# Patient Record
Sex: Female | Born: 1950 | Race: White | Hispanic: No | Smoking: Current every day smoker
Health system: Southern US, Community
[De-identification: ages and names within clinical notes are randomized; demographics above are authoritative.]

## PROBLEM LIST (undated history)

## (undated) DIAGNOSIS — R911 Solitary pulmonary nodule: Secondary | ICD-10-CM

## (undated) DIAGNOSIS — F419 Anxiety disorder, unspecified: Secondary | ICD-10-CM

## (undated) DIAGNOSIS — D509 Iron deficiency anemia, unspecified: Secondary | ICD-10-CM

## (undated) DIAGNOSIS — T7840XA Allergy, unspecified, initial encounter: Secondary | ICD-10-CM

## (undated) DIAGNOSIS — I10 Essential (primary) hypertension: Secondary | ICD-10-CM

## (undated) DIAGNOSIS — E785 Hyperlipidemia, unspecified: Secondary | ICD-10-CM

## (undated) DIAGNOSIS — R9431 Abnormal electrocardiogram [ECG] [EKG]: Secondary | ICD-10-CM

## (undated) DIAGNOSIS — F32A Depression, unspecified: Secondary | ICD-10-CM

## (undated) DIAGNOSIS — I4891 Unspecified atrial fibrillation: Secondary | ICD-10-CM

## (undated) DIAGNOSIS — F329 Major depressive disorder, single episode, unspecified: Secondary | ICD-10-CM

## (undated) DIAGNOSIS — D1730 Benign lipomatous neoplasm of skin and subcutaneous tissue of unspecified sites: Secondary | ICD-10-CM

## (undated) DIAGNOSIS — E538 Deficiency of other specified B group vitamins: Secondary | ICD-10-CM

## (undated) DIAGNOSIS — J432 Centrilobular emphysema: Secondary | ICD-10-CM

## (undated) DIAGNOSIS — J449 Chronic obstructive pulmonary disease, unspecified: Secondary | ICD-10-CM

## (undated) DIAGNOSIS — G5600 Carpal tunnel syndrome, unspecified upper limb: Secondary | ICD-10-CM

## (undated) DIAGNOSIS — Z1379 Encounter for other screening for genetic and chromosomal anomalies: Secondary | ICD-10-CM

## (undated) DIAGNOSIS — F1910 Other psychoactive substance abuse, uncomplicated: Secondary | ICD-10-CM

## (undated) DIAGNOSIS — C801 Malignant (primary) neoplasm, unspecified: Secondary | ICD-10-CM

## (undated) DIAGNOSIS — D649 Anemia, unspecified: Secondary | ICD-10-CM

## (undated) HISTORY — DX: Allergy, unspecified, initial encounter: T78.40XA

## (undated) HISTORY — DX: Abnormal electrocardiogram (ECG) (EKG): R94.31

## (undated) HISTORY — DX: Carpal tunnel syndrome, unspecified upper limb: G56.00

## (undated) HISTORY — DX: Chronic obstructive pulmonary disease, unspecified: J44.9

## (undated) HISTORY — DX: Deficiency of other specified B group vitamins: E53.8

## (undated) HISTORY — DX: Other psychoactive substance abuse, uncomplicated: F19.10

## (undated) HISTORY — PX: TUBAL LIGATION: SHX77

## (undated) HISTORY — DX: Iron deficiency anemia, unspecified: D50.9

## (undated) HISTORY — DX: Essential (primary) hypertension: I10

## (undated) HISTORY — PX: ABDOMINAL HYSTERECTOMY: SHX81

## (undated) HISTORY — DX: Encounter for other screening for genetic and chromosomal anomalies: Z13.79

## (undated) HISTORY — DX: Benign lipomatous neoplasm of skin and subcutaneous tissue of unspecified sites: D17.30

## (undated) HISTORY — DX: Anxiety disorder, unspecified: F41.9

## (undated) HISTORY — DX: Hyperlipidemia, unspecified: E78.5

## (undated) HISTORY — DX: Depression, unspecified: F32.A

## (undated) HISTORY — DX: Anemia, unspecified: D64.9

## (undated) HISTORY — DX: Major depressive disorder, single episode, unspecified: F32.9

## (undated) HISTORY — PX: BREAST EXCISIONAL BIOPSY: SUR124

---

## 2003-06-14 DIAGNOSIS — I10 Essential (primary) hypertension: Secondary | ICD-10-CM | POA: Insufficient documentation

## 2006-07-24 DIAGNOSIS — R0602 Shortness of breath: Secondary | ICD-10-CM | POA: Insufficient documentation

## 2009-08-25 DIAGNOSIS — F411 Generalized anxiety disorder: Secondary | ICD-10-CM | POA: Insufficient documentation

## 2009-08-25 DIAGNOSIS — F331 Major depressive disorder, recurrent, moderate: Secondary | ICD-10-CM | POA: Insufficient documentation

## 2010-10-11 DIAGNOSIS — D681 Hereditary factor XI deficiency: Secondary | ICD-10-CM | POA: Insufficient documentation

## 2010-10-11 DIAGNOSIS — K3189 Other diseases of stomach and duodenum: Secondary | ICD-10-CM | POA: Insufficient documentation

## 2010-10-11 DIAGNOSIS — F17200 Nicotine dependence, unspecified, uncomplicated: Secondary | ICD-10-CM | POA: Insufficient documentation

## 2010-10-11 DIAGNOSIS — E782 Mixed hyperlipidemia: Secondary | ICD-10-CM | POA: Insufficient documentation

## 2010-10-11 DIAGNOSIS — I10 Essential (primary) hypertension: Secondary | ICD-10-CM | POA: Insufficient documentation

## 2011-02-15 DIAGNOSIS — E538 Deficiency of other specified B group vitamins: Secondary | ICD-10-CM | POA: Insufficient documentation

## 2011-12-26 DIAGNOSIS — D1730 Benign lipomatous neoplasm of skin and subcutaneous tissue of unspecified sites: Secondary | ICD-10-CM | POA: Insufficient documentation

## 2012-02-13 DIAGNOSIS — J329 Chronic sinusitis, unspecified: Secondary | ICD-10-CM | POA: Insufficient documentation

## 2013-03-23 DIAGNOSIS — S42209A Unspecified fracture of upper end of unspecified humerus, initial encounter for closed fracture: Secondary | ICD-10-CM | POA: Insufficient documentation

## 2014-05-06 ENCOUNTER — Emergency Department: Payer: Self-pay | Admitting: Student

## 2014-05-06 LAB — CBC
HCT: 40 % (ref 35.0–47.0)
HGB: 13.2 g/dL (ref 12.0–16.0)
MCH: 27.6 pg (ref 26.0–34.0)
MCHC: 32.9 g/dL (ref 32.0–36.0)
MCV: 84 fL (ref 80–100)
Platelet: 331 10*3/uL (ref 150–440)
RBC: 4.77 10*6/uL (ref 3.80–5.20)
RDW: 13.4 % (ref 11.5–14.5)
WBC: 9.4 10*3/uL (ref 3.6–11.0)

## 2014-05-06 LAB — BASIC METABOLIC PANEL
Anion Gap: 8 (ref 7–16)
BUN: 6 mg/dL — AB (ref 7–18)
Calcium, Total: 8.8 mg/dL (ref 8.5–10.1)
Chloride: 105 mmol/L (ref 98–107)
Co2: 27 mmol/L (ref 21–32)
Creatinine: 0.79 mg/dL (ref 0.60–1.30)
Glucose: 101 mg/dL — ABNORMAL HIGH (ref 65–99)
OSMOLALITY: 277 (ref 275–301)
Potassium: 3.5 mmol/L (ref 3.5–5.1)
Sodium: 140 mmol/L (ref 136–145)

## 2014-05-06 LAB — URINALYSIS, COMPLETE
BLOOD: NEGATIVE
Bilirubin,UR: NEGATIVE
Glucose,UR: NEGATIVE mg/dL (ref 0–75)
Hyaline Cast: 20
NITRITE: NEGATIVE
Ph: 6 (ref 4.5–8.0)
Protein: 100
RBC,UR: 7 /HPF (ref 0–5)
SPECIFIC GRAVITY: 1.024 (ref 1.003–1.030)

## 2014-05-06 LAB — TROPONIN I: Troponin-I: <0.02 ng/mL

## 2015-05-17 DIAGNOSIS — R200 Anesthesia of skin: Secondary | ICD-10-CM | POA: Insufficient documentation

## 2015-05-27 DIAGNOSIS — G5603 Carpal tunnel syndrome, bilateral upper limbs: Secondary | ICD-10-CM | POA: Insufficient documentation

## 2015-07-28 DIAGNOSIS — R9431 Abnormal electrocardiogram [ECG] [EKG]: Secondary | ICD-10-CM | POA: Insufficient documentation

## 2016-04-13 ENCOUNTER — Other Ambulatory Visit: Payer: Self-pay | Admitting: Family Medicine

## 2016-04-13 DIAGNOSIS — E041 Nontoxic single thyroid nodule: Secondary | ICD-10-CM

## 2016-04-18 ENCOUNTER — Ambulatory Visit: Payer: Medicare Other

## 2016-04-25 ENCOUNTER — Ambulatory Visit
Admission: RE | Admit: 2016-04-25 | Discharge: 2016-04-25 | Disposition: A | Discharge status: Home / Self Care | Payer: Medicare Other | Source: Ambulatory Visit | Attending: Family Medicine | Admitting: Family Medicine

## 2016-04-25 DIAGNOSIS — Z8639 Personal history of other endocrine, nutritional and metabolic disease: Secondary | ICD-10-CM | POA: Diagnosis present

## 2016-04-25 DIAGNOSIS — E041 Nontoxic single thyroid nodule: Secondary | ICD-10-CM | POA: Insufficient documentation

## 2016-05-29 ENCOUNTER — Ambulatory Visit: Payer: Self-pay | Admitting: Family Medicine

## 2016-08-27 ENCOUNTER — Emergency Department
Admission: EM | Admit: 2016-08-27 | Discharge: 2016-08-27 | Discharge status: Absent without leave | Payer: Medicare Other

## 2016-09-24 DIAGNOSIS — F331 Major depressive disorder, recurrent, moderate: Secondary | ICD-10-CM | POA: Insufficient documentation

## 2016-10-16 ENCOUNTER — Inpatient Hospital Stay: Payer: Medicare Other | Attending: Oncology | Admitting: Oncology

## 2016-10-16 ENCOUNTER — Inpatient Hospital Stay: Payer: Medicare Other

## 2016-10-16 ENCOUNTER — Encounter: Payer: Self-pay | Admitting: Oncology

## 2016-10-16 VITALS — BP 152/78 | HR 98 | Temp 97.6°F | Wt 165.0 lb

## 2016-10-16 DIAGNOSIS — D649 Anemia, unspecified: Secondary | ICD-10-CM

## 2016-10-16 DIAGNOSIS — Z8041 Family history of malignant neoplasm of ovary: Secondary | ICD-10-CM

## 2016-10-16 DIAGNOSIS — E785 Hyperlipidemia, unspecified: Secondary | ICD-10-CM | POA: Insufficient documentation

## 2016-10-16 DIAGNOSIS — I10 Essential (primary) hypertension: Secondary | ICD-10-CM | POA: Diagnosis not present

## 2016-10-16 DIAGNOSIS — E538 Deficiency of other specified B group vitamins: Secondary | ICD-10-CM | POA: Insufficient documentation

## 2016-10-16 DIAGNOSIS — J449 Chronic obstructive pulmonary disease, unspecified: Secondary | ICD-10-CM

## 2016-10-16 DIAGNOSIS — J432 Centrilobular emphysema: Secondary | ICD-10-CM | POA: Insufficient documentation

## 2016-10-16 DIAGNOSIS — F419 Anxiety disorder, unspecified: Secondary | ICD-10-CM | POA: Insufficient documentation

## 2016-10-16 DIAGNOSIS — F329 Major depressive disorder, single episode, unspecified: Secondary | ICD-10-CM | POA: Insufficient documentation

## 2016-10-16 DIAGNOSIS — R5383 Other fatigue: Secondary | ICD-10-CM | POA: Diagnosis not present

## 2016-10-16 DIAGNOSIS — D509 Iron deficiency anemia, unspecified: Secondary | ICD-10-CM | POA: Diagnosis not present

## 2016-10-16 DIAGNOSIS — G5600 Carpal tunnel syndrome, unspecified upper limb: Secondary | ICD-10-CM | POA: Diagnosis not present

## 2016-10-16 DIAGNOSIS — Z803 Family history of malignant neoplasm of breast: Secondary | ICD-10-CM

## 2016-10-16 DIAGNOSIS — J439 Emphysema, unspecified: Secondary | ICD-10-CM | POA: Insufficient documentation

## 2016-10-16 DIAGNOSIS — Z129 Encounter for screening for malignant neoplasm, site unspecified: Secondary | ICD-10-CM

## 2016-10-16 DIAGNOSIS — Z5181 Encounter for therapeutic drug level monitoring: Secondary | ICD-10-CM

## 2016-10-16 DIAGNOSIS — Z79899 Other long term (current) drug therapy: Secondary | ICD-10-CM | POA: Insufficient documentation

## 2016-10-16 DIAGNOSIS — Z789 Other specified health status: Secondary | ICD-10-CM

## 2016-10-16 DIAGNOSIS — F17210 Nicotine dependence, cigarettes, uncomplicated: Secondary | ICD-10-CM | POA: Insufficient documentation

## 2016-10-16 HISTORY — DX: Anemia, unspecified: D64.9

## 2016-10-16 HISTORY — DX: Iron deficiency anemia, unspecified: D50.9

## 2016-10-16 LAB — COMPREHENSIVE METABOLIC PANEL
ALT: 14 U/L (ref 14–54)
AST: 20 U/L (ref 15–41)
Albumin: 4.4 g/dL (ref 3.5–5.0)
Alkaline Phosphatase: 83 U/L (ref 38–126)
Anion gap: 7 (ref 5–15)
BILIRUBIN TOTAL: 0.6 mg/dL (ref 0.3–1.2)
BUN: 10 mg/dL (ref 6–20)
CALCIUM: 9.5 mg/dL (ref 8.9–10.3)
CO2: 27 mmol/L (ref 22–32)
CREATININE: 0.78 mg/dL (ref 0.44–1.00)
Chloride: 103 mmol/L (ref 101–111)
GFR calc Af Amer: >60 mL/min (ref >60)
Glucose, Bld: 102 mg/dL — ABNORMAL HIGH (ref 65–99)
Potassium: 3.9 mmol/L (ref 3.5–5.1)
Sodium: 137 mmol/L (ref 135–145)
TOTAL PROTEIN: 7.9 g/dL (ref 6.5–8.1)

## 2016-10-16 LAB — CBC WITH DIFFERENTIAL/PLATELET
BASOS PCT: 1 %
Basophils Absolute: 0.1 10*3/uL (ref 0–0.1)
EOS ABS: 0.1 10*3/uL (ref 0–0.7)
Eosinophils Relative: 1 %
HEMATOCRIT: 36.7 % (ref 35.0–47.0)
HEMOGLOBIN: 12.4 g/dL (ref 12.0–16.0)
Lymphocytes Relative: 23 %
Lymphs Abs: 2.1 10*3/uL (ref 1.0–3.6)
MCH: 27.1 pg (ref 26.0–34.0)
MCHC: 33.9 g/dL (ref 32.0–36.0)
MCV: 79.9 fL — ABNORMAL LOW (ref 80.0–100.0)
MONOS PCT: 8 %
Monocytes Absolute: 0.7 10*3/uL (ref 0.2–0.9)
NEUTROS ABS: 5.9 10*3/uL (ref 1.4–6.5)
NEUTROS PCT: 67 %
Platelets: 351 10*3/uL (ref 150–440)
RBC: 4.59 MIL/uL (ref 3.80–5.20)
RDW: 14.9 % — ABNORMAL HIGH (ref 11.5–14.5)
WBC: 8.8 10*3/uL (ref 3.6–11.0)

## 2016-10-16 LAB — FERRITIN: Ferritin: 12 ng/mL (ref 11–307)

## 2016-10-16 LAB — IRON AND TIBC
IRON: 49 ug/dL (ref 28–170)
Saturation Ratios: 12 % (ref 10.4–31.8)
TIBC: 412 ug/dL (ref 250–450)
UIBC: 363 ug/dL

## 2016-10-16 NOTE — Progress Notes (Addendum)
[facility]  Hematology/Oncology Consult note [facility] Telephone:([phone] [phone]  Patient Care Team: Cecile Sheerer, MD as PCP - General (Family Medicine)  CHIEF COMPLAINTS/PURPOSE OF CONSULTATION: I have low hemoglobin and low iron level.    No history exists.    HISTORY OF PRESENTING ILLNESS: Elizabeth [name] 66 y.o. female PMH HLD, COPD, vitamin B12 deficiency, depression who is referred by Dr.Shapely-Quinn for evaluation and treatment of iron deficiency anemia. Patient reports that 3 months ago she felt "sick" and she was told that her blood counts were low and later she was informed that her iron levels are low too. She started taking over-the-counter iron supplementation for a few months. Patient's primary care doctor recently checked her CBC and iron test. Lab reports from [facility] showed WBC 9.6 hemoglobin 11.4 platelet 330, MCV 83. Iron 25 TIBC 382, saturation 7% ferritin 9. TSH 1.85 B12 631. She also had stool occult done in May 2018 which were negative. The patient denies any change of bowel habits black stool or blood in the stool. She denies any weight loss fever or night sweats. She feels weak and lack of exercise endurance.  Patient reports that she has a long history of vitamin B12 deficiency. She gets parenteral vitamin shots periodically, managed by PCP and was given by her daughter.  Patient reports that she has had hysterectomy at age of 16. She hasn't had her colonoscopy screening done yet. She denies any bleeding events though she noted easy bruising. She denies any symptoms. He reports that her mother passed away at age of 32 with ovarian cancer and for some unknown reason autopsy was requested at that time and she was told her mom also had breast cancer. Denies any other family cancer history. She is a active smoker, she denies alcohol use. She consume vegetarian diet and brew and drink tea every  day. The  Review of Systems  Constitutional: Positive for fatigue.  HENT:  Negative.   Eyes: Negative.   Respiratory: Negative.   Cardiovascular: Negative.   Gastrointestinal: Negative.   Endocrine: Negative.   Genitourinary: Negative.    Musculoskeletal: Negative.   Skin: Negative.   Neurological: Negative.   Hematological: Negative.   Psychiatric/Behavioral: Positive for depression.    MEDICAL HISTORY: Past Medical History:  Diagnosis Date  . Allergy   . Anxiety   . B12 deficiency   . Carpal tunnel syndrome   . COPD (chronic obstructive pulmonary disease) ([place])   . Depression   . Hyperlipidemia   . Hypertension   . Iron deficiency anemia 10/16/2016  . Lipoma of skin   . Normocytic anemia 10/16/2016  . QT prolongation   . Substance abuse     SURGICAL HISTORY: Past Surgical History:  Procedure Laterality Date  . ABDOMINAL HYSTERECTOMY      SOCIAL HISTORY: Social History   Social History  . Marital status: Unknown    Spouse name: N/A  . Number of children: N/A  . Years of education: N/A   Occupational History  . Not on file.   Social History Main Topics  . Smoking status: Current Every Day Smoker    Packs/day: 1.00    Years: 50.00    Types: Cigarettes  . Smokeless tobacco: Never Used  . Alcohol use No  . Drug use: No  . Sexual activity: Not Currently   Other Topics Concern  . Not on file   Social History Narrative  . No narrative on file  FAMILY HISTORY Family History  Problem Relation Age of Onset  . Ovarian cancer Mother   . Breast cancer Mother   . Stroke Father     ALLERGIES:  has no allergies on file.  MEDICATIONS:  Current Outpatient Prescriptions  Medication Sig Dispense Refill  . albuterol (PROVENTIL HFA;VENTOLIN HFA) 108 (90 Base) MCG/ACT inhaler Inhale into the lungs.    Marland Kitchen albuterol (PROVENTIL) (2.5 MG/3ML) 0.083% nebulizer solution Inhale into the lungs.    Marland Kitchen amLODipine (NORVASC) 10 MG tablet take 1 tablet by mouth once  daily    . budesonide-formoterol (SYMBICORT) 160-4.5 MCG/ACT inhaler Inhale into the lungs.    . clonazePAM (KLONOPIN) 0.5 MG tablet Take 0.5 mg by mouth.    . cyanocobalamin (,VITAMIN B-12,) 1000 MCG/ML injection Inject into the muscle.    . fluticasone (FLONASE) 50 MCG/ACT nasal spray   1  . lisinopril (PRINIVIL,ZESTRIL) 10 MG tablet Take by mouth.    . Multiple Vitamin (MULTI-VITAMINS) TABS Take by mouth.    Marland Kitchen omeprazole (PRILOSEC) 20 MG capsule Take by mouth.    . QUEtiapine (SEROQUEL) 25 MG tablet Take by mouth.    . sertraline (ZOLOFT) 100 MG tablet Take by mouth.    . simvastatin (ZOCOR) 80 MG tablet Take by mouth.    . spironolactone (ALDACTONE) 25 MG tablet Take by mouth.    . tiotropium (SPIRIVA HANDIHALER) 18 MCG inhalation capsule inhale contents of 1 capsule by mouth once daily    . traZODone (DESYREL) 50 MG tablet Take by mouth.     No current facility-administered medications for this visit.     PHYSICAL EXAMINATION:  ECOG PERFORMANCE STATUS: 1 - Symptomatic but completely ambulatory  Vitals:   10/16/16 0944  BP: (!) 152/78  Pulse: 98  Temp: 97.6 F (36.4 C)   Filed Weights   10/16/16 0944  Weight: 165 lb (74.8 kg)     Physical Exam  GENERAL: No distress, well nourished, obese.  SKIN:  No rashes or significant lesions  HEAD: Normocephalic, No masses, lesions, tenderness or abnormalities  EYES: Conjunctiva are pink and non-injected  ENT: External ears normal ,lips , buccal mucosa, and tongue normal and mucous membranes are moist  LYMPH: No palpable lymphadenopathy  BREAST:Normal without mass, skin or nipple changes or axillary nodes,  LUNGS: Clear to auscultation, no crackles or wheezes HEART: Regular rate & rhythm, no murmurs, no gallops, S1 normal and S2 normal  ABDOMEN: Abdomen soft, obese, non-tender, normal bowel sounds, no masses or organomegaly and no hepatosplenomegaly  MSK: No CVA tenderness and no tenderness on percussion of the back or rib  cage. EXTREMITIES: No edema, no skin discoloration or tenderness NEURO: Alert & oriented, no focal motor/sensory deficits.   LABORATORY DATA: I have personally reviewed the data as listed: Lab reports from [facility] showed WBC 9.6 hemoglobin 11.4 platelet 330, MCV 83. Iron 25 TIBC 382, saturation 7% ferritin 9. TSH 1.85 B12 631. She also had stool occult done in May 2018 which were negative  Appointment on 10/16/2016  Component Date Value Ref Range Status  . WBC 10/16/2016 8.8  3.6 - 11.0 K/uL Final  . RBC 10/16/2016 4.59  3.80 - 5.20 MIL/uL Final  . Hemoglobin 10/16/2016 12.4  12.0 - 16.0 g/dL Final  . HCT 10/16/2016 36.7  35.0 - 47.0 % Final  . MCV 10/16/2016 79.9* 80.0 - 100.0 fL Final  . MCH 10/16/2016 27.1  26.0 - 34.0 pg Final  . MCHC 10/16/2016 33.9  32.0 - 36.0 g/dL  Final  . RDW 10/16/2016 14.9* 11.5 - 14.5 % Final  . Platelets 10/16/2016 351  150 - 440 K/uL Final  . Neutrophils Relative % 10/16/2016 67  % Final  . Neutro Abs 10/16/2016 5.9  1.4 - 6.5 K/uL Final  . Lymphocytes Relative 10/16/2016 23  % Final  . Lymphs Abs 10/16/2016 2.1  1.0 - 3.6 K/uL Final  . Monocytes Relative 10/16/2016 8  % Final  . Monocytes Absolute 10/16/2016 0.7  0.2 - 0.9 K/uL Final  . Eosinophils Relative 10/16/2016 1  % Final  . Eosinophils Absolute 10/16/2016 0.1  0 - 0.7 K/uL Final  . Basophils Relative 10/16/2016 1  % Final  . Basophils Absolute 10/16/2016 0.1  0 - 0.1 K/uL Final    ASSESSMENT/PLAN  # Microcytic Anemia # Iron deficiency anemia  # History of vitamin B12 deficiency # Fatigue # Vegetarian diet # Chronic proton pump inhibitor use.  # Family history of ovarian and breast cancer # Age appropriate cancer screen  Plan:  # Discussed with patient about her iron deficiency anemia diagnosis and options of treatment, including oral prescription strength iron supplement which may take a few months to replete her iron store or IV iron infusion. Patient prefers IV iron infusion as  she feels very fatigue and hope her iron store can be replete fast. She is made aware of the rare but severe allergic reactions of intravenous iron supplementation and is willing to proceed.  I feel this is a reasonable choice given that she has tried over the counter iron supplementations without any improvement, and her chronic PPI use and daily tea consumption will definitely interfere the consumption of the iron.  # She also appears to have long standing B12 deficiency which requires B12 shots periodically. Most recent B12 level is normal. Will check anti parietal antibody, and intrinsic factor antibody to rule out pernicious anemia.  Check CMP as well.  # I discussed with patient regarding the importance of colonoscopy, especially now she shows signs of iron deficiency, colon cancer or other etiology for chronic blood loss need to be ruled out. Patient reports her hesitancy due to a bad experience from her co worker. Patient also reports that her PCP has also discussed with her about the necessarily of colonoscopy and she has declined. I offer to refer to a GI specialist and have a discussion about the procedure risk and benefit of colonoscopy. Patient prefers to revisit this topic at next visit.  # Her family history revealed both ovarian and breast cancer, per patient no one else in the family has cancer. No genetic test was done. She will also need mammogram screen. At this point, she is quite overwhelmed with the information. Will continue counseling at next visit.  Plan IV iron supplement with Feraheme for 2 doses.  Return of visit in 6 weeks with CBC, iron panel repeated a few days before clinic.   Orders Placed This Encounter  Procedures  . Comprehensive metabolic panel    Standing Status:   Future    Number of Occurrences:   1    Standing Expiration Date:   10/16/2017  . Anti-parietal antibody    Standing Status:   Future    Number of Occurrences:   1    Standing Expiration Date:    10/16/2017  . Intrinsic Factor Antibodies  . Iron and TIBC    Standing Status:   Future    Number of Occurrences:   1    Standing  Expiration Date:   10/16/2017  . Ferritin    Standing Status:   Future    Number of Occurrences:   1    Standing Expiration Date:   10/16/2017  . CBC with Differential/Platelet    Standing Status:   Future    Number of Occurrences:   1    Standing Expiration Date:   10/16/2017  . Intrinsic Factor Antibodies    Standing Status:   Future    Number of Occurrences:   1    Standing Expiration Date:   10/16/2017    All questions were answered. The patient knows to call the clinic with any problems, questions or concerns.  Thank you for this kind referral and the opportunity to participate in the care of this patient.   Total face to face encounter time for this patient visit was 60 min. >50% of the time was  spent in counseling and coordination of care.   Earlie Server MD PhD [facility] [phone] Phone: [phone] 10/15/2016

## 2016-10-16 NOTE — Addendum Note (Signed)
Addended by: Earlie Server on: 10/16/2016 01:47 PM   Modules accepted: Miquel Dunn

## 2016-10-17 LAB — INTRINSIC FACTOR ANTIBODIES
INTRINSIC FACTOR: 1 [AU]/ml (ref 0.0–1.1)
Intrinsic Factor: 1.1 AU/mL (ref 0.0–1.1)

## 2016-10-19 ENCOUNTER — Inpatient Hospital Stay: Payer: Medicare Other

## 2016-10-19 VITALS — BP 150/69 | HR 92

## 2016-10-19 DIAGNOSIS — D509 Iron deficiency anemia, unspecified: Secondary | ICD-10-CM | POA: Diagnosis not present

## 2016-10-19 LAB — ANTI-PARIETAL ANTIBODY: Parietal Cell Antibody-IgG: 23.4 Units — ABNORMAL HIGH (ref 0.0–20.0)

## 2016-10-19 MED ORDER — SODIUM CHLORIDE 0.9 % IV SOLN
510.0000 mg | Freq: Once | INTRAVENOUS | Status: AC
  Administered 2016-10-19: 510 mg via INTRAVENOUS
  Filled 2016-10-19: qty 17

## 2016-10-19 MED ORDER — SODIUM CHLORIDE 0.9 % IV SOLN
Freq: Once | INTRAVENOUS | Status: AC
  Administered 2016-10-19: 14:00:00 via INTRAVENOUS
  Filled 2016-10-19: qty 1000

## 2016-10-20 ENCOUNTER — Encounter: Payer: Self-pay | Admitting: Oncology

## 2016-10-23 ENCOUNTER — Telehealth: Payer: Self-pay | Admitting: *Deleted

## 2016-10-23 NOTE — Telephone Encounter (Signed)
Just look her up. She only has one IV iron scheduled. There was miscommunication between me and the scheduler. Can you tell patient that she will be scheduled for a second dose of IV iron and also she does not need to take oral iron. Thank you.

## 2016-10-23 NOTE — Telephone Encounter (Signed)
Patient advised to stop OTC oral iron and that she will be getting a call form scheduler to get her second iron infusion

## 2016-10-23 NOTE — Telephone Encounter (Signed)
Patient called inquiring if she is to continue OTC oral iron, also asking if she is to only get 1 dose of IV iron. She is not scheduled for a second dose, Per office note she is to get 2 doses of IV iron. The check out note did not say to schedule her for a second infusion. Please advise

## 2016-10-26 ENCOUNTER — Inpatient Hospital Stay: Payer: Medicare Other

## 2016-10-26 VITALS — BP 115/73 | HR 84 | Temp 98.4°F | Resp 20

## 2016-10-26 DIAGNOSIS — D509 Iron deficiency anemia, unspecified: Secondary | ICD-10-CM

## 2016-10-26 MED ORDER — SODIUM CHLORIDE 0.9 % IV SOLN
Freq: Once | INTRAVENOUS | Status: AC
  Administered 2016-10-26: 14:00:00 via INTRAVENOUS
  Filled 2016-10-26: qty 1000

## 2016-10-26 MED ORDER — SODIUM CHLORIDE 0.9 % IV SOLN
510.0000 mg | Freq: Once | INTRAVENOUS | Status: AC
  Administered 2016-10-26: 510 mg via INTRAVENOUS
  Filled 2016-10-26: qty 17

## 2016-11-23 ENCOUNTER — Inpatient Hospital Stay: Payer: Medicare Other | Attending: Oncology

## 2016-11-23 ENCOUNTER — Other Ambulatory Visit: Payer: Self-pay | Admitting: *Deleted

## 2016-11-23 ENCOUNTER — Encounter: Payer: Self-pay | Admitting: Oncology

## 2016-11-23 DIAGNOSIS — I10 Essential (primary) hypertension: Secondary | ICD-10-CM | POA: Diagnosis not present

## 2016-11-23 DIAGNOSIS — F17210 Nicotine dependence, cigarettes, uncomplicated: Secondary | ICD-10-CM | POA: Diagnosis not present

## 2016-11-23 DIAGNOSIS — J449 Chronic obstructive pulmonary disease, unspecified: Secondary | ICD-10-CM | POA: Diagnosis not present

## 2016-11-23 DIAGNOSIS — K219 Gastro-esophageal reflux disease without esophagitis: Secondary | ICD-10-CM | POA: Insufficient documentation

## 2016-11-23 DIAGNOSIS — Z79899 Other long term (current) drug therapy: Secondary | ICD-10-CM | POA: Diagnosis not present

## 2016-11-23 DIAGNOSIS — Z8041 Family history of malignant neoplasm of ovary: Secondary | ICD-10-CM | POA: Insufficient documentation

## 2016-11-23 DIAGNOSIS — F419 Anxiety disorder, unspecified: Secondary | ICD-10-CM | POA: Insufficient documentation

## 2016-11-23 DIAGNOSIS — E785 Hyperlipidemia, unspecified: Secondary | ICD-10-CM | POA: Diagnosis not present

## 2016-11-23 DIAGNOSIS — E538 Deficiency of other specified B group vitamins: Secondary | ICD-10-CM

## 2016-11-23 DIAGNOSIS — Z803 Family history of malignant neoplasm of breast: Secondary | ICD-10-CM | POA: Diagnosis not present

## 2016-11-23 DIAGNOSIS — D509 Iron deficiency anemia, unspecified: Secondary | ICD-10-CM | POA: Insufficient documentation

## 2016-11-23 DIAGNOSIS — G5600 Carpal tunnel syndrome, unspecified upper limb: Secondary | ICD-10-CM | POA: Diagnosis not present

## 2016-11-23 DIAGNOSIS — F329 Major depressive disorder, single episode, unspecified: Secondary | ICD-10-CM | POA: Insufficient documentation

## 2016-11-23 DIAGNOSIS — D508 Other iron deficiency anemias: Secondary | ICD-10-CM

## 2016-11-23 LAB — CBC WITH DIFFERENTIAL/PLATELET
BASOS ABS: 0 10*3/uL (ref 0–0.1)
BASOS PCT: 1 %
EOS ABS: 0.1 10*3/uL (ref 0–0.7)
Eosinophils Relative: 2 %
HEMATOCRIT: 38.1 % (ref 35.0–47.0)
HEMOGLOBIN: 13.1 g/dL (ref 12.0–16.0)
Lymphocytes Relative: 32 %
Lymphs Abs: 2.2 10*3/uL (ref 1.0–3.6)
MCH: 27.9 pg (ref 26.0–34.0)
MCHC: 34.4 g/dL (ref 32.0–36.0)
MCV: 81.1 fL (ref 80.0–100.0)
Monocytes Absolute: 0.6 10*3/uL (ref 0.2–0.9)
Monocytes Relative: 8 %
NEUTROS ABS: 4 10*3/uL (ref 1.4–6.5)
NEUTROS PCT: 57 %
Platelets: 280 10*3/uL (ref 150–440)
RBC: 4.69 MIL/uL (ref 3.80–5.20)
RDW: 16.4 % — ABNORMAL HIGH (ref 11.5–14.5)
WBC: 6.9 10*3/uL (ref 3.6–11.0)

## 2016-11-23 LAB — IRON AND TIBC
IRON: 74 ug/dL (ref 28–170)
SATURATION RATIOS: 30 % (ref 10.4–31.8)
TIBC: 249 ug/dL — AB (ref 250–450)
UIBC: 175 ug/dL

## 2016-11-23 LAB — VITAMIN B12: VITAMIN B 12: 673 pg/mL (ref 180–914)

## 2016-11-23 LAB — FERRITIN: FERRITIN: 305 ng/mL (ref 11–307)

## 2016-11-27 ENCOUNTER — Encounter: Payer: Self-pay | Admitting: Oncology

## 2016-11-27 ENCOUNTER — Inpatient Hospital Stay: Payer: Medicare Other

## 2016-11-27 ENCOUNTER — Other Ambulatory Visit: Payer: Medicare Other

## 2016-11-27 ENCOUNTER — Other Ambulatory Visit: Payer: Self-pay | Admitting: *Deleted

## 2016-11-27 ENCOUNTER — Inpatient Hospital Stay (HOSPITAL_BASED_OUTPATIENT_CLINIC_OR_DEPARTMENT_OTHER): Payer: Medicare Other | Admitting: Oncology

## 2016-11-27 VITALS — BP 121/66 | HR 86 | Temp 98.0°F | Wt 165.0 lb

## 2016-11-27 DIAGNOSIS — Z803 Family history of malignant neoplasm of breast: Secondary | ICD-10-CM

## 2016-11-27 DIAGNOSIS — K219 Gastro-esophageal reflux disease without esophagitis: Secondary | ICD-10-CM | POA: Diagnosis not present

## 2016-11-27 DIAGNOSIS — Z8041 Family history of malignant neoplasm of ovary: Secondary | ICD-10-CM

## 2016-11-27 DIAGNOSIS — D509 Iron deficiency anemia, unspecified: Secondary | ICD-10-CM

## 2016-11-27 DIAGNOSIS — Z79899 Other long term (current) drug therapy: Secondary | ICD-10-CM

## 2016-11-27 DIAGNOSIS — D510 Vitamin B12 deficiency anemia due to intrinsic factor deficiency: Secondary | ICD-10-CM | POA: Insufficient documentation

## 2016-11-27 DIAGNOSIS — F17210 Nicotine dependence, cigarettes, uncomplicated: Secondary | ICD-10-CM

## 2016-11-27 DIAGNOSIS — E538 Deficiency of other specified B group vitamins: Secondary | ICD-10-CM | POA: Diagnosis not present

## 2016-11-27 DIAGNOSIS — Z129 Encounter for screening for malignant neoplasm, site unspecified: Secondary | ICD-10-CM

## 2016-11-27 LAB — URINALYSIS, COMPLETE (UACMP) WITH MICROSCOPIC
BACTERIA UA: NONE SEEN
Bilirubin Urine: NEGATIVE
Glucose, UA: NEGATIVE mg/dL
Hgb urine dipstick: NEGATIVE
Ketones, ur: NEGATIVE mg/dL
Leukocytes, UA: NEGATIVE
NITRITE: NEGATIVE
Protein, ur: NEGATIVE mg/dL
SPECIFIC GRAVITY, URINE: 1.019 (ref 1.005–1.030)
pH: 6 (ref 5.0–8.0)

## 2016-11-27 NOTE — Progress Notes (Signed)
Patient here today for follow up.   

## 2016-11-27 NOTE — Progress Notes (Signed)
[facility]  Hematology/Oncology Consult note [facility] Telephone:([phone] [phone]  Patient Care Team: Cecile Sheerer, MD as PCP - General (Family Medicine)  CHIEF COMPLAINTS/PURPOSE OF CONSULTATION: I have low hemoglobin and low iron level.    No history exists.    HISTORY OF PRESENTING ILLNESS: Elizabeth [name] 66 y.o. female PMH HLD, COPD, vitamin B12 deficiency, depression who is referred by Dr.Shapely-Quinn for evaluation and treatment of iron deficiency anemia. Patient reports that 3 months ago she felt "sick" and she was told that her blood counts were low and later she was informed that her iron levels are low too. She started taking over-the-counter iron supplementation for a few months. Patient's primary care doctor recently checked her CBC and iron test. Lab reports from [facility] showed WBC 9.6 hemoglobin 11.4 platelet 330, MCV 83. Iron 25 TIBC 382, saturation 7% ferritin 9. TSH 1.85 B12 631. She also had stool occult done in May 2018 which were negative. The patient denies any change of bowel habits black stool or blood in the stool. She denies any weight loss fever or night sweats. She feels weak and lack of exercise endurance.  Patient reports that she has a long history of vitamin B12 deficiency. She gets parenteral vitamin shots periodically, managed by PCP and was given by her daughter.  Patient reports that she has had hysterectomy at age of 43. She hasn't had her colonoscopy screening done yet. She denies any bleeding events though she noted easy bruising. She denies any symptoms. He reports that her mother passed away at age of 64 with ovarian cancer and for some unknown reason autopsy was requested at that time and she was told her mom also had breast cancer. Denies any other family cancer history. She is a active smoker, she denies alcohol use. She consume vegetarian diet and brew and drink tea every  day. The  INTERVAL HISTORY Patient presents to discuss about the results. No new complaints. During the interval she got IV Feraheme infusion tolerated well. She reports that her energy has much improved, and her fingernail changes have resolved.. She also has better mood  Review of Systems  HENT:  Negative.   Eyes: Negative.   Respiratory: Negative.   Cardiovascular: Negative.   Gastrointestinal: Negative.   Endocrine: Negative.   Genitourinary: Negative.    Musculoskeletal: Negative.   Skin: Negative.   Neurological: Negative.   Hematological: Negative.   Psychiatric/Behavioral: Negative.     MEDICAL HISTORY: Past Medical History:  Diagnosis Date  . Allergy   . Anxiety   . B12 deficiency   . Carpal tunnel syndrome   . COPD (chronic obstructive pulmonary disease) ([place])   . Depression   . Hyperlipidemia   . Hypertension   . Iron deficiency anemia 10/16/2016  . Lipoma of skin   . Normocytic anemia 10/16/2016  . QT prolongation   . Substance abuse     SURGICAL HISTORY: Past Surgical History:  Procedure Laterality Date  . ABDOMINAL HYSTERECTOMY    . BREAST EXCISIONAL BIOPSY Left   . CESAREAN SECTION    . TUBAL LIGATION      SOCIAL HISTORY: Social History   Social History  . Marital status: Unknown    Spouse name: N/A  . Number of children: N/A  . Years of education: N/A   Occupational History  . Not on file.   Social History Main Topics  . Smoking status: Current Every Day Smoker  Packs/day: 1.00    Years: 50.00    Types: Cigarettes  . Smokeless tobacco: Never Used  . Alcohol use No  . Drug use: No  . Sexual activity: Not Currently   Other Topics Concern  . Not on file   Social History Narrative  . No narrative on file    FAMILY HISTORY Family History  Problem Relation Age of Onset  . Ovarian cancer Mother   . Breast cancer Mother   . Stroke Father     ALLERGIES:  is allergic to levofloxacin; amoxicillin-pot clavulanate; and  tetracycline.  MEDICATIONS:  Current Outpatient Prescriptions  Medication Sig Dispense Refill  . albuterol (PROVENTIL HFA;VENTOLIN HFA) 108 (90 Base) MCG/ACT inhaler Inhale into the lungs.    Marland Kitchen albuterol (PROVENTIL) (2.5 MG/3ML) 0.083% nebulizer solution Inhale into the lungs.    Marland Kitchen amLODipine (NORVASC) 10 MG tablet take 1 tablet by mouth once daily    . budesonide-formoterol (SYMBICORT) 160-4.5 MCG/ACT inhaler Inhale into the lungs.    . cyanocobalamin (,VITAMIN B-12,) 1000 MCG/ML injection Inject into the muscle.    . erythromycin (ERY-TAB) 250 MG EC tablet Take 250 mg by mouth daily.    . fluticasone (FLONASE) 50 MCG/ACT nasal spray   1  . lisinopril (PRINIVIL,ZESTRIL) 10 MG tablet Take by mouth.    . Multiple Vitamin (MULTI-VITAMINS) TABS Take by mouth.    Marland Kitchen omeprazole (PRILOSEC) 20 MG capsule Take by mouth.    . sertraline (ZOLOFT) 100 MG tablet Take by mouth.    . simvastatin (ZOCOR) 80 MG tablet Take by mouth.    . spironolactone (ALDACTONE) 25 MG tablet Take by mouth.    . tiotropium (SPIRIVA HANDIHALER) 18 MCG inhalation capsule inhale contents of 1 capsule by mouth once daily     No current facility-administered medications for this visit.     PHYSICAL EXAMINATION:  ECOG PERFORMANCE STATUS: 1 - Symptomatic but completely ambulatory  Vitals:   11/27/16 1040  BP: 121/66  Pulse: 86  Temp: 98 F (36.7 C)  SpO2: 95%   Filed Weights   11/27/16 1040  Weight: 165 lb (74.8 kg)     Physical Exam  GENERAL: No distress, well nourished, obese.  SKIN:  No rashes or significant lesions  HEAD: Normocephalic, No masses, lesions, tenderness or abnormalities  EYES: Conjunctiva are pink and non-injected  ENT: External ears normal ,lips , buccal mucosa, and tongue normal and mucous membranes are moist  LYMPH: No palpable lymphadenopathy  BREAST:Normal without mass, skin or nipple changes or axillary nodes,  LUNGS: Clear to auscultation, no crackles or wheezes HEART: Regular rate  & rhythm, no murmurs, no gallops, S1 normal and S2 normal  ABDOMEN: Abdomen soft, obese, non-tender, normal bowel sounds, no masses or organomegaly and no hepatosplenomegaly  MSK: No CVA tenderness and no tenderness on percussion of the back or rib cage. EXTREMITIES: No edema, no skin discoloration or tenderness NEURO: Alert & oriented, no focal motor/sensory deficits.   LABORATORY DATA: I have personally reviewed the data as listed: Lab reports from [facility] showed WBC 9.6 hemoglobin 11.4 platelet 330, MCV 83. Iron 25 TIBC 382, saturation 7% ferritin 9. TSH 1.85 B12 631. She also had stool occult done in May 2018 which were negative  Orders Only on 11/23/2016  Component Date Value Ref Range Status  . WBC 11/23/2016 6.9  3.6 - 11.0 K/uL Final  . RBC 11/23/2016 4.69  3.80 - 5.20 MIL/uL Final  . Hemoglobin 11/23/2016 13.1  12.0 - 16.0 g/dL Final  .  HCT 11/23/2016 38.1  35.0 - 47.0 % Final  . MCV 11/23/2016 81.1  80.0 - 100.0 fL Final  . MCH 11/23/2016 27.9  26.0 - 34.0 pg Final  . MCHC 11/23/2016 34.4  32.0 - 36.0 g/dL Final  . RDW 11/23/2016 16.4* 11.5 - 14.5 % Final  . Platelets 11/23/2016 280  150 - 440 K/uL Final  . Neutrophils Relative % 11/23/2016 57  % Final  . Neutro Abs 11/23/2016 4.0  1.4 - 6.5 K/uL Final  . Lymphocytes Relative 11/23/2016 32  % Final  . Lymphs Abs 11/23/2016 2.2  1.0 - 3.6 K/uL Final  . Monocytes Relative 11/23/2016 8  % Final  . Monocytes Absolute 11/23/2016 0.6  0.2 - 0.9 K/uL Final  . Eosinophils Relative 11/23/2016 2  % Final  . Eosinophils Absolute 11/23/2016 0.1  0 - 0.7 K/uL Final  . Basophils Relative 11/23/2016 1  % Final  . Basophils Absolute 11/23/2016 0.0  0 - 0.1 K/uL Final  . Vitamin B-12 11/23/2016 673  180 - 914 pg/mL Final   Comment: (NOTE) This assay is not validated for testing neonatal or myeloproliferative syndrome specimens for Vitamin B12 levels. Performed at [facility], [place] [address] [place], [place] [address]   .  Ferritin 11/23/2016 305  11 - 307 ng/mL Final  . Iron 11/23/2016 74  28 - 170 ug/dL Final  . TIBC 11/23/2016 249* 250 - 450 ug/dL Final  . Saturation Ratios 11/23/2016 30  10.4 - 31.8 % Final  . UIBC 11/23/2016 175  ug/dL Final    ASSESSMENT/PLAN 1. Iron deficiency anemia, unspecified iron deficiency anemia type   2. Family history of breast cancer   3. Family history of ovarian cancer   4. Cancer screening   5. Pernicious anemia    Plan:  # Repeat labs showed improved hemoglobin as well as iron store. Clinically fatigue has improved. # Pernicious anemia: She also appears to have long standing B12 deficiency which requires B12 shots periodically. Most recent B12 level is normal.  Positive anti parietal antibody, and negative intrinsic factor antibody. She will need lifelong parental B12 shots.  # I discussed with patient regarding the importance of colonoscopy, especially now she shows signs of iron deficiency, colon cancer or other etiology for chronic blood loss need to be ruled out.  Also may qualify for EGD to further evaluation her chronic acid reflux and possible autoimmune gastritis/pernicious anemia.  Patient reports her hesitancy due to a bad experience from her co worker. Patient also reports that her PCP has also discussed with her about the necessarily of colonoscopy and she has declined. She is still hesitating today. I offer to refer to a GI specialist Dr.Anna and have a discussion about the procedure risk and benefit of colonoscopy/EGD.  # Her family history revealed both ovarian and breast cancer, per patient no one else in the family has cancer. No genetic test was done. She will also need mammogram screen. At this point, she is quite overwhelmed with the information. Will continue counseling at next visit.   Return of visit in 12 weeks with CBC, iron panel repeated a few days before clinic.   Orders Placed This Encounter  Procedures  . CBC with Differential/Platelet     Standing Status:   Future    Standing Expiration Date:   11/27/2017  . Ferritin    Standing Status:   Future    Standing Expiration Date:   11/27/2017  . Iron and TIBC  Standing Status:   Future    Standing Expiration Date:   11/27/2017  . Ambulatory referral to Gastroenterology    Referral Priority:   Routine    Referral Type:   Consultation    Referral Reason:   Specialty Services Required    Referred to Provider:   Jonathon Bellows, MD    Number of Visits Requested:   1    All questions were answered. The patient knows to call the clinic with any problems, questions or concerns.  Thank you for this kind referral and the opportunity to participate in the care of this patient.   Total face to face encounter time for this patient visit was 40 min. >50% of the time was  spent in counseling and coordination of care.   Earlie Server MD PhD [facility] [phone] Phone: [phone] 10/15/2016

## 2016-12-11 ENCOUNTER — Encounter: Payer: Self-pay | Admitting: Oncology

## 2016-12-26 ENCOUNTER — Ambulatory Visit: Payer: Medicare Other | Admitting: Gastroenterology

## 2017-02-26 NOTE — Progress Notes (Signed)
[facility]  Hematology/Oncology Follow Up Note [facility] [phone] [phone]  Patient Care Team: Cecile Sheerer, MD as PCP - General (Family Medicine)  REASON FOR VISIT Follow up for treatment of iron deficiency anemia, B12 deficiency, fatigue.   HISTORY OF PRESENTING ILLNESS: Elizabeth [name] 66 y.o. female PMH HLD, COPD, vitamin B12 deficiency, depression who is referred by Dr.Shapely-Quinn for evaluation and treatment of iron deficiency anemia. Patient reports that 3 months ago she felt "sick" and she was told that her blood counts were low and later she was informed that her iron levels are low too. She started taking over-the-counter iron supplementation for a few months. Patient's primary care doctor recently checked her CBC and iron test. Lab reports from [facility] showed WBC 9.6 hemoglobin 11.4 platelet 330, MCV 83. Iron 25 TIBC 382, saturation 7% ferritin 9. TSH 1.85 B12 631. She also had stool occult done in May 2018 which were negative. The patient denies any change of bowel habits black stool or blood in the stool. She denies any weight loss fever or night sweats. She feels weak and lack of exercise endurance.  Patient reports that she has a long history of vitamin B12 deficiency. She gets parenteral vitamin shots periodically, managed by PCP and was given by her daughter.  Patient reports that she has had hysterectomy at age of 38. She hasn't had her colonoscopy screening done yet. She denies any bleeding events though she noted easy bruising. She denies any symptoms. He reports that her mother passed away at age of 66 with ovarian cancer and for some unknown reason autopsy was requested at that time and she was told her mom also had breast cancer. Denies any other family cancer history. She is a active smoker, she denies alcohol use. She consume vegetarian diet and brew and drink tea every day.  The  INTERVAL HISTORY Patient presents to reports feeling fatigue which has been chronic. She feels better after the iron infusion during the summer. Recently she feels more tired. She has not been seen by GI physician. She is a daily smoker. She also reports some left supraclavicular area swelling swelling recently. Denies blood in the stool or melena, denies nausea or vomiting. She has history of depression. No suicidal ideation.  Review of Systems  Constitutional: Positive for fatigue.  HENT:   Negative for hearing loss.   Eyes: Negative for eye problems.  Respiratory: Negative for chest tightness.   Cardiovascular: Negative for chest pain.  Gastrointestinal: Negative for abdominal distention.  Endocrine: Negative for hot flashes.  Musculoskeletal: Negative for arthralgias.  Skin: Negative for itching.  Neurological: Negative for dizziness.  Hematological: Negative for adenopathy.  Psychiatric/Behavioral: Positive for depression. The patient is not nervous/anxious.     MEDICAL HISTORY: Past Medical History:  Diagnosis Date  . Allergy   . Anxiety   . B12 deficiency   . Carpal tunnel syndrome   . COPD (chronic obstructive pulmonary disease) ([place])   . Depression   . Hyperlipidemia   . Hypertension   . Iron deficiency anemia 10/16/2016  . Lipoma of skin   . Normocytic anemia 10/16/2016  . QT prolongation   . Substance abuse     SURGICAL HISTORY: Past Surgical History:  Procedure Laterality Date  . ABDOMINAL HYSTERECTOMY    . BREAST EXCISIONAL BIOPSY Left   . CESAREAN SECTION    . TUBAL LIGATION      SOCIAL HISTORY: Social History   Socioeconomic  History  . Marital status: Unknown    Spouse name: Not on file  . Number of children: Not on file  . Years of education: Not on file  . Highest education level: Not on file  Social Needs  . Financial resource strain: Not on file  . Food insecurity - worry: Not on file  . Food insecurity - inability: Not on file  .  Transportation needs - medical: Not on file  . Transportation needs - non-medical: Not on file  Occupational History  . Not on file  Tobacco Use  . Smoking status: Current Every Day Smoker    Packs/day: 1.00    Years: 50.00    Pack years: 50.00    Types: Cigarettes  . Smokeless tobacco: Never Used  Substance and Sexual Activity  . Alcohol use: No  . Drug use: No  . Sexual activity: Not Currently  Other Topics Concern  . Not on file  Social History Narrative  . Not on file    FAMILY HISTORY Family History  Problem Relation Age of Onset  . Ovarian cancer Mother   . Breast cancer Mother   . Stroke Father     ALLERGIES:  is allergic to levofloxacin; amoxicillin-pot clavulanate; and tetracycline.  MEDICATIONS:  Current Outpatient Medications  Medication Sig Dispense Refill  . albuterol (PROVENTIL HFA;VENTOLIN HFA) 108 (90 Base) MCG/ACT inhaler Inhale into the lungs.    Marland Kitchen albuterol (PROVENTIL) (2.5 MG/3ML) 0.083% nebulizer solution Inhale into the lungs.    Marland Kitchen amLODipine (NORVASC) 10 MG tablet take 1 tablet by mouth once daily    . budesonide-formoterol (SYMBICORT) 160-4.5 MCG/ACT inhaler Inhale into the lungs.    . cyanocobalamin (,VITAMIN B-12,) 1000 MCG/ML injection Inject into the muscle.    . erythromycin (ERY-TAB) 250 MG EC tablet Take 250 mg by mouth daily.    . fluticasone (FLONASE) 50 MCG/ACT nasal spray   1  . lisinopril (PRINIVIL,ZESTRIL) 10 MG tablet Take by mouth.    . Multiple Vitamin (MULTI-VITAMINS) TABS Take by mouth.    Marland Kitchen omeprazole (PRILOSEC) 20 MG capsule Take by mouth.    . sertraline (ZOLOFT) 100 MG tablet Take by mouth.    . simvastatin (ZOCOR) 80 MG tablet Take by mouth.    . spironolactone (ALDACTONE) 25 MG tablet Take by mouth.    . tiotropium (SPIRIVA HANDIHALER) 18 MCG inhalation capsule inhale contents of 1 capsule by mouth once daily     No current facility-administered medications for this visit.     PHYSICAL EXAMINATION:  ECOG PERFORMANCE  STATUS: 1 - Symptomatic but completely ambulatory  Vitals:   02/27/17 1156  BP: (!) 149/71  Pulse: 89  Resp: 18  Temp: 98.9 F (37.2 C)   Filed Weights   02/27/17 1156  Weight: 163 lb (73.9 kg)     Physical Exam  Constitutional: She is oriented to person, place, and time and well-developed, well-nourished, and in no distress. No distress.  HENT:  Head: Normocephalic and atraumatic.  Eyes: Conjunctivae are normal. Pupils are equal, round, and reactive to light.  Neck: Normal range of motion. Neck supple.  Left supraclavicular swelling.   Cardiovascular: Normal rate and regular rhythm. Exam reveals no friction rub.  Pulmonary/Chest: Effort normal and breath sounds normal.  Abdominal: Soft. Bowel sounds are normal. She exhibits no distension.  Musculoskeletal: Normal range of motion.  Neurological: She is alert and oriented to person, place, and time.  Skin: Skin is warm and dry.  Psychiatric: Affect normal.  LABORATORY DATA: I have personally reviewed the data as listed: Lab reports from [facility] showed WBC 9.6 hemoglobin 11.4 platelet 330, MCV 83. Iron 25 TIBC 382, saturation 7% ferritin 9. TSH 1.85 B12 631. She also had stool occult done in May 2018 which were negative  No visits with results within 1 Month(s) from this visit.  Latest known visit with results is:  Appointment on 11/27/2016  Component Date Value Ref Range Status  . Color, Urine 11/27/2016 YELLOW* YELLOW Final  . APPearance 11/27/2016 CLEAR* CLEAR Final  . Specific Gravity, Urine 11/27/2016 1.019  1.005 - 1.030 Final  . pH 11/27/2016 6.0  5.0 - 8.0 Final  . Glucose, UA 11/27/2016 NEGATIVE  NEGATIVE mg/dL Final  . Hgb urine dipstick 11/27/2016 NEGATIVE  NEGATIVE Final  . Bilirubin Urine 11/27/2016 NEGATIVE  NEGATIVE Final  . Ketones, ur 11/27/2016 NEGATIVE  NEGATIVE mg/dL Final  . Protein, ur 11/27/2016 NEGATIVE  NEGATIVE mg/dL Final  . Nitrite 11/27/2016 NEGATIVE  NEGATIVE Final  . Leukocytes,  UA 11/27/2016 NEGATIVE  NEGATIVE Final  . RBC / HPF 11/27/2016 0-5  0 - 5 RBC/hpf Final  . WBC, UA 11/27/2016 0-5  0 - 5 WBC/hpf Final  . Bacteria, UA 11/27/2016 NONE SEEN  NONE SEEN Final  . Squamous Epithelial / LPF 11/27/2016 0-5* NONE SEEN Final  . Mucus 11/27/2016 PRESENT   Final    ASSESSMENT/PLAN 1. Iron deficiency anemia, unspecified iron deficiency anemia type   2. Family history of breast cancer   3. Family history of ovarian cancer   4. Pernicious anemia   5. Localized swelling, mass and lump, neck    Plan:  # Repeat labs stable hemoglobin as well as iron store. Not explaining fatigue.   # Pernicious anemia: She also appears to have long standing B12 deficiency which requires B12 shots periodically. Most recent B12 level is normal.  Positive anti parietal antibody, and negative intrinsic factor antibody. She will need lifelong parenteral B12 injections. Patient was recommended to follow-up with GI. She was reluctant at that time. Discussed with patient again today, she agreed will make referral to see Dr. Vicente Males.  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 She agreed today will make the referral.  # Her family history revealed both ovarian and breast cancer, per patient no one else in the family has cancer. Patient reports having mammogram done this year April. Discussed with patient the need of genetic counseling and genetic testing. Handout was provided to patient. We'll refer her to Dietitian.  # # Smoking cessation was discussed with patient and cessation program information was given to patient. Reviewed the patient's medical history with [facility], She had CT scan in 2017 which showed no acute abnormalities. There are pulmonary nodules was greater than 2 year stability. Prior to this CT scan, patient had a another CT scan in  2015.   # Left supraclavicular soft tissue swelling, etiology unknown. Patient had neck ultrasound soft tissue which showed small thyroid without worrisome lesions. Since patient reports that the left neck swelling is new, will obtain another ultrasound of her neck as workup.  Return of visit in 12 weeks with CBC, iron panel repeated a few days before clinic.  All questions were answered. The patient knows to call the clinic with any problems, questions or concerns. Total face to face encounter time for this patient visit was 40  min. >50% of the time was  spent in counseling and coordination of care.   Earlie Server, MD, PhD Hematology Oncology [facility] Pager- [phone] 02/27/2017

## 2017-02-27 ENCOUNTER — Inpatient Hospital Stay (HOSPITAL_BASED_OUTPATIENT_CLINIC_OR_DEPARTMENT_OTHER): Payer: Medicare Other | Admitting: Oncology

## 2017-02-27 ENCOUNTER — Encounter: Payer: Self-pay | Admitting: Oncology

## 2017-02-27 ENCOUNTER — Inpatient Hospital Stay: Payer: Medicare Other | Attending: Oncology

## 2017-02-27 VITALS — BP 149/71 | HR 89 | Temp 98.9°F | Resp 18 | Wt 163.0 lb

## 2017-02-27 DIAGNOSIS — J449 Chronic obstructive pulmonary disease, unspecified: Secondary | ICD-10-CM | POA: Insufficient documentation

## 2017-02-27 DIAGNOSIS — Z803 Family history of malignant neoplasm of breast: Secondary | ICD-10-CM | POA: Diagnosis not present

## 2017-02-27 DIAGNOSIS — D509 Iron deficiency anemia, unspecified: Secondary | ICD-10-CM | POA: Diagnosis not present

## 2017-02-27 DIAGNOSIS — Z8041 Family history of malignant neoplasm of ovary: Secondary | ICD-10-CM

## 2017-02-27 DIAGNOSIS — F329 Major depressive disorder, single episode, unspecified: Secondary | ICD-10-CM | POA: Insufficient documentation

## 2017-02-27 DIAGNOSIS — R221 Localized swelling, mass and lump, neck: Secondary | ICD-10-CM | POA: Diagnosis not present

## 2017-02-27 DIAGNOSIS — D510 Vitamin B12 deficiency anemia due to intrinsic factor deficiency: Secondary | ICD-10-CM

## 2017-02-27 DIAGNOSIS — E538 Deficiency of other specified B group vitamins: Secondary | ICD-10-CM | POA: Insufficient documentation

## 2017-02-27 DIAGNOSIS — E785 Hyperlipidemia, unspecified: Secondary | ICD-10-CM | POA: Insufficient documentation

## 2017-02-27 DIAGNOSIS — G5600 Carpal tunnel syndrome, unspecified upper limb: Secondary | ICD-10-CM | POA: Insufficient documentation

## 2017-02-27 DIAGNOSIS — F17210 Nicotine dependence, cigarettes, uncomplicated: Secondary | ICD-10-CM | POA: Insufficient documentation

## 2017-02-27 DIAGNOSIS — I10 Essential (primary) hypertension: Secondary | ICD-10-CM | POA: Insufficient documentation

## 2017-02-27 DIAGNOSIS — Z79899 Other long term (current) drug therapy: Secondary | ICD-10-CM | POA: Insufficient documentation

## 2017-02-27 DIAGNOSIS — F419 Anxiety disorder, unspecified: Secondary | ICD-10-CM | POA: Insufficient documentation

## 2017-02-27 LAB — IRON AND TIBC
IRON: 87 ug/dL (ref 28–170)
Saturation Ratios: 35 % — ABNORMAL HIGH (ref 10.4–31.8)
TIBC: 246 ug/dL — ABNORMAL LOW (ref 250–450)
UIBC: 159 ug/dL

## 2017-02-27 LAB — URINALYSIS, COMPLETE (UACMP) WITH MICROSCOPIC
BILIRUBIN URINE: NEGATIVE
Bacteria, UA: NONE SEEN
Glucose, UA: NEGATIVE mg/dL
HGB URINE DIPSTICK: NEGATIVE
Ketones, ur: NEGATIVE mg/dL
LEUKOCYTES UA: NEGATIVE
NITRITE: NEGATIVE
PH: 6 (ref 5.0–8.0)
Protein, ur: NEGATIVE mg/dL
SPECIFIC GRAVITY, URINE: 1.016 (ref 1.005–1.030)

## 2017-02-27 LAB — CBC WITH DIFFERENTIAL/PLATELET
BASOS ABS: 0 10*3/uL (ref 0–0.1)
BASOS PCT: 1 %
EOS ABS: 0.1 10*3/uL (ref 0–0.7)
Eosinophils Relative: 1 %
HCT: 37.4 % (ref 35.0–47.0)
HEMOGLOBIN: 12.7 g/dL (ref 12.0–16.0)
LYMPHS ABS: 2.3 10*3/uL (ref 1.0–3.6)
LYMPHS PCT: 32 %
MCH: 28.5 pg (ref 26.0–34.0)
MCHC: 34 g/dL (ref 32.0–36.0)
MCV: 83.9 fL (ref 80.0–100.0)
MONO ABS: 0.6 10*3/uL (ref 0.2–0.9)
MONOS PCT: 8 %
NEUTROS ABS: 4.1 10*3/uL (ref 1.4–6.5)
NEUTROS PCT: 58 %
Platelets: 279 10*3/uL (ref 150–440)
RBC: 4.46 MIL/uL (ref 3.80–5.20)
RDW: 14.5 % (ref 11.5–14.5)
WBC: 7.1 10*3/uL (ref 3.6–11.0)

## 2017-02-27 LAB — FERRITIN: Ferritin: 221 ng/mL (ref 11–307)

## 2017-02-27 NOTE — Progress Notes (Signed)
Patient here for follow up with labs today. She is feeling well except for her COPD. She denies having any pain.

## 2017-02-28 ENCOUNTER — Telehealth: Payer: Self-pay | Admitting: Genetic Counselor

## 2017-02-28 NOTE — Telephone Encounter (Signed)
Elizabeth [name] was referred for genetic counseling by Dr. Tasia Catchings due to a family history of cancer. I spoke with Elizabeth [name] to schedule this telegenetics visit to be done by phone and she chose to schedule on Friday 11/30 at [place], [place], [facility] Genetic Counselor Phone: [phone]

## 2017-03-01 ENCOUNTER — Telehealth: Payer: Self-pay | Admitting: Genetic Counselor

## 2017-03-01 ENCOUNTER — Encounter: Payer: Self-pay | Admitting: Genetic Counselor

## 2017-03-01 NOTE — Telephone Encounter (Signed)
Cancer Genetics            Telegenetics Initial Visit    Patient Name: Marilu Rylander Patient DOB: 03-05-1951 Patient Age: 66 y.o. Phone Call Date: 03/01/2017  Referring Provider: Earlie Server, MD  Reason for Visit: Evaluate for hereditary susceptibility to cancer    Assessment and Plan:  . Ms. Cauthon's family history is not highly suggestive of a hereditary predisposition to cancer, but reportedly her mother had both breast and ovarian cancers at age 13. There are no other cancers in maternal relatives and her paternal family history is not concerning.   . Testing is recommended to determine whether she has a pathogenic mutation that will impact her screening and risk-reduction for cancer. A negative result will be reassuring.  . Ms. Spiker did not wish to pursue genetic testing today and instead wanted to discuss this information with her daughter. Testing is recommended and can be coordinated at any time in the future if she changes her mind. We recommended Ms. Siebert continue to follow the cancer screening guidelines given to her by her providers.   . Ms. Guier is encouraged to remain in contact with Cancer Genetics annually so that we can update the family history and inform her of any changes in cancer genetics and testing that may be of benefit for this family.   Dr. Grayland Ormond was available for questions concerning this case. Total time spent by counseling by phone was approximately 25 minutes.   _____________________________________________________________________   History of Present Illness: Ms. Joycelynn Fritsche, a 66 y.o. female, was referred for genetic counseling to discuss the possibility of a hereditary predisposition to cancer and discuss whether genetic testing is warranted. This was a telegenetics visit via phone.  Ms. Silveri has no personal history of cancer. She currently undergoes a yearly mammogram with tomosynthesis. She has not had a  colonoscopy, but stated that one is scheduled soon. She reported having a total hysterectomy in her late 52s due to bleeding.  Past Medical History:  Diagnosis Date  . Allergy   . Anxiety   . B12 deficiency   . Carpal tunnel syndrome   . COPD (chronic obstructive pulmonary disease) ([place])   . Depression   . Hyperlipidemia   . Hypertension   . Iron deficiency anemia 10/16/2016  . Lipoma of skin   . Normocytic anemia 10/16/2016  . QT prolongation   . Substance abuse [facility])     Past Surgical History:  Procedure Laterality Date  . ABDOMINAL HYSTERECTOMY    . BREAST EXCISIONAL BIOPSY Left   . CESAREAN SECTION    . TUBAL LIGATION      Family History: Significant diagnoses include the following:  Family History  Problem Relation Age of Onset  . Ovarian cancer Mother 32       deceased 61  . Breast cancer Mother 58  . Stroke Father   . Lung cancer Paternal Aunt   . Lung cancer Cousin     Additionally, has one daughter (age 12).  She has one sister (age 57) and had a brother who died of complications of an overdose at age 71. Her mother had 3 brothers and 2 sisters; all cancer-free. Her father had a total of 2 brothers and 2 sisters.  Ms. Lauter ancestry is Caucasian - NOS. There is no known Jewish ancestry and no consanguinity.  Discussion: We reviewed the characteristics, features and inheritance patterns of hereditary cancer syndromes.  We discussed her risk of harboring a mutation in the context of her personal and family history. We discussed the process of genetic testing, insurance coverage and implications of results: positive, negative and variant of unknown significance (VUS).   Ms. Tussey questions were answered to her satisfaction today and she is welcome to call with any additional questions or concerns. Thank you for the referral and allowing [place] to share in the care of your patient.    Steele Berg, MS, [place] Certified Genetic Counselor phone:  [phone]

## 2017-03-03 ENCOUNTER — Encounter: Payer: Self-pay | Admitting: Oncology

## 2017-03-07 ENCOUNTER — Ambulatory Visit
Admission: RE | Admit: 2017-03-07 | Discharge: 2017-03-07 | Disposition: A | Discharge status: Home / Self Care | Payer: Medicare Other | Source: Ambulatory Visit | Attending: Oncology | Admitting: Oncology

## 2017-03-07 DIAGNOSIS — R221 Localized swelling, mass and lump, neck: Secondary | ICD-10-CM | POA: Insufficient documentation

## 2017-03-08 ENCOUNTER — Encounter: Payer: Self-pay | Admitting: Oncology

## 2017-03-27 ENCOUNTER — Encounter: Payer: Self-pay | Admitting: Oncology

## 2017-03-28 ENCOUNTER — Other Ambulatory Visit: Payer: Self-pay | Admitting: *Deleted

## 2017-03-28 DIAGNOSIS — D509 Iron deficiency anemia, unspecified: Secondary | ICD-10-CM

## 2017-03-29 ENCOUNTER — Inpatient Hospital Stay: Payer: Medicare Other | Attending: Oncology

## 2017-03-29 DIAGNOSIS — F329 Major depressive disorder, single episode, unspecified: Secondary | ICD-10-CM | POA: Diagnosis not present

## 2017-03-29 DIAGNOSIS — D509 Iron deficiency anemia, unspecified: Secondary | ICD-10-CM | POA: Diagnosis not present

## 2017-03-29 DIAGNOSIS — F17210 Nicotine dependence, cigarettes, uncomplicated: Secondary | ICD-10-CM | POA: Diagnosis not present

## 2017-03-29 DIAGNOSIS — E875 Hyperkalemia: Secondary | ICD-10-CM | POA: Insufficient documentation

## 2017-03-29 DIAGNOSIS — J449 Chronic obstructive pulmonary disease, unspecified: Secondary | ICD-10-CM | POA: Insufficient documentation

## 2017-03-29 DIAGNOSIS — Z79899 Other long term (current) drug therapy: Secondary | ICD-10-CM | POA: Insufficient documentation

## 2017-03-29 DIAGNOSIS — E538 Deficiency of other specified B group vitamins: Secondary | ICD-10-CM | POA: Diagnosis not present

## 2017-03-29 DIAGNOSIS — R221 Localized swelling, mass and lump, neck: Secondary | ICD-10-CM | POA: Diagnosis not present

## 2017-03-29 DIAGNOSIS — Z803 Family history of malignant neoplasm of breast: Secondary | ICD-10-CM | POA: Diagnosis not present

## 2017-03-29 DIAGNOSIS — F419 Anxiety disorder, unspecified: Secondary | ICD-10-CM | POA: Insufficient documentation

## 2017-03-29 DIAGNOSIS — I10 Essential (primary) hypertension: Secondary | ICD-10-CM | POA: Diagnosis not present

## 2017-03-29 DIAGNOSIS — G5600 Carpal tunnel syndrome, unspecified upper limb: Secondary | ICD-10-CM | POA: Insufficient documentation

## 2017-03-29 LAB — CBC WITH DIFFERENTIAL/PLATELET
BASOS ABS: 0.1 10*3/uL (ref 0–0.1)
BASOS PCT: 1 %
EOS ABS: 0.1 10*3/uL (ref 0–0.7)
Eosinophils Relative: 1 %
HCT: 38.1 % (ref 35.0–47.0)
HEMOGLOBIN: 12.8 g/dL (ref 12.0–16.0)
Lymphocytes Relative: 36 %
Lymphs Abs: 3 10*3/uL (ref 1.0–3.6)
MCH: 28.5 pg (ref 26.0–34.0)
MCHC: 33.5 g/dL (ref 32.0–36.0)
MCV: 85 fL (ref 80.0–100.0)
MONOS PCT: 8 %
Monocytes Absolute: 0.7 10*3/uL (ref 0.2–0.9)
NEUTROS PCT: 54 %
Neutro Abs: 4.4 10*3/uL (ref 1.4–6.5)
Platelets: 321 10*3/uL (ref 150–440)
RBC: 4.48 MIL/uL (ref 3.80–5.20)
RDW: 14.1 % (ref 11.5–14.5)
WBC: 8.3 10*3/uL (ref 3.6–11.0)

## 2017-03-29 LAB — IRON AND TIBC
Iron: 59 ug/dL (ref 28–170)
Saturation Ratios: 23 % (ref 10.4–31.8)
TIBC: 257 ug/dL (ref 250–450)
UIBC: 198 ug/dL

## 2017-03-29 LAB — FERRITIN: FERRITIN: 154 ng/mL (ref 11–307)

## 2017-04-01 ENCOUNTER — Encounter: Payer: Self-pay | Admitting: Oncology

## 2017-04-05 ENCOUNTER — Encounter: Payer: Self-pay | Admitting: Genetic Counselor

## 2017-04-05 ENCOUNTER — Telehealth: Payer: Self-pay | Admitting: Genetic Counselor

## 2017-04-05 DIAGNOSIS — Z1379 Encounter for other screening for genetic and chromosomal anomalies: Secondary | ICD-10-CM | POA: Insufficient documentation

## 2017-04-05 NOTE — Telephone Encounter (Signed)
Cancer Genetics             Telegenetics Results Disclosure   Patient Name: Elizabeth [name] Patient DOB: [birth year] Patient Age: 67 y.o. Phone Call Date: 04/05/2017  Referring Provider: Earlie Server, MD    Elizabeth [name]'s genetic test results are now available. Please see the Genetics telephone note from 03/01/2017 for a detailed discussion of her personal and family histories and the recommendations provided.  Genetic Testing: After Ms. Elizabeth [name] telegenetics visit, she decided to pursue genetic testing of multiple genes associated with hereditary susceptibility to cancer. Testing included sequencing and deletion/duplication analysis. Testing did not reveal any pathogenic mutation in any of these genes.  A copy of the genetic test report will be scanned into Epic under the media tab.  The genes analyzed were the 47 genes on [facility]'s Common Cancers panel (APC, ATM, AXIN2, BARD1, BMPR1A, BRCA1, BRCA2, BRIP1, CDH1, CDK4, CDKN2A, CHEK2, CTNNA1, DICER1, EPCAM, GREM1, HOXB13, KIT, MEN1, MLH1, MSH2, MSH3, MSH6, MUTYH, NBN, NF1, NTHL1, PALB2, PDGFRA, PMS2, POLD1, POLE, PTEN, RAD50, RAD51C, RAD51D, SDHA, SDHB, SDHC, SDHD, SMAD4, SMARCA4, STK11, TP53, TSC1, TSC2, VHL).  Since the current test is not perfect, it is possible that there may be a gene mutation that current testing cannot detect, but that chance is small. It is possible that a different genetic factor, which has not yet been discovered or is not on this panel, is responsible for the cancer diagnoses in the family. Again, the likelihood of this is low. No additional testing is recommended at this time for Elizabeth [name].  Cancer Screening: These results are reassuring and indicate that Elizabeth [name] does not likely have an increased risk of cancer due to a mutation in one of these genes. We discussed undergoing cancer screenings for individuals in the general population.  The [employer]  recommends  that women follow the breast screening recommendations below, but that these may need to be modified based on other risk factors such as dense breasts, biopsy history or family history.  Breast awareness - Women should be familiar with their breasts and promptly report changes to their healthcare provider.   Starting at age 34: Breast exam, risk assessment, and risk reduction counseling by the provider and mammogram every year. The provider may discuss screening with tomosynthesis.  Elizabeth [name] is also recommended to undergo a yearly gynecologic exam and initiate colon cancer screening since has yet to do so.  Family Members: Family members may also be at some increased risk of developing cancer, over the general population risk, simply due to the family history. Women are recommended to have a yearly mammogram beginning at age 68, a yearly clinical breast exam, a yearly gynecologic exam and perform monthly breast self-exams. Colon cancer screening is recommended to begin by age 68 in both men and women, unless there is a family history of colon cancer or colon polyps or an individual has a personal history to warrant initiating screening at a younger age.  Any relative who had cancer at a young age or had a particularly rare cancer may also wish to pursue genetic testing. Genetic counselors can be located in other cities, by visiting the website of the [employer] ([employer]) and Field seismologist for a Dietitian by zip code.   Lastly, cancer genetics is a rapidly advancing field and it is possible that new genetic tests  will be appropriate for Elizabeth [name] in the future. We encourage Elizabeth [name] to remain in contact with Genetics on an annual basis so her personal and family histories can be updated.    Steele Berg, MS, [place] Certified Genetic Counselor phone: [phone]

## 2017-04-08 ENCOUNTER — Encounter: Payer: Self-pay | Admitting: Oncology

## 2017-04-11 ENCOUNTER — Other Ambulatory Visit: Payer: Self-pay

## 2017-04-12 ENCOUNTER — Encounter: Payer: Self-pay | Admitting: Oncology

## 2017-04-15 ENCOUNTER — Ambulatory Visit: Payer: Medicare Other | Admitting: Gastroenterology

## 2017-04-15 ENCOUNTER — Encounter: Payer: Self-pay | Admitting: Gastroenterology

## 2017-04-15 ENCOUNTER — Other Ambulatory Visit: Payer: Self-pay

## 2017-04-15 VITALS — BP 138/73 | HR 103 | Temp 97.6°F | Ht 62.0 in | Wt 166.4 lb

## 2017-04-15 DIAGNOSIS — D510 Vitamin B12 deficiency anemia due to intrinsic factor deficiency: Secondary | ICD-10-CM

## 2017-04-15 NOTE — Addendum Note (Signed)
Addended by: Orvilla Fus D on: 04/15/2017 02:47 PM   Modules accepted: Orders

## 2017-04-15 NOTE — Progress Notes (Signed)
Cephas Darby, MD [address]  [place]  [place], [place] [address]  Main: [phone]  Fax: [phone]    Gastroenterology Consultation  Referring Provider:     Baldo Ash* Primary Care Physician:  Cecile Sheerer, MD Primary Gastroenterologist:  Dr. Cephas Darby Reason for Consultation:     Pernicious anemia        HPI:   Elizabeth [name] is a 67 y.o. female referred by Dr. Casimer Lanius, Okey Regal, MD  for consultation & management of pernicious anemia. She has history of COPD which is under control. She also had history of iron deficiency in 09/2016, ferritin levels were 12, received IV iron and responded. She no longer needed iron replacement. 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 Her intrinsic factor antibodies are negative. She is receiving monthly B12 injections, closely followed by Dr. Tasia Catchings at [place] who referred her to GI for further evaluation. Her most recent hemoglobin, ferritin and B12 levels have been normal. Currently, she denies any GI symptoms including hematemesis, melena, rectal bleeding or hematochezia. She is going through URI symptoms. She had Hemoccult negative 3 in 12/2016 and 2 in 07/2016 at [facility]. She reports that she is not interested to undergo colonoscopy because her friend/coworker went to this procedure and had perforation, into nursing home and then deceased. Since then, she is scared of undergoing colonoscopy.  NSAIDs: None  Antiplts/Anticoagulants/Anti thrombotics: None  GI Procedures: None  She denies family history of colon cancer She denies GI surgeries  Past Medical History:  Diagnosis Date  . Allergy   . Anxiety   . B12 deficiency   . Carpal tunnel syndrome   . COPD (chronic obstructive pulmonary disease) ([place])   . Depression   . Genetic testing    Common Cancers panel (47 genes) @ [facility] - No pathogenic mutations detected  .  Hyperlipidemia   . Hypertension   . Iron deficiency anemia 10/16/2016  . Lipoma of skin   . Normocytic anemia 10/16/2016  . QT prolongation   . Substance abuse [facility])     Past Surgical History:  Procedure Laterality Date  . ABDOMINAL HYSTERECTOMY    . BREAST EXCISIONAL BIOPSY Left   . CESAREAN SECTION    . TUBAL LIGATION      Prior to Admission medications   Medication Sig Start Date End Date Taking? Authorizing Provider  albuterol (PROAIR HFA) 108 (90 Base) MCG/ACT inhaler Frequency:Q4HPRN   Dosage:2   PUFFS  Instructions:  Note:Dose: 2 PUFFS 06/06/10   [provider]  albuterol (PROVENTIL HFA;VENTOLIN HFA) 108 (90 Base) MCG/ACT inhaler Inhale into the lungs. 06/15/15   [provider]  albuterol (PROVENTIL) (2.5 MG/3ML) 0.083% nebulizer solution Inhale into the lungs. 08/30/16   [provider]  albuterol (PROVENTIL) (2.5 MG/3ML) 0.083% nebulizer solution Inhale into the lungs. 08/30/16   [provider]  amLODipine (NORVASC) 10 MG tablet take 1 tablet by mouth once daily 09/17/16   [provider]  azithromycin (ZITHROMAX) 250 MG tablet Take by mouth daily.    [provider]  budesonide-formoterol (SYMBICORT) 160-4.5 MCG/ACT inhaler Inhale into the lungs. 09/05/16   [provider]  busPIRone (BUSPAR) 5 MG tablet 1-2 tabs BID for anxiety/agitation. 02/12/17   [provider]  clonazePAM (KLONOPIN) 0.5 MG tablet Take by mouth. 06/04/13   [provider]  cyanocobalamin (,VITAMIN B-12,) 1000 MCG/ML injection Inject into the muscle. 09/04/16 09/04/17  [provider]  cyclobenzaprine (FLEXERIL) 10 MG tablet  03/24/17   [provider]  doxycycline (MONODOX) 100 MG capsule take 1 capsule by mouth twice a day with food AVOID DIRECT SUNLIGHT 02/12/17   [provider]  erythromycin (ERY-TAB) 250 MG EC tablet Take by mouth.    [provider]  fluticasone Asencion Islam) 50 MCG/ACT nasal  spray  10/10/16   [provider]  FLUZONE HIGH-DOSE 0.5 ML injection inject 0.5 milliliter intramuscularly 01/15/17   [provider]  lisinopril (PRINIVIL,ZESTRIL) 10 MG tablet Take by mouth. 09/05/16   [provider]  Multiple Vitamin (MULTI-VITAMINS) TABS Take by mouth.    [provider]  omeprazole (PRILOSEC OTC) 20 MG tablet Take by mouth. 08/25/09   [provider]  omeprazole (PRILOSEC) 20 MG capsule Take by mouth. 09/03/16   [provider]  sertraline (ZOLOFT) 100 MG tablet Take by mouth. 09/24/16   [provider]  simvastatin (ZOCOR) 80 MG tablet Take by mouth. 10/12/15   [provider]  spironolactone (ALDACTONE) 25 MG tablet Take by mouth. 09/05/16   [provider]  tiotropium (SPIRIVA HANDIHALER) 18 MCG inhalation capsule inhale contents of 1 capsule by mouth once daily 09/09/16   [provider]  venlafaxine XR (EFFEXOR-XR) 75 MG 24 hr capsule take 1 capsule by mouth EVERY MORNING AFTER 2 WEEKS, MAY INCREASE TO 2 CAPS EACH MORNING 02/12/17   [provider]    Family History  Problem Relation Age of Onset  . Ovarian cancer Mother 63       deceased 93  . Breast cancer Mother 37  . Stroke Father   . Lung cancer Paternal Aunt   . Lung cancer Cousin      Social History   Tobacco Use  . Smoking status: Current Every Day Smoker    Packs/day: 1.00    Years: 50.00    Pack years: 50.00    Types: Cigarettes  . Smokeless tobacco: Never Used  Substance Use Topics  . Alcohol use: No  . Drug use: No    Allergies as of 04/15/2017 - Review Complete 02/27/2017  Allergen Reaction Noted  . Levofloxacin Other (See Comments) 03/04/2013  . Tetracycline Other (See Comments) 03/23/2013  . Amoxicillin-pot clavulanate Nausea Only and Other (See Comments) 07/02/2013    Review of Systems:    All systems reviewed and negative except where noted in HPI.   Physical Exam:  BP 138/73  No LMP  recorded.  General:   Alert,  Well-developed, well-nourished, pleasant and cooperative in NAD Head:  Normocephalic and atraumatic. Eyes:  Sclera clear, no icterus.   Conjunctiva pink. Ears:  Normal auditory acuity. Nose:  No deformity, discharge, or lesions. Mouth:  No deformity or lesions,oropharynx pink & moist. Neck:  Supple; no masses or thyromegaly. Lungs:  Respirations even and unlabored.  Clear throughout to auscultation.   No wheezes, crackles, or rhonchi. No acute distress. Heart:  Regular rate and rhythm; no murmurs, clicks, rubs, or gallops. Abdomen:  Normal bowel sounds. Soft, non-tender and non-distended without masses, hepatosplenomegaly or hernias noted.  No guarding or rebound tenderness.   Rectal: Not performed Msk:  Symmetrical without gross deformities. Good, equal movement & strength bilaterally. Pulses:  Normal pulses noted. Extremities:  No clubbing or edema.  No cyanosis. Neurologic:  Alert and oriented x3;  grossly normal neurologically. Skin:  Intact without significant lesions or rashes. No jaundice. Lymph Nodes:  No significant cervical adenopathy. Psych:  Alert and cooperative. Normal mood  and affect.  Imaging Studies: No abdominal imaging  Assessment and Plan:   Nneka Blanda is a 67 y.o. female with hypertension, hyperlipidemia, COPD referred for further evaluation of pernicious anemia. She had past history of iron deficiency which resolved with iron replacement.  - Recommend upper endoscopy with acid biopsies to evaluate for atrophic gastritis and for presence of intestinal metaplasia/dysplasia. Will schedule EGD in next 3-4 weeks after she recovers from cold. - Since her iron deficiency anemia resolved and Hemoccult have been negative within last 1 year, also patient not willing to undergo colonoscopy, we'll defer this procedure at this time  I have discussed alternative options, risks & benefits,  which include, but are not limited to, bleeding,  infection, perforation,respiratory complication & drug reaction.  The patient agrees with this plan & written consent will be obtained.     Follow up based on the EGD results if needed  Cephas Darby, MD

## 2017-04-17 ENCOUNTER — Encounter: Payer: Self-pay | Admitting: Oncology

## 2017-04-17 ENCOUNTER — Encounter: Payer: Self-pay | Admitting: Gastroenterology

## 2017-04-30 ENCOUNTER — Encounter: Payer: Self-pay | Admitting: Oncology

## 2017-04-30 ENCOUNTER — Encounter: Payer: Self-pay | Admitting: Gastroenterology

## 2017-05-09 ENCOUNTER — Ambulatory Visit: Admit: 2017-05-09 | Payer: Medicare Other | Admitting: Gastroenterology

## 2017-05-09 SURGERY — ESOPHAGOGASTRODUODENOSCOPY (EGD) WITH PROPOFOL
Anesthesia: General

## 2017-05-20 ENCOUNTER — Encounter: Payer: Self-pay | Admitting: Oncology

## 2017-05-25 ENCOUNTER — Telehealth: Payer: Self-pay | Admitting: Oncology

## 2017-05-25 ENCOUNTER — Other Ambulatory Visit: Payer: Self-pay | Admitting: Oncology

## 2017-05-25 DIAGNOSIS — R5383 Other fatigue: Secondary | ICD-10-CM

## 2017-05-25 NOTE — Telephone Encounter (Signed)
Patient's daughter called and reports her mother feels very weak, maybe "blood is low". Denies seeing any blood in stool or black stool.  Advise patient to go to ER or urgent care to have labs done and further evaluate during weekend..  She has lab encounter on next Monday and I will schedule to see her on Monday too. Daughter voices understanding.

## 2017-05-27 ENCOUNTER — Inpatient Hospital Stay: Payer: Medicare Other | Attending: Oncology

## 2017-05-27 ENCOUNTER — Inpatient Hospital Stay (HOSPITAL_BASED_OUTPATIENT_CLINIC_OR_DEPARTMENT_OTHER): Payer: Medicare Other | Admitting: Oncology

## 2017-05-27 ENCOUNTER — Encounter: Payer: Self-pay | Admitting: Oncology

## 2017-05-27 VITALS — BP 123/79 | HR 93 | Temp 98.0°F | Resp 18 | Ht 62.0 in | Wt 166.9 lb

## 2017-05-27 DIAGNOSIS — D510 Vitamin B12 deficiency anemia due to intrinsic factor deficiency: Secondary | ICD-10-CM

## 2017-05-27 DIAGNOSIS — J449 Chronic obstructive pulmonary disease, unspecified: Secondary | ICD-10-CM | POA: Insufficient documentation

## 2017-05-27 DIAGNOSIS — D509 Iron deficiency anemia, unspecified: Secondary | ICD-10-CM

## 2017-05-27 DIAGNOSIS — R5383 Other fatigue: Secondary | ICD-10-CM | POA: Diagnosis not present

## 2017-05-27 DIAGNOSIS — F17210 Nicotine dependence, cigarettes, uncomplicated: Secondary | ICD-10-CM | POA: Diagnosis not present

## 2017-05-27 DIAGNOSIS — F329 Major depressive disorder, single episode, unspecified: Secondary | ICD-10-CM | POA: Diagnosis not present

## 2017-05-27 DIAGNOSIS — E785 Hyperlipidemia, unspecified: Secondary | ICD-10-CM | POA: Insufficient documentation

## 2017-05-27 LAB — TSH: TSH: 1.131 u[IU]/mL (ref 0.350–4.500)

## 2017-05-27 LAB — CBC WITH DIFFERENTIAL/PLATELET
BASOS PCT: 0 %
Basophils Absolute: 0 10*3/uL (ref 0–0.1)
Eosinophils Absolute: 0.1 10*3/uL (ref 0–0.7)
Eosinophils Relative: 1 %
HEMATOCRIT: 38 % (ref 35.0–47.0)
HEMOGLOBIN: 13 g/dL (ref 12.0–16.0)
LYMPHS PCT: 28 %
Lymphs Abs: 2.6 10*3/uL (ref 1.0–3.6)
MCH: 29.1 pg (ref 26.0–34.0)
MCHC: 34.2 g/dL (ref 32.0–36.0)
MCV: 85.2 fL (ref 80.0–100.0)
MONOS PCT: 8 %
Monocytes Absolute: 0.8 10*3/uL (ref 0.2–0.9)
NEUTROS PCT: 63 %
Neutro Abs: 5.7 10*3/uL (ref 1.4–6.5)
Platelets: 302 10*3/uL (ref 150–440)
RBC: 4.47 MIL/uL (ref 3.80–5.20)
RDW: 14 % (ref 11.5–14.5)
WBC: 9.2 10*3/uL (ref 3.6–11.0)

## 2017-05-27 LAB — IRON AND TIBC
Iron: 74 ug/dL (ref 28–170)
Saturation Ratios: 24 % (ref 10.4–31.8)
TIBC: 283 ug/dL (ref 250–450)
UIBC: 215 ug/dL

## 2017-05-27 LAB — COMPREHENSIVE METABOLIC PANEL
ALBUMIN: 4.5 g/dL (ref 3.5–5.0)
ALK PHOS: 74 U/L (ref 38–126)
ALT: 16 U/L (ref 14–54)
ANION GAP: 8 (ref 5–15)
AST: 20 U/L (ref 15–41)
BILIRUBIN TOTAL: 0.5 mg/dL (ref 0.3–1.2)
BUN: 15 mg/dL (ref 6–20)
CALCIUM: 9 mg/dL (ref 8.9–10.3)
CO2: 26 mmol/L (ref 22–32)
Chloride: 102 mmol/L (ref 101–111)
Creatinine, Ser: 0.84 mg/dL (ref 0.44–1.00)
GFR calc Af Amer: >60 mL/min (ref >60)
GLUCOSE: 102 mg/dL — AB (ref 65–99)
Potassium: 4.1 mmol/L (ref 3.5–5.1)
Sodium: 136 mmol/L (ref 135–145)
TOTAL PROTEIN: 7.6 g/dL (ref 6.5–8.1)

## 2017-05-27 LAB — FERRITIN: Ferritin: 167 ng/mL (ref 11–307)

## 2017-05-27 NOTE — Progress Notes (Signed)
[facility]  Hematology/Oncology Follow Up Note [facility] [phone] [phone]  Patient Care Team: Cecile Sheerer, MD as PCP - General (Family Medicine)  REASON FOR VISIT Follow up for treatment of iron deficiency anemia, B12 deficiency, fatigue.   HISTORY OF PRESENTING ILLNESS: Elizabeth [name] 67 y.o. female PMH HLD, COPD, vitamin B12 deficiency, depression who is referred by Dr.Shapely-Quinn for evaluation and treatment of iron deficiency anemia. Patient reports that 3 months ago she felt "sick" and she was told that her blood counts were low and later she was informed that her iron levels are low too. She started taking over-the-counter iron supplementation for a few months. Patient's primary care doctor recently checked her CBC and iron test. Lab reports from [facility] showed WBC 9.6 hemoglobin 11.4 platelet 330, MCV 83. Iron 25 TIBC 382, saturation 7% ferritin 9. TSH 1.85 B12 631. She also had stool occult done in May 2018 which were negative. The patient denies any change of bowel habits black stool or blood in the stool. She denies any weight loss fever or night sweats. She feels weak and lack of exercise endurance.  Patient reports that she has a long history of vitamin B12 deficiency. She gets parenteral vitamin shots periodically, managed by PCP and was given by her daughter.  Patient reports that she has had hysterectomy at age of 70. She hasn't had her colonoscopy screening done yet. She denies any bleeding events though she noted easy bruising. She denies any symptoms. He reports that her mother passed away at age of 33 with ovarian cancer and for some unknown reason autopsy was requested at that time and she was told her mom also had breast cancer. Denies any other family cancer history. She is a active smoker, she denies alcohol use. She consume vegetarian diet and brew and drink tea every day. T  # .Her  family history revealed both ovarian and breast cancer, per patient no one else in the family has cancer. Patient reports having mammogram done this year April. Discussed with patient the need of genetic counseling and genetic testing. Testing did not reveal any pathogenic mutation in any of these genes. A copy of the genetic test report will be scanned into Epic under the media tab. The genes analyzed were the 47 genes on [facility]'s Common Cancers panel (APC, ATM, AXIN2, BARD1, BMPR1A, BRCA1, BRCA2, BRIP1, CDH1, CDK4, CDKN2A, CHEK2, CTNNA1, DICER1, EPCAM, GREM1, HOXB13, KIT, MEN1, MLH1, MSH2, MSH3, MSH6, MUTYH, NBN, NF1, NTHL1, PALB2, PDGFRA, PMS2, POLD1, POLE, PTEN, RAD50, RAD51C, RAD51D, SDHA, SDHB, SDHC, SDHD, SMAD4, SMARCA4, STK11, TP53, TSC1, TSC2, VHL).    INTERVAL HISTORY Patient and her daughter called this Saturday, reporting feeling fatigues, feeling "blood is low, having bruises". She has lab encounter scheduled today and her appointment was moved up to today for urgent assessment.  Patient report feeling tired, congested. She says it maybe my allergies. She also showed me two pictures of bruising on her upper extremities. One of them was a result from her dog jumping on her and causing also a small scratch. She can not recall any injury or trauma that happened to her other bruising. Currently she does not have any bruising spots.   She appears quite anxious and she has been recently started on Effecxor.  She enies blood in the stool or melena, denies nausea or vomiting. She has history of depression. She was referred to see GI Dr.Vanga previously and was recommend to have EGD.  Patient declined.   Review of Systems  Constitutional: Positive for fatigue.  HENT:   Negative for hearing loss.   Eyes: Negative for eye problems.  Respiratory: Negative for chest tightness.   Cardiovascular: Negative for chest pain.  Gastrointestinal: Negative for abdominal distention.  Endocrine: Negative  for hot flashes.  Musculoskeletal: Negative for arthralgias.  Skin: Negative for itching.  Neurological: Negative for dizziness.  Hematological: Negative for adenopathy.  Psychiatric/Behavioral: Positive for depression. The patient is not nervous/anxious.     MEDICAL HISTORY: Past Medical History:  Diagnosis Date  . Allergy   . Anxiety   . B12 deficiency   . Carpal tunnel syndrome   . COPD (chronic obstructive pulmonary disease) ([place])   . Depression   . Genetic testing    Common Cancers panel (47 genes) @ [facility] - No pathogenic mutations detected  . Hyperlipidemia   . Hypertension   . Iron deficiency anemia 10/16/2016  . Lipoma of skin   . Normocytic anemia 10/16/2016  . QT prolongation   . Substance abuse ([place])     SURGICAL HISTORY: Past Surgical History:  Procedure Laterality Date  . ABDOMINAL HYSTERECTOMY    . BREAST EXCISIONAL BIOPSY Left   . CESAREAN SECTION    . TUBAL LIGATION      SOCIAL HISTORY: Social History   Socioeconomic History  . Marital status: Divorced    Spouse name: Not on file  . Number of children: Not on file  . Years of education: Not on file  . Highest education level: Not on file  Social Needs  . Financial resource strain: Not on file  . Food insecurity - worry: Not on file  . Food insecurity - inability: Not on file  . Transportation needs - medical: Not on file  . Transportation needs - non-medical: Not on file  Occupational History  . Not on file  Tobacco Use  . Smoking status: Current Every Day Smoker    Packs/day: 1.00    Years: 50.00    Pack years: 50.00    Types: Cigarettes  . Smokeless tobacco: Never Used  Substance and Sexual Activity  . Alcohol use: No  . Drug use: No  . Sexual activity: Not Currently  Other Topics Concern  . Not on file  Social History Narrative  . Not on file    FAMILY HISTORY Family History  Problem Relation Age of Onset  . Ovarian cancer Mother 70       deceased 25  . Breast cancer  Mother 63  . Stroke Father   . Lung cancer Paternal Aunt   . Lung cancer Cousin     ALLERGIES:  is allergic to levofloxacin; tetracycline; and amoxicillin-pot clavulanate.  MEDICATIONS:  Current Outpatient Medications  Medication Sig Dispense Refill  . albuterol (PROAIR HFA) 108 (90 Base) MCG/ACT inhaler Frequency:Q4HPRN   Dosage:2   PUFFS  Instructions:  Note:Dose: 2 PUFFS    . albuterol (PROVENTIL) (2.5 MG/3ML) 0.083% nebulizer solution Inhale into the lungs.    Marland Kitchen amLODipine (NORVASC) 10 MG tablet take 1 tablet by mouth once daily    . budesonide-formoterol (SYMBICORT) 160-4.5 MCG/ACT inhaler Inhale into the lungs.    . busPIRone (BUSPAR) 5 MG tablet 1-2 tabs BID for anxiety/agitation.    . cyanocobalamin (,VITAMIN B-12,) 1000 MCG/ML injection Inject into the muscle.    . cyclobenzaprine (FLEXERIL) 10 MG tablet   0  . fluticasone (FLONASE) 50 MCG/ACT nasal spray   1  . FLUZONE HIGH-DOSE 0.5 ML injection  inject 0.5 milliliter intramuscularly  0  . lisinopril (PRINIVIL,ZESTRIL) 10 MG tablet Take by mouth.    . Multiple Vitamin (MULTI-VITAMINS) TABS Take by mouth.    Marland Kitchen omeprazole (PRILOSEC OTC) 20 MG tablet Take by mouth.    . sertraline (ZOLOFT) 100 MG tablet Take by mouth.    . simvastatin (ZOCOR) 80 MG tablet Take by mouth.    . spironolactone (ALDACTONE) 25 MG tablet Take by mouth.    . tiotropium (SPIRIVA HANDIHALER) 18 MCG inhalation capsule inhale contents of 1 capsule by mouth once daily    . venlafaxine XR (EFFEXOR-XR) 150 MG 24 hr capsule Take 150 mg by mouth daily with breakfast.    . albuterol (PROVENTIL) (2.5 MG/3ML) 0.083% nebulizer solution Inhale into the lungs.    Marland Kitchen azithromycin (ZITHROMAX) 250 MG tablet Take by mouth daily.    Marland Kitchen omeprazole (PRILOSEC) 20 MG capsule Take by mouth.    . venlafaxine XR (EFFEXOR-XR) 75 MG 24 hr capsule take 1 capsule by mouth EVERY MORNING AFTER 2 WEEKS, MAY INCREASE TO 2 CAPS EACH MORNING  1   No current facility-administered medications  for this visit.     PHYSICAL EXAMINATION:  ECOG PERFORMANCE STATUS: 1 - Symptomatic but completely ambulatory  Vitals:   05/27/17 1210  BP: 123/79  Pulse: 93  Resp: 18  Temp: 98 F (36.7 C)  SpO2: 96%   Filed Weights   05/27/17 1210  Weight: 166 lb 14.4 oz (75.7 kg)     Physical Exam  Constitutional: She is oriented to person, place, and time and well-developed, well-nourished, and in no distress. No distress.  HENT:  Head: Normocephalic and atraumatic.  Eyes: Conjunctivae are normal. Pupils are equal, round, and reactive to light. Left eye exhibits no discharge. No scleral icterus.  Neck: Normal range of motion. Neck supple.  Cardiovascular: Normal rate, regular rhythm and normal heart sounds. Exam reveals no friction rub.  No murmur heard. Pulmonary/Chest: Effort normal and breath sounds normal. No respiratory distress. She has no wheezes.  Abdominal: Soft. Bowel sounds are normal. She exhibits no distension. There is no tenderness. There is no rebound.  Musculoskeletal: Normal range of motion. She exhibits no edema.  Lymphadenopathy:    She has no cervical adenopathy.  Neurological: She is alert and oriented to person, place, and time. No cranial nerve deficit.  Skin: Skin is warm and dry.  I do not any bruising spots on her upper and lower extremities and trunk.   Psychiatric: Affect and judgment normal.      LABORATORY DATA: I have personally reviewed the data as listed: Lab reports from [facility] health showed WBC 9.6 hemoglobin 11.4 platelet 330, MCV 83. Iron 25 TIBC 382, saturation 7% ferritin 9. TSH 1.85 B12 631. She also had stool occult done in May 2018 which were negative  Appointment on 05/27/2017  Component Date Value Ref Range Status  . WBC 05/27/2017 9.2  3.6 - 11.0 K/uL Final  . RBC 05/27/2017 4.47  3.80 - 5.20 MIL/uL Final  . Hemoglobin 05/27/2017 13.0  12.0 - 16.0 g/dL Final  . HCT 05/27/2017 38.0  35.0 - 47.0 % Final  . MCV 05/27/2017 85.2  80.0 -  100.0 fL Final  . MCH 05/27/2017 29.1  26.0 - 34.0 pg Final  . MCHC 05/27/2017 34.2  32.0 - 36.0 g/dL Final  . RDW 05/27/2017 14.0  11.5 - 14.5 % Final  . Platelets 05/27/2017 302  150 - 440 K/uL Final  . Neutrophils Relative %  05/27/2017 63  % Final  . Neutro Abs 05/27/2017 5.7  1.4 - 6.5 K/uL Final  . Lymphocytes Relative 05/27/2017 28  % Final  . Lymphs Abs 05/27/2017 2.6  1.0 - 3.6 K/uL Final  . Monocytes Relative 05/27/2017 8  % Final  . Monocytes Absolute 05/27/2017 0.8  0.2 - 0.9 K/uL Final  . Eosinophils Relative 05/27/2017 1  % Final  . Eosinophils Absolute 05/27/2017 0.1  0 - 0.7 K/uL Final  . Basophils Relative 05/27/2017 0  % Final  . Basophils Absolute 05/27/2017 0.0  0 - 0.1 K/uL Final   Performed at [facility], [address] [place], [place] [address]  . Sodium 05/27/2017 136  135 - 145 mmol/L Final  . Potassium 05/27/2017 4.1  3.5 - 5.1 mmol/L Final  . Chloride 05/27/2017 102  101 - 111 mmol/L Final  . CO2 05/27/2017 26  22 - 32 mmol/L Final  . Glucose, Bld 05/27/2017 102* 65 - 99 mg/dL Final  . BUN 05/27/2017 15  6 - 20 mg/dL Final  . Creatinine, Ser 05/27/2017 0.84  0.44 - 1.00 mg/dL Final  . Calcium 05/27/2017 9.0  8.9 - 10.3 mg/dL Final  . Total Protein 05/27/2017 7.6  6.5 - 8.1 g/dL Final  . Albumin 05/27/2017 4.5  3.5 - 5.0 g/dL Final  . AST 05/27/2017 20  15 - 41 U/L Final  . ALT 05/27/2017 16  14 - 54 U/L Final  . Alkaline Phosphatase 05/27/2017 74  38 - 126 U/L Final  . Total Bilirubin 05/27/2017 0.5  0.3 - 1.2 mg/dL Final  . GFR calc non Af Amer 05/27/2017 >60  >60 mL/min Final  . GFR calc Af Amer 05/27/2017 >60  >60 mL/min Final   Comment: (NOTE) The eGFR has been calculated using the CKD EPI equation. This calculation has not been validated in all clinical situations. eGFR's persistently <60 mL/min signify possible Chronic Kidney Disease.   Georgiann Hahn gap 05/27/2017 8  5 - 15 Final   Performed at [facility], [address]  [place], [place] [address]    ASSESSMENT/PLAN 1. Other fatigue   2. Iron deficiency anemia, unspecified iron deficiency anemia type   3. Pernicious anemia    Plan:  # Repeat labs showed stable hemoglobin as well as iron store. Not explaining fatigue.   # Pernicious anemia: She also appears to have long standing B12 deficiency which requires B12 shots periodically. Most recent B12 level is normal.  Positive anti parietal antibody, and negative intrinsic factor antibody. She will need lifelong parenteral B12 injections. Patient was recommended to follow-up with GI. She was reluctant at that time. I encourage her to continue follow up with GI.  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 She was seen and evaluated by GI, declined work up    # # Smoking cessation was discussed with patient and cessation program information was given to patient. Reviewed the patient's medical history with [facility], She had CT scan in 2017 which showed no acute abnormalities. There are pulmonary nodules was greater than 2 year stability. Prior to this CT scan, patient had a another CT scan in 2015.   # Left supraclavicular soft tissue swelling, etiology unknown. Patient had neck ultrasound soft tissue which showed small thyroid without worrisome lesions. Since patient reports that the left neck swelling is new, will obtain another ultrasound of  her neck as workup.  Return of visit in  3 months with CBC, iron panel repeated a few days before clinic.  All questions were answered. The patient knows to call the clinic with any problems, questions or concerns.   Earlie Server, MD, PhD Hematology Oncology [facility] at [facility] Pager- [phone] 05/27/2017

## 2017-05-27 NOTE — Progress Notes (Signed)
Patient c/o Coughing, SOB, COPD, Patient c/o brusing she thinks it her new depressoin medication. Patient states the reason she had a fall x 1 week ago ( she has been x 1 month). No fever, no dizziness. Patient c/o pain in left knee / hurts all the time.  Patient states she has been taking Ibuprofen / MD asked for her to stop taking due to the brusing

## 2017-05-29 ENCOUNTER — Inpatient Hospital Stay: Payer: Medicare Other | Admitting: Oncology

## 2017-06-11 ENCOUNTER — Encounter: Payer: Self-pay | Admitting: Oncology

## 2017-06-12 ENCOUNTER — Encounter: Payer: Self-pay | Admitting: Oncology

## 2017-06-18 ENCOUNTER — Encounter: Payer: Self-pay | Admitting: Oncology

## 2017-06-23 ENCOUNTER — Encounter: Payer: Self-pay | Admitting: Oncology

## 2017-06-24 ENCOUNTER — Encounter: Payer: Self-pay | Admitting: *Deleted

## 2017-06-24 DIAGNOSIS — F1910 Other psychoactive substance abuse, uncomplicated: Secondary | ICD-10-CM | POA: Insufficient documentation

## 2017-06-25 ENCOUNTER — Encounter: Payer: Self-pay | Admitting: Oncology

## 2017-08-19 ENCOUNTER — Inpatient Hospital Stay: Payer: Medicare Other | Attending: Oncology

## 2017-08-19 DIAGNOSIS — E538 Deficiency of other specified B group vitamins: Secondary | ICD-10-CM | POA: Diagnosis not present

## 2017-08-19 DIAGNOSIS — D509 Iron deficiency anemia, unspecified: Secondary | ICD-10-CM | POA: Diagnosis present

## 2017-08-19 DIAGNOSIS — D510 Vitamin B12 deficiency anemia due to intrinsic factor deficiency: Secondary | ICD-10-CM | POA: Diagnosis not present

## 2017-08-19 DIAGNOSIS — R5383 Other fatigue: Secondary | ICD-10-CM

## 2017-08-19 LAB — CBC WITH DIFFERENTIAL/PLATELET
BASOS ABS: 0.1 10*3/uL (ref 0–0.1)
Basophils Relative: 1 %
EOS PCT: 1 %
Eosinophils Absolute: 0.1 10*3/uL (ref 0–0.7)
HCT: 37.2 % (ref 35.0–47.0)
Hemoglobin: 12.7 g/dL (ref 12.0–16.0)
LYMPHS ABS: 2.1 10*3/uL (ref 1.0–3.6)
LYMPHS PCT: 21 %
MCH: 29.7 pg (ref 26.0–34.0)
MCHC: 34.3 g/dL (ref 32.0–36.0)
MCV: 86.7 fL (ref 80.0–100.0)
Monocytes Absolute: 0.8 10*3/uL (ref 0.2–0.9)
Monocytes Relative: 8 %
NEUTROS ABS: 7 10*3/uL — AB (ref 1.4–6.5)
Neutrophils Relative %: 69 %
PLATELETS: 278 10*3/uL (ref 150–440)
RBC: 4.29 MIL/uL (ref 3.80–5.20)
RDW: 13.6 % (ref 11.5–14.5)
WBC: 10 10*3/uL (ref 3.6–11.0)

## 2017-08-19 LAB — IRON AND TIBC
Iron: 44 ug/dL (ref 28–170)
Saturation Ratios: 17 % (ref 10.4–31.8)
TIBC: 262 ug/dL (ref 250–450)
UIBC: 218 ug/dL

## 2017-08-19 LAB — VITAMIN B12: Vitamin B-12: 841 pg/mL (ref 180–914)

## 2017-08-19 LAB — FERRITIN: FERRITIN: 140 ng/mL (ref 11–307)

## 2017-08-21 ENCOUNTER — Encounter: Payer: Self-pay | Admitting: Oncology

## 2017-08-21 ENCOUNTER — Inpatient Hospital Stay: Payer: Medicare Other | Admitting: Oncology

## 2017-08-21 VITALS — BP 137/75 | HR 88 | Temp 99.0°F | Resp 21 | Wt 168.4 lb

## 2017-08-21 DIAGNOSIS — D509 Iron deficiency anemia, unspecified: Secondary | ICD-10-CM | POA: Diagnosis not present

## 2017-08-21 DIAGNOSIS — R0602 Shortness of breath: Secondary | ICD-10-CM

## 2017-08-21 DIAGNOSIS — E538 Deficiency of other specified B group vitamins: Secondary | ICD-10-CM | POA: Diagnosis not present

## 2017-08-21 DIAGNOSIS — Z720 Tobacco use: Secondary | ICD-10-CM

## 2017-08-21 DIAGNOSIS — D510 Vitamin B12 deficiency anemia due to intrinsic factor deficiency: Secondary | ICD-10-CM

## 2017-08-21 NOTE — Progress Notes (Signed)
[facility]  Hematology/Oncology Follow Up Note [facility] Telephone:([phone] [phone]  Patient Care Team: Cecile Sheerer, MD as PCP - General (Family Medicine)  REASON FOR VISIT Follow up for treatment of iron deficiency anemia, B12 deficiency, fatigue.   HISTORY OF PRESENTING ILLNESS: Elizabeth [name] 67 y.o. female PMH HLD, COPD, vitamin B12 deficiency, depression who is referred by Dr.Shapely-Quinn for evaluation and treatment of iron deficiency anemia. Patient reports that 3 months ago she felt "sick" and she was told that her blood counts were low and later she was informed that her iron levels are low too. She started taking over-the-counter iron supplementation for a few months. Patient's primary care doctor recently checked her CBC and iron test. Lab reports from [facility] showed WBC 9.6 hemoglobin 11.4 platelet 330, MCV 83. Iron 25 TIBC 382, saturation 7% ferritin 9. TSH 1.85 B12 631. She also had stool occult done in May 2018 which were negative. The patient denies any change of bowel habits black stool or blood in the stool. She denies any weight loss fever or night sweats. She feels weak and lack of exercise endurance.  Patient reports that she has a long history of vitamin B12 deficiency. She gets parenteral vitamin shots periodically, managed by PCP and was given by her daughter.  Patient reports that she has had hysterectomy at age of 18. She hasn't had her colonoscopy screening done yet. She denies any bleeding events though she noted easy bruising. She denies any symptoms. He reports that her mother passed away at age of 73 with ovarian cancer and for some unknown reason autopsy was requested at that time and she was told her mom also had breast cancer. Denies any other family cancer history. She is a active smoker, she denies alcohol use. She consume vegetarian diet and brew and drink tea every day.  She was  referred to see GI Dr.Vanga previously and was recommend to have EGD. Patient declined.    # .Her family history revealed both ovarian and breast cancer, per patient no one else in the family has cancer. Patient reports having mammogram done this year April. Discussed with patient the need of genetic counseling and genetic testing. Testing did not reveal any pathogenic mutation in any of these genes. A copy of the genetic test report will be scanned into Epic under the media tab. The genes analyzed were the 47 genes on [facility]'s Common Cancers panel (APC, ATM, AXIN2, BARD1, BMPR1A, BRCA1, BRCA2, BRIP1, CDH1, CDK4, CDKN2A, CHEK2, CTNNA1, DICER1, EPCAM, GREM1, HOXB13, KIT, MEN1, MLH1, MSH2, MSH3, MSH6, MUTYH, NBN, NF1, NTHL1, PALB2, PDGFRA, PMS2, POLD1, POLE, PTEN, RAD50, RAD51C, RAD51D, SDHA, SDHB, SDHC, SDHD, SMAD4, SMARCA4, STK11, TP53, TSC1, TSC2, VHL).    INTERVAL HISTORY Patient present for follow-up for iron deficiency anemia, pernicious anemia. She reports feeling well at baseline.  Continue to have chronic fatigue.  She reports shortness of breath. Continues to smoke every day.  Not interested in smoke cessation. Denies blood in the stool or melena.  Denies nausea vomiting.  She enies blood in the stool or melena, denies nausea or vomiting. She has history of depression.  Review of Systems  Constitutional: Positive for fatigue.  HENT:   Negative for hearing loss.   Eyes: Negative for eye problems.  Respiratory: Positive for shortness of breath. Negative for chest tightness.   Cardiovascular: Negative for chest pain.  Gastrointestinal: Negative for abdominal distention.  Endocrine: Negative for hot flashes.  Musculoskeletal: Negative  for arthralgias.  Skin: Negative for itching.  Neurological: Negative for dizziness.  Hematological: Negative for adenopathy.  Psychiatric/Behavioral: Negative for depression. The patient is not nervous/anxious.     MEDICAL HISTORY: Past Medical  History:  Diagnosis Date  . Allergy   . Anxiety   . B12 deficiency   . Carpal tunnel syndrome   . COPD (chronic obstructive pulmonary disease) ([place])   . Depression   . Genetic testing    Common Cancers panel (47 genes) @ [facility] - No pathogenic mutations detected  . Hyperlipidemia   . Hypertension   . Iron deficiency anemia 10/16/2016  . Lipoma of skin   . Normocytic anemia 10/16/2016  . QT prolongation   . Substance abuse ([place])     SURGICAL HISTORY: Past Surgical History:  Procedure Laterality Date  . ABDOMINAL HYSTERECTOMY    . BREAST EXCISIONAL BIOPSY Left   . CESAREAN SECTION    . TUBAL LIGATION      SOCIAL HISTORY: Social History   Socioeconomic History  . Marital status: Divorced    Spouse name: Not on file  . Number of children: Not on file  . Years of education: Not on file  . Highest education level: Not on file  Occupational History  . Not on file  Social Needs  . Financial resource strain: Not on file  . Food insecurity:    Worry: Not on file    Inability: Not on file  . Transportation needs:    Medical: Not on file    Non-medical: Not on file  Tobacco Use  . Smoking status: Current Every Day Smoker    Packs/day: 1.00    Years: 50.00    Pack years: 50.00    Types: Cigarettes  . Smokeless tobacco: Never Used  Substance and Sexual Activity  . Alcohol use: No  . Drug use: No  . Sexual activity: Not Currently  Lifestyle  . Physical activity:    Days per week: Not on file    Minutes per session: Not on file  . Stress: Not on file  Relationships  . Social connections:    Talks on phone: Not on file    Gets together: Not on file    Attends religious service: Not on file    Active member of club or organization: Not on file    Attends meetings of clubs or organizations: Not on file    Relationship status: Not on file  . Intimate partner violence:    Fear of current or ex partner: Not on file    Emotionally abused: Not on file    Physically  abused: Not on file    Forced sexual activity: Not on file  Other Topics Concern  . Not on file  Social History Narrative  . Not on file    FAMILY HISTORY Family History  Problem Relation Age of Onset  . Ovarian cancer Mother 34       deceased 90  . Breast cancer Mother 38  . Stroke Father   . Lung cancer Paternal Aunt   . Lung cancer Cousin     ALLERGIES:  is allergic to levofloxacin; tetracycline; and amoxicillin-pot clavulanate.  MEDICATIONS:  Current Outpatient Medications  Medication Sig Dispense Refill  . albuterol (PROAIR HFA) 108 (90 Base) MCG/ACT inhaler Frequency:Q4HPRN   Dosage:2   PUFFS  Instructions:  Note:Dose: 2 PUFFS    . albuterol (PROVENTIL) (2.5 MG/3ML) 0.083% nebulizer solution Inhale into the lungs.    Marland Kitchen albuterol (PROVENTIL) (  2.5 MG/3ML) 0.083% nebulizer solution Inhale into the lungs.    Marland Kitchen amLODipine (NORVASC) 10 MG tablet take 1 tablet by mouth once daily    . azithromycin (ZITHROMAX) 250 MG tablet Take by mouth daily.    . budesonide-formoterol (SYMBICORT) 160-4.5 MCG/ACT inhaler Inhale into the lungs.    . cyanocobalamin (,VITAMIN B-12,) 1000 MCG/ML injection Inject into the muscle.    . cyclobenzaprine (FLEXERIL) 10 MG tablet   0  . escitalopram (LEXAPRO) 20 MG tablet Take 20 mg by mouth daily.    . fluticasone (FLONASE) 50 MCG/ACT nasal spray   1  . FLUZONE HIGH-DOSE 0.5 ML injection inject 0.5 milliliter intramuscularly  0  . lisinopril (PRINIVIL,ZESTRIL) 10 MG tablet Take by mouth.    . Multiple Vitamin (MULTI-VITAMINS) TABS Take by mouth.    Marland Kitchen omeprazole (PRILOSEC OTC) 20 MG tablet Take by mouth.    . simvastatin (ZOCOR) 80 MG tablet Take by mouth.    . spironolactone (ALDACTONE) 25 MG tablet Take by mouth.    . tiotropium (SPIRIVA HANDIHALER) 18 MCG inhalation capsule inhale contents of 1 capsule by mouth once daily    . busPIRone (BUSPAR) 5 MG tablet 1-2 tabs BID for anxiety/agitation.    . sertraline (ZOLOFT) 100 MG tablet Take by mouth.       No current facility-administered medications for this visit.     PHYSICAL EXAMINATION:  ECOG PERFORMANCE STATUS: 1 - Symptomatic but completely ambulatory  Vitals:   08/21/17 1118  BP: 137/75  Pulse: 88  Resp: (!) 21  Temp: 99 F (37.2 C)  SpO2: 95%   Filed Weights   08/21/17 1118  Weight: 168 lb 6 oz (76.4 kg)     Physical Exam  Constitutional: She is oriented to person, place, and time and well-developed, well-nourished, and in no distress. No distress.  HENT:  Head: Normocephalic and atraumatic.  Nose: Nose normal.  Mouth/Throat: Oropharynx is clear and moist. No oropharyngeal exudate.  Eyes: Pupils are equal, round, and reactive to light. Conjunctivae and EOM are normal. Left eye exhibits no discharge. No scleral icterus.  Neck: Normal range of motion. Neck supple. No JVD present.  Cardiovascular: Normal rate, regular rhythm and normal heart sounds. Exam reveals no friction rub.  No murmur heard. Pulmonary/Chest: Effort normal and breath sounds normal. No respiratory distress. She has no wheezes. She has no rales. She exhibits no tenderness.  Abdominal: Soft. Bowel sounds are normal. She exhibits no distension and no mass. There is no tenderness. There is no rebound.  Musculoskeletal: Normal range of motion. She exhibits no edema or tenderness.  Lymphadenopathy:    She has no cervical adenopathy.  Neurological: She is alert and oriented to person, place, and time. No cranial nerve deficit. She exhibits normal muscle tone. Coordination normal.  Skin: Skin is warm and dry. No rash noted. She is not diaphoretic. No erythema.  Psychiatric: Affect and judgment normal.      LABORATORY DATA: I have personally reviewed the data as listed: Lab reports from [facility] showed WBC 9.6 hemoglobin 11.4 platelet 330, MCV 83. Iron 25 TIBC 382, saturation 7% ferritin 9. TSH 1.85 B12 631. She also had stool occult done in May 2018 which were negative  Appointment on 08/19/2017   Component Date Value Ref Range Status  . Vitamin B-12 08/19/2017 841  180 - 914 pg/mL Final   Comment: (NOTE) This assay is not validated for testing neonatal or myeloproliferative syndrome specimens for Vitamin B12 levels. Performed at [facility]  [facility], [place] [address], [place], [place] [address]   . Iron 08/19/2017 44  28 - 170 ug/dL Final  . TIBC 08/19/2017 262  250 - 450 ug/dL Final  . Saturation Ratios 08/19/2017 17  10.4 - 31.8 % Final  . UIBC 08/19/2017 218  ug/dL Final   Performed at [facility], [address], [place], [place] [address]  . Ferritin 08/19/2017 140  11 - 307 ng/mL Final   Performed at [facility], [address] [place], [place] [address]  . WBC 08/19/2017 10.0  3.6 - 11.0 K/uL Final  . RBC 08/19/2017 4.29  3.80 - 5.20 MIL/uL Final  . Hemoglobin 08/19/2017 12.7  12.0 - 16.0 g/dL Final  . HCT 08/19/2017 37.2  35.0 - 47.0 % Final  . MCV 08/19/2017 86.7  80.0 - 100.0 fL Final  . MCH 08/19/2017 29.7  26.0 - 34.0 pg Final  . MCHC 08/19/2017 34.3  32.0 - 36.0 g/dL Final  . RDW 08/19/2017 13.6  11.5 - 14.5 % Final  . Platelets 08/19/2017 278  150 - 440 K/uL Final  . Neutrophils Relative % 08/19/2017 69  % Final  . Neutro Abs 08/19/2017 7.0* 1.4 - 6.5 K/uL Final  . Lymphocytes Relative 08/19/2017 21  % Final  . Lymphs Abs 08/19/2017 2.1  1.0 - 3.6 K/uL Final  . Monocytes Relative 08/19/2017 8  % Final  . Monocytes Absolute 08/19/2017 0.8  0.2 - 0.9 K/uL Final  . Eosinophils Relative 08/19/2017 1  % Final  . Eosinophils Absolute 08/19/2017 0.1  0 - 0.7 K/uL Final  . Basophils Relative 08/19/2017 1  % Final  . Basophils Absolute 08/19/2017 0.1  0 - 0.1 K/uL Final   Performed at [facility], [address], [place], [place] [address]    ASSESSMENT/PLAN 1. Iron deficiency anemia, unspecified iron deficiency anemia type   2. Pernicious anemia   3. B12 deficiency   4. Tobacco abuse   5. Shortness of breath    Plan:  #Repeat  labs showed stable hemoglobin as well as adequate iron store.  Hold additional IV iron.  # Pernicious anemia and history of B12 deficiency: Declined endoscopy.  Recommend continue to have parental B12 injections monthly.  Patient has been follow-up with primary care physician to get her B12 shots.  #Shortness of breath: Likely COPD.  Recommend patient to continue follow-up with pulmonologist.  Referred to lung cancer screening clinic. ## Smoking cessation was discussed with patient and cessation program information was given to patient.   Return of visit in  3 months with CBC, iron panel repeated a few days before clinic.  All questions were answered. The patient knows to call the clinic with any problems, questions or concerns.   Earlie Server, MD, PhD Hematology Oncology [facility] at [facility] Pager- [phone] 08/21/2017

## 2017-08-21 NOTE — Progress Notes (Signed)
Pt in for follow up.  Reports having fatigue and shortness of breath,  "having a hard time with allergy season".

## 2017-08-23 ENCOUNTER — Encounter: Payer: Self-pay | Admitting: Oncology

## 2017-10-01 ENCOUNTER — Encounter: Payer: Self-pay | Admitting: Oncology

## 2017-10-08 ENCOUNTER — Inpatient Hospital Stay: Payer: Medicare Other | Attending: Oncology

## 2017-10-08 ENCOUNTER — Other Ambulatory Visit: Payer: Self-pay

## 2017-10-08 ENCOUNTER — Other Ambulatory Visit: Payer: Self-pay | Admitting: Oncology

## 2017-10-08 DIAGNOSIS — D509 Iron deficiency anemia, unspecified: Secondary | ICD-10-CM

## 2017-10-08 DIAGNOSIS — D510 Vitamin B12 deficiency anemia due to intrinsic factor deficiency: Secondary | ICD-10-CM | POA: Diagnosis not present

## 2017-10-08 LAB — CBC WITH DIFFERENTIAL/PLATELET
BASOS ABS: 0 10*3/uL (ref 0–0.1)
Basophils Relative: 0 %
Eosinophils Absolute: 0.1 10*3/uL (ref 0–0.7)
Eosinophils Relative: 1 %
HEMATOCRIT: 35.7 % (ref 35.0–47.0)
Hemoglobin: 12.4 g/dL (ref 12.0–16.0)
LYMPHS ABS: 2.5 10*3/uL (ref 1.0–3.6)
LYMPHS PCT: 29 %
MCH: 29.7 pg (ref 26.0–34.0)
MCHC: 34.9 g/dL (ref 32.0–36.0)
MCV: 85.2 fL (ref 80.0–100.0)
MONO ABS: 0.7 10*3/uL (ref 0.2–0.9)
MONOS PCT: 8 %
NEUTROS ABS: 5.3 10*3/uL (ref 1.4–6.5)
Neutrophils Relative %: 62 %
Platelets: 289 10*3/uL (ref 150–440)
RBC: 4.19 MIL/uL (ref 3.80–5.20)
RDW: 13.7 % (ref 11.5–14.5)
WBC: 8.6 10*3/uL (ref 3.6–11.0)

## 2017-10-08 LAB — IRON AND TIBC
IRON: 45 ug/dL (ref 28–170)
Saturation Ratios: 16 % (ref 10.4–31.8)
TIBC: 274 ug/dL (ref 250–450)
UIBC: 229 ug/dL

## 2017-10-08 LAB — FERRITIN: FERRITIN: 119 ng/mL (ref 11–307)

## 2017-10-09 ENCOUNTER — Other Ambulatory Visit: Payer: Self-pay

## 2017-10-09 ENCOUNTER — Inpatient Hospital Stay: Payer: Medicare Other

## 2017-10-09 ENCOUNTER — Encounter: Payer: Self-pay | Admitting: Oncology

## 2017-10-09 ENCOUNTER — Inpatient Hospital Stay: Payer: Medicare Other | Admitting: Oncology

## 2017-10-09 VITALS — BP 131/72 | HR 83 | Temp 98.2°F | Resp 18 | Wt 173.6 lb

## 2017-10-09 DIAGNOSIS — F17210 Nicotine dependence, cigarettes, uncomplicated: Secondary | ICD-10-CM | POA: Diagnosis not present

## 2017-10-09 DIAGNOSIS — R5383 Other fatigue: Secondary | ICD-10-CM | POA: Diagnosis not present

## 2017-10-09 DIAGNOSIS — M7989 Other specified soft tissue disorders: Secondary | ICD-10-CM

## 2017-10-09 DIAGNOSIS — Z720 Tobacco use: Secondary | ICD-10-CM

## 2017-10-09 DIAGNOSIS — D510 Vitamin B12 deficiency anemia due to intrinsic factor deficiency: Secondary | ICD-10-CM | POA: Diagnosis not present

## 2017-10-09 DIAGNOSIS — D509 Iron deficiency anemia, unspecified: Secondary | ICD-10-CM | POA: Diagnosis not present

## 2017-10-09 NOTE — Progress Notes (Signed)
Patient here for follow up. States that for the last 3 months she has been "feeling tired, can't get thoughts together, been having low grade temps, been retaining fluids, feels like heart skips beats."

## 2017-10-09 NOTE — Progress Notes (Signed)
[facility]  Hematology/Oncology Follow Up Note [facility] [phone] [phone]  Patient Care Team: Cecile Sheerer, MD as PCP - General (Family Medicine)  REASON FOR VISIT Follow up for treatment of iron deficiency anemia, B12 deficiency, fatigue.   HISTORY OF PRESENTING ILLNESS: Elizabeth [name] 66 y.o. female PMH HLD, COPD, vitamin B12 deficiency, depression who is referred by Dr.Shapely-Quinn for evaluation and treatment of iron deficiency anemia. Patient reports that 3 months ago she felt "sick" and she was told that her blood counts were low and later she was informed that her iron levels are low too. She started taking over-the-counter iron supplementation for a few months. Patient's primary care doctor recently checked her CBC and iron test. Lab reports from [facility] health showed WBC 9.6 hemoglobin 11.4 platelet 330, MCV 83. Iron 25 TIBC 382, saturation 7% ferritin 9. TSH 1.85 B12 631. She also had stool occult done in May 2018 which were negative. The patient denies any change of bowel habits black stool or blood in the stool. She denies any weight loss fever or night sweats. She feels weak and lack of exercise endurance.  Patient reports that she has a long history of vitamin B12 deficiency. She gets parenteral vitamin shots periodically, managed by PCP and was given by her daughter.  Patient reports that she has had hysterectomy at age of 36. She hasn't had her colonoscopy screening done yet. She denies any bleeding events though she noted easy bruising. She denies any symptoms. He reports that her mother passed away at age of 66 with ovarian cancer and for some unknown reason autopsy was requested at that time and she was told her mom also had breast cancer. Denies any other family cancer history. She is a active smoker, she denies alcohol use. She consume vegetarian diet and brew and drink tea every day.  She was  referred to see GI Dr.Vanga previously and was recommend to have EGD. Patient declined.    # .Her family history revealed both ovarian and breast cancer, per patient no one else in the family has cancer. Patient reports having mammogram done this year April. Discussed with patient the need of genetic counseling and genetic testing. Testing did not reveal any pathogenic mutation in any of these genes. A copy of the genetic test report will be scanned into Epic under the media tab. The genes analyzed were the 47 genes on [facility]'s Common Cancers panel (APC, ATM, AXIN2, BARD1, BMPR1A, BRCA1, BRCA2, BRIP1, CDH1, CDK4, CDKN2A, CHEK2, CTNNA1, DICER1, EPCAM, GREM1, HOXB13, KIT, MEN1, MLH1, MSH2, MSH3, MSH6, MUTYH, NBN, NF1, NTHL1, PALB2, PDGFRA, PMS2, POLD1, POLE, PTEN, RAD50, RAD51C, RAD51D, SDHA, SDHB, SDHC, SDHD, SMAD4, SMARCA4, STK11, TP53, TSC1, TSC2, VHL).    INTERVAL HISTORY Patient present for follow-up for iron deficiency anemia, pernicious anemia. Patient reports feeling fatigued, weakness for the past couple of months.   Fatigue: reports worsening fatigue. Chronic onset, perisistent, no aggravating or improving factors, no associated symptoms.  She also has multiple other complaints including easy bruising, skipped heartbeat, and increased lower extremity swelling.  # Bilaterally lower extremity swelling, chronic onset, persistent, no aggravating or improving factors.  Not associated without any other   Symptoms.  Denies any shortness of breath, chest pain.  #Skipped heartbeats, intermittent.  She follows up with cardiologist Dr. Ubaldo Glassing  iron deficiency anemia,  Tobacco abuse : Continue smoke few cigarettes every day.  Not interested in smoke cessation.   denies blood in the stool  or melena.  Denies nausea vomiting. Previously declined endoscopyGI work-up. Denies weight loss, fever or chills. She denies blood in the stool or melena, denies nausea or vomiting. She has history of depression.    Review of Systems  Constitutional: Positive for fatigue. Negative for appetite change, fever and unexpected weight change.  HENT:   Negative for hearing loss, mouth sores and nosebleeds.   Eyes: Negative for eye problems.  Respiratory: Negative for chest tightness and shortness of breath.   Cardiovascular: Positive for leg swelling. Negative for chest pain and palpitations.       Skipped heartbeats  Gastrointestinal: Negative for abdominal distention, abdominal pain and blood in stool.  Endocrine: Negative for hot flashes.  Genitourinary: Negative for bladder incontinence, frequency and hematuria.   Musculoskeletal: Negative for arthralgias.  Skin: Negative for itching.  Neurological: Negative for dizziness.  Hematological: Negative for adenopathy. Bruises/bleeds easily.  Psychiatric/Behavioral: Negative for depression. The patient is not nervous/anxious.     MEDICAL HISTORY: Past Medical History:  Diagnosis Date  . Allergy   . Anxiety   . B12 deficiency   . Carpal tunnel syndrome   . COPD (chronic obstructive pulmonary disease) ([place])   . Depression   . Genetic testing    Common Cancers panel (47 genes) @ [facility] - No pathogenic mutations detected  . Hyperlipidemia   . Hypertension   . Iron deficiency anemia 10/16/2016  . Lipoma of skin   . Normocytic anemia 10/16/2016  . QT prolongation   . Substance abuse ([place])     SURGICAL HISTORY: Past Surgical History:  Procedure Laterality Date  . ABDOMINAL HYSTERECTOMY    . BREAST EXCISIONAL BIOPSY Left   . CESAREAN SECTION    . TUBAL LIGATION      SOCIAL HISTORY: Social History   Socioeconomic History  . Marital status: Divorced    Spouse name: Not on file  . Number of children: Not on file  . Years of education: Not on file  . Highest education level: Not on file  Occupational History  . Not on file  Social Needs  . Financial resource strain: Not on file  . Food insecurity:    Worry: Not on file    Inability:  Not on file  . Transportation needs:    Medical: Not on file    Non-medical: Not on file  Tobacco Use  . Smoking status: Current Every Day Smoker    Packs/day: 1.00    Years: 50.00    Pack years: 50.00    Types: Cigarettes  . Smokeless tobacco: Never Used  Substance and Sexual Activity  . Alcohol use: No  . Drug use: No  . Sexual activity: Not Currently  Lifestyle  . Physical activity:    Days per week: Not on file    Minutes per session: Not on file  . Stress: Not on file  Relationships  . Social connections:    Talks on phone: Not on file    Gets together: Not on file    Attends religious service: Not on file    Active member of club or organization: Not on file    Attends meetings of clubs or organizations: Not on file    Relationship status: Not on file  . Intimate partner violence:    Fear of current or ex partner: Not on file    Emotionally abused: Not on file    Physically abused: Not on file    Forced sexual activity: Not on file  Other  Topics Concern  . Not on file  Social History Narrative  . Not on file    FAMILY HISTORY Family History  Problem Relation Age of Onset  . Ovarian cancer Mother 71       deceased 42  . Breast cancer Mother 2  . Stroke Father   . Lung cancer Paternal Aunt   . Lung cancer Cousin     ALLERGIES:  is allergic to levofloxacin; tetracycline; and amoxicillin-pot clavulanate.  MEDICATIONS:  Current Outpatient Medications  Medication Sig Dispense Refill  . albuterol (PROAIR HFA) 108 (90 Base) MCG/ACT inhaler Frequency:Q4HPRN   Dosage:2   PUFFS  Instructions:  Note:Dose: 2 PUFFS    . albuterol (PROVENTIL) (2.5 MG/3ML) 0.083% nebulizer solution Inhale into the lungs.    Marland Kitchen amLODipine (NORVASC) 10 MG tablet take 1 tablet by mouth once daily    . azithromycin (ZITHROMAX) 250 MG tablet Take by mouth daily.    . budesonide-formoterol (SYMBICORT) 160-4.5 MCG/ACT inhaler Inhale into the lungs.    . cyanocobalamin (,VITAMIN B-12,) 1000  MCG/ML injection Inject into the muscle every 30 (thirty) days.     . cyclobenzaprine (FLEXERIL) 10 MG tablet   0  . escitalopram (LEXAPRO) 20 MG tablet Take 20 mg by mouth daily.    . fluticasone (FLONASE) 50 MCG/ACT nasal spray   1  . furosemide (LASIX) 20 MG tablet Take 20 mg by mouth daily.  0  . montelukast (SINGULAIR) 10 MG tablet Take by mouth.    . Multiple Vitamin (MULTI-VITAMINS) TABS Take by mouth.    Marland Kitchen omeprazole (PRILOSEC OTC) 20 MG tablet Take by mouth.    . simvastatin (ZOCOR) 80 MG tablet Take by mouth.    . tiotropium (SPIRIVA HANDIHALER) 18 MCG inhalation capsule inhale contents of 1 capsule by mouth once daily    . albuterol (PROVENTIL) (2.5 MG/3ML) 0.083% nebulizer solution Inhale into the lungs.    . busPIRone (BUSPAR) 5 MG tablet 1-2 tabs BID for anxiety/agitation.    Marland Kitchen FLUZONE HIGH-DOSE 0.5 ML injection inject 0.5 milliliter intramuscularly  0  . lisinopril (PRINIVIL,ZESTRIL) 10 MG tablet Take by mouth.    . sertraline (ZOLOFT) 100 MG tablet Take by mouth.    . spironolactone (ALDACTONE) 25 MG tablet Take by mouth.     No current facility-administered medications for this visit.     PHYSICAL EXAMINATION:  ECOG PERFORMANCE STATUS: 1 - Symptomatic but completely ambulatory  Vitals:   10/09/17 1340  BP: 131/72  Pulse: 83  Resp: 18  Temp: 98.2 F (36.8 C)  SpO2: 95%   Filed Weights   10/09/17 1340  Weight: 173 lb 9.6 oz (78.7 kg)     Physical Exam  Constitutional: She is oriented to person, place, and time and well-developed, well-nourished, and in no distress. No distress.  HENT:  Head: Normocephalic and atraumatic.  Nose: Nose normal.  Mouth/Throat: Oropharynx is clear and moist. No oropharyngeal exudate.  Eyes: Pupils are equal, round, and reactive to light. Conjunctivae and EOM are normal. Left eye exhibits no discharge. No scleral icterus.  Neck: Normal range of motion. Neck supple. No JVD present.  Cardiovascular: Normal rate, regular rhythm and  normal heart sounds. Exam reveals no friction rub.  No murmur heard. Pulmonary/Chest: Effort normal and breath sounds normal. No respiratory distress. She has no wheezes. She has no rales. She exhibits no tenderness.  Abdominal: Soft. Bowel sounds are normal. She exhibits no distension and no mass. There is no tenderness. There is no rebound.  Musculoskeletal: Normal range of motion. She exhibits no edema or tenderness.  Lymphadenopathy:    She has no cervical adenopathy.  Neurological: She is alert and oriented to person, place, and time. No cranial nerve deficit. She exhibits normal muscle tone. Coordination normal.  Skin: Skin is warm and dry. No rash noted. She is not diaphoretic. No erythema.  Psychiatric: Affect and judgment normal.      LABORATORY DATA: I have personally reviewed the data as listed: Lab reports from [facility] health showed WBC 9.6 hemoglobin 11.4 platelet 330, MCV 83. Iron 25 TIBC 382, saturation 7% ferritin 9. TSH 1.85 B12 631. She also had stool occult done in May 2018 which were negative  Orders Only on 10/08/2017  Component Date Value Ref Range Status  . Iron 10/08/2017 45  28 - 170 ug/dL Final  . TIBC 10/08/2017 274  250 - 450 ug/dL Final  . Saturation Ratios 10/08/2017 16  10.4 - 31.8 % Final  . UIBC 10/08/2017 229  ug/dL Final   Performed at [facility], [address], [place], [place] [address]  . Ferritin 10/08/2017 119  11 - 307 ng/mL Final   Performed at [facility], [address] [place], [place] [address]  . WBC 10/08/2017 8.6  3.6 - 11.0 K/uL Final  . RBC 10/08/2017 4.19  3.80 - 5.20 MIL/uL Final  . Hemoglobin 10/08/2017 12.4  12.0 - 16.0 g/dL Final  . HCT 10/08/2017 35.7  35.0 - 47.0 % Final  . MCV 10/08/2017 85.2  80.0 - 100.0 fL Final  . MCH 10/08/2017 29.7  26.0 - 34.0 pg Final  . MCHC 10/08/2017 34.9  32.0 - 36.0 g/dL Final  . RDW 10/08/2017 13.7  11.5 - 14.5 % Final  . Platelets 10/08/2017 289  150 - 440 K/uL Final  .  Neutrophils Relative % 10/08/2017 62  % Final  . Neutro Abs 10/08/2017 5.3  1.4 - 6.5 K/uL Final  . Lymphocytes Relative 10/08/2017 29  % Final  . Lymphs Abs 10/08/2017 2.5  1.0 - 3.6 K/uL Final  . Monocytes Relative 10/08/2017 8  % Final  . Monocytes Absolute 10/08/2017 0.7  0.2 - 0.9 K/uL Final  . Eosinophils Relative 10/08/2017 1  % Final  . Eosinophils Absolute 10/08/2017 0.1  0 - 0.7 K/uL Final  . Basophils Relative 10/08/2017 0  % Final  . Basophils Absolute 10/08/2017 0.0  0 - 0.1 K/uL Final   Performed at [facility], [address] [place], [place] [address]    ASSESSMENT/PLAN 1. Iron deficiency anemia, unspecified iron deficiency anemia type   2. Pernicious anemia   3. Tobacco abuse   4. Other fatigue   5. Leg swelling    Plan:  #Labs reviewed.  Discussed with patient that her iron panel reviewed adequate iron store.  She does not need additional IV iron. Patient reports concern about not having IV iron infusion for low iron and ferritin gradually declines.  Reassurance was given. If patient desires, she may continue to take oral iron supplementation with ferrous sulfate once daily.  She voices understanding.   # Pernicious anemia and history of B12 deficiency: Declines endoscopy and GI work-up.  Continues to receive parenteral B12 injections via PCP.  #Fatigue, unknown etiology.  Not explained by iron level and anemia level. #Skipped heart beat and leg swelling: recommend patient to follow up with Cardiology.  #Smoke cessation was discussed again with patient.  She is not interested.  Follows up with Dr.flemming.  She has had chest CT done at [facility] health care in 2013, 2015 and 2019, last seen by Dr.Flemming in May 2019. She has pulmonary nodules which has showed two year stability when she had CT done in 2017.   Return of visit in 4 months with CBC, iron panel repeated a few days before clinic.  All questions were answered. The patient knows to call the clinic with  any problems, questions or concerns. Total face to face encounter time for this patient visit was 25 min. >50% of the time was  spent in counseling and coordination of care.   Earlie Server, MD, PhD Hematology Oncology [facility] Pager- [phone] 10/09/2017

## 2017-10-30 ENCOUNTER — Encounter: Payer: Self-pay | Admitting: Oncology

## 2017-10-31 ENCOUNTER — Telehealth: Payer: Self-pay | Admitting: *Deleted

## 2017-10-31 NOTE — Telephone Encounter (Signed)
Received referral for low dose lung cancer screening CT scan. Message left at phone number listed in EMR for patient to call me back to facilitate scheduling scan.  

## 2017-11-05 ENCOUNTER — Encounter: Payer: Self-pay | Admitting: Oncology

## 2017-11-06 ENCOUNTER — Telehealth: Payer: Self-pay | Admitting: *Deleted

## 2017-11-06 DIAGNOSIS — Z122 Encounter for screening for malignant neoplasm of respiratory organs: Secondary | ICD-10-CM

## 2017-11-06 DIAGNOSIS — Z87891 Personal history of nicotine dependence: Secondary | ICD-10-CM

## 2017-11-06 NOTE — Telephone Encounter (Signed)
Received referral for initial lung cancer screening scan. Contacted patient and obtained smoking history,(current, 52 pack year) as well as answering questions related to screening process. Patient denies signs of lung cancer such as weight loss or hemoptysis. Patient denies comorbidity that would prevent curative treatment if lung cancer were found. Patient is scheduled for shared decision making visit and CT scan on 11/22/17.

## 2017-11-22 ENCOUNTER — Encounter: Payer: Self-pay | Admitting: Nurse Practitioner

## 2017-11-22 ENCOUNTER — Ambulatory Visit
Admission: RE | Admit: 2017-11-22 | Discharge: 2017-11-22 | Disposition: A | Discharge status: Home / Self Care | Payer: Medicare Other | Source: Ambulatory Visit | Attending: Nurse Practitioner | Admitting: Nurse Practitioner

## 2017-11-22 ENCOUNTER — Inpatient Hospital Stay: Payer: Medicare Other | Attending: Nurse Practitioner | Admitting: Nurse Practitioner

## 2017-11-22 DIAGNOSIS — Z122 Encounter for screening for malignant neoplasm of respiratory organs: Secondary | ICD-10-CM

## 2017-11-22 DIAGNOSIS — J432 Centrilobular emphysema: Secondary | ICD-10-CM | POA: Diagnosis not present

## 2017-11-22 DIAGNOSIS — K8020 Calculus of gallbladder without cholecystitis without obstruction: Secondary | ICD-10-CM | POA: Insufficient documentation

## 2017-11-22 DIAGNOSIS — I700 Atherosclerosis of aorta: Secondary | ICD-10-CM | POA: Diagnosis not present

## 2017-11-22 DIAGNOSIS — Z87891 Personal history of nicotine dependence: Secondary | ICD-10-CM

## 2017-11-22 DIAGNOSIS — I2510 Atherosclerotic heart disease of native coronary artery without angina pectoris: Secondary | ICD-10-CM | POA: Insufficient documentation

## 2017-11-22 NOTE — Progress Notes (Signed)
In accordance with CMS guidelines, patient has met eligibility criteria including age, absence of signs or symptoms of lung cancer.  Social History   Tobacco Use  . Smoking status: Current Every Day Smoker    Packs/day: 1.00    Years: 52.00    Pack years: 52.00    Types: Cigarettes  . Smokeless tobacco: Never Used  Substance Use Topics  . Alcohol use: No  . Drug use: No      A shared decision-making session was conducted prior to the performance of CT scan. This includes one or more decision aids, includes benefits and harms of screening, follow-up diagnostic testing, over-diagnosis, false positive rate, and total radiation exposure.   Counseling on the importance of adherence to annual lung cancer LDCT screening, impact of co-morbidities, and ability or willingness to undergo diagnosis and treatment is imperative for compliance of the program.   Counseling on the importance of continued smoking cessation for former smokers; the importance of smoking cessation for current smokers, and information about tobacco cessation interventions have been given to patient including [place] Quit Smart and 1800 quit  programs.   Written order for lung cancer screening with LDCT has been given to the patient and any and all questions have been answered to the best of my abilities.    Yearly follow up will be coordinated by Shawn Perkins, Thoracic Navigator.   , DNP, AGNP-C [facility] [phone] (work cell) [phone] (office) 11/22/17 11:35 AM   

## 2017-11-25 ENCOUNTER — Telehealth: Payer: Self-pay | Admitting: *Deleted

## 2017-11-25 ENCOUNTER — Encounter: Payer: Self-pay | Admitting: Nurse Practitioner

## 2017-11-25 ENCOUNTER — Encounter: Payer: Self-pay | Admitting: Oncology

## 2017-11-25 NOTE — Telephone Encounter (Signed)
Thanks for the update. Please forward a copy to patient's PCP to follow up her chronic problems. Thanks.

## 2017-11-25 NOTE — Telephone Encounter (Signed)
Notified patient of LDCT lung cancer screening program results with recommendation for (12) month follow up imaging.  Also notified of incidental findings noted below and is encouraged to discuss further questions with PCP who will receive a copy of this not and/or the CT reports.  Patient verbalized understanding.    IMPRESSION: 1. Lung-RADS 2S, benign appearance or behavior. Continue annual screening with low-dose chest CT without contrast in 12 months. 2. The "S" modifier above refers to potentially clinically significant non lung cancer related findings. Specifically, there is aortic atherosclerosis, in addition to left anterior descending coronary artery disease. Please note that although the presence of coronary artery calcium documents the presence of coronary artery disease, the severity of this disease and any potential stenosis cannot be assessed on this non-gated CT examination. Assessment for potential risk factor modification, dietary therapy or pharmacologic therapy may be warranted, if clinically indicated. 3. Mild diffuse bronchial wall thickening with very mild centrilobular and paraseptal emphysema; imaging findings suggestive of underlying COPD. 4. Cholelithiasis.  Aortic Atherosclerosis (ICD10-I70.0) and Emphysema (ICD10-J43.9).

## 2018-02-14 ENCOUNTER — Other Ambulatory Visit: Payer: Self-pay

## 2018-02-14 DIAGNOSIS — D509 Iron deficiency anemia, unspecified: Secondary | ICD-10-CM

## 2018-02-20 ENCOUNTER — Encounter: Payer: Self-pay | Admitting: Oncology

## 2018-02-21 ENCOUNTER — Inpatient Hospital Stay: Payer: Medicare Other | Attending: Oncology

## 2018-02-21 ENCOUNTER — Encounter: Payer: Self-pay | Admitting: Oncology

## 2018-02-21 ENCOUNTER — Other Ambulatory Visit: Payer: Self-pay | Admitting: Oncology

## 2018-02-21 DIAGNOSIS — J449 Chronic obstructive pulmonary disease, unspecified: Secondary | ICD-10-CM | POA: Insufficient documentation

## 2018-02-21 DIAGNOSIS — F329 Major depressive disorder, single episode, unspecified: Secondary | ICD-10-CM | POA: Diagnosis not present

## 2018-02-21 DIAGNOSIS — D510 Vitamin B12 deficiency anemia due to intrinsic factor deficiency: Secondary | ICD-10-CM | POA: Diagnosis not present

## 2018-02-21 DIAGNOSIS — E785 Hyperlipidemia, unspecified: Secondary | ICD-10-CM | POA: Diagnosis not present

## 2018-02-21 DIAGNOSIS — F17210 Nicotine dependence, cigarettes, uncomplicated: Secondary | ICD-10-CM | POA: Diagnosis not present

## 2018-02-21 DIAGNOSIS — R911 Solitary pulmonary nodule: Secondary | ICD-10-CM | POA: Diagnosis not present

## 2018-02-21 DIAGNOSIS — D509 Iron deficiency anemia, unspecified: Secondary | ICD-10-CM | POA: Diagnosis not present

## 2018-02-21 LAB — IRON AND TIBC
Iron: 82 ug/dL (ref 28–170)
SATURATION RATIOS: 32 % — AB (ref 10.4–31.8)
TIBC: 254 ug/dL (ref 250–450)
UIBC: 172 ug/dL

## 2018-02-21 LAB — CBC WITH DIFFERENTIAL/PLATELET
Abs Immature Granulocytes: 0.02 10*3/uL (ref 0.00–0.07)
BASOS ABS: 0 10*3/uL (ref 0.0–0.1)
Basophils Relative: 0 %
EOS ABS: 0 10*3/uL (ref 0.0–0.5)
EOS PCT: 0 %
HEMATOCRIT: 40.2 % (ref 36.0–46.0)
HEMOGLOBIN: 13.1 g/dL (ref 12.0–15.0)
Immature Granulocytes: 0 %
LYMPHS ABS: 2.4 10*3/uL (ref 0.7–4.0)
Lymphocytes Relative: 31 %
MCH: 27.6 pg (ref 26.0–34.0)
MCHC: 32.6 g/dL (ref 30.0–36.0)
MCV: 84.8 fL (ref 80.0–100.0)
MONO ABS: 0.8 10*3/uL (ref 0.1–1.0)
MONOS PCT: 10 %
NRBC: 0 % (ref 0.0–0.2)
Neutro Abs: 4.7 10*3/uL (ref 1.7–7.7)
Neutrophils Relative %: 59 %
Platelets: 275 10*3/uL (ref 150–400)
RBC: 4.74 MIL/uL (ref 3.87–5.11)
RDW: 13 % (ref 11.5–15.5)
WBC: 7.9 10*3/uL (ref 4.0–10.5)

## 2018-02-21 LAB — FERRITIN: Ferritin: 146 ng/mL (ref 11–307)

## 2018-02-24 ENCOUNTER — Encounter: Payer: Self-pay | Admitting: Oncology

## 2018-02-24 ENCOUNTER — Inpatient Hospital Stay (HOSPITAL_BASED_OUTPATIENT_CLINIC_OR_DEPARTMENT_OTHER): Payer: Medicare Other | Admitting: Oncology

## 2018-02-24 ENCOUNTER — Inpatient Hospital Stay: Payer: Medicare Other

## 2018-02-24 ENCOUNTER — Other Ambulatory Visit: Payer: Self-pay

## 2018-02-24 VITALS — BP 133/81 | HR 91 | Temp 97.7°F | Resp 18 | Wt 173.4 lb

## 2018-02-24 DIAGNOSIS — F329 Major depressive disorder, single episode, unspecified: Secondary | ICD-10-CM

## 2018-02-24 DIAGNOSIS — Z87891 Personal history of nicotine dependence: Secondary | ICD-10-CM

## 2018-02-24 DIAGNOSIS — E785 Hyperlipidemia, unspecified: Secondary | ICD-10-CM

## 2018-02-24 DIAGNOSIS — D509 Iron deficiency anemia, unspecified: Secondary | ICD-10-CM | POA: Diagnosis not present

## 2018-02-24 DIAGNOSIS — D510 Vitamin B12 deficiency anemia due to intrinsic factor deficiency: Secondary | ICD-10-CM

## 2018-02-24 DIAGNOSIS — F17210 Nicotine dependence, cigarettes, uncomplicated: Secondary | ICD-10-CM

## 2018-02-24 DIAGNOSIS — J449 Chronic obstructive pulmonary disease, unspecified: Secondary | ICD-10-CM

## 2018-02-24 DIAGNOSIS — R911 Solitary pulmonary nodule: Secondary | ICD-10-CM

## 2018-02-24 NOTE — Progress Notes (Signed)
Patient here for follow up. No concerns voiced.  °

## 2018-02-24 NOTE — Progress Notes (Signed)
[facility]  Hematology/Oncology Follow Up Note [facility] [phone] [phone]  Patient Care Team: Cecile Sheerer, MD as PCP - General (Family Medicine)  REASON FOR VISIT Follow up for treatment of iron deficiency anemia, B12 deficiency, fatigue.   HISTORY OF PRESENTING ILLNESS: Elizabeth [name] 67 y.o. female PMH HLD, COPD, vitamin B12 deficiency, depression who is referred by Dr.Shapely-Quinn for evaluation and treatment of iron deficiency anemia. Patient reports that 3 months ago she felt "sick" and she was told that her blood counts were low and later she was informed that her iron levels are low too. She started taking over-the-counter iron supplementation for a few months. Patient's primary care doctor recently checked her CBC and iron test. Lab reports from [facility] showed WBC 9.6 hemoglobin 11.4 platelet 330, MCV 83. Iron 25 TIBC 382, saturation 7% ferritin 9. TSH 1.85 B12 631. She also had stool occult done in May 2018 which were negative. The patient denies any change of bowel habits black stool or blood in the stool. She denies any weight loss fever or night sweats. She feels weak and lack of exercise endurance.  Patient reports that she has a long history of vitamin B12 deficiency. She gets parenteral vitamin shots periodically, managed by PCP and was given by her daughter.  Patient reports that she has had hysterectomy at age of 31. She hasn't had her colonoscopy screening done yet. She denies any bleeding events though she noted easy bruising. She denies any symptoms. He reports that her mother passed away at age of 12 with ovarian cancer and for some unknown reason autopsy was requested at that time and she was told her mom also had breast cancer. Denies any other family cancer history. She is a active smoker, she denies alcohol use. She consume vegetarian diet and brew and drink tea every day.  She was  referred to see GI Dr.Vanga previously and was recommend to have EGD. Patient declined.    # .Her family history revealed both ovarian and breast cancer, per patient no one else in the family has cancer. Patient reports having mammogram done this year April. Discussed with patient the need of genetic counseling and genetic testing. Testing did not reveal any pathogenic mutation in any of these genes. A copy of the genetic test report will be scanned into Epic under the media tab. The genes analyzed were the 47 genes on [facility]'s Common Cancers panel (APC, ATM, AXIN2, BARD1, BMPR1A, BRCA1, BRCA2, BRIP1, CDH1, CDK4, CDKN2A, CHEK2, CTNNA1, DICER1, EPCAM, GREM1, HOXB13, KIT, MEN1, MLH1, MSH2, MSH3, MSH6, MUTYH, NBN, NF1, NTHL1, PALB2, PDGFRA, PMS2, POLD1, POLE, PTEN, RAD50, RAD51C, RAD51D, SDHA, SDHB, SDHC, SDHD, SMAD4, SMARCA4, STK11, TP53, TSC1, TSC2, VHL).    INTERVAL HISTORY Patient with above history reviewed by me today presents for follow-up of iron deficiency anemia and pernicious anemia.    #Reports feeling well at baseline. #patient follows up with Dr. Linda Hedges for her atypical chest pain, QT prolongation.  She reports that her symptoms of skipped beats have improved.  Lower [place] swelling has also improved. # She follows up with PCP for vitamin B12 parental supplementation.    Continue smoke daily.. Review of Systems  Constitutional: Negative for appetite change, fatigue, fever and unexpected weight change.  HENT:   Negative for hearing loss, mouth sores and nosebleeds.   Eyes: Negative for eye problems.  Respiratory: Negative for chest tightness and shortness of breath.   Cardiovascular: Negative for chest pain,  leg swelling and palpitations.       Skipped heartbeats  Gastrointestinal: Negative for abdominal distention, abdominal pain and blood in stool.  Endocrine: Negative for hot flashes.  Genitourinary: Negative for bladder incontinence, frequency and hematuria.     Musculoskeletal: Negative for arthralgias.  Skin: Negative for itching.  Neurological: Negative for dizziness.  Hematological: Negative for adenopathy. Does not bruise/bleed easily.  Psychiatric/Behavioral: Negative for depression. The patient is not nervous/anxious.     MEDICAL HISTORY: Past Medical History:  Diagnosis Date  . Allergy   . Anxiety   . B12 deficiency   . Carpal tunnel syndrome   . COPD (chronic obstructive pulmonary disease) ([place])   . Depression   . Genetic testing    Common Cancers panel (47 genes) @ [facility] - No pathogenic mutations detected  . Hyperlipidemia   . Hypertension   . Iron deficiency anemia 10/16/2016  . Lipoma of skin   . Normocytic anemia 10/16/2016  . QT prolongation   . Substance abuse ([place])     SURGICAL HISTORY: Past Surgical History:  Procedure Laterality Date  . ABDOMINAL HYSTERECTOMY    . BREAST EXCISIONAL BIOPSY Left   . CESAREAN SECTION    . TUBAL LIGATION      SOCIAL HISTORY: Social History   Socioeconomic History  . Marital status: Divorced    Spouse name: Not on file  . Number of children: Not on file  . Years of education: Not on file  . Highest education level: Not on file  Occupational History  . Not on file  Social Needs  . Financial resource strain: Not on file  . Food insecurity:    Worry: Not on file    Inability: Not on file  . Transportation needs:    Medical: Not on file    Non-medical: Not on file  Tobacco Use  . Smoking status: Current Every Day Smoker    Packs/day: 1.00    Years: 52.00    Pack years: 52.00    Types: Cigarettes  . Smokeless tobacco: Never Used  Substance and Sexual Activity  . Alcohol use: No  . Drug use: No  . Sexual activity: Not Currently  Lifestyle  . Physical activity:    Days per week: Not on file    Minutes per session: Not on file  . Stress: Not on file  Relationships  . Social connections:    Talks on phone: Not on file    Gets together: Not on file    Attends  religious service: Not on file    Active member of club or organization: Not on file    Attends meetings of clubs or organizations: Not on file    Relationship status: Not on file  . Intimate partner violence:    Fear of current or ex partner: Not on file    Emotionally abused: Not on file    Physically abused: Not on file    Forced sexual activity: Not on file  Other Topics Concern  . Not on file  Social History Narrative  . Not on file    FAMILY HISTORY Family History  Problem Relation Age of Onset  . Ovarian cancer Mother 31       deceased 36  . Breast cancer Mother 40  . Stroke Father   . Lung cancer Paternal Aunt   . Lung cancer Cousin     ALLERGIES:  is allergic to levofloxacin; tetracycline; and amoxicillin-pot clavulanate.  MEDICATIONS:  Current Outpatient Medications  Medication Sig Dispense Refill  . albuterol (PROAIR HFA) 108 (90 Base) MCG/ACT inhaler Frequency:Q4HPRN   Dosage:2   PUFFS  Instructions:  Note:Dose: 2 PUFFS    . albuterol (PROVENTIL) (2.5 MG/3ML) 0.083% nebulizer solution Inhale into the lungs.    Marland Kitchen amLODipine (NORVASC) 10 MG tablet 5 mg.     . budesonide-formoterol (SYMBICORT) 160-4.5 MCG/ACT inhaler Inhale into the lungs.    . busPIRone (BUSPAR) 5 MG tablet 1-2 tabs BID for anxiety/agitation.    . cyanocobalamin (,VITAMIN B-12,) 1000 MCG/ML injection Inject into the muscle every 30 (thirty) days.     . fluticasone (FLONASE) 50 MCG/ACT nasal spray   1  . furosemide (LASIX) 20 MG tablet Take 20 mg by mouth daily.  0  . Multiple Vitamin (MULTI-VITAMINS) TABS Take by mouth.    Marland Kitchen omeprazole (PRILOSEC OTC) 20 MG tablet Take by mouth.    . sertraline (ZOLOFT) 100 MG tablet Take by mouth.    . simvastatin (ZOCOR) 80 MG tablet Take by mouth.    . tiotropium (SPIRIVA HANDIHALER) 18 MCG inhalation capsule inhale contents of 1 capsule by mouth once daily    . venlafaxine XR (EFFEXOR-XR) 150 MG 24 hr capsule TK 1 C PO ONCE D  2  . albuterol (PROVENTIL) (2.5  MG/3ML) 0.083% nebulizer solution Inhale into the lungs.    Marland Kitchen azithromycin (ZITHROMAX) 250 MG tablet Take by mouth daily.    . cyclobenzaprine (FLEXERIL) 10 MG tablet   0  . escitalopram (LEXAPRO) 20 MG tablet Take 20 mg by mouth daily.    Marland Kitchen FLUZONE HIGH-DOSE 0.5 ML injection inject 0.5 milliliter intramuscularly  0  . lisinopril (PRINIVIL,ZESTRIL) 10 MG tablet Take by mouth.    . montelukast (SINGULAIR) 10 MG tablet Take by mouth.    . spironolactone (ALDACTONE) 25 MG tablet Take by mouth.     No current facility-administered medications for this visit.     PHYSICAL EXAMINATION:  ECOG PERFORMANCE STATUS: 1 - Symptomatic but completely ambulatory  Vitals:   02/24/18 1020  BP: 133/81  Pulse: 91  Resp: 18  Temp: 97.7 F (36.5 C)   Filed Weights   02/24/18 1020  Weight: 173 lb 6.4 oz (78.7 kg)     Physical Exam  Constitutional: She is oriented to person, place, and time and well-developed, well-nourished, and in no distress. No distress.  HENT:  Head: Normocephalic and atraumatic.  Nose: Nose normal.  Mouth/Throat: Oropharynx is clear and moist. No oropharyngeal exudate.  Eyes: Pupils are equal, round, and reactive to light. Conjunctivae and EOM are normal. Left eye exhibits no discharge. No scleral icterus.  Neck: Normal range of motion. Neck supple. No JVD present.  Cardiovascular: Normal rate, regular rhythm and normal heart sounds. Exam reveals no friction rub.  No murmur heard. Pulmonary/Chest: Effort normal and breath sounds normal. No respiratory distress. She has no wheezes. She has no rales. She exhibits no tenderness.  Abdominal: Soft. Bowel sounds are normal. She exhibits no distension and no mass. There is no tenderness. There is no rebound.  Musculoskeletal: Normal range of motion. She exhibits no edema or tenderness.  Lymphadenopathy:    She has no cervical adenopathy.  Neurological: She is alert and oriented to person, place, and time. No cranial nerve  deficit. She exhibits normal muscle tone. Coordination normal.  Skin: Skin is warm and dry. No rash noted. She is not diaphoretic. No erythema.  Psychiatric: Affect and judgment normal.      LABORATORY DATA: I have personally  reviewed the data as listed: Lab reports from [facility] health showed WBC 9.6 hemoglobin 11.4 platelet 330, MCV 83. Iron 25 TIBC 382, saturation 7% ferritin 9. TSH 1.85 B12 631. She also had stool occult done in May 2018 which were negative  Appointment on 02/21/2018  Component Date Value Ref Range Status  . Iron 02/21/2018 82  28 - 170 ug/dL Final  . TIBC 02/21/2018 254  250 - 450 ug/dL Final  . Saturation Ratios 02/21/2018 32* 10.4 - 31.8 % Final  . UIBC 02/21/2018 172  ug/dL Final   Performed at [facility], [address], [place], [place] [address]  . Ferritin 02/21/2018 146  11 - 307 ng/mL Final   Performed at [facility], [address] [place], [place] [address]  . WBC 02/21/2018 7.9  4.0 - 10.5 K/uL Final  . RBC 02/21/2018 4.74  3.87 - 5.11 MIL/uL Final  . Hemoglobin 02/21/2018 13.1  12.0 - 15.0 g/dL Final  . HCT 02/21/2018 40.2  36.0 - 46.0 % Final  . MCV 02/21/2018 84.8  80.0 - 100.0 fL Final  . MCH 02/21/2018 27.6  26.0 - 34.0 pg Final  . MCHC 02/21/2018 32.6  30.0 - 36.0 g/dL Final  . RDW 02/21/2018 13.0  11.5 - 15.5 % Final  . Platelets 02/21/2018 275  150 - 400 K/uL Final  . nRBC 02/21/2018 0.0  0.0 - 0.2 % Final  . Neutrophils Relative % 02/21/2018 59  % Final  . Neutro Abs 02/21/2018 4.7  1.7 - 7.7 K/uL Final  . Lymphocytes Relative 02/21/2018 31  % Final  . Lymphs Abs 02/21/2018 2.4  0.7 - 4.0 K/uL Final  . Monocytes Relative 02/21/2018 10  % Final  . Monocytes Absolute 02/21/2018 0.8  0.1 - 1.0 K/uL Final  . Eosinophils Relative 02/21/2018 0  % Final  . Eosinophils Absolute 02/21/2018 0.0  0.0 - 0.5 K/uL Final  . Basophils Relative 02/21/2018 0  % Final  . Basophils Absolute 02/21/2018 0.0  0.0 - 0.1 K/uL Final  .  Immature Granulocytes 02/21/2018 0  % Final  . Abs Immature Granulocytes 02/21/2018 0.02  0.00 - 0.07 K/uL Final   Performed at [facility], [address], [place], [place] [address]    ASSESSMENT/PLAN 1. Iron deficiency anemia, unspecified iron deficiency anemia type   2. Personal history of tobacco use, presenting hazards to health   3. Pernicious anemia   4. Lung nodule    #Iron deficiency anemia, labs are reviewed and discussed with patient. Stable hemoglobin and iron panel.  Order additional IV iron at this point. She also can come off oral iron supplements.  We discussed again about the etiology of iron deficiency.  Patient declined GI referral for colonoscopy in the past. I discussed with patient and enc family she is doing well hopefully ourage patient to get Cologuard testing.  Patient will discuss with her primary care physician.  #Pernicious anemia, history of B12 deficiency, declined endoscopy and GI work-up.  Continue receiving parenteral B12 injections via PCP. #Smoking cessation discussed again with patient.  She follows up with Dr. Raul Del. #Lung nodules on 11/22/2017 CT lung cancer screening, benign appearance or behavior.  Continue annual lung cancer screening  Return of visit in 6 months with CBC, iron panel   Earlie Server, MD, PhD Hematology Oncology [facility] Pager- [phone] 02/24/2018

## 2018-03-11 DIAGNOSIS — H2512 Age-related nuclear cataract, left eye: Secondary | ICD-10-CM | POA: Insufficient documentation

## 2018-03-11 DIAGNOSIS — H25811 Combined forms of age-related cataract, right eye: Secondary | ICD-10-CM | POA: Insufficient documentation

## 2018-03-21 ENCOUNTER — Emergency Department
Admission: EM | Admit: 2018-03-21 | Discharge: 2018-03-21 | Disposition: A | Discharge status: Home / Self Care | Payer: Medicare Other | Attending: Emergency Medicine | Admitting: Emergency Medicine

## 2018-03-21 ENCOUNTER — Other Ambulatory Visit: Payer: Self-pay

## 2018-03-21 ENCOUNTER — Emergency Department: Payer: Medicare Other

## 2018-03-21 DIAGNOSIS — J111 Influenza due to unidentified influenza virus with other respiratory manifestations: Secondary | ICD-10-CM | POA: Diagnosis not present

## 2018-03-21 DIAGNOSIS — E876 Hypokalemia: Secondary | ICD-10-CM

## 2018-03-21 DIAGNOSIS — F17210 Nicotine dependence, cigarettes, uncomplicated: Secondary | ICD-10-CM | POA: Insufficient documentation

## 2018-03-21 DIAGNOSIS — R509 Fever, unspecified: Secondary | ICD-10-CM | POA: Diagnosis present

## 2018-03-21 DIAGNOSIS — J441 Chronic obstructive pulmonary disease with (acute) exacerbation: Secondary | ICD-10-CM

## 2018-03-21 DIAGNOSIS — Z79899 Other long term (current) drug therapy: Secondary | ICD-10-CM | POA: Diagnosis not present

## 2018-03-21 DIAGNOSIS — I10 Essential (primary) hypertension: Secondary | ICD-10-CM | POA: Insufficient documentation

## 2018-03-21 LAB — CBC WITH DIFFERENTIAL/PLATELET
Abs Immature Granulocytes: 0.02 10*3/uL (ref 0.00–0.07)
Basophils Absolute: 0 10*3/uL (ref 0.0–0.1)
Basophils Relative: 0 %
EOS ABS: 0 10*3/uL (ref 0.0–0.5)
Eosinophils Relative: 0 %
HCT: 39.6 % (ref 36.0–46.0)
Hemoglobin: 13.2 g/dL (ref 12.0–15.0)
Immature Granulocytes: 0 %
Lymphocytes Relative: 19 %
Lymphs Abs: 1.5 10*3/uL (ref 0.7–4.0)
MCH: 28.6 pg (ref 26.0–34.0)
MCHC: 33.3 g/dL (ref 30.0–36.0)
MCV: 85.9 fL (ref 80.0–100.0)
Monocytes Absolute: 1 10*3/uL (ref 0.1–1.0)
Monocytes Relative: 13 %
Neutro Abs: 5.3 10*3/uL (ref 1.7–7.7)
Neutrophils Relative %: 68 %
Platelets: 249 10*3/uL (ref 150–400)
RBC: 4.61 MIL/uL (ref 3.87–5.11)
RDW: 13.4 % (ref 11.5–15.5)
WBC: 7.8 10*3/uL (ref 4.0–10.5)
nRBC: 0 % (ref 0.0–0.2)

## 2018-03-21 LAB — INFLUENZA PANEL BY PCR (TYPE A & B)
INFLBPCR: NEGATIVE
Influenza A By PCR: POSITIVE — AB

## 2018-03-21 LAB — BASIC METABOLIC PANEL
ANION GAP: 10 (ref 5–15)
BUN: 8 mg/dL (ref 8–23)
CO2: 28 mmol/L (ref 22–32)
Calcium: 8.6 mg/dL — ABNORMAL LOW (ref 8.9–10.3)
Chloride: 98 mmol/L (ref 98–111)
Creatinine, Ser: 0.66 mg/dL (ref 0.44–1.00)
GFR calc Af Amer: >60 mL/min (ref >60)
GFR calc non Af Amer: >60 mL/min (ref >60)
Glucose, Bld: 109 mg/dL — ABNORMAL HIGH (ref 70–99)
POTASSIUM: 2.8 mmol/L — AB (ref 3.5–5.1)
Sodium: 136 mmol/L (ref 135–145)

## 2018-03-21 LAB — TROPONIN I: Troponin I: <0.03 ng/mL (ref <0.03)

## 2018-03-21 MED ORDER — OSELTAMIVIR PHOSPHATE 75 MG PO CAPS: 75.0000 mg | ORAL_CAPSULE | Freq: Two times a day (BID) | ORAL | 0 refills | Status: AC

## 2018-03-21 MED ORDER — IPRATROPIUM-ALBUTEROL 0.5-2.5 (3) MG/3ML IN SOLN
9.0000 mL | Freq: Once | RESPIRATORY_TRACT | Status: AC
  Administered 2018-03-21: 9 mL via RESPIRATORY_TRACT
  Filled 2018-03-21: qty 9

## 2018-03-21 MED ORDER — SODIUM CHLORIDE 0.9 % IV BOLUS
1000.0000 mL | Freq: Once | INTRAVENOUS | Status: AC
  Administered 2018-03-21: 1000 mL via INTRAVENOUS

## 2018-03-21 MED ORDER — METHYLPREDNISOLONE SODIUM SUCC 125 MG IJ SOLR
125.0000 mg | Freq: Once | INTRAMUSCULAR | Status: AC
  Administered 2018-03-21: 125 mg via INTRAVENOUS
  Filled 2018-03-21: qty 2

## 2018-03-21 MED ORDER — PREDNISONE 20 MG PO TABS: 40.0000 mg | ORAL_TABLET | Freq: Every day | ORAL | 0 refills | Status: AC

## 2018-03-21 MED ORDER — OSELTAMIVIR PHOSPHATE 75 MG PO CAPS
75.0000 mg | ORAL_CAPSULE | Freq: Once | ORAL | Status: AC
  Administered 2018-03-21: 75 mg via ORAL
  Filled 2018-03-21: qty 1

## 2018-03-21 MED ORDER — DOXYCYCLINE HYCLATE 100 MG PO CAPS: 100.0000 mg | ORAL_CAPSULE | Freq: Two times a day (BID) | ORAL | 0 refills | Status: DC

## 2018-03-21 MED ORDER — POTASSIUM CHLORIDE CRYS ER 20 MEQ PO TBCR
40.0000 meq | EXTENDED_RELEASE_TABLET | Freq: Once | ORAL | Status: AC
  Administered 2018-03-21: 40 meq via ORAL
  Filled 2018-03-21: qty 2

## 2018-03-21 MED ORDER — DOXYCYCLINE HYCLATE 100 MG PO TABS
100.0000 mg | ORAL_TABLET | Freq: Once | ORAL | Status: AC
  Administered 2018-03-21: 100 mg via ORAL
  Filled 2018-03-21: qty 1

## 2018-03-21 NOTE — ED Notes (Signed)
Peripheral IV discontinued. Catheter intact. No signs of infiltration or redness. Gauze applied to IV site.   Discharge instructions reviewed with patient. Questions fielded by this RN. Patient verbalizes understanding of instructions. Patient discharged home in stable condition per [name]. No acute distress noted at time of discharge.

## 2018-03-21 NOTE — ED Triage Notes (Signed)
Pt arrived via [facility] from [place] with c/o Flu-like symptoms. EMS states pt was at [facility] and they called EMS due to concerns that pt might have pnuemonia/sepsis. EMS states pt had temp of 101.1 en route but upon arrival temp 98.6. EMS states pt given tylenol before pt went to [facility].

## 2018-03-21 NOTE — ED Provider Notes (Signed)
[facility] Emergency Department Provider Note  ____________________________________________   None    (approximate)  I have reviewed the triage vital signs and the nursing notes.   HISTORY  Chief Complaint Flu-Like Symptoms   HPI Elizabeth [name] is a 67 y.o. female history of COPD was presented to the emergency department today with 1 day of fever.  She also reports cough, shortness of breath, a "scratchy throat" and a frontal headache.  Does not report any neck pain.  No known sick contacts.  Went to urgent care earlier today and was sent to the emergency department for further evaluation for concern for pneumonia.   Past Medical History:  Diagnosis Date  . Allergy   . Anxiety   . B12 deficiency   . Carpal tunnel syndrome   . COPD (chronic obstructive pulmonary disease) ([place])   . Depression   . Genetic testing    Common Cancers panel (47 genes) @ [facility] - No pathogenic mutations detected  . Hyperlipidemia   . Hypertension   . Iron deficiency anemia 10/16/2016  . Lipoma of skin   . Normocytic anemia 10/16/2016  . QT prolongation   . Substance abuse [facility])     Patient Active Problem List   Diagnosis Date Noted  . Substance abuse ([place])   . Genetic testing   . Pernicious anemia 11/27/2016  . Normocytic anemia 10/16/2016  . Iron deficiency anemia 10/16/2016  . COPD (chronic obstructive pulmonary disease) with emphysema ([place])   . Major depressive disorder, recurrent, moderate ([place]) 09/24/2016  . 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  . Bilateral carpal tunnel syndrome 05/27/2015  . Bilateral hand numbness 05/17/2015  . Fracture of humerus, proximal, closed 03/23/2013  . Chronic sinusitis, unspecified 02/13/2012  . Lipoma of skin and subcutaneous tissue 12/26/2011  . B12 deficiency 02/15/2011  . 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 Dyspepsia and other specified disorders of function of stomach 10/11/2010  . Benign essential hypertension  10/11/2010  . Mixed hyperlipidemia 10/11/2010  . Tobacco use disorder 10/11/2010  . Anxiety state 08/25/2009  . Major depressive disorder, recurrent episode, moderate ([place]) 08/25/2009  . Shortness of breath 07/24/2006  . Essential hypertension 06/14/2003    Past Surgical History:  Procedure Laterality Date  . ABDOMINAL HYSTERECTOMY    . BREAST EXCISIONAL BIOPSY Left   . CESAREAN SECTION    . TUBAL LIGATION      Prior to Admission medications   Medication Sig Start Date End Date Taking? Authorizing Provider  albuterol (PROAIR HFA) 108 (90 Base) MCG/ACT inhaler Frequency:Q4HPRN   Dosage:2   PUFFS  Instructions:  Note:Dose: 2 PUFFS 06/06/10   [provider]  albuterol (PROVENTIL) (2.5 MG/3ML) 0.083% nebulizer solution Inhale into the lungs. 08/30/16   [provider]  albuterol (PROVENTIL) (2.5 MG/3ML) 0.083% nebulizer solution Inhale into the lungs. 08/30/16   [provider]  amLODipine (NORVASC) 10 MG tablet 5 mg.  09/17/16   [provider]  azithromycin (ZITHROMAX) 250 MG tablet Take by mouth daily.    [provider]  budesonide-formoterol (SYMBICORT) 160-4.5 MCG/ACT inhaler Inhale into the lungs. 09/05/16   [provider]  busPIRone (BUSPAR) 5 MG tablet 1-2 tabs BID for anxiety/agitation. 02/12/17   [provider]  cyanocobalamin (,VITAMIN B-12,) 1000 MCG/ML injection Inject into the muscle every 30 (thirty) days.  11/18/14 07/25/18  [provider]  cyclobenzaprine (FLEXERIL) 10 MG tablet  03/24/17   [provider]  escitalopram (LEXAPRO) 20 MG tablet Take 20 mg  by mouth daily.    [provider]  fluticasone Asencion Islam) 50 MCG/ACT nasal spray  10/10/16   [provider]  FLUZONE HIGH-DOSE 0.5 ML injection inject 0.5 milliliter intramuscularly 01/15/17   [provider]  furosemide (LASIX) 20 MG tablet Take 20 mg by mouth daily. 08/09/17   [provider]  lisinopril  (PRINIVIL,ZESTRIL) 10 MG tablet Take by mouth. 09/05/16   [provider]  montelukast (SINGULAIR) 10 MG tablet Take by mouth. 09/03/17 09/03/18  [provider]  Multiple Vitamin (MULTI-VITAMINS) TABS Take by mouth.    [provider]  omeprazole (PRILOSEC OTC) 20 MG tablet Take by mouth. 08/25/09   [provider]  sertraline (ZOLOFT) 100 MG tablet Take by mouth. 09/24/16   [provider]  simvastatin (ZOCOR) 80 MG tablet Take by mouth. 10/12/15   [provider]  spironolactone (ALDACTONE) 25 MG tablet Take by mouth. 09/05/16   [provider]  tiotropium (SPIRIVA HANDIHALER) 18 MCG inhalation capsule inhale contents of 1 capsule by mouth once daily 09/09/16   [provider]  venlafaxine XR (EFFEXOR-XR) 150 MG 24 hr capsule TK 1 C PO ONCE D 12/27/17   [provider]    Allergies Levofloxacin; Tetracycline; and Amoxicillin-pot clavulanate  Family History  Problem Relation Age of Onset  . Ovarian cancer Mother 25       deceased 35  . Breast cancer Mother 71  . Stroke Father   . Lung cancer Paternal Aunt   . Lung cancer Cousin     Social History Social History   Tobacco Use  . Smoking status: Current Every Day Smoker    Packs/day: 1.00    Years: 52.00    Pack years: 52.00    Types: Cigarettes  . Smokeless tobacco: Never Used  Substance Use Topics  . Alcohol use: No  . Drug use: No    Review of Systems  Constitutional: No fever/chills Eyes: No visual changes. ENT: No sore throat. Cardiovascular: Denies chest pain. Respiratory: As above Gastrointestinal: No abdominal pain.  No nausea, no vomiting.  No diarrhea.  No constipation. Genitourinary: Negative for dysuria. Musculoskeletal: Negative for back pain. Skin: Negative for rash. Neurological: Negative for focal weakness or numbness.   ____________________________________________   PHYSICAL EXAM:  VITAL SIGNS: ED Triage Vitals  Enc Vitals  Group     BP 03/21/18 2021 (!) 158/91     Pulse Rate 03/21/18 2021 (!) 109     Resp 03/21/18 2021 20     Temp 03/21/18 2021 98.6 F (37 C)     Temp Source 03/21/18 2021 Oral     SpO2 03/21/18 2021 96 %     Weight 03/21/18 2022 170 lb (77.1 kg)     Height 03/21/18 2022 5\' 2"  (1.575 m)     Head Circumference --      Peak Flow --      Pain Score 03/21/18 2022 7     Pain Loc --      Pain Edu? --      Excl. in [place]? --     Constitutional: Alert and oriented. Well appearing and in no acute distress. Eyes: Conjunctivae are normal.  Head: Atraumatic.  No tenderness to palpation over the sinuses. Nose: No congestion/rhinnorhea. Mouth/Throat: Mucous membranes are moist.  No pharyngeal erythema.  No tonsillar swelling or exudate. Neck: No stridor.  No meningismus.  Patient ranges her head neck freely. Cardiovascular: Tachycardic, regular rhythm. Grossly normal heart sounds.   Respiratory:  Normal respiratory effort.  No retractions.  Rhonchorous wheezing throughout with a prolonged expiratory phase.  Speaks in full sentences. Gastrointestinal: Soft and nontender. No distention. Musculoskeletal: No lower extremity tenderness nor edema.  No joint effusions. Neurologic:  Normal speech and language. No gross focal neurologic deficits are appreciated. Skin:  Skin is warm, dry and intact. No rash noted. Psychiatric: Mood and affect are normal. Speech and behavior are normal.  ____________________________________________   LABS (all labs ordered are listed, but only abnormal results are displayed)  Labs Reviewed  INFLUENZA PANEL BY PCR (TYPE A & B) - Abnormal; Notable for the following components:      Result Value   Influenza A By PCR POSITIVE (*)    All other components within normal limits  BASIC METABOLIC PANEL - Abnormal; Notable for the following components:   Potassium 2.8 (*)    Glucose, Bld 109 (*)    Calcium 8.6 (*)    All other components within normal limits  CBC WITH  DIFFERENTIAL/PLATELET  TROPONIN I   ____________________________________________  EKG  ED ECG REPORT I, Doran Stabler, the attending physician, personally viewed and interpreted this ECG.   Date: 03/21/2018  EKG Time: 1341  Rate: 85  Rhythm: normal sinus rhythm  Axis: Normal  Intervals:Prolonged QTC at 504  ST&T Change: No ST segment elevation or depression.  No abnormal T wave inversion.    ______________________________________ _____  RADIOLOGY  Chest x-ray without acute disease. ______ ______________________________________   PROCEDURES  Procedure(s) performed:   Procedures  Critical Care performed:   ____________________________________________   INITIAL IMPRESSION / ASSESSMENT AND PLAN / ED COURSE  Pertinent labs & imaging results that were available during my care of the patient were reviewed by me and considered in my medical decision making (see chart for details).  Differential includes, but is not limited to, viral syndrome, bronchitis including COPD exacerbation, pneumonia, reactive airway disease including asthma, CHF including exacerbation with or without pulmonary/interstitial edema, pneumothorax, ACS, thoracic trauma, and pulmonary embolism. As part of my medical decision making, I reviewed the following data within the electronic MEDICAL RECORD NUMBER Notes from prior ED visits  Patient appears well.  Flulike symptoms.  Frontal headache without neck pain or altered mental status or meningismus.  Headache likely secondary to viral illness.  Patient to receive nebulizer treatment as well as fluids and steroids.  Will reassess.  ----------------------------------------- 9:39 PM on 03/21/2018 -----------------------------------------  Patient at this time feels improved after neb treatments and steroids.  Re-auscultated her lungs and she is clear throughout all fields.  Heart rate of 105.  Likely tachycardic at this time secondary to recent beta agonists.   Oxygen saturation 90 to 95% on room air.  Patient with persistent headache, likely related to diagnosis of flu.  Because of her COPD and wheezing we will treat her with steroids, doxycycline and Tamiflu for her flu symptoms.  We will also replete her potassium.  Patient and daughter understanding of the diagnosis well treatment plan and willing to comply. ____________________________________________   FINAL CLINICAL IMPRESSION(S) / ED DIAGNOSES  COPD exacerbation.  Hypokalemia.  Influenza.    NEW MEDICATIONS STARTED DURING THIS VISIT:  New Prescriptions   No medications on file     Note:  This document was prepared using Dragon voice recognition software and may include unintentional dictation errors.     Orbie Pyo, MD 03/21/18 2142

## 2018-03-21 NOTE — ED Notes (Signed)
Patient transported to X-ray 

## 2018-04-11 ENCOUNTER — Telehealth: Payer: Self-pay | Admitting: *Deleted

## 2018-04-11 NOTE — Telephone Encounter (Signed)
Asking if patient can have labs checked today as she is feeling fatigued. Elizabeth [name] has an appointment at 2 today. Please advise

## 2018-08-22 ENCOUNTER — Inpatient Hospital Stay: Payer: Medicare Other | Attending: Oncology

## 2018-08-22 ENCOUNTER — Other Ambulatory Visit: Payer: Self-pay

## 2018-08-22 ENCOUNTER — Other Ambulatory Visit: Payer: Self-pay | Admitting: *Deleted

## 2018-08-22 DIAGNOSIS — F419 Anxiety disorder, unspecified: Secondary | ICD-10-CM | POA: Insufficient documentation

## 2018-08-22 DIAGNOSIS — E785 Hyperlipidemia, unspecified: Secondary | ICD-10-CM | POA: Insufficient documentation

## 2018-08-22 DIAGNOSIS — J449 Chronic obstructive pulmonary disease, unspecified: Secondary | ICD-10-CM | POA: Insufficient documentation

## 2018-08-22 DIAGNOSIS — G5600 Carpal tunnel syndrome, unspecified upper limb: Secondary | ICD-10-CM | POA: Diagnosis not present

## 2018-08-22 DIAGNOSIS — F329 Major depressive disorder, single episode, unspecified: Secondary | ICD-10-CM | POA: Insufficient documentation

## 2018-08-22 DIAGNOSIS — Z79899 Other long term (current) drug therapy: Secondary | ICD-10-CM | POA: Diagnosis not present

## 2018-08-22 DIAGNOSIS — Z803 Family history of malignant neoplasm of breast: Secondary | ICD-10-CM | POA: Insufficient documentation

## 2018-08-22 DIAGNOSIS — D509 Iron deficiency anemia, unspecified: Secondary | ICD-10-CM

## 2018-08-22 DIAGNOSIS — Z7951 Long term (current) use of inhaled steroids: Secondary | ICD-10-CM | POA: Diagnosis not present

## 2018-08-22 DIAGNOSIS — F17210 Nicotine dependence, cigarettes, uncomplicated: Secondary | ICD-10-CM | POA: Diagnosis not present

## 2018-08-22 DIAGNOSIS — I10 Essential (primary) hypertension: Secondary | ICD-10-CM | POA: Diagnosis not present

## 2018-08-22 DIAGNOSIS — E538 Deficiency of other specified B group vitamins: Secondary | ICD-10-CM | POA: Insufficient documentation

## 2018-08-22 LAB — CBC WITH DIFFERENTIAL/PLATELET
Abs Immature Granulocytes: 0.04 10*3/uL (ref 0.00–0.07)
Basophils Absolute: 0 10*3/uL (ref 0.0–0.1)
Basophils Relative: 0 %
Eosinophils Absolute: 0 10*3/uL (ref 0.0–0.5)
Eosinophils Relative: 0 %
HCT: 38.3 % (ref 36.0–46.0)
Hemoglobin: 13 g/dL (ref 12.0–15.0)
Immature Granulocytes: 1 %
Lymphocytes Relative: 30 %
Lymphs Abs: 2.2 10*3/uL (ref 0.7–4.0)
MCH: 29.5 pg (ref 26.0–34.0)
MCHC: 33.9 g/dL (ref 30.0–36.0)
MCV: 87 fL (ref 80.0–100.0)
Monocytes Absolute: 0.8 10*3/uL (ref 0.1–1.0)
Monocytes Relative: 10 %
Neutro Abs: 4.2 10*3/uL (ref 1.7–7.7)
Neutrophils Relative %: 59 %
Platelets: 257 10*3/uL (ref 150–400)
RBC: 4.4 MIL/uL (ref 3.87–5.11)
RDW: 13.1 % (ref 11.5–15.5)
WBC: 7.2 10*3/uL (ref 4.0–10.5)
nRBC: 0 % (ref 0.0–0.2)

## 2018-08-22 LAB — IRON AND TIBC
Iron: 74 ug/dL (ref 28–170)
Saturation Ratios: 29 % (ref 10.4–31.8)
TIBC: 254 ug/dL (ref 250–450)
UIBC: 180 ug/dL

## 2018-08-22 LAB — FERRITIN: Ferritin: 165 ng/mL (ref 11–307)

## 2018-08-23 ENCOUNTER — Encounter: Payer: Self-pay | Admitting: Oncology

## 2018-08-26 ENCOUNTER — Inpatient Hospital Stay (HOSPITAL_BASED_OUTPATIENT_CLINIC_OR_DEPARTMENT_OTHER): Payer: Medicare Other | Admitting: Oncology

## 2018-08-26 ENCOUNTER — Encounter: Payer: Self-pay | Admitting: Oncology

## 2018-08-26 ENCOUNTER — Other Ambulatory Visit: Payer: Self-pay

## 2018-08-26 ENCOUNTER — Other Ambulatory Visit: Payer: Medicare Other

## 2018-08-26 DIAGNOSIS — F419 Anxiety disorder, unspecified: Secondary | ICD-10-CM

## 2018-08-26 DIAGNOSIS — E538 Deficiency of other specified B group vitamins: Secondary | ICD-10-CM | POA: Diagnosis not present

## 2018-08-26 DIAGNOSIS — J449 Chronic obstructive pulmonary disease, unspecified: Secondary | ICD-10-CM

## 2018-08-26 DIAGNOSIS — I10 Essential (primary) hypertension: Secondary | ICD-10-CM

## 2018-08-26 DIAGNOSIS — D509 Iron deficiency anemia, unspecified: Secondary | ICD-10-CM

## 2018-08-26 DIAGNOSIS — F17210 Nicotine dependence, cigarettes, uncomplicated: Secondary | ICD-10-CM | POA: Diagnosis not present

## 2018-08-26 DIAGNOSIS — Z7951 Long term (current) use of inhaled steroids: Secondary | ICD-10-CM

## 2018-08-26 DIAGNOSIS — E785 Hyperlipidemia, unspecified: Secondary | ICD-10-CM

## 2018-08-26 DIAGNOSIS — G5600 Carpal tunnel syndrome, unspecified upper limb: Secondary | ICD-10-CM

## 2018-08-26 DIAGNOSIS — Z803 Family history of malignant neoplasm of breast: Secondary | ICD-10-CM

## 2018-08-26 DIAGNOSIS — F329 Major depressive disorder, single episode, unspecified: Secondary | ICD-10-CM

## 2018-08-26 DIAGNOSIS — D510 Vitamin B12 deficiency anemia due to intrinsic factor deficiency: Secondary | ICD-10-CM

## 2018-08-26 DIAGNOSIS — Z79899 Other long term (current) drug therapy: Secondary | ICD-10-CM

## 2018-08-26 NOTE — Progress Notes (Signed)
Called patient for Telehealth via [place].  Patient states no new concerns today

## 2018-09-01 NOTE — Progress Notes (Signed)
HEMATOLOGY-ONCOLOGY TeleHEALTH VISIT PROGRESS NOTE  I connected with Elizabeth [name] on 09/01/18 at 10:30 AM EDT by video enabled telemedicine visit and verified that I am speaking with the correct person using two identifiers. I discussed the limitations, risks, security and privacy concerns of performing an evaluation and management service by telemedicine and the availability of in-person appointments. I also discussed with the patient that there may be a patient responsible charge related to this service. The patient expressed understanding and agreed to proceed.   Other persons participating in the visit and their role in the encounter:  Geraldine Solar, [place], check in patient   Janeann Merl, RN, check in patient.   Patient's location: Home  Provider's location: work Risk analyst Complaint: iron deficiency anemia follow up .     INTERVAL HISTORY Elizabeth [name] is a 68 y.o. female who has above history reviewed by me presents for  follow-up of iron deficiency anemia and pernicious anemia.   Patient reports feeling well at baseline.  No new complaints.  She gets vitamin B12 injections through primary care provider.  Review of Systems  Constitutional: Negative for appetite change, chills, fatigue and fever.  HENT:   Negative for hearing loss and voice change.   Eyes: Negative for eye problems.  Respiratory: Negative for chest tightness and cough.   Cardiovascular: Negative for chest pain.  Gastrointestinal: Negative for abdominal distention, abdominal pain and blood in stool.  Endocrine: Negative for hot flashes.  Genitourinary: Negative for difficulty urinating and frequency.   Musculoskeletal: Negative for arthralgias.  Skin: Negative for itching and rash.  Neurological: Negative for extremity weakness.  Hematological: Negative for adenopathy.  Psychiatric/Behavioral: Negative for confusion.    Past Medical History:  Diagnosis Date  . Allergy   . Anxiety   . B12 deficiency   .  Carpal tunnel syndrome   . COPD (chronic obstructive pulmonary disease) ([place])   . Depression   . Genetic testing    Common Cancers panel (47 genes) @ [facility] - No pathogenic mutations detected  . Hyperlipidemia   . Hypertension   . Iron deficiency anemia 10/16/2016  . Lipoma of skin   . Normocytic anemia 10/16/2016  . QT prolongation   . Substance abuse [facility])    Past Surgical History:  Procedure Laterality Date  . ABDOMINAL HYSTERECTOMY    . BREAST EXCISIONAL BIOPSY Left   . CESAREAN SECTION    . TUBAL LIGATION      Family History  Problem Relation Age of Onset  . Ovarian cancer Mother 45       deceased 22  . Breast cancer Mother 43  . Stroke Father   . Lung cancer Paternal Aunt   . Lung cancer Cousin     Social History   Socioeconomic History  . Marital status: Divorced    Spouse name: Not on file  . Number of children: Not on file  . Years of education: Not on file  . Highest education level: Not on file  Occupational History  . Not on file  Social Needs  . Financial resource strain: Not on file  . Food insecurity:    Worry: Not on file    Inability: Not on file  . Transportation needs:    Medical: Not on file    Non-medical: Not on file  Tobacco Use  . Smoking status: Current Every Day Smoker    Packs/day: 1.00    Years: 52.00    Pack years: 52.00    Types: Cigarettes  .  Smokeless tobacco: Never Used  Substance and Sexual Activity  . Alcohol use: No  . Drug use: No  . Sexual activity: Not Currently  Lifestyle  . Physical activity:    Days per week: Not on file    Minutes per session: Not on file  . Stress: Not on file  Relationships  . Social connections:    Talks on phone: Not on file    Gets together: Not on file    Attends religious service: Not on file    Active member of club or organization: Not on file    Attends meetings of clubs or organizations: Not on file    Relationship status: Not on file  . Intimate partner violence:    Fear of  current or ex partner: Not on file    Emotionally abused: Not on file    Physically abused: Not on file    Forced sexual activity: Not on file  Other Topics Concern  . Not on file  Social History Narrative  . Not on file    Current Outpatient Medications on File Prior to Visit  Medication Sig Dispense Refill  . albuterol (PROAIR HFA) 108 (90 Base) MCG/ACT inhaler Frequency:Q4HPRN   Dosage:2   PUFFS  Instructions:  Note:Dose: 2 PUFFS    . albuterol (PROVENTIL) (2.5 MG/3ML) 0.083% nebulizer solution Inhale into the lungs.    Mar[place] Kitchen albuterol (PROVENTIL) (2.5 MG/3ML) 0.083% nebulizer solution Inhale into the lungs.    Marland Kitchen amLODipine (NORVASC) 10 MG tablet 5 mg.     . budesonide-formoterol (SYMBICORT) 160-4.5 MCG/ACT inhaler Inhale into the lungs.    . cyclobenzaprine (FLEXERIL) 10 MG tablet   0  . fluticasone (FLONASE) 50 MCG/ACT nasal spray   1  . furosemide (LASIX) 20 MG tablet Take 20 mg by mouth daily.  0  . lisinopril (PRINIVIL,ZESTRIL) 10 MG tablet Take by mouth.    . magnesium oxide (MAG-OX) 400 MG tablet Take 400 mg by mouth daily.    . Multiple Vitamin (MULTI-VITAMINS) TABS Take by mouth.    Marland Kitchen omeprazole (PRILOSEC OTC) 20 MG tablet Take by mouth.    . simvastatin (ZOCOR) 80 MG tablet Take by mouth.    . spironolactone (ALDACTONE) 25 MG tablet Take by mouth.    . tiotropium (SPIRIVA HANDIHALER) 18 MCG inhalation capsule inhale contents of 1 capsule by mouth once daily    . venlafaxine XR (EFFEXOR-XR) 150 MG 24 hr capsule TK 1 C PO ONCE D  2  . amLODipine (NORVASC) 5 MG tablet Take 5 mg by mouth daily.    Marland Kitchen FLUZONE HIGH-DOSE 0.5 ML injection inject 0.5 milliliter intramuscularly  0  . potassium chloride (KLOR-CON) 20 MEQ packet Take 20 mEq by mouth daily.     No current facility-administered medications on file prior to visit.     Allergies  Allergen Reactions  . Levofloxacin Other (See Comments)    Other reaction(s): Muscle Pain Other reaction(s): Muscle Pain  . Tetracycline Other  (See Comments)  . Amoxicillin-Pot Clavulanate Nausea Only and Other (See Comments)    Other Reaction: DIARRHEA. Other reaction(s): Other (See Comments) Other Reaction: DIARRHEA. Other Reaction: DIARRHEA.  Other reaction(s): Other (See Comments) Other Reaction: DIARRHEA.       Observations/Objective: There were no vitals filed for this visit. There is no height or weight on file to calculate BMI.  Physical Exam  Constitutional: She is oriented to person, place, and time. No distress.  HENT:  Head: Normocephalic and atraumatic.  Pulmonary/Chest: Effort  normal.  Neurological: She is alert and oriented to person, place, and time.  Psychiatric: Affect normal.    CBC    Component Value Date/Time   WBC 7.2 08/22/2018 1114   RBC 4.40 08/22/2018 1114   HGB 13.0 08/22/2018 1114   HGB 13.2 05/06/2014 1028   HCT 38.3 08/22/2018 1114   HCT 40.0 05/06/2014 1028   PLT 257 08/22/2018 1114   PLT 331 05/06/2014 1028   MCV 87.0 08/22/2018 1114   MCV 84 05/06/2014 1028   MCH 29.5 08/22/2018 1114   MCHC 33.9 08/22/2018 1114   RDW 13.1 08/22/2018 1114   RDW 13.4 05/06/2014 1028   LYMPHSABS 2.2 08/22/2018 1114   MONOABS 0.8 08/22/2018 1114   EOSABS 0.0 08/22/2018 1114   BASOSABS 0.0 08/22/2018 1114    CMP     Component Value Date/Time   NA 136 03/21/2018 2025   NA 140 05/06/2014 1028   K 2.8 (L) 03/21/2018 2025   K 3.5 05/06/2014 1028   CL 98 03/21/2018 2025   CL 105 05/06/2014 1028   CO2 28 03/21/2018 2025   CO2 27 05/06/2014 1028   GLUCOSE 109 (H) 03/21/2018 2025   GLUCOSE 101 (H) 05/06/2014 1028   BUN 8 03/21/2018 2025   BUN 6 (L) 05/06/2014 1028   CREATININE 0.66 03/21/2018 2025   CREATININE 0.79 05/06/2014 1028   CALCIUM 8.6 (L) 03/21/2018 2025   CALCIUM 8.8 05/06/2014 1028   PROT 7.6 05/27/2017 1134   ALBUMIN 4.5 05/27/2017 1134   AST 20 05/27/2017 1134   ALT 16 05/27/2017 1134   ALKPHOS 74 05/27/2017 1134   BILITOT 0.5 05/27/2017 1134   GFRNONAA >60 03/21/2018  2025   GFRNONAA >60 05/06/2014 1028   GFRAA >60 03/21/2018 2025   GFRAA >60 05/06/2014 1028     Assessment and Plan: 1. Pernicious anemia   2. Iron deficiency anemia, unspecified iron deficiency anemia type     Labs are reviewed and discussed with patient. Iron panel is stable. Ferritin 165, iron saturation is 29.  No need for additional IV iron at this point.   Pernicious anemia, and history of vitamin B12 deficiency, declines endoscopy and GI work up . Continue B12 injections via PCP.   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 Explained to patient that the value is still within normal limites, and levels can fluctuate to infection, inflammation,  Patient is still quite concerned. Repeat cbc in 2 weeks and also add smear.    Follow Up Instructions: Lab in 2 weeks, Follow up in 1 year.    I discussed the assessment and treatment plan with the patient. The patient was provided an opportunity to ask questions and all were answered. The patient agreed with the plan and demonstrated an understanding of the instructions.  The patient was advised to call back or seek an in-person evaluation if the symptoms worsen or if the condition fails to improve as anticipated.   I provided 15 minutes of face-to-face video visit time during this encounter, and > 50% was spent counseling as documented under my assessment & plan.  Earlie Server, MD 09/01/2018 7:06 PM

## 2018-09-08 ENCOUNTER — Other Ambulatory Visit: Payer: Self-pay

## 2018-09-09 ENCOUNTER — Other Ambulatory Visit: Payer: Self-pay

## 2018-09-09 ENCOUNTER — Inpatient Hospital Stay: Payer: Medicare Other | Attending: Oncology

## 2018-09-09 ENCOUNTER — Encounter: Payer: Self-pay | Admitting: Oncology

## 2018-09-09 DIAGNOSIS — D509 Iron deficiency anemia, unspecified: Secondary | ICD-10-CM | POA: Diagnosis present

## 2018-09-09 DIAGNOSIS — D510 Vitamin B12 deficiency anemia due to intrinsic factor deficiency: Secondary | ICD-10-CM | POA: Insufficient documentation

## 2018-09-09 LAB — CBC WITH DIFFERENTIAL/PLATELET
Abs Immature Granulocytes: 0.03 10*3/uL (ref 0.00–0.07)
Basophils Absolute: 0 10*3/uL (ref 0.0–0.1)
Basophils Relative: 0 %
Eosinophils Absolute: 0 10*3/uL (ref 0.0–0.5)
Eosinophils Relative: 0 %
HCT: 39.2 % (ref 36.0–46.0)
Hemoglobin: 12.9 g/dL (ref 12.0–15.0)
Immature Granulocytes: 0 %
Lymphocytes Relative: 30 %
Lymphs Abs: 2.6 10*3/uL (ref 0.7–4.0)
MCH: 29.2 pg (ref 26.0–34.0)
MCHC: 32.9 g/dL (ref 30.0–36.0)
MCV: 88.7 fL (ref 80.0–100.0)
Monocytes Absolute: 0.7 10*3/uL (ref 0.1–1.0)
Monocytes Relative: 8 %
Neutro Abs: 5.3 10*3/uL (ref 1.7–7.7)
Neutrophils Relative %: 62 %
Platelets: 277 10*3/uL (ref 150–400)
RBC: 4.42 MIL/uL (ref 3.87–5.11)
RDW: 13.2 % (ref 11.5–15.5)
WBC: 8.6 10*3/uL (ref 4.0–10.5)
nRBC: 0 % (ref 0.0–0.2)

## 2018-09-09 LAB — TECHNOLOGIST SMEAR REVIEW

## 2018-12-01 ENCOUNTER — Telehealth: Payer: Self-pay | Admitting: *Deleted

## 2018-12-01 DIAGNOSIS — Z122 Encounter for screening for malignant neoplasm of respiratory organs: Secondary | ICD-10-CM

## 2018-12-01 DIAGNOSIS — Z87891 Personal history of nicotine dependence: Secondary | ICD-10-CM

## 2018-12-01 NOTE — Telephone Encounter (Signed)
Patient has been notified that lung cancer screening CT scan is currently due or will be due in the near future. Confirmed that patient is within appropriate age range and asymptomatic (no signs or symptoms of lung cancer). Patient denies significant change in medical history or illness that would prevent curative treatment for lung cancer if found. Verified smoking history (current smoker with 1 ppd history, patient states she has cut back to <1 ppd most recently). Patient prefers scan not be scheduled early in the morning.

## 2018-12-02 NOTE — Telephone Encounter (Signed)
52.75 pack year, current smoker

## 2018-12-02 NOTE — Addendum Note (Signed)
Addended by: Lieutenant Diego on: 12/02/2018 12:19 PM   Modules accepted: Orders

## 2018-12-10 ENCOUNTER — Ambulatory Visit
Admission: RE | Admit: 2018-12-10 | Discharge: 2018-12-10 | Disposition: A | Discharge status: Home / Self Care | Payer: Medicare Other | Source: Ambulatory Visit | Attending: Nurse Practitioner | Admitting: Nurse Practitioner

## 2018-12-10 ENCOUNTER — Telehealth: Payer: Self-pay | Admitting: *Deleted

## 2018-12-10 ENCOUNTER — Other Ambulatory Visit: Payer: Self-pay

## 2018-12-10 DIAGNOSIS — Z87891 Personal history of nicotine dependence: Secondary | ICD-10-CM | POA: Diagnosis present

## 2018-12-10 DIAGNOSIS — Z122 Encounter for screening for malignant neoplasm of respiratory organs: Secondary | ICD-10-CM

## 2018-12-10 NOTE — Telephone Encounter (Signed)
Notified patient of LDCT lung cancer screening program results with recommendation for 6 month follow up imaging. Also notified of incidental findings noted below and is encouraged to discuss further with PCP who will receive a copy of this note and/or the CT report. Patient verbalizes understanding.   IMPRESSION: 1. New 5.8 mm left lower lobe nodule. Lung-RADS 3, probably benign findings. Short-term follow-up in 6 months is recommended with repeat low-dose chest CT without contrast (please use the following order, "CT CHEST LCS NODULE FOLLOW-UP W/O CM"). These results will be called to the ordering clinician or representative by the Radiologist Assistant, and communication documented in the PACS or zVision Dashboard. 2. Hepatic steatosis. 3. Aortic atherosclerosis (ICD10-170.0). Coronary artery calcification. 4.  Emphysema (ICD10-J43.9).

## 2019-05-01 ENCOUNTER — Telehealth: Payer: Self-pay | Admitting: *Deleted

## 2019-05-01 DIAGNOSIS — R918 Other nonspecific abnormal finding of lung field: Secondary | ICD-10-CM

## 2019-05-01 DIAGNOSIS — Z87891 Personal history of nicotine dependence: Secondary | ICD-10-CM

## 2019-05-01 NOTE — Telephone Encounter (Signed)
Contacted regarding scheduling LCS nodule follow up scan.

## 2019-05-25 ENCOUNTER — Encounter: Payer: Self-pay | Admitting: Oncology

## 2019-06-10 ENCOUNTER — Ambulatory Visit
Admission: RE | Admit: 2019-06-10 | Discharge: 2019-06-10 | Disposition: A | Discharge status: Home / Self Care | Payer: Medicare PPO | Source: Ambulatory Visit | Attending: Oncology | Admitting: Oncology

## 2019-06-10 ENCOUNTER — Other Ambulatory Visit: Payer: Self-pay

## 2019-06-10 DIAGNOSIS — Z87891 Personal history of nicotine dependence: Secondary | ICD-10-CM | POA: Diagnosis not present

## 2019-06-10 DIAGNOSIS — R918 Other nonspecific abnormal finding of lung field: Secondary | ICD-10-CM | POA: Diagnosis present

## 2019-06-10 NOTE — Progress Notes (Signed)
FYI

## 2019-06-11 ENCOUNTER — Other Ambulatory Visit: Payer: Self-pay | Admitting: *Deleted

## 2019-06-11 DIAGNOSIS — R911 Solitary pulmonary nodule: Secondary | ICD-10-CM

## 2019-06-12 ENCOUNTER — Other Ambulatory Visit: Payer: Self-pay | Admitting: Oncology

## 2019-06-12 NOTE — Progress Notes (Signed)
Re: Pet Scan   LDCT Screening  From 06/10/19 revealed:  IMPRESSION: 1. Enlarging left lower lobe nodule, highly worrisome for primary bronchogenic carcinoma. Lung-RADS 4B, suspicious. Additional imaging evaluation or consultation with Pulmonology or Thoracic Surgery recommended. These results will be called to the ordering clinician or representative by the Radiologist Assistant, and communication documented in the PACS or zVision Dashboard. 2. Hepatic steatosis. 3. Cholelithiasis. 4. Aortic atherosclerosis (ICD10-I70.0). Coronary artery calcification. 5.  Emphysema (ICD10-J43.9).  Recommend PET scan for further evaluation  Faythe Casa, NP 06/12/2019 9:09 AM

## 2019-06-18 ENCOUNTER — Other Ambulatory Visit: Payer: Self-pay

## 2019-06-18 ENCOUNTER — Ambulatory Visit
Admission: RE | Admit: 2019-06-18 | Discharge: 2019-06-18 | Disposition: A | Discharge status: Home / Self Care | Payer: Medicare PPO | Source: Ambulatory Visit | Attending: Oncology | Admitting: Oncology

## 2019-06-18 DIAGNOSIS — K8020 Calculus of gallbladder without cholecystitis without obstruction: Secondary | ICD-10-CM | POA: Diagnosis not present

## 2019-06-18 DIAGNOSIS — K760 Fatty (change of) liver, not elsewhere classified: Secondary | ICD-10-CM | POA: Insufficient documentation

## 2019-06-18 DIAGNOSIS — R911 Solitary pulmonary nodule: Secondary | ICD-10-CM | POA: Insufficient documentation

## 2019-06-18 LAB — GLUCOSE, CAPILLARY: Glucose-Capillary: 117 mg/dL — ABNORMAL HIGH (ref 70–99)

## 2019-06-18 MED ORDER — FLUDEOXYGLUCOSE F - 18 (FDG) INJECTION
9.0000 | Freq: Once | INTRAVENOUS | Status: AC | PRN
  Administered 2019-06-18: 09:00:00 9.25 via INTRAVENOUS

## 2019-06-19 ENCOUNTER — Inpatient Hospital Stay: Payer: Medicare PPO

## 2019-06-19 ENCOUNTER — Encounter: Payer: Self-pay | Admitting: Oncology

## 2019-06-19 ENCOUNTER — Encounter: Payer: Self-pay | Admitting: *Deleted

## 2019-06-19 ENCOUNTER — Inpatient Hospital Stay: Payer: Medicare PPO | Attending: Oncology | Admitting: Oncology

## 2019-06-19 VITALS — BP 131/83 | HR 92 | Temp 97.0°F | Resp 20 | Wt 190.6 lb

## 2019-06-19 DIAGNOSIS — J439 Emphysema, unspecified: Secondary | ICD-10-CM | POA: Insufficient documentation

## 2019-06-19 DIAGNOSIS — D510 Vitamin B12 deficiency anemia due to intrinsic factor deficiency: Secondary | ICD-10-CM | POA: Diagnosis not present

## 2019-06-19 DIAGNOSIS — F329 Major depressive disorder, single episode, unspecified: Secondary | ICD-10-CM | POA: Insufficient documentation

## 2019-06-19 DIAGNOSIS — R911 Solitary pulmonary nodule: Secondary | ICD-10-CM | POA: Diagnosis present

## 2019-06-19 DIAGNOSIS — K760 Fatty (change of) liver, not elsewhere classified: Secondary | ICD-10-CM | POA: Diagnosis not present

## 2019-06-19 DIAGNOSIS — F17210 Nicotine dependence, cigarettes, uncomplicated: Secondary | ICD-10-CM | POA: Diagnosis not present

## 2019-06-19 DIAGNOSIS — Z87891 Personal history of nicotine dependence: Secondary | ICD-10-CM

## 2019-06-19 DIAGNOSIS — E785 Hyperlipidemia, unspecified: Secondary | ICD-10-CM | POA: Insufficient documentation

## 2019-06-19 LAB — COMPREHENSIVE METABOLIC PANEL
ALT: 29 U/L (ref 0–44)
AST: 35 U/L (ref 15–41)
Albumin: 4 g/dL (ref 3.5–5.0)
Alkaline Phosphatase: 97 U/L (ref 38–126)
Anion gap: 10 (ref 5–15)
BUN: 13 mg/dL (ref 8–23)
CO2: 26 mmol/L (ref 22–32)
Calcium: 8.8 mg/dL — ABNORMAL LOW (ref 8.9–10.3)
Chloride: 101 mmol/L (ref 98–111)
Creatinine, Ser: 0.74 mg/dL (ref 0.44–1.00)
GFR calc Af Amer: >60 mL/min (ref >60)
GFR calc non Af Amer: >60 mL/min (ref >60)
Glucose, Bld: 137 mg/dL — ABNORMAL HIGH (ref 70–99)
Potassium: 4 mmol/L (ref 3.5–5.1)
Sodium: 137 mmol/L (ref 135–145)
Total Bilirubin: 0.6 mg/dL (ref 0.3–1.2)
Total Protein: 7 g/dL (ref 6.5–8.1)

## 2019-06-19 LAB — CBC WITH DIFFERENTIAL/PLATELET
Abs Immature Granulocytes: 0.05 10*3/uL (ref 0.00–0.07)
Basophils Absolute: 0 10*3/uL (ref 0.0–0.1)
Basophils Relative: 0 %
Eosinophils Absolute: 0 10*3/uL (ref 0.0–0.5)
Eosinophils Relative: 0 %
HCT: 37.5 % (ref 36.0–46.0)
Hemoglobin: 12.3 g/dL (ref 12.0–15.0)
Immature Granulocytes: 1 %
Lymphocytes Relative: 26 %
Lymphs Abs: 2.3 10*3/uL (ref 0.7–4.0)
MCH: 28.7 pg (ref 26.0–34.0)
MCHC: 32.8 g/dL (ref 30.0–36.0)
MCV: 87.4 fL (ref 80.0–100.0)
Monocytes Absolute: 0.8 10*3/uL (ref 0.1–1.0)
Monocytes Relative: 9 %
Neutro Abs: 5.7 10*3/uL (ref 1.7–7.7)
Neutrophils Relative %: 64 %
Platelets: 315 10*3/uL (ref 150–400)
RBC: 4.29 MIL/uL (ref 3.87–5.11)
RDW: 13.2 % (ref 11.5–15.5)
WBC: 8.9 10*3/uL (ref 4.0–10.5)
nRBC: 0 % (ref 0.0–0.2)

## 2019-06-19 LAB — PROTIME-INR
INR: 1 (ref 0.8–1.2)
Prothrombin Time: 13 seconds (ref 11.4–15.2)

## 2019-06-19 LAB — APTT: aPTT: 30 seconds (ref 24–36)

## 2019-06-19 NOTE — Progress Notes (Signed)
  Oncology Nurse Navigator Documentation  Navigator Location: [facility]Med [facility] (06/19/19 1300) Referral Date to RadOnc/MedOnc: 06/18/19 (06/19/19 1300) )Navigator Encounter Type: Clinic/MDC (06/19/19 1300)   Abnormal Finding Date: 06/10/19 (06/19/19 1300)           Multidisiplinary Clinic Date: 06/19/19 (06/19/19 1300) Multidisiplinary Clinic Type: Thoracic (06/19/19 1300)   Patient Visit Type: MedOnc (06/19/19 1300)   Barriers/Navigation Needs: Coordination of Care (06/19/19 1300)   Interventions: Coordination of Care (06/19/19 1300)   Coordination of Care: Appts;Radiology (06/19/19 1300)        Acuity: Level 2-Minimal Needs (1-2 Barriers Identified) (06/19/19 1300)    met with patient during initial consult with Dr. Tasia Catchings to discuss PET scan results and next steps. All questions answered during visit. Reviewed upcoming appts. Contact info given and instructed to call with any further questions or needs. Pt informed that will be notified by phone once biopsy is scheduled. Pt verbalized understanding. Nothing further needed at this time.     Time Spent with Patient: 60 (06/19/19 1300)

## 2019-06-19 NOTE — Progress Notes (Signed)
Patient here to discuss PET scan.

## 2019-06-20 NOTE — Progress Notes (Signed)
[facility]  Hematology/Oncology Follow Up Note [facility] Telephone:([phone] [phone]  Patient Care Team: Cecile Sheerer, MD as PCP - General (Family Medicine) Telford Nab, RN as Oncology Nurse Navigator  REASON FOR VISIT Follow up for abnormal CT and PET scan   HISTORY OF PRESENTING ILLNESS: Elizabeth [name] 69 y.o. female PMH HLD, COPD, vitamin B12 deficiency, depression who is referred by Dr.Shapely-Quinn for evaluation and treatment of iron deficiency anemia. Patient reports that 3 months ago she felt "sick" and she was told that her blood counts were low and later she was informed that her iron levels are low too. She started taking over-the-counter iron supplementation for a few months. Patient's primary care doctor recently checked her CBC and iron test. Lab reports from [facility] showed WBC 9.6 hemoglobin 11.4 platelet 330, MCV 83. Iron 25 TIBC 382, saturation 7% ferritin 9. TSH 1.85 B12 631. She also had stool occult done in May 2018 which were negative. The patient denies any change of bowel habits black stool or blood in the stool. She denies any weight loss fever or night sweats. She feels weak and lack of exercise endurance.  Patient reports that she has a long history of vitamin B12 deficiency. She gets parenteral vitamin shots periodically, managed by PCP and was given by her daughter.  Patient reports that she has had hysterectomy at age of 37. She hasn't had her colonoscopy screening done yet. She denies any bleeding events though she noted easy bruising. She denies any symptoms. He reports that her mother passed away at age of 88 with ovarian cancer and for some unknown reason autopsy was requested at that time and she was told her mom also had breast cancer. Denies any other family cancer history. She is a active smoker, she denies alcohol use. She consume vegetarian diet and brew and drink tea every  day.  She was referred to see GI Dr.Vanga previously and was recommend to have EGD. Patient declined.    # .Her family history revealed both ovarian and breast cancer, per patient no one else in the family has cancer. Patient reports having mammogram done this year April. Discussed with patient the need of genetic counseling and genetic testing. Testing did not reveal any pathogenic mutation in any of these genes. A copy of the genetic test report will be scanned into Epic under the media tab. The genes analyzed were the 47 genes on [facility]'s Common Cancers panel (APC, ATM, AXIN2, BARD1, BMPR1A, BRCA1, BRCA2, BRIP1, CDH1, CDK4, CDKN2A, CHEK2, CTNNA1, DICER1, EPCAM, GREM1, HOXB13, KIT, MEN1, MLH1, MSH2, MSH3, MSH6, MUTYH, NBN, NF1, NTHL1, PALB2, PDGFRA, PMS2, POLD1, POLE, PTEN, RAD50, RAD51C, RAD51D, SDHA, SDHB, SDHC, SDHD, SMAD4, SMARCA4, STK11, TP53, TSC1, TSC2, VHL).    INTERVAL HISTORY Patient with above history reviewed by me today presents for follow-up of abnormal CT and PET scan. Patient was previously seen by me for treatment of iron deficiency, vitamin B12 deficiency. She is current everyday smoker and had lung cancer chest CT screening done on 12/10/2018. There was a new 5.8 mm left lower lobe nodule and short-term follow-up 6 months was recommended 06/10/2019, follow-up CT chest without contrast showed enlarging left lower lobe nodule highly worrisome for primary bronchogenic carcinoma. Also hepatic steatosis and emphysema were noted on prior CT scans. 06/18/2019, PET scan showed 9 mm hypermetabolic left lower lobe pulmonary nodule, highly suspicious for bronchogenic carcinoma.  No evidence of thoracic lymph node or distant metastasis.  Today  she was accompanied by daughter for discussion of the image findings and management plan. She denies cough, shortness of breath, chest pain.  Denies any unintentional weight loss.  Denies headache.  Review of Systems  Constitutional: Negative for  appetite change, chills, fatigue and fever.  HENT:   Negative for hearing loss and voice change.   Eyes: Negative for eye problems.  Respiratory: Negative for chest tightness and cough.   Cardiovascular: Negative for chest pain.  Gastrointestinal: Negative for abdominal distention, abdominal pain and blood in stool.  Endocrine: Negative for hot flashes.  Genitourinary: Negative for difficulty urinating and frequency.   Musculoskeletal: Negative for arthralgias.  Skin: Negative for itching and rash.  Neurological: Negative for extremity weakness.  Hematological: Negative for adenopathy.  Psychiatric/Behavioral: Negative for confusion.    MEDICAL HISTORY: Past Medical History:  Diagnosis Date  . Allergy   . Anxiety   . B12 deficiency   . Carpal tunnel syndrome   . COPD (chronic obstructive pulmonary disease) ([place])   . Depression   . Genetic testing    Common Cancers panel (47 genes) @ [facility] - No pathogenic mutations detected  . Hyperlipidemia   . Hypertension   . Iron deficiency anemia 10/16/2016  . Lipoma of skin   . Normocytic anemia 10/16/2016  . QT prolongation   . Substance abuse ([place])     SURGICAL HISTORY: Past Surgical History:  Procedure Laterality Date  . ABDOMINAL HYSTERECTOMY    . BREAST EXCISIONAL BIOPSY Left   . CESAREAN SECTION    . TUBAL LIGATION      SOCIAL HISTORY: Social History   Socioeconomic History  . Marital status: Divorced    Spouse name: Not on file  . Number of children: Not on file  . Years of education: Not on file  . Highest education level: Not on file  Occupational History  . Not on file  Tobacco Use  . Smoking status: Current Every Day Smoker    Packs/day: 1.00    Years: 52.00    Pack years: 52.00    Types: Cigarettes  . Smokeless tobacco: Never Used  Substance and Sexual Activity  . Alcohol use: No  . Drug use: No  . Sexual activity: Not Currently  Other Topics Concern  . Not on file  Social History Narrative  .  Not on file   Social Determinants of Health   Financial Resource Strain:   . Difficulty of Paying Living Expenses:   Food Insecurity:   . Worried About Charity fundraiser in the Last Year:   . Arboriculturist in the Last Year:   Transportation Needs:   . Film/video editor (Medical):   Marland Kitchen Lack of Transportation (Non-Medical):   Physical Activity:   . Days of Exercise per Week:   . Minutes of Exercise per Session:   Stress:   . Feeling of Stress :   Social Connections:   . Frequency of Communication with Friends and Family:   . Frequency of Social Gatherings with Friends and Family:   . Attends Religious Services:   . Active Member of Clubs or Organizations:   . Attends Archivist Meetings:   Marland Kitchen Marital Status:   Intimate Partner Violence:   . Fear of Current or Ex-Partner:   . Emotionally Abused:   Marland Kitchen Physically Abused:   . Sexually Abused:     FAMILY HISTORY Family History  Problem Relation Age of Onset  . Ovarian cancer Mother 41  deceased 47  . Breast cancer Mother 27  . Stroke Father   . Lung cancer Paternal Aunt   . Lung cancer Cousin     ALLERGIES:  is allergic to moxifloxacin; levofloxacin; tetracycline; and amoxicillin-pot clavulanate.  MEDICATIONS:  Current Outpatient Medications  Medication Sig Dispense Refill  . albuterol (PROAIR HFA) 108 (90 Base) MCG/ACT inhaler Frequency:Q4HPRN   Dosage:2   PUFFS  Instructions:  Note:Dose: 2 PUFFS    . albuterol (PROVENTIL) (2.5 MG/3ML) 0.083% nebulizer solution Inhale into the lungs.    Marland Kitchen albuterol (PROVENTIL) (2.5 MG/3ML) 0.083% nebulizer solution Inhale into the lungs.    Marland Kitchen amLODipine (NORVASC) 5 MG tablet Take 5 mg by mouth daily.    . budesonide-formoterol (SYMBICORT) 160-4.5 MCG/ACT inhaler Inhale into the lungs.    . fluticasone (FLONASE) 50 MCG/ACT nasal spray   1  . furosemide (LASIX) 20 MG tablet Take 20 mg by mouth daily.  0  . Multiple Vitamin (MULTI-VITAMINS) TABS Take by mouth.    Marland Kitchen  omeprazole (PRILOSEC OTC) 20 MG tablet Take by mouth.    . potassium chloride (KLOR-CON) 20 MEQ packet Take 20 mEq by mouth daily.    . simvastatin (ZOCOR) 80 MG tablet Take by mouth.    . tiotropium (SPIRIVA HANDIHALER) 18 MCG inhalation capsule inhale contents of 1 capsule by mouth once daily    . venlafaxine XR (EFFEXOR-XR) 150 MG 24 hr capsule TK 1 C PO ONCE D  2   No current facility-administered medications for this visit.    PHYSICAL EXAMINATION:  ECOG PERFORMANCE STATUS: 1 - Symptomatic but completely ambulatory  Vitals:   06/19/19 0905  BP: 131/83  Pulse: 92  Resp: 20  Temp: (!) 97 F (36.1 C)  SpO2: 95%   Filed Weights   06/19/19 0905  Weight: 190 lb 9.6 oz (86.5 kg)     Physical Exam  Constitutional: She is oriented to person, place, and time. No distress.  HENT:  Head: Normocephalic and atraumatic.  Nose: Nose normal.  Mouth/Throat: Oropharynx is clear and moist. No oropharyngeal exudate.  Eyes: Pupils are equal, round, and reactive to light. EOM are normal. No scleral icterus.  Cardiovascular: Normal rate and regular rhythm.  No murmur heard. Pulmonary/Chest: Effort normal. No respiratory distress. She has no rales. She exhibits no tenderness.  Abdominal: Soft. She exhibits no distension. There is no abdominal tenderness.  Musculoskeletal:        General: No edema. Normal range of motion.     Cervical back: Normal range of motion and neck supple.  Neurological: She is alert and oriented to person, place, and time. No cranial nerve deficit. She exhibits normal muscle tone. Coordination normal.  Skin: Skin is warm and dry. She is not diaphoretic. No erythema.  Psychiatric: Affect normal.      LABORATORY DATA: I have personally reviewed the data as listed:   Appointment on 06/19/2019  Component Date Value Ref Range Status  . aPTT 06/19/2019 30  24 - 36 seconds Final   Performed at [facility], [address] [place], [place] [address]  .  Prothrombin Time 06/19/2019 13.0  11.4 - 15.2 seconds Final  . INR 06/19/2019 1.0  0.8 - 1.2 Final   Comment: (NOTE) INR goal varies based on device and disease states. Performed at [facility], [address] [place], [place] [address]   . Sodium 06/19/2019 137  135 - 145 mmol/L Final  . Potassium 06/19/2019 4.0  3.5 - 5.1 mmol/L Final  .  Chloride 06/19/2019 101  98 - 111 mmol/L Final  . CO2 06/19/2019 26  22 - 32 mmol/L Final  . Glucose, Bld 06/19/2019 137* 70 - 99 mg/dL Final   Glucose reference range applies only to samples taken after fasting for at least 8 hours.  . BUN 06/19/2019 13  8 - 23 mg/dL Final  . Creatinine, Ser 06/19/2019 0.74  0.44 - 1.00 mg/dL Final  . Calcium 06/19/2019 8.8* 8.9 - 10.3 mg/dL Final  . Total Protein 06/19/2019 7.0  6.5 - 8.1 g/dL Final  . Albumin 06/19/2019 4.0  3.5 - 5.0 g/dL Final  . AST 06/19/2019 35  15 - 41 U/L Final  . ALT 06/19/2019 29  0 - 44 U/L Final  . Alkaline Phosphatase 06/19/2019 97  38 - 126 U/L Final  . Total Bilirubin 06/19/2019 0.6  0.3 - 1.2 mg/dL Final  . GFR calc non Af Amer 06/19/2019 >60  >60 mL/min Final  . GFR calc Af Amer 06/19/2019 >60  >60 mL/min Final  . Anion gap 06/19/2019 10  5 - 15 Final   Performed at [facility], [address], [place], [place] [address]  . WBC 06/19/2019 8.9  4.0 - 10.5 K/uL Final  . RBC 06/19/2019 4.29  3.87 - 5.11 MIL/uL Final  . Hemoglobin 06/19/2019 12.3  12.0 - 15.0 g/dL Final  . HCT 06/19/2019 37.5  36.0 - 46.0 % Final  . MCV 06/19/2019 87.4  80.0 - 100.0 fL Final  . MCH 06/19/2019 28.7  26.0 - 34.0 pg Final  . MCHC 06/19/2019 32.8  30.0 - 36.0 g/dL Final  . RDW 06/19/2019 13.2  11.5 - 15.5 % Final  . Platelets 06/19/2019 315  150 - 400 K/uL Final  . nRBC 06/19/2019 0.0  0.0 - 0.2 % Final  . Neutrophils Relative % 06/19/2019 64  % Final  . Neutro Abs 06/19/2019 5.7  1.7 - 7.7 K/uL Final  . Lymphocytes Relative 06/19/2019 26  % Final  . Lymphs Abs 06/19/2019 2.3   0.7 - 4.0 K/uL Final  . Monocytes Relative 06/19/2019 9  % Final  . Monocytes Absolute 06/19/2019 0.8  0.1 - 1.0 K/uL Final  . Eosinophils Relative 06/19/2019 0  % Final  . Eosinophils Absolute 06/19/2019 0.0  0.0 - 0.5 K/uL Final  . Basophils Relative 06/19/2019 0  % Final  . Basophils Absolute 06/19/2019 0.0  0.0 - 0.1 K/uL Final  . Immature Granulocytes 06/19/2019 1  % Final  . Abs Immature Granulocytes 06/19/2019 0.05  0.00 - 0.07 K/uL Final   Performed at [facility], [address] [place],  [address]  Hospital Outpatient Visit on 06/18/2019  Component Date Value Ref Range Status  . Glucose-Capillary 06/18/2019 117* 70 - 99 mg/dL Final   Glucose reference range applies only to samples taken after fasting for at least 8 hours.    ASSESSMENT/PLAN 1. Lung nodule   2. Personal history of tobacco use, presenting hazards to health   3. Pernicious anemia    #Lung nodule, CT images and PET scan images were independently reviewed by me and discussed with patient and daughter.  Concerning for primary lung cancer I recommend CT-guided lung nodule biopsy to establish tissue diagnosis.  Potential side effects of CT-guided lung biopsy was discussed with patient.  She agrees to proceed. No FDG avid thoracic lymphadenopathy.  Likely early stage lung cancer. Patient's daughter expresses interest in consultation with thoracic surgery for possible resection if biopsy result is positive  for lung cancer.  I will refer to Dr. Genevive Bi. #Emphysema, she follows up with Dr. Raul Del. #Tobacco use, smoke cessation was discussed with patient. #Pernicious anemia, history of B12 deficiency, previously declined endoscopy and GI work-up.  Continue receiving parenteral B12 injections via PCP.  Check CBC, CMP, coags Return of visit 1 week after biopsy.   Earlie Server, MD, PhD Hematology Oncology [facility] at [facility] Pager- [phone] 06/20/2019

## 2019-06-22 ENCOUNTER — Other Ambulatory Visit: Payer: Self-pay | Admitting: Oncology

## 2019-06-23 ENCOUNTER — Other Ambulatory Visit: Payer: Self-pay | Admitting: *Deleted

## 2019-06-23 NOTE — Addendum Note (Signed)
Addended by: Telford Nab on: 06/23/2019 08:47 AM   Modules accepted: Orders

## 2019-06-24 ENCOUNTER — Other Ambulatory Visit
Admission: RE | Admit: 2019-06-24 | Discharge: 2019-06-24 | Disposition: A | Discharge status: Home / Self Care | Payer: Medicare PPO | Source: Ambulatory Visit | Attending: Oncology | Admitting: Oncology

## 2019-06-24 ENCOUNTER — Other Ambulatory Visit: Payer: Self-pay

## 2019-06-24 DIAGNOSIS — J984 Other disorders of lung: Secondary | ICD-10-CM | POA: Insufficient documentation

## 2019-06-24 DIAGNOSIS — R911 Solitary pulmonary nodule: Secondary | ICD-10-CM | POA: Insufficient documentation

## 2019-06-24 DIAGNOSIS — Z01818 Encounter for other preprocedural examination: Secondary | ICD-10-CM | POA: Insufficient documentation

## 2019-06-24 DIAGNOSIS — Z20822 Contact with and (suspected) exposure to covid-19: Secondary | ICD-10-CM | POA: Diagnosis not present

## 2019-06-24 LAB — SARS CORONAVIRUS 2 (TAT 6-24 HRS): SARS Coronavirus 2: NEGATIVE

## 2019-06-25 ENCOUNTER — Telehealth: Payer: Self-pay

## 2019-06-25 ENCOUNTER — Other Ambulatory Visit: Payer: Medicare PPO

## 2019-06-25 ENCOUNTER — Ambulatory Visit (HOSPITAL_COMMUNITY): Payer: Medicare PPO

## 2019-06-25 DIAGNOSIS — Z01818 Encounter for other preprocedural examination: Secondary | ICD-10-CM | POA: Diagnosis not present

## 2019-06-25 DIAGNOSIS — R911 Solitary pulmonary nodule: Secondary | ICD-10-CM | POA: Diagnosis not present

## 2019-06-25 LAB — BLOOD GAS, ARTERIAL
Acid-Base Excess: 7.4 mmol/L — ABNORMAL HIGH (ref 0.0–2.0)
Bicarbonate: 32 mmol/L — ABNORMAL HIGH (ref 20.0–28.0)
FIO2: 0.21
O2 Saturation: 96.1 %
Patient temperature: 37
pCO2 arterial: 44 mmHg (ref 32.0–48.0)
pH, Arterial: 7.47 — ABNORMAL HIGH (ref 7.350–7.450)
pO2, Arterial: 77 mmHg — ABNORMAL LOW (ref 83.0–108.0)

## 2019-06-25 MED ORDER — ALBUTEROL SULFATE (2.5 MG/3ML) 0.083% IN NEBU
2.5000 mg | INHALATION_SOLUTION | Freq: Once | RESPIRATORY_TRACT | Status: AC
  Administered 2019-06-25: 2.5 mg via RESPIRATORY_TRACT
  Filled 2019-06-25: qty 3

## 2019-06-25 NOTE — Telephone Encounter (Signed)
Per [facility] RN : Radiology did not want to pursue biopsy since it would be too risky. Pt has been referred to Dr. Genevive Bi for surgical evaluation. Hopefully it can be taken out with surgery so no biopsy is needed at this time.  Will follow up to set up MD visit.

## 2019-06-25 NOTE — Progress Notes (Signed)
Tumor Board Documentation  Elizabeth [name] was presented by Dr Tasia Catchings at our Tumor Board on 06/25/2019, which included representatives from medical oncology, radiation oncology, navigation, pathology, radiology, surgical, surgical oncology, internal medicine, genetics, palliative care, research.  Elizabeth [name] currently presents as a new patient, for discussion with history of the following treatments: active survellience.  Additionally, we reviewed previous medical and familial history, history of present illness, and recent lab results along with all available histopathologic and imaging studies. The tumor board considered available treatment options and made the following recommendations: Surgery Refer to Dr Genevive Bi for surgical evaluation  The following procedures/referrals were also placed: No orders of the defined types were placed in this encounter.   Clinical Trial Status: not discussed   Staging used: Not Applicable  National site-specific guidelines   were discussed with respect to the case.  Tumor board is a meeting of clinicians from various specialty areas who evaluate and discuss patients for whom a multidisciplinary approach is being considered. Final determinations in the plan of care are those of the provider(s). The responsibility for follow up of recommendations given during tumor board is that of the provider.   Today's extended care, comprehensive team conference, Elizabeth [name] was not present for the discussion and was not examined.   Multidisciplinary Tumor Board is a multidisciplinary case peer review process.  Decisions discussed in the Multidisciplinary Tumor Board reflect the opinions of the specialists present at the conference without having examined the patient.  Ultimately, treatment and diagnostic decisions rest with the primary provider(s) and the patient.

## 2019-06-26 ENCOUNTER — Encounter: Payer: Self-pay | Admitting: Cardiothoracic Surgery

## 2019-06-26 ENCOUNTER — Other Ambulatory Visit: Payer: Self-pay

## 2019-06-26 ENCOUNTER — Ambulatory Visit: Payer: Medicare PPO | Admitting: Cardiothoracic Surgery

## 2019-06-26 VITALS — BP 154/89 | HR 94 | Temp 97.2°F | Ht 62.0 in | Wt 186.6 lb

## 2019-06-26 DIAGNOSIS — C3490 Malignant neoplasm of unspecified part of unspecified bronchus or lung: Secondary | ICD-10-CM | POA: Diagnosis not present

## 2019-06-26 DIAGNOSIS — G4730 Sleep apnea, unspecified: Secondary | ICD-10-CM | POA: Insufficient documentation

## 2019-06-26 DIAGNOSIS — G4733 Obstructive sleep apnea (adult) (pediatric): Secondary | ICD-10-CM | POA: Insufficient documentation

## 2019-06-26 NOTE — Patient Instructions (Addendum)
Dr.Oaks discussed with patient the two different treatment options with patient at today's visit such as Surgery or Radiation Therapy.   Dr.Oaks recommends patient to follow up with Dr.Chrystal (Radiation Therapist) at the [facility] and to see what he recommends for patient. Patient will follow up with our office to let us know what her next recommended steps will be.

## 2019-06-26 NOTE — Progress Notes (Signed)
Patient ID: Elizabeth [name], female   DOB: 1951-01-05, 69 y.o.   MRN: [id number]  Chief Complaint  Patient presents with  . New Patient (Initial Visit)    new pt ref Dr.Yu lung nodule    Referred By Dr. Tasia Catchings Reason for Referral left lower lobe mass  HPI Location, Quality, Duration, Severity, Timing, Context, Modifying Factors, Associated Signs and Symptoms.  Elizabeth [name] is a 69 y.o. female.  This patient is a 69 year old woman who has undergone several CT scans as part of the lung screening program.  She had a CT scan in 2019 that did not reveal any evidence of lung nodules.  In September 2020 she had a repeat CT scan which revealed a small left lower lobe nodule which on 25-month follow-up had increased in size from 4 mm to about 9 mm the patient then had a PET scan and the PET scan showed uptake in the nodule but no other evidence of disease.  It was felt that this was most likely a bronchogenic carcinoma.  She then had a set of pulmonary function studies which revealed an FEV1 of 40% but a DLCO of 100%.  She has been on oxygen therapy at night.  She states she follows up with someone at [facility] for her COPD and she has been wearing her oxygen every evening.  She does not wear it during the day.  She does not check her oxygen levels.  She states that she can get short of breath even walking from the bedroom to her kitchen.  She does continue to smoke a half a pack to 1 pack cigarettes per day.  She states that she has been smoking since her early teenage years.  She denies any fevers or chills.  She states she has gained a little bit of weight.  She denied any nausea or vomiting.  She denied any hemoptysis.   Past Medical History:  Diagnosis Date  . Allergy   . Anxiety   . B12 deficiency   . Carpal tunnel syndrome   . COPD (chronic obstructive pulmonary disease) ([place])   . Depression   . Genetic testing    Common Cancers panel (47 genes) @ [facility] - No pathogenic mutations detected  .  Hyperlipidemia   . Hypertension   . Iron deficiency anemia 10/16/2016  . Lipoma of skin   . Normocytic anemia 10/16/2016  . QT prolongation   . Substance abuse [facility])     Past Surgical History:  Procedure Laterality Date  . ABDOMINAL HYSTERECTOMY    . BREAST EXCISIONAL BIOPSY Left   . CESAREAN SECTION    . TUBAL LIGATION      Family History  Problem Relation Age of Onset  . Ovarian cancer Mother 38       deceased 35  . Breast cancer Mother 57  . Stroke Father   . Lung cancer Paternal Aunt   . Lung cancer Cousin     Social History Social History   Tobacco Use  . Smoking status: Current Every Day Smoker    Packs/day: 1.00    Years: 52.00    Pack years: 52.00    Types: Cigarettes  . Smokeless tobacco: Never Used  Substance Use Topics  . Alcohol use: No  . Drug use: No    Allergies  Allergen Reactions  . Moxifloxacin Itching, Other (See Comments), Photosensitivity, Rash and Swelling    Causes swelling, redness, burning in eyes   . Levofloxacin Other (See Comments)  Other reaction(s): Muscle Pain Other reaction(s): Muscle Pain  . Tetracycline Other (See Comments)  . Amoxicillin-Pot Clavulanate Nausea Only and Other (See Comments)    Other Reaction: DIARRHEA. Other reaction(s): Other (See Comments) Other Reaction: DIARRHEA. Other Reaction: DIARRHEA.  Other reaction(s): Other (See Comments) Other Reaction: DIARRHEA.    Current Outpatient Medications  Medication Sig Dispense Refill  . albuterol (PROAIR HFA) 108 (90 Base) MCG/ACT inhaler Frequency:Q4HPRN   Dosage:2   PUFFS  Instructions:  Note:Dose: 2 PUFFS    . albuterol (PROVENTIL) (2.5 MG/3ML) 0.083% nebulizer solution Inhale into the lungs.    Marland Kitchen albuterol (PROVENTIL) (2.5 MG/3ML) 0.083% nebulizer solution Inhale into the lungs.    Marland Kitchen amLODipine (NORVASC) 5 MG tablet Take 5 mg by mouth daily.    Marland Kitchen amoxicillin (AMOXIL) 875 MG tablet Take by mouth.    . budesonide-formoterol (SYMBICORT) 160-4.5 MCG/ACT  inhaler Inhale into the lungs.    . cyanocobalamin (,VITAMIN B-12,) 1000 MCG/ML injection     . fluticasone (FLONASE) 50 MCG/ACT nasal spray   1  . furosemide (LASIX) 20 MG tablet Take 20 mg by mouth daily.  0  . Multiple Vitamin (MULTI-VITAMINS) TABS Take by mouth.    Marland Kitchen omeprazole (PRILOSEC OTC) 20 MG tablet Take by mouth.    . potassium chloride (KLOR-CON) 20 MEQ packet Take 20 mEq by mouth daily.    . simvastatin (ZOCOR) 80 MG tablet Take by mouth.    . tiotropium (SPIRIVA HANDIHALER) 18 MCG inhalation capsule inhale contents of 1 capsule by mouth once daily    . venlafaxine XR (EFFEXOR-XR) 150 MG 24 hr capsule TK 1 C PO ONCE D  2   No current facility-administered medications for this visit.      Review of Systems A complete review of systems was asked and was negative except for the following positive findings weight gain, night sweats, chills, fatigue, bronchitis, shortness of breath, arthritis.  Blood pressure (!) 154/89, pulse 94, temperature (!) 97.2 F (36.2 C), temperature source Temporal, height 5\' 2"  (1.575 m), weight 186 lb 9.6 oz (84.6 kg), SpO2 94 %.  Physical Exam CONSTITUTIONAL:  Pleasant, well-developed, well-nourished, and in no acute distress. EYES: Pupils equal and reactive to light, Sclera non-icteric EARS, NOSE, MOUTH AND THROAT:  The oropharynx was clear.  Dentition is absent repair.  Oral mucosa pink and moist. LYMPH NODES:  Lymph nodes in the neck and axillae were normal RESPIRATORY:  Lungs were clear.  Normal respiratory effort without pathologic use of accessory muscles of respiration CARDIOVASCULAR: Heart was regular without murmurs.  There were no carotid bruits. GI: The abdomen was soft, nontender, and nondistended. There were no palpable masses. There was no hepatosplenomegaly. There were normal bowel sounds in all quadrants. GU:  Rectal deferred.   MUSCULOSKELETAL:  Normal muscle strength and tone.  No clubbing or cyanosis.   SKIN:  There were no  pathologic skin lesions.  There were no nodules on palpation.  There is a large lipoma overlying her left scapula NEUROLOGIC:  Sensation is normal.  Cranial nerves are grossly intact. PSYCH:  Oriented to person, place and time.  Mood and affect are normal.  Data Reviewed Multiple CT scans and PET scans  I have personally reviewed the patient's imaging, laboratory findings and medical records.    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  We discussed the role of surgery and radiation therapy.  I also explained to her that my current physical limitations would preclude me from performing the surgery here at [facility].  She is not interested in pursuing any surgical options but would like to meet with Dr. Berton Mount and radiation therapy to discuss the possibility of SBRT for her lung mass.  We will go ahead and set that up.  I told her that I had be happy to see her after that evaluation.  I can do this virtually if she would like to review again the options.  She understands.  I gave her my business card.  All of her questions were answered.       Nestor Lewandowsky, MD 06/26/2019, 11:33 AM

## 2019-06-30 ENCOUNTER — Encounter: Payer: Self-pay | Admitting: Oncology

## 2019-06-30 DIAGNOSIS — Z7189 Other specified counseling: Secondary | ICD-10-CM | POA: Insufficient documentation

## 2019-07-08 ENCOUNTER — Other Ambulatory Visit: Payer: Self-pay

## 2019-07-08 ENCOUNTER — Encounter: Payer: Self-pay | Admitting: Radiation Oncology

## 2019-07-08 ENCOUNTER — Ambulatory Visit
Admission: RE | Admit: 2019-07-08 | Discharge: 2019-07-08 | Disposition: A | Discharge status: Home / Self Care | Payer: Medicare PPO | Source: Ambulatory Visit | Attending: Radiation Oncology | Admitting: Radiation Oncology

## 2019-07-08 ENCOUNTER — Encounter: Payer: Self-pay | Admitting: *Deleted

## 2019-07-08 VITALS — BP 151/77 | HR 93 | Temp 96.6°F | Resp 18 | Wt 183.1 lb

## 2019-07-08 DIAGNOSIS — D649 Anemia, unspecified: Secondary | ICD-10-CM | POA: Insufficient documentation

## 2019-07-08 DIAGNOSIS — E538 Deficiency of other specified B group vitamins: Secondary | ICD-10-CM | POA: Insufficient documentation

## 2019-07-08 DIAGNOSIS — Z803 Family history of malignant neoplasm of breast: Secondary | ICD-10-CM | POA: Diagnosis not present

## 2019-07-08 DIAGNOSIS — Z79899 Other long term (current) drug therapy: Secondary | ICD-10-CM | POA: Insufficient documentation

## 2019-07-08 DIAGNOSIS — I10 Essential (primary) hypertension: Secondary | ICD-10-CM | POA: Diagnosis not present

## 2019-07-08 DIAGNOSIS — F17210 Nicotine dependence, cigarettes, uncomplicated: Secondary | ICD-10-CM | POA: Diagnosis not present

## 2019-07-08 DIAGNOSIS — E785 Hyperlipidemia, unspecified: Secondary | ICD-10-CM | POA: Insufficient documentation

## 2019-07-08 DIAGNOSIS — J449 Chronic obstructive pulmonary disease, unspecified: Secondary | ICD-10-CM | POA: Diagnosis not present

## 2019-07-08 DIAGNOSIS — R918 Other nonspecific abnormal finding of lung field: Secondary | ICD-10-CM | POA: Diagnosis not present

## 2019-07-08 DIAGNOSIS — F419 Anxiety disorder, unspecified: Secondary | ICD-10-CM | POA: Diagnosis not present

## 2019-07-08 DIAGNOSIS — R911 Solitary pulmonary nodule: Secondary | ICD-10-CM

## 2019-07-08 NOTE — Consult Note (Signed)
NEW PATIENT EVALUATION  Name: Elizabeth [name]  MRN: [id number]  Date:   07/08/2019     DOB: 1950-09-16   This 69 y.o. 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  REFERRING PHYSICIAN: Shapely-Quinn, Elsie Stain*  CHIEF COMPLAINT:  Chief Complaint  Patient presents with  . Lung Cancer    Initial consultation    DIAGNOSIS: The encounter diagnosis was Lung nodule.   PREVIOUS INVESTIGATIONS:  CT scans and PET CT scan reviewed Clinical notes reviewed  HPI: Patient is a 69 year old female longtime smoker who had a screening CT scan done in 2019 showing no evidence of disease.  In September 2020 she had a scan which showed a small left lower lobe nodule which over 52-month interval increase from 4 mm to 9 mm.  PET scan showed hypermetabolic activity in the left lower lobe nodule with no other evidence of disease no evidence of mediastinal adenopathy or hilar adenopathy.  She was referred for CT-guided biopsy although declined by radiology based on low yield and high risk for pneumothorax in this patient with compromised lung function.  She has an FEV1 of 40% and she is on nasal oxygen at night.  She has over 75 pack smoking history.  She has a minimal nonproductive cough no hemoptysis or chest tightness she is having no bone pain.  She has been seen by thoracic surgery and based on her pulmonary functions as well as patient's wishes she is now referred to radiation oncology for opinion.  PLANNED TREATMENT REGIMEN: SBRT  PAST MEDICAL HISTORY:  has a past medical history of Allergy, Anxiety, B12 deficiency, Carpal tunnel syndrome, COPD (chronic obstructive pulmonary disease) ([place]), Depression, Genetic testing, Hyperlipidemia, Hypertension, Iron deficiency anemia (10/16/2016), Lipoma of skin, Normocytic anemia (10/16/2016), QT prolongation, and Substance abuse ([place]).    PAST SURGICAL HISTORY:  Past Surgical History:   Procedure Laterality Date  . ABDOMINAL HYSTERECTOMY    . BREAST EXCISIONAL BIOPSY Left   . CESAREAN SECTION    . TUBAL LIGATION      FAMILY HISTORY: family history includes Breast cancer (age of onset: 61) in her mother; Lung cancer in her cousin and paternal aunt; Ovarian cancer (age of onset: 33) in her mother; Stroke in her father.  SOCIAL HISTORY:  reports that she has been smoking cigarettes. She has a 52.00 pack-year smoking history. She has never used smokeless tobacco. She reports that she does not drink alcohol or use drugs.  ALLERGIES: Moxifloxacin, Levofloxacin, Tetracycline, and Amoxicillin-pot clavulanate  MEDICATIONS:  Current Outpatient Medications  Medication Sig Dispense Refill  . albuterol (PROAIR HFA) 108 (90 Base) MCG/ACT inhaler Frequency:Q4HPRN   Dosage:2   PUFFS  Instructions:  Note:Dose: 2 PUFFS    . albuterol (PROVENTIL) (2.5 MG/3ML) 0.083% nebulizer solution Inhale into the lungs.    Marland Kitchen albuterol (PROVENTIL) (2.5 MG/3ML) 0.083% nebulizer solution Inhale into the lungs.    Marland Kitchen amLODipine (NORVASC) 5 MG tablet Take 5 mg by mouth daily.    . budesonide-formoterol (SYMBICORT) 160-4.5 MCG/ACT inhaler Inhale into the lungs.    . cyanocobalamin (,VITAMIN B-12,) 1000 MCG/ML injection     . fluticasone (FLONASE) 50 MCG/ACT nasal spray   1  . furosemide (LASIX) 20 MG tablet Take 20 mg by mouth daily.  0  . Multiple Vitamin (MULTI-VITAMINS) TABS Take by mouth.    Marland Kitchen omeprazole (PRILOSEC OTC) 20 MG tablet Take by mouth.    . potassium chloride (KLOR-CON)  20 MEQ packet Take 20 mEq by mouth daily.    . simvastatin (ZOCOR) 80 MG tablet Take by mouth.    . tiotropium (SPIRIVA HANDIHALER) 18 MCG inhalation capsule inhale contents of 1 capsule by mouth once daily    . venlafaxine XR (EFFEXOR-XR) 150 MG 24 hr capsule TK 1 C PO ONCE D  2   No current facility-administered medications for this encounter.    ECOG PERFORMANCE STATUS:  0 - Asymptomatic  REVIEW OF SYSTEMS: Patient  does have's known COPD Patient denies any weight loss, fatigue, weakness, fever, chills or night sweats. Patient denies any loss of vision, blurred vision. Patient denies any ringing  of the ears or hearing loss. No irregular heartbeat. Patient denies heart murmur or history of fainting. Patient denies any chest pain or pain radiating to her upper extremities. Patient denies any shortness of breath, difficulty breathing at night, cough or hemoptysis. Patient denies any swelling in the lower legs. Patient denies any nausea vomiting, vomiting of blood, or coffee ground material in the vomitus. Patient denies any stomach pain. Patient states has had normal bowel movements no significant constipation or diarrhea. Patient denies any dysuria, hematuria or significant nocturia. Patient denies any problems walking, swelling in the joints or loss of balance. Patient denies any skin changes, loss of hair or loss of weight. Patient denies any excessive worrying or anxiety or significant depression. Patient denies any problems with insomnia. Patient denies excessive thirst, polyuria, polydipsia. Patient denies any swollen glands, patient denies easy bruising or easy bleeding. Patient denies any recent infections, allergies or URI. Patient "s visual fields have not changed significantly in recent time.   PHYSICAL EXAM: BP (!) 151/77 (BP Location: Left Arm, Patient Position: Sitting)   Pulse 93   Temp (!) 96.6 F (35.9 C) (Tympanic)   Resp 18   Wt 183 lb 1.6 oz (83.1 kg)   BMI 33.49 kg/m  Well-developed well-nourished patient in NAD. HEENT reveals PERLA, EOMI, discs not visualized.  Oral cavity is clear. No oral mucosal lesions are identified. Neck is clear without evidence of cervical or supraclavicular adenopathy. Lungs are clear to A&P. Cardiac examination is essentially unremarkable with regular rate and rhythm without murmur rub or thrill. Abdomen is benign with no organomegaly or masses noted. Motor sensory and  DTR levels are equal and symmetric in the upper and lower extremities. Cranial nerves II through XII are grossly intact. Proprioception is intact. No peripheral adenopathy or edema is identified. No motor or sensory levels are noted. Crude visual fields are within normal range.  LABORATORY DATA: Labs reviewed    RADIOLOGY RESULTS: CT scan and PET CT scans reviewed compatible with above-stated findings   IMPRESSION: Stage I non-small cell lung cancer left lower lobe in 69 year old female  PLAN: This time I have recommended SBRT 6000 cGy in 5 fractions to her left lower lobe nodule.  Patient has declined surgical intervention.  Risks and benefits of treatment including development of cough possible fatigue skin reaction all were described in detail to the patient.  She seems to comprehend my treatment plan well and has excepted treatment.  I have personally set up and ordered CT simulation.  We will use motion restriction 4-dimensional treatment planning during simulation.  Patient and her daughter both comprehend my treatment plan well.  I would like to take this opportunity to thank you for allowing me to participate in the care of your patient.[name], MD

## 2019-07-08 NOTE — Progress Notes (Signed)
  Oncology Nurse Navigator Documentation  Navigator Location: [facility] (07/08/19 1400)   )Navigator Encounter Type: Initial RadOnc (07/08/19 1400)                       Treatment Phase: Pre-Tx/Tx Discussion (07/08/19 1400) Barriers/Navigation Needs: Coordination of Care (07/08/19 1400)   Interventions: Coordination of Care (07/08/19 1400)   Coordination of Care: Appts (07/08/19 1400)         met with patient during initial rad-onc consultation with Dr. Baruch Gouty. All questions answered during visit. Reviewed upcoming appts. Pt stated that she needs help finding local pulmonologist and primary care within [place]. Informed pt that will bring her more information on local providers at her next visit. Instructed to call with any further questions or needs. Pt verbalized understanding.          Time Spent with Patient: 60 (07/08/19 1400)

## 2019-07-09 ENCOUNTER — Telehealth: Payer: Self-pay

## 2019-07-09 ENCOUNTER — Telehealth: Payer: Self-pay | Admitting: *Deleted

## 2019-07-09 ENCOUNTER — Other Ambulatory Visit: Payer: Self-pay

## 2019-07-09 DIAGNOSIS — R911 Solitary pulmonary nodule: Secondary | ICD-10-CM

## 2019-07-09 NOTE — Telephone Encounter (Signed)
Cancel appts on 5/25 please.

## 2019-07-09 NOTE — Telephone Encounter (Signed)
Pt is already scheduled to RTC on 08/25/19 for lab/MD 1 yr f/u per 08/26/18 los ok to cx that appt?

## 2019-07-09 NOTE — Telephone Encounter (Signed)
-----   Message from [place], [place] sent at 07/08/2019  2:06 PM EDT ----- Regarding: Transfer care Good afternoon!   This patient will start radiation treatments soon for a lung nodule and she asked me today if she could get scheduled with a pulmonologist locally that is apart of the [facility] network. She has been seeing pulmonary at [facility] recently but would like to transfer her care to [facility]. Is there any way she could get scheduled for an appt?  She does not need to be seen urgently, just within the next month or so probably.   [facility]

## 2019-07-09 NOTE — Telephone Encounter (Signed)
Done.. Pt has been sched to RTC in 81mths for lab/MD f/u as requested per MD Pt is aware of her October 2021 appts

## 2019-07-09 NOTE — Telephone Encounter (Signed)
Patient's plan for RTC: lab/MD in 6 months (cbc,cmp,b12). Please schedule & notify pt of appts. thanks

## 2019-07-10 ENCOUNTER — Telehealth: Payer: Self-pay | Admitting: *Deleted

## 2019-07-10 NOTE — Telephone Encounter (Signed)
Patient was called to follow up after her consultation with Dr. Baruch Gouty.   She did not have any questions and denied need for further education at this time.

## 2019-07-15 ENCOUNTER — Ambulatory Visit
Admission: RE | Admit: 2019-07-15 | Discharge: 2019-07-15 | Disposition: A | Discharge status: Home / Self Care | Payer: Medicare PPO | Source: Ambulatory Visit | Attending: Radiation Oncology | Admitting: Radiation Oncology

## 2019-07-15 DIAGNOSIS — C3432 Malignant neoplasm of lower lobe, left bronchus or lung: Secondary | ICD-10-CM | POA: Diagnosis present

## 2019-07-15 DIAGNOSIS — Z510 Encounter for antineoplastic radiation therapy: Secondary | ICD-10-CM | POA: Diagnosis present

## 2019-07-21 DIAGNOSIS — Z510 Encounter for antineoplastic radiation therapy: Secondary | ICD-10-CM | POA: Diagnosis not present

## 2019-07-27 ENCOUNTER — Encounter: Payer: Self-pay | Admitting: *Deleted

## 2019-07-27 ENCOUNTER — Ambulatory Visit
Admission: RE | Admit: 2019-07-27 | Discharge: 2019-07-27 | Disposition: A | Discharge status: Home / Self Care | Payer: Medicare PPO | Source: Ambulatory Visit | Attending: Radiation Oncology | Admitting: Radiation Oncology

## 2019-07-27 DIAGNOSIS — Z510 Encounter for antineoplastic radiation therapy: Secondary | ICD-10-CM | POA: Diagnosis not present

## 2019-07-27 NOTE — Progress Notes (Signed)
  Oncology Nurse Navigator Documentation  Navigator Location: [facility] (07/27/19 1300)   )Navigator Encounter Type: Appt/Treatment Plan Review (07/27/19 1300)                   Treatment Initiated Date: 07/27/19 (07/27/19 1300)   Treatment Phase: First Radiation Tx (07/27/19 1300) Barriers/Navigation Needs: No Barriers At This Time (07/27/19 1300)   Interventions: None Required (07/27/19 1300)                      Time Spent with Patient: 15 (07/27/19 1300)

## 2019-07-28 ENCOUNTER — Other Ambulatory Visit: Payer: Self-pay | Admitting: *Deleted

## 2019-07-28 DIAGNOSIS — R911 Solitary pulmonary nodule: Secondary | ICD-10-CM

## 2019-07-29 ENCOUNTER — Encounter: Payer: Self-pay | Admitting: *Deleted

## 2019-07-29 ENCOUNTER — Ambulatory Visit
Admission: RE | Admit: 2019-07-29 | Discharge: 2019-07-29 | Disposition: A | Discharge status: Home / Self Care | Payer: Medicare PPO | Source: Ambulatory Visit | Attending: Radiation Oncology | Admitting: Radiation Oncology

## 2019-07-29 DIAGNOSIS — Z510 Encounter for antineoplastic radiation therapy: Secondary | ICD-10-CM | POA: Diagnosis not present

## 2019-07-29 NOTE — Progress Notes (Signed)
  Oncology Nurse Navigator Documentation  Navigator Location: [facility] (07/29/19 1400)   )Navigator Encounter Type: Treatment (07/29/19 1400)                     Patient Visit Type: ZOXWRU (07/29/19 1400) Treatment Phase: Treatment (07/29/19 1400) Barriers/Navigation Needs: No Barriers At This Time (07/29/19 1400)   Interventions: None Required (07/29/19 1400)                      Time Spent with Patient: 15 (07/29/19 1400)

## 2019-07-31 ENCOUNTER — Telehealth: Payer: Self-pay | Admitting: *Deleted

## 2019-07-31 DIAGNOSIS — D649 Anemia, unspecified: Secondary | ICD-10-CM

## 2019-07-31 NOTE — Telephone Encounter (Signed)
-----   Message from Earlie Server, MD sent at 07/30/2019  3:40 PM EDT ----- Regarding: RE: Labs She had blood work done in March 2021, no anemia. If she wants, ok to repeat cbc cmp , and also B12 in July.  ----- Message ----- From: Telford Nab, RN Sent: 07/29/2019   2:53 PM EDT To: Earlie Server, MD Subject: Labs                                           I gave pt her appts for follow up CT and MD visit. She was asking when her labs will be checked again to follow up her anemia. Her last set of labs were checked May 2020 and pt didn't know if she needed to wait until Oct to check them again or if they needed to be checked after she has her CT in July?  [facility]

## 2019-08-03 ENCOUNTER — Ambulatory Visit
Admission: RE | Admit: 2019-08-03 | Discharge: 2019-08-03 | Disposition: A | Discharge status: Home / Self Care | Payer: Medicare PPO | Source: Ambulatory Visit | Attending: Radiation Oncology | Admitting: Radiation Oncology

## 2019-08-03 DIAGNOSIS — Z510 Encounter for antineoplastic radiation therapy: Secondary | ICD-10-CM | POA: Insufficient documentation

## 2019-08-03 DIAGNOSIS — C3432 Malignant neoplasm of lower lobe, left bronchus or lung: Secondary | ICD-10-CM | POA: Diagnosis present

## 2019-08-05 ENCOUNTER — Ambulatory Visit
Admission: RE | Admit: 2019-08-05 | Discharge: 2019-08-05 | Disposition: A | Discharge status: Home / Self Care | Payer: Medicare PPO | Source: Ambulatory Visit | Attending: Radiation Oncology | Admitting: Radiation Oncology

## 2019-08-05 DIAGNOSIS — Z510 Encounter for antineoplastic radiation therapy: Secondary | ICD-10-CM | POA: Diagnosis not present

## 2019-08-10 ENCOUNTER — Ambulatory Visit
Admission: RE | Admit: 2019-08-10 | Discharge: 2019-08-10 | Disposition: A | Discharge status: Home / Self Care | Payer: Medicare PPO | Source: Ambulatory Visit | Attending: Radiation Oncology | Admitting: Radiation Oncology

## 2019-08-10 DIAGNOSIS — Z510 Encounter for antineoplastic radiation therapy: Secondary | ICD-10-CM | POA: Diagnosis not present

## 2019-08-21 ENCOUNTER — Encounter: Payer: Self-pay | Admitting: *Deleted

## 2019-08-24 ENCOUNTER — Other Ambulatory Visit: Payer: Medicare Other

## 2019-08-25 ENCOUNTER — Ambulatory Visit: Payer: Medicare Other | Admitting: Oncology

## 2019-08-26 ENCOUNTER — Institutional Professional Consult (permissible substitution): Payer: Medicare PPO | Admitting: Pulmonary Disease

## 2019-09-02 ENCOUNTER — Encounter: Payer: Self-pay | Admitting: Internal Medicine

## 2019-09-02 ENCOUNTER — Other Ambulatory Visit: Payer: Self-pay

## 2019-09-02 ENCOUNTER — Ambulatory Visit: Payer: Medicare PPO | Admitting: Internal Medicine

## 2019-09-02 VITALS — BP 142/82 | HR 97 | Temp 98.9°F | Ht 62.0 in | Wt 182.0 lb

## 2019-09-02 DIAGNOSIS — J449 Chronic obstructive pulmonary disease, unspecified: Secondary | ICD-10-CM

## 2019-09-02 DIAGNOSIS — G4733 Obstructive sleep apnea (adult) (pediatric): Secondary | ICD-10-CM | POA: Diagnosis not present

## 2019-09-02 NOTE — Progress Notes (Signed)
Name: Elizabeth [name] MRN: [id number] DOB: 01/01/1951     CONSULTATION DATE:  REFERRING MD :   CHIEF COMPLAINT: LUNG CANCER   HISTORY OF PRESENT ILLNESS: 69 year old woman who has undergone several CT scans as part of the lung screening program.  She had a CT scan in 2019 that did not reveal any evidence of lung nodules.    In September 2020 she had a repeat CT scan which revealed a small left lower lobe nodule which on 42-month follow-up had increased in size from 4 mm to about 9 mm the patient then had a PET scan and the PET scan showed uptake in the nodule but no other evidence of disease.    It was felt that this was most likely a bronchogenic carcinoma.  She then had a set of pulmonary function studies which revealed an FEV1 of 40% but a DLCO of 100%.      She was referred for CT-guided biopsy although declined by radiology based on low yield and high risk for pneumothorax in this patient with compromised lung function.  She has an FEV1 of 40% and she is on nasal oxygen at night.     She has been on oxygen therapy at night.    Patient with chronic hypoxic respiratory failure on oxygen Patient has a diagnosis of COPD for the last 21 years  Patient currently smokes tobacco 1 pack a day for 50 years smoking history Patient has shortness of breath and dyspnea exertion chronically  No exacerbation at this time No evidence of heart failure at this time No evidence or signs of infection at this time No respiratory distress No fevers, chills, nausea, vomiting, diarrhea No evidence of lower extremity edema No evidence hemoptysis Patient uses inhalers daily with Symbicort and Spiriva This regimen seems to be helping     Patient was referred to radiation oncology for further opinion for presumed bronchogenic carcinoma status post radiation therapy Repeat CT scans are pending        PAST MEDICAL HISTORY :   has a past medical history of Allergy, Anxiety, B12 deficiency,  Carpal tunnel syndrome, COPD (chronic obstructive pulmonary disease) ([place]), Depression, Genetic testing, Hyperlipidemia, Hypertension, Iron deficiency anemia (10/16/2016), Lipoma of skin, Normocytic anemia (10/16/2016), QT prolongation, and Substance abuse ([place]).  has a past surgical history that includes Abdominal hysterectomy; Cesarean section; Breast excisional biopsy (Left); and Tubal ligation. Prior to Admission medications   Medication Sig Start Date End Date Taking? Authorizing Provider  albuterol (PROAIR HFA) 108 (90 Base) MCG/ACT inhaler Frequency:Q4HPRN   Dosage:2   PUFFS  Instructions:  Note:Dose: 2 PUFFS 06/06/10  Yes [provider]  albuterol (PROVENTIL) (2.5 MG/3ML) 0.083% nebulizer solution Inhale into the lungs. 08/30/16  Yes [provider]  albuterol (PROVENTIL) (2.5 MG/3ML) 0.083% nebulizer solution Inhale into the lungs. 08/30/16  Yes [provider]  amLODipine (NORVASC) 5 MG tablet Take 5 mg by mouth daily. 08/16/18  Yes [provider]  budesonide-formoterol (SYMBICORT) 160-4.5 MCG/ACT inhaler Inhale into the lungs. 09/05/16  Yes [provider]  cyanocobalamin (,VITAMIN B-12,) 1000 MCG/ML injection  06/18/19  Yes [provider]  fluticasone (FLONASE) 50 MCG/ACT nasal spray  10/10/16  Yes [provider]  furosemide (LASIX) 20 MG tablet Take 20 mg by mouth daily. 08/09/17  Yes [provider]  Multiple Vitamin (MULTI-VITAMINS) TABS Take by mouth.   Yes [provider]  omeprazole (PRILOSEC OTC) 20 MG tablet Take by mouth. 08/25/09  Yes [provider]  omeprazole (PRILOSEC) 20 MG capsule TAKE 1 CAPSULE(20 MG) BY MOUTH EVERY DAY 08/13/19  Yes [provider]  potassium chloride (KLOR-CON) 20 MEQ packet Take 20 mEq by mouth daily.   Yes [provider]  potassium chloride SA (KLOR-CON) 20 MEQ tablet Take by mouth. 08/13/19 08/12/20 Yes [provider]  rosuvastatin (CRESTOR)  20 MG tablet Take by mouth. 08/20/19 08/19/20 Yes [provider]  tiotropium (SPIRIVA HANDIHALER) 18 MCG inhalation capsule inhale contents of 1 capsule by mouth once daily 09/09/16  Yes [provider]  venlafaxine XR (EFFEXOR-XR) 150 MG 24 hr capsule TK 1 C PO ONCE D 12/27/17  Yes [provider]   Allergies  Allergen Reactions  . Moxifloxacin Itching, Other (See Comments), Photosensitivity, Rash and Swelling    Causes swelling, redness, burning in eyes   . Levofloxacin Other (See Comments)    Other reaction(s): Muscle Pain Other reaction(s): Muscle Pain  . Tetracycline Other (See Comments)  . Amoxicillin-Pot Clavulanate Nausea Only and Other (See Comments)    Other Reaction: DIARRHEA. Other reaction(s): Other (See Comments) Other Reaction: DIARRHEA. Other Reaction: DIARRHEA.  Other reaction(s): Other (See Comments) Other Reaction: DIARRHEA.    FAMILY HISTORY:  family history includes Breast cancer (age of onset: 30) in her mother; Lung cancer in her cousin and paternal aunt; Ovarian cancer (age of onset: 15) in her mother; Stroke in her father. SOCIAL HISTORY:  reports that she has been smoking cigarettes. She has a 13.00 pack-year smoking history. She has never used smokeless tobacco. She reports that she does not drink alcohol or use drugs.    Review of Systems:  Gen:  Denies  fever, sweats, chills weigh loss  HEENT: Denies blurred vision, double vision, ear pain, eye pain, hearing loss, nose bleeds, sore throat Cardiac:  No dizziness, chest pain or heaviness, chest tightness,edema, No JVD Resp:   No cough, -sputum production, +shortness of breath,-wheezing, -hemoptysis,  Gi: Denies swallowing difficulty, stomach pain, nausea or vomiting, diarrhea, constipation, bowel incontinence Gu:  Denies bladder incontinence, burning urine Ext:   Denies Joint pain, stiffness or swelling Skin: Denies  skin rash, easy bruising or bleeding or hives Endoc:  Denies  polyuria, polydipsia , polyphagia or weight change Psych:   Denies depression, insomnia or hallucinations  Other:  All other systems negative    BP (!) 142/82 (BP Location: Left Arm, Cuff Size: Normal)   Pulse 97   Temp 98.9 F (37.2 C) (Oral)   Ht 5\' 2"  (1.575 m)   Wt 182 lb (82.6 kg)   SpO2 95%   BMI 33.29 kg/m     SpO2: 95 % O2 Device: None (Room air)    Physical Examination:   GENERAL:NAD, no fevers, chills, no weakness no fatigue HEAD: Normocephalic, atraumatic.  EYES: PERLA, EOMI No scleral icterus.  NECK: Supple.  PULMONARY: CTA B/L no wheezing, rhonchi, crackles CARDIOVASCULAR: S1 and S2. Regular rate and rhythm. No murmurs GASTROINTESTINAL: Soft, nontender, nondistended. Positive bowel sounds.  MUSCULOSKELETAL: No swelling, clubbing, or edema.  NEUROLOGIC: No gross focal neurological deficits. 5/5 strength all extremities SKIN: No ulceration, lesions, rashes, or cyanosis.  PSYCHIATRIC: Insight, judgment intact. -depression -anxiety ALL OTHER ROS ARE NEGATIVE   MEDICATIONS: I have reviewed all medications and confirmed regimen as documented     IMAGING   CT chest April 2021 Independently reviewed by me today Left lower lobe lung nodule noted      ASSESSMENT AND PLAN SYNOPSIS 69 year old pleasant white female seen today for assessment of  COPD with presumed lung cancer in the left lower lobe along with underlying sleep apnea in the setting of obesity and deconditioned state  COPD moderate to severe based  pulmonary function testing, Gold stage D Seems to be stable at this time Continue Spiriva and Symbicort at this time Albuterol as needed No indication for prednisone or antibiotics at this time  Underlying sleep apnea Will touch base with company to obtain compliance report at next office visit  Lung cancer left lower lobe lung nodule Presumed malignancy status post radiation therapy Follow-up CT chest pending in couple months Follow-up  oncology  Obesity -recommend significant weight loss -recommend changing diet  Deconditioned state -Recommend increased daily activity and exercise  Chronic hypoxic respiratory failure from COPD Continue oxygen at nighttime Uses and Benefits from oxygen therapy  COVID-19 EDUCATION: The signs and symptoms of COVID-19 were discussed with the patient and how to seek care for testing (follow up with PCP or arrange E-visit).  The importance of social distancing was discussed today.  MEDICATION ADJUSTMENTS/LABS AND TESTS ORDERED: Continue inhalers as prescribed Continue oxygen as prescribed Continue sleep apnea therapy with CPAP as prescribed  CURRENT MEDICATIONS REVIEWED AT LENGTH WITH PATIENT TODAY   Patient  satisfied with Plan of action and management. All questions answered  Follow up 6 months  Total Time Spent 47 mins  Corrin Parker, M.D.  [facility]  Medical Director [place] Director [facility] Cardio-Pulmonary Department

## 2019-09-02 NOTE — Patient Instructions (Signed)
Continue oxygen as prescribed  continue inhalers as prescribed Follow-up radiation oncology Recommend smoking cessation

## 2019-09-16 ENCOUNTER — Ambulatory Visit: Payer: Medicare PPO | Admitting: Radiation Oncology

## 2019-09-23 ENCOUNTER — Ambulatory Visit: Payer: Medicare PPO | Admitting: Oncology

## 2019-10-04 ENCOUNTER — Encounter: Payer: Self-pay | Admitting: Oncology

## 2019-10-20 ENCOUNTER — Inpatient Hospital Stay: Payer: Medicare PPO | Attending: Oncology

## 2019-10-20 ENCOUNTER — Other Ambulatory Visit: Payer: Self-pay

## 2019-10-20 DIAGNOSIS — Z803 Family history of malignant neoplasm of breast: Secondary | ICD-10-CM | POA: Diagnosis not present

## 2019-10-20 DIAGNOSIS — Z881 Allergy status to other antibiotic agents status: Secondary | ICD-10-CM | POA: Diagnosis not present

## 2019-10-20 DIAGNOSIS — Z880 Allergy status to penicillin: Secondary | ICD-10-CM | POA: Diagnosis not present

## 2019-10-20 DIAGNOSIS — F17210 Nicotine dependence, cigarettes, uncomplicated: Secondary | ICD-10-CM | POA: Diagnosis not present

## 2019-10-20 DIAGNOSIS — R911 Solitary pulmonary nodule: Secondary | ICD-10-CM | POA: Diagnosis not present

## 2019-10-20 DIAGNOSIS — D510 Vitamin B12 deficiency anemia due to intrinsic factor deficiency: Secondary | ICD-10-CM

## 2019-10-20 DIAGNOSIS — Z801 Family history of malignant neoplasm of trachea, bronchus and lung: Secondary | ICD-10-CM | POA: Insufficient documentation

## 2019-10-20 DIAGNOSIS — J40 Bronchitis, not specified as acute or chronic: Secondary | ICD-10-CM | POA: Insufficient documentation

## 2019-10-20 DIAGNOSIS — Z823 Family history of stroke: Secondary | ICD-10-CM | POA: Diagnosis not present

## 2019-10-20 DIAGNOSIS — F329 Major depressive disorder, single episode, unspecified: Secondary | ICD-10-CM | POA: Diagnosis not present

## 2019-10-20 DIAGNOSIS — R0602 Shortness of breath: Secondary | ICD-10-CM | POA: Insufficient documentation

## 2019-10-20 DIAGNOSIS — Z8041 Family history of malignant neoplasm of ovary: Secondary | ICD-10-CM | POA: Insufficient documentation

## 2019-10-20 DIAGNOSIS — R5383 Other fatigue: Secondary | ICD-10-CM | POA: Diagnosis not present

## 2019-10-20 DIAGNOSIS — K760 Fatty (change of) liver, not elsewhere classified: Secondary | ICD-10-CM | POA: Insufficient documentation

## 2019-10-20 DIAGNOSIS — Z90710 Acquired absence of both cervix and uterus: Secondary | ICD-10-CM | POA: Insufficient documentation

## 2019-10-20 DIAGNOSIS — D509 Iron deficiency anemia, unspecified: Secondary | ICD-10-CM

## 2019-10-20 DIAGNOSIS — Z79899 Other long term (current) drug therapy: Secondary | ICD-10-CM | POA: Insufficient documentation

## 2019-10-20 DIAGNOSIS — D649 Anemia, unspecified: Secondary | ICD-10-CM

## 2019-10-20 DIAGNOSIS — J449 Chronic obstructive pulmonary disease, unspecified: Secondary | ICD-10-CM | POA: Diagnosis not present

## 2019-10-20 DIAGNOSIS — E785 Hyperlipidemia, unspecified: Secondary | ICD-10-CM | POA: Insufficient documentation

## 2019-10-20 LAB — COMPREHENSIVE METABOLIC PANEL
ALT: 33 U/L (ref 0–44)
AST: 43 U/L — ABNORMAL HIGH (ref 15–41)
Albumin: 4.2 g/dL (ref 3.5–5.0)
Alkaline Phosphatase: 93 U/L (ref 38–126)
Anion gap: 9 (ref 5–15)
BUN: 9 mg/dL (ref 8–23)
CO2: 27 mmol/L (ref 22–32)
Calcium: 8.7 mg/dL — ABNORMAL LOW (ref 8.9–10.3)
Chloride: 103 mmol/L (ref 98–111)
Creatinine, Ser: 0.78 mg/dL (ref 0.44–1.00)
GFR calc Af Amer: >60 mL/min (ref >60)
GFR calc non Af Amer: >60 mL/min (ref >60)
Glucose, Bld: 132 mg/dL — ABNORMAL HIGH (ref 70–99)
Potassium: 3.8 mmol/L (ref 3.5–5.1)
Sodium: 139 mmol/L (ref 135–145)
Total Bilirubin: 0.7 mg/dL (ref 0.3–1.2)
Total Protein: 7.2 g/dL (ref 6.5–8.1)

## 2019-10-20 LAB — CBC WITH DIFFERENTIAL/PLATELET
Abs Immature Granulocytes: 0.03 10*3/uL (ref 0.00–0.07)
Basophils Absolute: 0 10*3/uL (ref 0.0–0.1)
Basophils Relative: 0 %
Eosinophils Absolute: 0 10*3/uL (ref 0.0–0.5)
Eosinophils Relative: 0 %
HCT: 38.1 % (ref 36.0–46.0)
Hemoglobin: 12.9 g/dL (ref 12.0–15.0)
Immature Granulocytes: 0 %
Lymphocytes Relative: 23 %
Lymphs Abs: 1.6 10*3/uL (ref 0.7–4.0)
MCH: 28.9 pg (ref 26.0–34.0)
MCHC: 33.9 g/dL (ref 30.0–36.0)
MCV: 85.2 fL (ref 80.0–100.0)
Monocytes Absolute: 0.5 10*3/uL (ref 0.1–1.0)
Monocytes Relative: 7 %
Neutro Abs: 4.8 10*3/uL (ref 1.7–7.7)
Neutrophils Relative %: 70 %
Platelets: 254 10*3/uL (ref 150–400)
RBC: 4.47 MIL/uL (ref 3.87–5.11)
RDW: 13.5 % (ref 11.5–15.5)
WBC: 7 10*3/uL (ref 4.0–10.5)
nRBC: 0 % (ref 0.0–0.2)

## 2019-10-20 LAB — FERRITIN: Ferritin: 189 ng/mL (ref 11–307)

## 2019-10-20 LAB — VITAMIN B12: Vitamin B-12: 489 pg/mL (ref 180–914)

## 2019-10-21 ENCOUNTER — Ambulatory Visit
Admission: RE | Admit: 2019-10-21 | Discharge: 2019-10-21 | Disposition: A | Discharge status: Home / Self Care | Payer: Medicare PPO | Source: Ambulatory Visit | Attending: Oncology | Admitting: Oncology

## 2019-10-21 DIAGNOSIS — R911 Solitary pulmonary nodule: Secondary | ICD-10-CM | POA: Diagnosis present

## 2019-10-21 MED ORDER — IOHEXOL 300 MG/ML  SOLN
75.0000 mL | Freq: Once | INTRAMUSCULAR | Status: AC | PRN
  Administered 2019-10-21: 75 mL via INTRAVENOUS

## 2019-10-22 ENCOUNTER — Other Ambulatory Visit: Payer: Self-pay

## 2019-10-22 ENCOUNTER — Other Ambulatory Visit: Payer: Self-pay | Admitting: *Deleted

## 2019-10-22 ENCOUNTER — Encounter: Payer: Self-pay | Admitting: Oncology

## 2019-10-22 ENCOUNTER — Inpatient Hospital Stay (HOSPITAL_BASED_OUTPATIENT_CLINIC_OR_DEPARTMENT_OTHER): Payer: Medicare PPO | Admitting: Oncology

## 2019-10-22 ENCOUNTER — Other Ambulatory Visit: Payer: Medicare PPO

## 2019-10-22 ENCOUNTER — Ambulatory Visit
Admission: RE | Admit: 2019-10-22 | Discharge: 2019-10-22 | Disposition: A | Discharge status: Home / Self Care | Payer: Medicare PPO | Source: Ambulatory Visit | Attending: Radiation Oncology | Admitting: Radiation Oncology

## 2019-10-22 VITALS — BP 133/68 | HR 97 | Temp 97.4°F | Resp 18 | Wt 184.4 lb

## 2019-10-22 DIAGNOSIS — R918 Other nonspecific abnormal finding of lung field: Secondary | ICD-10-CM | POA: Insufficient documentation

## 2019-10-22 DIAGNOSIS — K760 Fatty (change of) liver, not elsewhere classified: Secondary | ICD-10-CM

## 2019-10-22 DIAGNOSIS — R911 Solitary pulmonary nodule: Secondary | ICD-10-CM | POA: Diagnosis not present

## 2019-10-22 DIAGNOSIS — J40 Bronchitis, not specified as acute or chronic: Secondary | ICD-10-CM

## 2019-10-22 DIAGNOSIS — C3432 Malignant neoplasm of lower lobe, left bronchus or lung: Secondary | ICD-10-CM | POA: Insufficient documentation

## 2019-10-22 DIAGNOSIS — Z923 Personal history of irradiation: Secondary | ICD-10-CM | POA: Diagnosis not present

## 2019-10-22 DIAGNOSIS — D510 Vitamin B12 deficiency anemia due to intrinsic factor deficiency: Secondary | ICD-10-CM

## 2019-10-22 DIAGNOSIS — C3490 Malignant neoplasm of unspecified part of unspecified bronchus or lung: Secondary | ICD-10-CM

## 2019-10-22 MED ORDER — AZITHROMYCIN 250 MG PO TABS
ORAL_TABLET | ORAL | 0 refills | Status: DC
Start: 2019-10-22 — End: 2020-03-02

## 2019-10-22 NOTE — Progress Notes (Signed)
[name]  Hematology/Oncology Follow Up Note [facility] [phone] [phone]  Patient Care Team: Cecile Sheerer, MD as PCP - General (Family Medicine) Telford Nab, RN as Oncology Nurse Navigator [name], MD as Radiation Oncologist (Radiation Oncology)  REASON FOR VISIT Follow up for abnormal CT and PET scan   HISTORY OF PRESENTING ILLNESS: Elizabeth [name] 69 y.o. female PMH HLD, COPD, vitamin B12 deficiency, depression who is referred by Dr.Shapely-Quinn for evaluation and treatment of iron deficiency anemia. Patient reports that 3 months ago she felt "sick" and she was told that her blood counts were low and later she was informed that her iron levels are low too. She started taking over-the-counter iron supplementation for a few months. Patient's primary care doctor recently checked her CBC and iron test. Lab reports from [facility] showed WBC 9.6 hemoglobin 11.4 platelet 330, MCV 83. Iron 25 TIBC 382, saturation 7% ferritin 9. TSH 1.85 B12 631. She also had stool occult done in May 2018 which were negative. The patient denies any change of bowel habits black stool or blood in the stool. She denies any weight loss fever or night sweats. She feels weak and lack of exercise endurance.  Patient reports that she has a long history of vitamin B12 deficiency. She gets parenteral vitamin shots periodically, managed by PCP and was given by her daughter.  Patient reports that she has had hysterectomy at age of 65. She hasn't had her colonoscopy screening done yet. She denies any bleeding events though she noted easy bruising. She denies any symptoms. He reports that her mother passed away at age of 70 with ovarian cancer and for some unknown reason autopsy was requested at that time and she was told her [name] also had breast cancer. Denies any other family cancer history. She is a active smoker, she denies alcohol  use. She consume vegetarian diet and brew and drink tea every day.  She was referred to see GI Dr.Vanga previously and was recommend to have EGD. Patient declined.    # .Her family history revealed both ovarian and breast cancer, per patient no one else in the family has cancer. Patient reports having mammogram done this year April. Discussed with patient the need of genetic counseling and genetic testing. Testing did not reveal any pathogenic mutation in any of these genes. A copy of the genetic test report will be scanned into Epic under the media tab. The genes analyzed were the 47 genes on [facility]'s Common Cancers panel (APC, ATM, AXIN2, BARD1, BMPR1A, BRCA1, BRCA2, BRIP1, CDH1, CDK4, CDKN2A, CHEK2, CTNNA1, DICER1, EPCAM, GREM1, HOXB13, KIT, MEN1, MLH1, MSH2, MSH3, MSH6, MUTYH, NBN, NF1, NTHL1, PALB2, PDGFRA, PMS2, POLD1, POLE, PTEN, RAD50, RAD51C, RAD51D, SDHA, SDHB, SDHC, SDHD, SMAD4, SMARCA4, STK11, TP53, TSC1, TSC2, VHL).  # chest CT screening done on 12/10/2018. There was a new 5.8 mm left lower lobe nodule and short-term follow-up 6 months was recommended 06/10/2019, follow-up CT chest without contrast showed enlarging left lower lobe nodule highly worrisome for primary bronchogenic carcinoma. Also hepatic steatosis and emphysema were noted on prior CT scans. 06/18/2019, PET scan showed 9 mm hypermetabolic left lower lobe pulmonary nodule, highly suspicious for bronchogenic carcinoma.  No evidence of thoracic lymph node or distant metastasis. Patient met Dr. Genevive Bi and declined surgery.  Patient underwent SBRT to left lower lobe nodule.  INTERVAL HISTORY Patient with above history reviewed by me today presents for follow-up of abnormal CT and PET scan. Patient  was previously seen by me for treatment of iron deficiency, vitamin B12 deficiency. Patient reports having increased cough, productive with greenish-yellowish sputum.  No fever or chills.  She feels that she had a bronchitis.  Chronic  shortness of breath and chronic fatigue unchanged. She is disappointed because radiation treatments did "not work the way they should."  Review of Systems  Constitutional: Positive for fatigue. Negative for appetite change, chills and fever.  HENT:   Negative for hearing loss and voice change.   Eyes: Negative for eye problems.  Respiratory: Positive for cough and shortness of breath. Negative for chest tightness.   Cardiovascular: Negative for chest pain.  Gastrointestinal: Negative for abdominal distention, abdominal pain and blood in stool.  Endocrine: Negative for hot flashes.  Genitourinary: Negative for difficulty urinating and frequency.   Musculoskeletal: Negative for arthralgias.  Skin: Negative for itching and rash.  Neurological: Negative for extremity weakness.  Hematological: Negative for adenopathy.  Psychiatric/Behavioral: Negative for confusion.    MEDICAL HISTORY: Past Medical History:  Diagnosis Date  . Allergy   . Anxiety   . B12 deficiency   . Carpal tunnel syndrome   . COPD (chronic obstructive pulmonary disease) ([place])   . Depression   . Genetic testing    Common Cancers panel (47 genes) @ [facility] - No pathogenic mutations detected  . Hyperlipidemia   . Hypertension   . Iron deficiency anemia 10/16/2016  . Lipoma of skin   . Normocytic anemia 10/16/2016  . QT prolongation   . Substance abuse ([place])     SURGICAL HISTORY: Past Surgical History:  Procedure Laterality Date  . ABDOMINAL HYSTERECTOMY    . BREAST EXCISIONAL BIOPSY Left   . CESAREAN SECTION    . TUBAL LIGATION      SOCIAL HISTORY: Social History   Socioeconomic History  . Marital status: Divorced    Spouse name: Not on file  . Number of children: Not on file  . Years of education: Not on file  . Highest education level: Not on file  Occupational History  . Not on file  Tobacco Use  . Smoking status: Current Every Day Smoker    Packs/day: 0.25    Years: 52.00    Pack years:  13.00    Types: Cigarettes  . Smokeless tobacco: Never Used  . Tobacco comment: 1 ciggarette a day  Vaping Use  . Vaping Use: Some days  Substance and Sexual Activity  . Alcohol use: No  . Drug use: No  . Sexual activity: Not Currently  Other Topics Concern  . Not on file  Social History Narrative  . Not on file   Social Determinants of Health   Financial Resource Strain:   . Difficulty of Paying Living Expenses:   Food Insecurity:   . Worried About Charity fundraiser in the Last Year:   . Arboriculturist in the Last Year:   Transportation Needs:   . Film/video editor (Medical):   Marland Kitchen Lack of Transportation (Non-Medical):   Physical Activity:   . Days of Exercise per Week:   . Minutes of Exercise per Session:   Stress:   . Feeling of Stress :   Social Connections:   . Frequency of Communication with Friends and Family:   . Frequency of Social Gatherings with Friends and Family:   . Attends Religious Services:   . Active Member of Clubs or Organizations:   . Attends Archivist Meetings:   Marland Kitchen Marital  Status:   Intimate Partner Violence:   . Fear of Current or Ex-Partner:   . Emotionally Abused:   Marland Kitchen Physically Abused:   . Sexually Abused:     FAMILY HISTORY Family History  Problem Relation Age of Onset  . Ovarian cancer Mother 67       deceased 40  . Breast cancer Mother 61  . Stroke Father   . Lung cancer Paternal Aunt   . Lung cancer Cousin     ALLERGIES:  is allergic to moxifloxacin, levofloxacin, tetracycline, and amoxicillin-pot clavulanate.  MEDICATIONS:  Current Outpatient Medications  Medication Sig Dispense Refill  . albuterol (PROAIR HFA) 108 (90 Base) MCG/ACT inhaler Frequency:Q4HPRN   Dosage:2   PUFFS  Instructions:  Note:Dose: 2 PUFFS    . albuterol (PROVENTIL) (2.5 MG/3ML) 0.083% nebulizer solution Inhale into the lungs.    Marland Kitchen amLODipine (NORVASC) 5 MG tablet Take 5 mg by mouth daily.    . budesonide-formoterol (SYMBICORT) 160-4.5  MCG/ACT inhaler Inhale into the lungs.    . cyanocobalamin (,VITAMIN B-12,) 1000 MCG/ML injection     . fluticasone (FLONASE) 50 MCG/ACT nasal spray   1  . furosemide (LASIX) 20 MG tablet Take 20 mg by mouth daily.  0  . Multiple Vitamin (MULTI-VITAMINS) TABS Take by mouth.    Marland Kitchen omeprazole (PRILOSEC OTC) 20 MG tablet Take by mouth.    . potassium chloride SA (KLOR-CON) 20 MEQ tablet Take by mouth.    . rosuvastatin (CRESTOR) 20 MG tablet Take by mouth.    . tiotropium (SPIRIVA HANDIHALER) 18 MCG inhalation capsule inhale contents of 1 capsule by mouth once daily    . Venlafaxine HCl 225 MG TB24 Take by mouth.     No current facility-administered medications for this visit.    PHYSICAL EXAMINATION:  ECOG PERFORMANCE STATUS: 1 - Symptomatic but completely ambulatory  Vitals:   10/22/19 1021  BP: (!) 133/68  Pulse: 97  Resp: 18  Temp: (!) 97.4 F (36.3 C)  SpO2: 96%   Filed Weights   10/22/19 1021  Weight: 184 lb 6.4 oz (83.6 kg)     Physical Exam Constitutional:      General: She is not in acute distress.    Appearance: She is not diaphoretic.  HENT:     Head: Normocephalic and atraumatic.     Nose: Nose normal.     Mouth/Throat:     Pharynx: No oropharyngeal exudate.  Eyes:     General: No scleral icterus.    Pupils: Pupils are equal, round, and reactive to light.  Cardiovascular:     Rate and Rhythm: Normal rate and regular rhythm.     Heart sounds: No murmur heard.   Pulmonary:     Effort: Pulmonary effort is normal. No respiratory distress.     Breath sounds: No rales.     Comments: Decreased breath sound bilaterally Chest:     Chest wall: No tenderness.  Abdominal:     General: There is no distension.     Palpations: Abdomen is soft.     Tenderness: There is no abdominal tenderness.  Musculoskeletal:        General: Normal range of motion.     Cervical back: Normal range of motion and neck supple.  Skin:    General: Skin is warm and dry.      Findings: No erythema.  Neurological:     Mental Status: She is alert and oriented to person, place, and time.  Cranial Nerves: No cranial nerve deficit.     Motor: No abnormal muscle tone.     Coordination: Coordination normal.  Psychiatric:        Mood and Affect: Affect normal.       LABORATORY DATA: I have personally reviewed the data as listed:   Appointment on 10/20/2019  Component Date Value Ref Range Status  . Vitamin B-12 10/20/2019 489  180 - 914 pg/mL Final   Comment: (NOTE) This assay is not validated for testing neonatal or myeloproliferative syndrome specimens for Vitamin B12 levels. Performed at [facility], [place] [address], [place], [place] [address]   . Sodium 10/20/2019 139  135 - 145 mmol/L Final  . Potassium 10/20/2019 3.8  3.5 - 5.1 mmol/L Final  . Chloride 10/20/2019 103  98 - 111 mmol/L Final  . CO2 10/20/2019 27  22 - 32 mmol/L Final  . Glucose, Bld 10/20/2019 132* 70 - 99 mg/dL Final   Glucose reference range applies only to samples taken after fasting for at least 8 hours.  . BUN 10/20/2019 9  8 - 23 mg/dL Final  . Creatinine, Ser 10/20/2019 0.78  0.44 - 1.00 mg/dL Final  . Calcium 10/20/2019 8.7* 8.9 - 10.3 mg/dL Final  . Total Protein 10/20/2019 7.2  6.5 - 8.1 g/dL Final  . Albumin 10/20/2019 4.2  3.5 - 5.0 g/dL Final  . AST 10/20/2019 43* 15 - 41 U/L Final  . ALT 10/20/2019 33  0 - 44 U/L Final  . Alkaline Phosphatase 10/20/2019 93  38 - 126 U/L Final  . Total Bilirubin 10/20/2019 0.7  0.3 - 1.2 mg/dL Final  . GFR calc non Af Amer 10/20/2019 >60  >60 mL/min Final  . GFR calc Af Amer 10/20/2019 >60  >60 mL/min Final  . Anion gap 10/20/2019 9  5 - 15 Final   Performed at [facility], [address], [place], [place] [address]  . WBC 10/20/2019 7.0  4.0 - 10.5 K/uL Final  . RBC 10/20/2019 4.47  3.87 - 5.11 MIL/uL Final  . Hemoglobin 10/20/2019 12.9  12.0 - 15.0 g/dL Final  . HCT 10/20/2019 38.1  36 - 46 % Final  . MCV  10/20/2019 85.2  80.0 - 100.0 fL Final  . MCH 10/20/2019 28.9  26.0 - 34.0 pg Final  . MCHC 10/20/2019 33.9  30.0 - 36.0 g/dL Final  . RDW 10/20/2019 13.5  11.5 - 15.5 % Final  . Platelets 10/20/2019 254  150 - 400 K/uL Final  . nRBC 10/20/2019 0.0  0.0 - 0.2 % Final  . Neutrophils Relative % 10/20/2019 70  % Final  . Neutro Abs 10/20/2019 4.8  1.7 - 7.7 K/uL Final  . Lymphocytes Relative 10/20/2019 23  % Final  . Lymphs Abs 10/20/2019 1.6  0.7 - 4.0 K/uL Final  . Monocytes Relative 10/20/2019 7  % Final  . Monocytes Absolute 10/20/2019 0.5  0 - 1 K/uL Final  . Eosinophils Relative 10/20/2019 0  % Final  . Eosinophils Absolute 10/20/2019 0.0  0 - 0 K/uL Final  . Basophils Relative 10/20/2019 0  % Final  . Basophils Absolute 10/20/2019 0.0  0 - 0 K/uL Final  . Immature Granulocytes 10/20/2019 0  % Final  . Abs Immature Granulocytes 10/20/2019 0.03  0.00 - 0.07 K/uL Final   Performed at [facility], [address], [place], [place] [address]  . Ferritin 10/20/2019 189  11 - 307 ng/mL Final   Performed  at [facility], [address], [place], [place] [address]    ASSESSMENT/PLAN 1. Lung nodule   2. Fatty liver disease, nonalcoholic   3. Bronchitis   4. Pernicious anemia    #Lung nodule, Status post SBRT. CT images were independently reviewed by me and discussed with patient. Decrease size of left lower lobe nodule, compatible with treatment response.  To follow-up with me in 6 months.  Patient will see Dr. Baruch Gouty and I will defer radiation oncology to order next CT scan.  #Cough with greenish/yellowish sputum.  Shortness of breath. Likely bronchitis.  Discussed with patient and I will send her a course of Z-Pak.  Advised patient to call primary care provider if symptoms does not improve. #Fatty liver disease, discussed with her about healthy diet, weight loss and exercise. #Emphysema, she follows up with Dr. Raul Del. #Tobacco use, smoke cessation was discussed  with patient. #Pernicious anemia, hemoglobin is stable.  History of B12 deficiency, previously declined endoscopy and GI work-up.  Continue follow-up with primary care provider and have parenteral vitamin B12 injections. . Follow-up in 6 months  Earlie Server, MD, PhD Hematology Oncology [facility] Pager- [phone] 10/22/2019

## 2019-10-22 NOTE — Progress Notes (Signed)
Radiation Oncology Follow up Note  Name: Elizabeth [name]   Date:   10/22/2019 MRN:  [id number] DOB: Apr 21, 1950    This 69 y.o. female presents to the clinic today for 38-month follow-up status post SBRT to left lower lobe for stage I non-small cell lung cancer.  REFERRING PROVIDER: Baldo Ash*  HPI: Patient is a 69 year old female now out 2 months having completed SBRT to her left lower lobe for stage I non-small cell lung cancer seen today in routine follow-up from a pulmonary standpoint she is doing well.  She continues on nightly use of home oxygen.  She specifically [place] cough hemoptysis chest tightness or any worsening of her pulmonary symptoms. .  She had a CT scan of the chest yesterday which I have reviewed shows interval decrease in the size of the left lower lobe lung nodule compatible with response to therapy.  She has some other small lung nodules which are stable.  COMPLICATIONS OF TREATMENT: none  FOLLOW UP COMPLIANCE: keeps appointments   PHYSICAL EXAM:  There were no vitals taken for this visit. Well-developed well-nourished patient in NAD. HEENT reveals PERLA, EOMI, discs not visualized.  Oral cavity is clear. No oral mucosal lesions are identified. Neck is clear without evidence of cervical or supraclavicular adenopathy. Lungs are clear to A&P. Cardiac examination is essentially unremarkable with regular rate and rhythm without murmur rub or thrill. Abdomen is benign with no organomegaly or masses noted. Motor sensory and DTR levels are equal and symmetric in the upper and lower extremities. Cranial nerves II through XII are grossly intact. Proprioception is intact. No peripheral adenopathy or edema is identified. No motor or sensory levels are noted. Crude visual fields are within normal range.  RADIOLOGY RESULTS: CT scans reviewed compatible with above-stated findings  PLAN: Present time patient is doing well with good response to SBRT treatment.  And pleased with  her overall progress.  I have asked to see her at 6 months for follow-up.  Patient knows to call with any concerns.  She continues close follow-up care with pulmonology.  I would like to take this opportunity to thank you for allowing me to participate in the care of your patient.[name], MD

## 2019-10-22 NOTE — Progress Notes (Signed)
Patient here for follow up. No new concerns voiced. Patient reports she "stays short winded and never has energy."  She is disappointed because radiation treatments did "not work the way they should." She feels like she has bronchitis starting.

## 2019-10-23 ENCOUNTER — Telehealth: Payer: Self-pay | Admitting: *Deleted

## 2019-10-23 ENCOUNTER — Other Ambulatory Visit: Payer: Self-pay

## 2019-10-23 MED ORDER — AMOXICILLIN 500 MG PO TABS
500.0000 mg | ORAL_TABLET | Freq: Two times a day (BID) | ORAL | 0 refills | Status: DC
Start: 2019-10-23 — End: 2020-03-02

## 2019-10-23 NOTE — Telephone Encounter (Signed)
Patient called reporting that she forgot to tell Dr Tasia Catchings yesterday that she has QT prolongation and some medications interfere with that making it worse. She is asking if the Azithromycin will cause problems with that. Please advise

## 2019-10-23 NOTE — Telephone Encounter (Signed)
I called patient and she states that she has already had 4 appointments this week and does not want to go to another one, it if would be possible for Dr. Tasia Catchings to send antibiotic. Pt states that her cardiologist gave her a list of antibiotics that she is ok to take. Amoxicillin is the only one she wrote down. Pt has amoxicillin listed on her allergy list but she says that she has taken in the past and is ok with taking it, and requested it be removed from allergy list. Amoxicillin 500 mg has been sent to pharmacy and pt is aware.   Pharmacy notified to cancel Azithromycin and that new rx for Amoxicillin has been sent.

## 2019-10-23 NOTE — Telephone Encounter (Signed)
Per Dr. Tasia Catchings: Please let her know that Azithromycin can increase risk of QT prolongation and thank her for the reminding. I recommend her to consult her PCP/cardiologist to see if any other antibiotics can be used for her bronchitis. Thanks  Please call pharmacy and cancel Rx.

## 2019-12-30 ENCOUNTER — Encounter: Payer: Self-pay | Admitting: *Deleted

## 2020-01-05 ENCOUNTER — Other Ambulatory Visit: Payer: Medicare PPO

## 2020-01-06 ENCOUNTER — Ambulatory Visit: Payer: Medicare PPO | Admitting: Oncology

## 2020-01-06 ENCOUNTER — Other Ambulatory Visit: Payer: Medicare PPO

## 2020-02-26 ENCOUNTER — Telehealth: Payer: Self-pay | Admitting: Internal Medicine

## 2020-02-26 MED ORDER — PREDNISONE 20 MG PO TABS
20.0000 mg | ORAL_TABLET | Freq: Every day | ORAL | 0 refills | Status: DC
Start: 2020-02-26 — End: 2020-04-20

## 2020-02-26 NOTE — Telephone Encounter (Signed)
Primary Pulmonologist: Kasa Last office visit and with whom: 09/2019 with [facility] What do we see them for (pulmonary problems): COPD-OSA Last OV assessment/plan:   ASSESSMENT AND PLAN SYNOPSIS 69 year old pleasant white female seen today for assessment of COPD with presumed lung cancer in the left lower lobe along with underlying sleep apnea in the setting of obesity and deconditioned state  COPD moderate to severe based  pulmonary function testing, Gold stage D Seems to be stable at this time Continue Spiriva and Symbicort at this time Albuterol as needed No indication for prednisone or antibiotics at this time  Underlying sleep apnea Will touch base with company to obtain compliance report at next office visit  Lung cancer left lower lobe lung nodule Presumed malignancy status post radiation therapy Follow-up CT chest pending in couple months Follow-up oncology  Obesity -recommend significant weight loss -recommend changing diet  Deconditioned state -Recommend increased daily activity and exercise  Chronic hypoxic respiratory failure from COPD Continue oxygen at nighttime Uses and Benefits from oxygen therapy  COVID-19 EDUCATION: The signs and symptoms of COVID-19 were discussed with the patient and how to seek care for testing (follow up with PCP or arrange E-visit).  The importance of social distancing was discussed today.  MEDICATION ADJUSTMENTS/LABS AND TESTS ORDERED: Continue inhalers as prescribed Continue oxygen as prescribed Continue sleep apnea therapy with CPAP as prescribed  CURRENT MEDICATIONS REVIEWED AT [facility]  Was appointment offered to patient (explain)?  Office not open today    Reason for call: Patient is still having green thick mucus with fevers and shortness of breath since 01/06/20.  Patient went to Urgent [facility] at [facility] and they gave her two rounds of doxycycline, patient states nothing is working. Patient does not have a way  to check her sats. She wears 3L oxygen at night with her CPAP machine.  Patient cannot take anything for her fevers as she has QT prolongation and nothing that effects her liver.  She has not have any doxycycline since October.  Patient feels like her lungs are stiffening up.   Dr. Mortimer Fries please advise.    Allergies  Allergen Reactions  . Moxifloxacin Itching, Other (See Comments), Photosensitivity, Rash and Swelling    Causes swelling, redness, burning in eyes   . Levofloxacin Other (See Comments)    Other reaction(s): Muscle Pain Other reaction(s): Muscle Pain  . Tetracycline Other (See Comments)    Immunization History  Administered Date(s) Administered  . Influenza, High Dose Seasonal PF 01/15/2017  . Influenza, Seasonal, Injecte, Preservative Fre 12/07/2011  . Influenza,inj,Quad PF,6+ Mos 01/07/2019  . Influenza-Unspecified 12/28/2010, 01/03/2012, 12/25/2012, 12/15/2014, 01/14/2016, 01/15/2017, 12/31/2017  . PFIZER SARS-COV-2 Vaccination 04/28/2019, 05/19/2019, 01/01/2020  . Pneumococcal Conjugate-13 02/16/2014  . Pneumococcal Polysaccharide-23 09/01/2009, 04/27/2016  . Tdap 12/01/2009, 09/26/2010

## 2020-02-26 NOTE — Telephone Encounter (Signed)
Called spoke with patient's daughter and patient. Let them know Dr. Zoila Shutter recommendations.  Patient has appointment on Wednesday in office.   I did instruct patient to go to UC or ED if she worsened over the weekend.  nothing further needed at this time.

## 2020-02-26 NOTE — Telephone Encounter (Signed)
Pred 20 mg daily for 10 days

## 2020-03-02 ENCOUNTER — Encounter: Payer: Self-pay | Admitting: Internal Medicine

## 2020-03-02 ENCOUNTER — Other Ambulatory Visit: Payer: Self-pay

## 2020-03-02 ENCOUNTER — Ambulatory Visit: Payer: Medicare PPO | Admitting: Internal Medicine

## 2020-03-02 VITALS — BP 140/78 | HR 98 | Temp 97.1°F | Ht 62.0 in | Wt 181.0 lb

## 2020-03-02 DIAGNOSIS — J449 Chronic obstructive pulmonary disease, unspecified: Secondary | ICD-10-CM

## 2020-03-02 DIAGNOSIS — J9611 Chronic respiratory failure with hypoxia: Secondary | ICD-10-CM

## 2020-03-02 DIAGNOSIS — R0602 Shortness of breath: Secondary | ICD-10-CM

## 2020-03-02 DIAGNOSIS — F17210 Nicotine dependence, cigarettes, uncomplicated: Secondary | ICD-10-CM

## 2020-03-02 DIAGNOSIS — C3432 Malignant neoplasm of lower lobe, left bronchus or lung: Secondary | ICD-10-CM | POA: Diagnosis not present

## 2020-03-02 NOTE — Progress Notes (Signed)
Name: Elizabeth [name] MRN: [id number] DOB: 1969-05-16    CHIEF COMPLAINT:  Follow up COPD Follow up OSA Follow up Lung cancer   HPI Patient with abnormal CT chest presumed lung cancer Status post radiation therapy seems to be responding to therapy     Previous CT scan findings September 2020 repeat CT shows left lower lobe nodule increased from 4 mm to 9 mm with positive PET scan showing increased uptake  Patient was then referred to radiation oncology due to the fact that she had significant and severe COPD based on her pulmonary function testing with an FEV1 of 40%  She was referred for CT-guided biopsy although declined by radiology based on her low yield and high risk of pneumothorax in the setting of compromised lung function and on oxygen therapy  Patient currently on chronic oxygen therapy She needs this for survival Patient has diagnosis of COPD for the last 21 years   Patient currently smokes tobacco 1 pack a day for 50 years smoking history Patient has shortness of breath and dyspnea exertion chronically   +exacerbation at this time-seems to be responding to prednisone No evidence of heart failure at this time No respiratory distress + fevers No evidence of lower extremity edema No evidence hemoptysis Patient needs Covid testing as soon as possible   radiation oncology-per report  doing well with good response to SBRT treatment   bronchogenic carcinoma status post radiation therapy   Smoking Assessment and Cessation Counseling   Upon further questioning, Patient smokes 1/2 PPD  I have advised patient to quit/stop smoking as soon as possible due to high risk for multiple medical problems  Patient is NOT willing to quit smoking  I have advised patient that we can assist and have options of Nicotine replacement therapy. I also advised patient on behavioral therapy and can provide oral medication therapy in conjunction with the other therapies  Follow  up next Office visit  for assessment of smoking cessation  Smoking cessation counseling advised for 4 minutes    PAST MEDICAL HISTORY :   has a past medical history of Allergy, Anxiety, B12 deficiency, Carpal tunnel syndrome, COPD (chronic obstructive pulmonary disease) ([place]), Depression, Genetic testing, Hyperlipidemia, Hypertension, Iron deficiency anemia (10/16/2016), Lipoma of skin, Normocytic anemia (10/16/2016), QT prolongation, and Substance abuse ([place]).  has a past surgical history that includes Abdominal hysterectomy; Cesarean section; Breast excisional biopsy (Left); and Tubal ligation. Prior to Admission medications   Medication Sig Start Date End Date Taking? Authorizing Provider  albuterol (PROAIR HFA) 108 (90 Base) MCG/ACT inhaler Frequency:Q4HPRN   Dosage:2   PUFFS  Instructions:  Note:Dose: 2 PUFFS 06/06/10  Yes [provider]  albuterol (PROVENTIL) (2.5 MG/3ML) 0.083% nebulizer solution Inhale into the lungs. 08/30/16  Yes [provider]  albuterol (PROVENTIL) (2.5 MG/3ML) 0.083% nebulizer solution Inhale into the lungs. 08/30/16  Yes [provider]  amLODipine (NORVASC) 5 MG tablet Take 5 mg by mouth daily. 08/16/18  Yes [provider]  budesonide-formoterol (SYMBICORT) 160-4.5 MCG/ACT inhaler Inhale into the lungs. 09/05/16  Yes [provider]  cyanocobalamin (,VITAMIN B-12,) 1000 MCG/ML injection  06/18/19  Yes [provider]  fluticasone (FLONASE) 50 MCG/ACT nasal spray  10/10/16  Yes [provider]  furosemide (LASIX) 20 MG tablet Take 20 mg by mouth daily. 08/09/17  Yes [provider]  Multiple Vitamin (MULTI-VITAMINS) TABS Take by mouth.   Yes [provider]  omeprazole (PRILOSEC OTC) 20 MG tablet Take by mouth. 08/25/09  Yes [provider]  omeprazole (PRILOSEC) 20 MG capsule TAKE 1 CAPSULE(20 MG) BY MOUTH EVERY DAY 08/13/19  Yes [provider]  potassium chloride  (KLOR-CON) 20 MEQ packet Take 20 mEq by mouth daily.   Yes [provider]  potassium chloride SA (KLOR-CON) 20 MEQ tablet Take by mouth. 08/13/19 08/12/20 Yes [provider]  rosuvastatin (CRESTOR) 20 MG tablet Take by mouth. 08/20/19 08/19/20 Yes [provider]  tiotropium (SPIRIVA HANDIHALER) 18 MCG inhalation capsule inhale contents of 1 capsule by mouth once daily 09/09/16  Yes [provider]  venlafaxine XR (EFFEXOR-XR) 150 MG 24 hr capsule TK 1 C PO ONCE D 12/27/17  Yes [provider]   Allergies  Allergen Reactions  . Moxifloxacin Itching, Other (See Comments), Photosensitivity, Rash and Swelling    Causes swelling, redness, burning in eyes   . Levofloxacin Other (See Comments)    Other reaction(s): Muscle Pain Other reaction(s): Muscle Pain  . Tetracycline Other (See Comments)    FAMILY HISTORY:  family history includes Breast cancer (age of onset: 58) in her mother; Lung cancer in her cousin and paternal aunt; Ovarian cancer (age of onset: 10) in her mother; Stroke in her father. SOCIAL HISTORY:  reports that she has been smoking cigarettes. She has a 13.00 pack-year smoking history. She has never used smokeless tobacco. She reports that she does not drink alcohol and does not use drugs.     Review of Systems:  Gen:  Denies  fever, sweats, chills weight loss  HEENT: Denies blurred vision, double vision, ear pain, eye pain, hearing loss, nose bleeds, sore throat Cardiac:  No dizziness, chest pain or heaviness, chest tightness,edema, No JVD Resp:   No cough, -sputum production, -shortness of breath,-wheezing, -hemoptysis,  Gi: Denies swallowing difficulty, stomach pain, nausea or vomiting, diarrhea, constipation, bowel incontinence Gu:  Denies bladder incontinence, burning urine Ext:   Denies Joint pain, stiffness or swelling Skin: Denies  skin rash, easy bruising or bleeding or hives Endoc:  Denies polyuria, polydipsia ,  polyphagia or weight change Psych:   Denies depression, insomnia or hallucinations  Other:  All other systems negative     There were no vitals taken for this visit.       Physical Examination:   General Appearance: No distress  Neuro:without focal findings,  speech normal,  HEENT: PERRLA, EOM intact.   Pulmonary: normal breath sounds, No wheezing.  CardiovascularNormal S1,S2.  No m/r/g.   Abdomen: Benign, Soft, non-tender. Renal:  No costovertebral tenderness  GU:  Not performed at this time. Endoc: No evident thyromegaly Skin:   warm, no rashes, no ecchymosis  Extremities: normal, no cyanosis, clubbing. PSYCHIATRIC: Mood, affect within normal limits.   ALL OTHER ROS ARE NEGATIVE     IMAGING   CT chest April 2021 Independently reviewed by me today Left lower lobe lung nodule noted      ASSESSMENT AND PLAN SYNOPSIS  69 year old pleasant white female seen today for follow-up assessment for COPD with presumed lung cancer in the left lower lobe with underlying sleep apnea in the setting of obesity and deconditioned state  COPD is moderate to severe based on pulmonary function testing Gold stage D Seems to be stable at this time Continue inhaled anticholinergic with Spiriva Continue inhaled steroid and long-acting beta agonist with Symbicort Albuterol as needed +COPD exacerbation-continue prednisone as prescribed  Underlying sleep apnea Will need to assess with compliance report Patient follows up at [facility] cancer left lower lobe  lung nodule Seems to be responding to radiation therapy Follow-up rad  oncology  Obesity -recommend significant weight loss -recommend changing diet  Deconditioned state -Recommend increased daily activity and exercise  Chronic hypoxic respiratory failure from COPD Continue oxygen at bedtime Use and benefits from oxygen therapy  Smoking cessation strongly advised  COVID-19 EDUCATION: The signs and symptoms of  COVID-19 were discussed with the patient and how to seek care for testing.  The importance of social distancing was discussed today. Hand Washing Techniques and avoid touching face was advised.     MEDICATION ADJUSTMENTS/LABS AND TESTS ORDERED: Continue inhalers as prescribed Continue oxygen as prescribed Continue sleep apnea therapy with CPAP as prescribed Recommend COVID TESTING  CURRENT MEDICATIONS REVIEWED AT LENGTH WITH PATIENT TODAY   Patient satisfied with Plan of action and management. All questions answered  Follow up in 1 year  TOTAL TIME SPENT 37 mins   Kamaria Lucia Patricia Pesa, M.D.  [facility]  Medical Director [place] Director [facility] Cardio-Pulmonary Department

## 2020-03-02 NOTE — Patient Instructions (Addendum)
Continue inhalers as prescribed Continue oxygen as prescribed Continue sleep apnea therapy with CPAP  FOLLOW UP RADIATION ONCOLOGY  RECOMMEND COVID TESTING ASAP

## 2020-04-13 ENCOUNTER — Other Ambulatory Visit: Payer: Self-pay

## 2020-04-13 ENCOUNTER — Ambulatory Visit
Admission: RE | Admit: 2020-04-13 | Discharge: 2020-04-13 | Disposition: A | Discharge status: Home / Self Care | Payer: Medicare PPO | Source: Ambulatory Visit | Attending: Radiation Oncology | Admitting: Radiation Oncology

## 2020-04-13 DIAGNOSIS — C3490 Malignant neoplasm of unspecified part of unspecified bronchus or lung: Secondary | ICD-10-CM | POA: Diagnosis present

## 2020-04-13 LAB — POCT I-STAT CREATININE: Creatinine, Ser: 0.9 mg/dL (ref 0.44–1.00)

## 2020-04-13 MED ORDER — IOHEXOL 300 MG/ML  SOLN
75.0000 mL | Freq: Once | INTRAMUSCULAR | Status: AC | PRN
  Administered 2020-04-13: 75 mL via INTRAVENOUS

## 2020-04-15 ENCOUNTER — Other Ambulatory Visit: Payer: Self-pay

## 2020-04-15 ENCOUNTER — Inpatient Hospital Stay: Payer: Medicare PPO | Attending: Oncology

## 2020-04-15 ENCOUNTER — Encounter: Payer: Self-pay | Admitting: *Deleted

## 2020-04-15 DIAGNOSIS — D509 Iron deficiency anemia, unspecified: Secondary | ICD-10-CM

## 2020-04-15 DIAGNOSIS — C3490 Malignant neoplasm of unspecified part of unspecified bronchus or lung: Secondary | ICD-10-CM

## 2020-04-15 DIAGNOSIS — R911 Solitary pulmonary nodule: Secondary | ICD-10-CM | POA: Insufficient documentation

## 2020-04-15 LAB — CBC WITH DIFFERENTIAL/PLATELET
Abs Immature Granulocytes: 0.03 10*3/uL (ref 0.00–0.07)
Basophils Absolute: 0 10*3/uL (ref 0.0–0.1)
Basophils Relative: 0 %
Eosinophils Absolute: 0 10*3/uL (ref 0.0–0.5)
Eosinophils Relative: 0 %
HCT: 38.6 % (ref 36.0–46.0)
Hemoglobin: 13 g/dL (ref 12.0–15.0)
Immature Granulocytes: 0 %
Lymphocytes Relative: 28 %
Lymphs Abs: 2.3 10*3/uL (ref 0.7–4.0)
MCH: 28.7 pg (ref 26.0–34.0)
MCHC: 33.7 g/dL (ref 30.0–36.0)
MCV: 85.2 fL (ref 80.0–100.0)
Monocytes Absolute: 0.9 10*3/uL (ref 0.1–1.0)
Monocytes Relative: 10 %
Neutro Abs: 5 10*3/uL (ref 1.7–7.7)
Neutrophils Relative %: 62 %
Platelets: 289 10*3/uL (ref 150–400)
RBC: 4.53 MIL/uL (ref 3.87–5.11)
RDW: 13.2 % (ref 11.5–15.5)
WBC: 8.3 10*3/uL (ref 4.0–10.5)
nRBC: 0 % (ref 0.0–0.2)

## 2020-04-15 LAB — COMPREHENSIVE METABOLIC PANEL
ALT: 21 U/L (ref 0–44)
AST: 26 U/L (ref 15–41)
Albumin: 4.1 g/dL (ref 3.5–5.0)
Alkaline Phosphatase: 94 U/L (ref 38–126)
Anion gap: 6 (ref 5–15)
BUN: 11 mg/dL (ref 8–23)
CO2: 30 mmol/L (ref 22–32)
Calcium: 8.6 mg/dL — ABNORMAL LOW (ref 8.9–10.3)
Chloride: 101 mmol/L (ref 98–111)
Creatinine, Ser: 0.82 mg/dL (ref 0.44–1.00)
GFR, Estimated: >60 mL/min (ref >60)
Glucose, Bld: 113 mg/dL — ABNORMAL HIGH (ref 70–99)
Potassium: 3.6 mmol/L (ref 3.5–5.1)
Sodium: 137 mmol/L (ref 135–145)
Total Bilirubin: 0.2 mg/dL — ABNORMAL LOW (ref 0.3–1.2)
Total Protein: 7.3 g/dL (ref 6.5–8.1)

## 2020-04-15 LAB — IRON AND TIBC
Iron: 54 ug/dL (ref 28–170)
Saturation Ratios: 21 % (ref 10.4–31.8)
TIBC: 260 ug/dL (ref 250–450)
UIBC: 206 ug/dL

## 2020-04-15 LAB — VITAMIN B12: Vitamin B-12: 639 pg/mL (ref 180–914)

## 2020-04-15 LAB — FERRITIN: Ferritin: 103 ng/mL (ref 11–307)

## 2020-04-18 ENCOUNTER — Inpatient Hospital Stay: Payer: Medicare PPO

## 2020-04-20 ENCOUNTER — Other Ambulatory Visit: Payer: Self-pay

## 2020-04-20 ENCOUNTER — Ambulatory Visit
Admission: RE | Admit: 2020-04-20 | Discharge: 2020-04-20 | Disposition: A | Discharge status: Home / Self Care | Payer: Medicare PPO | Source: Ambulatory Visit | Attending: Radiation Oncology | Admitting: Radiation Oncology

## 2020-04-20 ENCOUNTER — Inpatient Hospital Stay: Payer: Medicare PPO | Admitting: Oncology

## 2020-04-20 ENCOUNTER — Ambulatory Visit: Payer: Medicare PPO | Admitting: Oncology

## 2020-04-20 ENCOUNTER — Encounter: Payer: Self-pay | Admitting: Oncology

## 2020-04-20 ENCOUNTER — Other Ambulatory Visit: Payer: Medicare PPO

## 2020-04-20 DIAGNOSIS — C3490 Malignant neoplasm of unspecified part of unspecified bronchus or lung: Secondary | ICD-10-CM | POA: Diagnosis not present

## 2020-04-20 DIAGNOSIS — R918 Other nonspecific abnormal finding of lung field: Secondary | ICD-10-CM | POA: Diagnosis not present

## 2020-04-20 DIAGNOSIS — Z923 Personal history of irradiation: Secondary | ICD-10-CM | POA: Insufficient documentation

## 2020-04-20 DIAGNOSIS — C3432 Malignant neoplasm of lower lobe, left bronchus or lung: Secondary | ICD-10-CM | POA: Insufficient documentation

## 2020-04-20 DIAGNOSIS — R911 Solitary pulmonary nodule: Secondary | ICD-10-CM | POA: Diagnosis not present

## 2020-04-20 NOTE — Progress Notes (Signed)
Pt in for follow up, reports finishing up antibiotic, already finished prednisone.  Pt is anxious to get test results.

## 2020-04-20 NOTE — Progress Notes (Signed)
Radiation Oncology Follow up Note  Name: Elizabeth [name]   Date:   04/20/2020 MRN:  [id number] DOB: 11/30/1950    This 70 y.o. female presents to the clinic today for 10-month follow-up status post SBRT to her left lower lobe for stage I non-small cell lung cancer.  REFERRING PROVIDER: Baldo Ash*  HPI: Patient is a 70 year old female now about 10 months having completed SBRT to her left lower lobe for stage I non-small cell lung cancer.  Seen today in routine follow-up she is doing well.  She specifically denies decreased energy level cough dysphagia.  She had a recent CT scan which I have reviewed shows continued decrease in size of the left lower lobe pulmonary nodule greater than 75% decrease in size by 3-dimensional standards.  She also has additional nonspecific pulmonary nodules which are stable and normal certainly benign.  COMPLICATIONS OF TREATMENT: none  FOLLOW UP COMPLIANCE: keeps appointments   PHYSICAL EXAM:  There were no vitals taken for this visit. Well-developed well-nourished patient in NAD. HEENT reveals PERLA, EOMI, discs not visualized.  Oral cavity is clear. No oral mucosal lesions are identified. Neck is clear without evidence of cervical or supraclavicular adenopathy. Lungs are clear to A&P. Cardiac examination is essentially unremarkable with regular rate and rhythm without murmur rub or thrill. Abdomen is benign with no organomegaly or masses noted. Motor sensory and DTR levels are equal and symmetric in the upper and lower extremities. Cranial nerves II through XII are grossly intact. Proprioception is intact. No peripheral adenopathy or edema is identified. No motor or sensory levels are noted. Crude visual fields are within normal range.  RADIOLOGY RESULTS: CT scans reviewed compatible with above-stated findings  PLAN: Present time patient is doing well with excellent control by CT criteria of her left lower lobe stage I non-small cell lung cancer.  I  am pleased with her overall progress.  I have asked to see her back in 6 months with a follow-up CT scan at that time.  Patient knows to call with any concerns.  I would like to take this opportunity to thank you for allowing me to participate in the care of your patient.[name], MD

## 2020-04-20 NOTE — Progress Notes (Signed)
[place]  [phone] [phone]  ID: Elizabeth [name] OB: 28-Apr-1950  MR#: [id number]  HUT#:[id number]  Patient Care Team: Cecile Sheerer, MD as PCP - General (Family Medicine) Telford Nab, RN as Oncology Nurse Navigator [name], MD as Radiation Oncologist (Radiation Oncology)  CHIEF COMPLAINT: Probable stage Ia left lower lobe lung cancer, history of iron deficiency anemia.  INTERVAL HISTORY: Patient is a 70 year old female who requested transfer of physician for monitoring of her probable lung cancer.  Patient previously noted to have a left lower lobe lesion that was increasing in size.  Biopsy was determined to be 2 dangerous and patient under went SBRT in approximately May 2021.  She returns to clinic today for further evaluation and discussion of her imaging results.  She currently feels well and is asymptomatic.  She has no neurologic complaints.  She denies any recent fevers or illnesses.  She has a good appetite and denies weight loss.  She has no chest pain, shortness of breath, cough, or hemoptysis.  She denies any nausea, vomiting, constipation, or diarrhea.  She has no urinary complaints.  Patient offers no specific complaints today.  REVIEW OF SYSTEMS:   Review of Systems  Constitutional: Negative.  Negative for fever, malaise/fatigue and weight loss.  Respiratory: Negative.  Negative for cough, hemoptysis and shortness of breath.   Cardiovascular: Negative.  Negative for chest pain and leg swelling.  Gastrointestinal: Negative.  Negative for abdominal pain.  Genitourinary: Negative.  Negative for dysuria.  Musculoskeletal: Negative.  Negative for back pain.  Skin: Negative.  Negative for rash.  Neurological: Negative.  Negative for dizziness, focal weakness, weakness and headaches.  Psychiatric/Behavioral: Negative.  The patient is not nervous/anxious.     As per HPI. Otherwise, a complete review of systems is  negative.  PAST MEDICAL HISTORY: Past Medical History:  Diagnosis Date  . Allergy   . Anxiety   . B12 deficiency   . Carpal tunnel syndrome   . COPD (chronic obstructive pulmonary disease) ([place])   . Depression   . Genetic testing    Common Cancers panel (47 genes) @ [facility] - No pathogenic mutations detected  . Hyperlipidemia   . Hypertension   . Iron deficiency anemia 10/16/2016  . Lipoma of skin   . Normocytic anemia 10/16/2016  . QT prolongation   . Substance abuse ([place])     PAST SURGICAL HISTORY: Past Surgical History:  Procedure Laterality Date  . ABDOMINAL HYSTERECTOMY    . BREAST EXCISIONAL BIOPSY Left   . CESAREAN SECTION    . TUBAL LIGATION      FAMILY HISTORY: Family History  Problem Relation Age of Onset  . Ovarian cancer Mother 73       deceased 60  . Breast cancer Mother 3  . Stroke Father   . Lung cancer Paternal Aunt   . Lung cancer Cousin     ADVANCED DIRECTIVES (Y/N):  N  HEALTH MAINTENANCE: Social History   Tobacco Use  . Smoking status: Current Every Day Smoker    Packs/day: 1.00    Years: 52.00    Pack years: 52.00    Types: Cigarettes  . Smokeless tobacco: Never Used  . Tobacco comment: 5 ciggarette a day--03/02/2020  Vaping Use  . Vaping Use: Some days  Substance Use Topics  . Alcohol use: No  . Drug use: No     Colonoscopy:  PAP:  Bone density:  Lipid panel:  Allergies  Allergen Reactions  . Moxifloxacin  Itching, Other (See Comments), Photosensitivity, Rash and Swelling    Causes swelling, redness, burning in eyes   . Levofloxacin Other (See Comments)    Other reaction(s): Muscle Pain Other reaction(s): Muscle Pain  . Tetracycline Other (See Comments)    Current Outpatient Medications  Medication Sig Dispense Refill  . albuterol (PROVENTIL) (2.5 MG/3ML) 0.083% nebulizer solution Inhale into the lungs.    Marland Kitchen albuterol (VENTOLIN HFA) 108 (90 Base) MCG/ACT inhaler Frequency:Q4HPRN   Dosage:2   PUFFS  Instructions:   Note:Dose: 2 PUFFS    . amLODipine (NORVASC) 5 MG tablet Take 5 mg by mouth daily.    Marland Kitchen amoxicillin (AMOXIL) 875 MG tablet     . budesonide-formoterol (SYMBICORT) 160-4.5 MCG/ACT inhaler Inhale into the lungs.    . cyanocobalamin (,VITAMIN B-12,) 1000 MCG/ML injection     . fluticasone (FLONASE) 50 MCG/ACT nasal spray   1  . furosemide (LASIX) 20 MG tablet Take 20 mg by mouth daily.  0  . magnesium oxide (MAG-OX) 400 MG tablet Take by mouth.    . Multiple Vitamin (MULTI-VITAMINS) TABS Take by mouth.    Marland Kitchen omeprazole (PRILOSEC OTC) 20 MG tablet Take by mouth.    . potassium chloride SA (KLOR-CON) 20 MEQ tablet Take by mouth.    . simvastatin (ZOCOR) 80 MG tablet Take by mouth.    . tiotropium (SPIRIVA) 18 MCG inhalation capsule inhale contents of 1 capsule by mouth once daily    . Venlafaxine HCl 225 MG TB24 Take by mouth.     No current facility-administered medications for this visit.    OBJECTIVE: Vitals:   04/20/20 1024  BP: 137/70  Pulse: 92  Resp: 20  Temp: 98 F (36.7 C)  SpO2: 97%     Body mass index is 32.92 kg/m.    ECOG FS:0 - Asymptomatic  General: Well-developed, well-nourished, no acute distress. Eyes: Pink conjunctiva, anicteric sclera. HEENT: Normocephalic, moist mucous membranes. Lungs: No audible wheezing or coughing. Heart: Regular rate and rhythm. Abdomen: Soft, nontender, no obvious distention. Musculoskeletal: No edema, cyanosis, or clubbing. Neuro: Alert, answering all questions appropriately. Cranial nerves grossly intact. Skin: No rashes or petechiae noted. Psych: Normal affect. Lymphatics: No cervical, calvicular, axillary or inguinal LAD.   LAB RESULTS:  Lab Results  Component Value Date   NA 137 04/15/2020   K 3.6 04/15/2020   CL 101 04/15/2020   CO2 30 04/15/2020   GLUCOSE 113 (H) 04/15/2020   BUN 11 04/15/2020   CREATININE 0.82 04/15/2020   CALCIUM 8.6 (L) 04/15/2020   PROT 7.3 04/15/2020   ALBUMIN 4.1 04/15/2020   AST 26 04/15/2020    ALT 21 04/15/2020   ALKPHOS 94 04/15/2020   BILITOT 0.2 (L) 04/15/2020   GFRNONAA >60 04/15/2020   GFRAA >60 10/20/2019    Lab Results  Component Value Date   WBC 8.3 04/15/2020   NEUTROABS 5.0 04/15/2020   HGB 13.0 04/15/2020   HCT 38.6 04/15/2020   MCV 85.2 04/15/2020   PLT 289 04/15/2020     STUDIES: CT Chest W Contrast  Result Date: 04/13/2020 CLINICAL DATA:  Non-small cell lung cancer restaging, status post definitive SBRT, current smoker EXAM: CT CHEST WITH CONTRAST TECHNIQUE: Multidetector CT imaging of the chest was performed during intravenous contrast administration. CONTRAST:  33mL OMNIPAQUE IOHEXOL 300 MG/ML  SOLN COMPARISON:  10/21/2019 FINDINGS: Cardiovascular: Aortic atherosclerosis. Normal heart size. Left coronary artery calcifications. No pericardial effusion. Mediastinum/Nodes: No enlarged mediastinal, hilar, or axillary lymph nodes. Thyroid gland, trachea, and  esophagus demonstrate no significant findings. Lungs/Pleura: Continued interval decrease in size of a left lower lobe pulmonary nodule, measuring 0.4 x 0.4 cm, previously 0.7 x 0.6 cm, now with some adjacent subtle ground-glass opacity (series 3, image 70). Additional small pulmonary nodules are stable, for example a 3 mm nodule abutting the fissure in the posterior right upper lobe (series 3, image 38) and a 5 mm fissural nodule of the extreme superior segment right lower lobe (series 3, image 31). There is a background of very tiny centrilobular pulmonary nodules and scattered ground-glass opacities most concentrated in the lung apices (series 3, image 26). No pleural effusion or pneumothorax. Upper Abdomen: No acute abnormality.  Hepatic steatosis. Musculoskeletal: No chest wall mass or suspicious bone lesions identified. IMPRESSION: 1. Continued interval decrease in size of a left lower lobe pulmonary nodule, measuring 0.4 x 0.4 cm, previously 0.7 x 0.6 cm, now with some adjacent subtle ground-glass opacity.  Findings are consistent with treatment response to SBRT. 2. Additional small nonspecific pulmonary nodules are stable, almost certainly incidental and benign. Attention on follow-up. 3. There is a background of very tiny centrilobular pulmonary nodules and scattered ground-glass opacities most concentrated in the lung apices, nonspecific and infectious or inflammatory, likely smoking-related respiratory bronchiolitis. 4. Coronary artery disease. 5. Hepatic steatosis. Aortic Atherosclerosis (ICD10-I70.0). Electronically Signed   By: Eddie Candle M.D.   On: 04/13/2020 13:51    ASSESSMENT: Probable stage Ia left lower lobe lung cancer, history of iron deficiency anemia.  PLAN:    1.  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  She completed treatment in approximately May 2021.  Restaging CT scan on April 13, 2020 reviewed independently and reported as above with interval decrease in size of left lower lobe pulmonary nodule.  No intervention is needed.  We will continue routine CT scan surveillance every 6 months until patient is approximately 2 years removed from her treatments at which time she can be transitioned to yearly imaging and evaluation.  Return to clinic in 6 months with repeat CT scan and further evaluation. 2.  History of iron deficiency anemia: Resolved.  Patient's hemoglobin, iron stores, B12 levels are all within normal limits.  She has been instructed to continue her oral iron supplementation.  Patient last received IV Feraheme on October 26, 2016.  I spent a total of 30 minutes reviewing chart data, face-to-face evaluation with the patient, counseling and coordination of care as detailed above.   Lloyd Huger, MD   04/20/2020 11:25 AM

## 2020-07-04 ENCOUNTER — Telehealth: Payer: Self-pay | Admitting: Internal Medicine

## 2020-07-04 DIAGNOSIS — G4733 Obstructive sleep apnea (adult) (pediatric): Secondary | ICD-10-CM

## 2020-07-04 NOTE — Telephone Encounter (Signed)
Called and spoke to patient, who is requesting refill on cpap supplies.  Our office did not originally prescribe cpap.  Dr. Mortimer Fries, please advise. Thanks.

## 2020-07-06 NOTE — Telephone Encounter (Signed)
Please Ask patient find out who ordered.Marland KitchenMarland KitchenMarland Kitchen

## 2020-07-07 NOTE — Telephone Encounter (Signed)
cpap supplies has been ordered as requested.  Patient is aware and voiced her understanding. northing further is needed at this time.

## 2020-07-07 NOTE — Telephone Encounter (Signed)
Spoke to patient, who stated that [facility] originally prescribed cpap.  She would like Dr. Mortimer Fries to manage cpap.  Dr. Mortimer Fries, please advise.

## 2020-07-07 NOTE — Telephone Encounter (Signed)
Please refill CPAP supplies as requested

## 2020-07-28 ENCOUNTER — Telehealth: Payer: Self-pay | Admitting: Internal Medicine

## 2020-07-28 DIAGNOSIS — D509 Iron deficiency anemia, unspecified: Secondary | ICD-10-CM

## 2020-07-28 MED ORDER — ALBUTEROL SULFATE HFA 108 (90 BASE) MCG/ACT IN AERS: 2.0000 | INHALATION_SPRAY | Freq: Four times a day (QID) | RESPIRATORY_TRACT | 5 refills | Status: DC | PRN

## 2020-07-28 MED ORDER — ALBUTEROL SULFATE (2.5 MG/3ML) 0.083% IN NEBU: 2.5000 mg | INHALATION_SOLUTION | Freq: Four times a day (QID) | RESPIRATORY_TRACT | 5 refills | Status: DC | PRN

## 2020-07-28 NOTE — Telephone Encounter (Signed)
Rx for pt's albuterol inhaler and neb sol have been sent to preferred pharmacy for pt. Called and spoke with pt's daughter Larene Beach letting her know that this had been done and stated to her to make sure pt kept upcoming appt. Larene Beach verbalized understanding and stated that pt will keep her upcoming appt. Nothing further needed.

## 2020-08-10 ENCOUNTER — Telehealth: Payer: Self-pay | Admitting: *Deleted

## 2020-08-10 NOTE — Telephone Encounter (Signed)
Pt called in to report is having increased pain and swelling around lipoma on her back. Pt is concerned that this could be cancer and would like to come in for evaluation if possible instead of waiting until July when her next appt is scheduled.   Please advise.

## 2020-08-11 NOTE — Telephone Encounter (Signed)
Pt's daughter has been made aware of scheduled appt.

## 2020-08-13 NOTE — Progress Notes (Signed)
[place]  [phone]) [phone] Fax:(336) [phone]  ID: Sheniah Supak OB: Jun 11, 1950  MR#: [id number]  ALP#:[id number]  Patient Care Team: Cecile Sheerer, MD as PCP - General (Family Medicine) Telford Nab, RN as Oncology Nurse Navigator [name], MD as Radiation Oncologist (Radiation Oncology)  CHIEF COMPLAINT: Probable stage Ia left lower lobe lung cancer, history of iron deficiency anemia.  INTERVAL HISTORY: Patient returns to clinic as an add-on with complaints of a lipoma.  She states it is intermittently tender and has enlarged recently.  She otherwise feels well.  She continues to be anxious.  She has no neurologic complaints.  She denies any recent fevers or illnesses.  She has a good appetite and denies weight loss.  She has no chest pain, shortness of breath, cough, or hemoptysis.  She denies any nausea, vomiting, constipation, or diarrhea.  She has no urinary complaints.  Patient offers no further specific complaints today.  REVIEW OF SYSTEMS:   Review of Systems  Constitutional: Negative.  Negative for fever, malaise/fatigue and weight loss.  Respiratory: Negative.  Negative for cough, hemoptysis and shortness of breath.   Cardiovascular: Negative.  Negative for chest pain and leg swelling.  Gastrointestinal: Negative.  Negative for abdominal pain.  Genitourinary: Negative.  Negative for dysuria.  Musculoskeletal: Negative.  Negative for back pain.  Skin: Negative.  Negative for rash.  Neurological: Negative.  Negative for dizziness, focal weakness, weakness and headaches.  Psychiatric/Behavioral: The patient is nervous/anxious.     As per HPI. Otherwise, a complete review of systems is negative.  PAST MEDICAL HISTORY: Past Medical History:  Diagnosis Date  . Allergy   . Anxiety   . B12 deficiency   . Carpal tunnel syndrome   . COPD (chronic obstructive pulmonary disease) ([place])   . Depression   . Genetic testing     Common Cancers panel (47 genes) @ [facility] - No pathogenic mutations detected  . Hyperlipidemia   . Hypertension   . Iron deficiency anemia 10/16/2016  . Lipoma of skin   . Normocytic anemia 10/16/2016  . QT prolongation   . Substance abuse ([place])     PAST SURGICAL HISTORY: Past Surgical History:  Procedure Laterality Date  . ABDOMINAL HYSTERECTOMY    . BREAST EXCISIONAL BIOPSY Left   . CESAREAN SECTION    . TUBAL LIGATION      FAMILY HISTORY: Family History  Problem Relation Age of Onset  . Ovarian cancer Mother 80       deceased 60  . Breast cancer Mother 15  . Stroke Father   . Lung cancer Paternal Aunt   . Lung cancer Cousin     ADVANCED DIRECTIVES (Y/N):  N  HEALTH MAINTENANCE: Social History   Tobacco Use  . Smoking status: Current Every Day Smoker    Packs/day: 1.00    Years: 52.00    Pack years: 52.00    Types: Cigarettes  . Smokeless tobacco: Never Used  . Tobacco comment: 5 ciggarette a day--03/02/2020  Vaping Use  . Vaping Use: Some days  Substance Use Topics  . Alcohol use: No  . Drug use: No     Colonoscopy:  PAP:  Bone density:  Lipid panel:  Allergies  Allergen Reactions  . Moxifloxacin Itching, Other (See Comments), Photosensitivity, Rash and Swelling    Causes swelling, redness, burning in eyes   . Levofloxacin Other (See Comments)    Other reaction(s): Muscle Pain Other reaction(s): Muscle Pain  . Tetracycline Other (See Comments)  Current Outpatient Medications  Medication Sig Dispense Refill  . albuterol (PROVENTIL) (2.5 MG/3ML) 0.083% nebulizer solution Take 3 mLs (2.5 mg total) by nebulization every 6 (six) hours as needed for wheezing or shortness of breath. 75 mL 5  . albuterol (VENTOLIN HFA) 108 (90 Base) MCG/ACT inhaler Inhale 2 puffs into the lungs every 6 (six) hours as needed for wheezing or shortness of breath. 8 g 5  . amLODipine (NORVASC) 5 MG tablet Take 5 mg by mouth daily.    . budesonide-formoterol (SYMBICORT)  160-4.5 MCG/ACT inhaler Inhale into the lungs.    . cyanocobalamin (,VITAMIN B-12,) 1000 MCG/ML injection     . fluticasone (FLONASE) 50 MCG/ACT nasal spray   1  . furosemide (LASIX) 20 MG tablet Take 20 mg by mouth daily.  0  . magnesium oxide (MAG-OX) 400 MG tablet Take by mouth.    . Multiple Vitamin (MULTI-VITAMINS) TABS Take by mouth.    Marland Kitchen omeprazole (PRILOSEC OTC) 20 MG tablet Take by mouth.    . simvastatin (ZOCOR) 80 MG tablet Take by mouth.    . tiotropium (SPIRIVA) 18 MCG inhalation capsule inhale contents of 1 capsule by mouth once daily    . Venlafaxine HCl 225 MG TB24 Take by mouth.    Marland Kitchen amoxicillin (AMOXIL) 875 MG tablet      No current facility-administered medications for this visit.    OBJECTIVE: Vitals:   08/17/20 1046  BP: (!) 148/61  Pulse: (!) 101  Resp: 17  Temp: 97.6 F (36.4 C)  SpO2: 97%     Body mass index is 33.47 kg/m.    ECOG FS:0 - Asymptomatic  General: Well-developed, well-nourished, no acute distress. Eyes: Pink conjunctiva, anicteric sclera. HEENT: Normocephalic, moist mucous membranes. Lungs: No audible wheezing or coughing. Heart: Regular rate and rhythm. Abdomen: Soft, nontender, no obvious distention. Musculoskeletal: No edema, cyanosis, or clubbing. Neuro: Alert, answering all questions appropriately. Cranial nerves grossly intact. Skin: No rashes or petechiae noted.  Large lipoma noted over left scapula.  No erythema or tenderness. Psych: Normal affect.   LAB RESULTS:  Lab Results  Component Value Date   NA 137 04/15/2020   K 3.6 04/15/2020   CL 101 04/15/2020   CO2 30 04/15/2020   GLUCOSE 113 (H) 04/15/2020   BUN 11 04/15/2020   CREATININE 0.82 04/15/2020   CALCIUM 8.6 (L) 04/15/2020   PROT 7.3 04/15/2020   ALBUMIN 4.1 04/15/2020   AST 26 04/15/2020   ALT 21 04/15/2020   ALKPHOS 94 04/15/2020   BILITOT 0.2 (L) 04/15/2020   GFRNONAA >60 04/15/2020   GFRAA >60 10/20/2019    Lab Results  Component Value Date   WBC  8.3 04/15/2020   NEUTROABS 5.0 04/15/2020   HGB 13.0 04/15/2020   HCT 38.6 04/15/2020   MCV 85.2 04/15/2020   PLT 289 04/15/2020     STUDIES: No results found.  ASSESSMENT: Probable stage Ia left lower lobe lung cancer, history of iron deficiency anemia.  PLAN:    1.  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  She completed treatment in approximately May 2021.  Restaging CT scan on April 13, 2020 reviewed independently and reported as above with interval decrease in size of left lower lobe pulmonary nodule.  No intervention is needed.  We will continue routine CT scan surveillance every 6 months until patient is approximately 2 years removed from her treatments at which time she can be  transitioned to yearly imaging and evaluation.  Patient has been instructed to keep her previously scheduled follow-up appointments for imaging and evaluation. 2.  History of iron deficiency anemia: Resolved.  Patient's hemoglobin, iron stores, B12 levels are all within normal limits.  She has been instructed to continue her oral iron supplementation.  Patient last received IV Feraheme on October 26, 2016. 3.  Lipoma: Benign.  No intervention is needed.  Patient has been instructed to follow-up with primary care if she has any further questions or concerns.  I spent a total of 20 minutes reviewing chart data, face-to-face evaluation with the patient, counseling and coordination of care as detailed above.   Lloyd Huger, MD   08/17/2020 11:15 AM

## 2020-08-17 ENCOUNTER — Inpatient Hospital Stay: Payer: Medicare PPO | Attending: Oncology | Admitting: Oncology

## 2020-08-17 ENCOUNTER — Other Ambulatory Visit: Payer: Self-pay

## 2020-08-17 ENCOUNTER — Encounter: Payer: Self-pay | Admitting: Oncology

## 2020-08-17 VITALS — BP 148/61 | HR 101 | Temp 97.6°F | Resp 17 | Ht 62.0 in | Wt 183.0 lb

## 2020-08-17 DIAGNOSIS — F17210 Nicotine dependence, cigarettes, uncomplicated: Secondary | ICD-10-CM | POA: Diagnosis not present

## 2020-08-17 DIAGNOSIS — C3490 Malignant neoplasm of unspecified part of unspecified bronchus or lung: Secondary | ICD-10-CM

## 2020-08-17 DIAGNOSIS — R918 Other nonspecific abnormal finding of lung field: Secondary | ICD-10-CM | POA: Diagnosis present

## 2020-08-17 DIAGNOSIS — D509 Iron deficiency anemia, unspecified: Secondary | ICD-10-CM | POA: Diagnosis not present

## 2020-08-17 DIAGNOSIS — Z79899 Other long term (current) drug therapy: Secondary | ICD-10-CM | POA: Insufficient documentation

## 2020-08-17 DIAGNOSIS — Z7951 Long term (current) use of inhaled steroids: Secondary | ICD-10-CM | POA: Diagnosis not present

## 2020-09-20 ENCOUNTER — Ambulatory Visit: Payer: Medicare PPO | Admitting: Internal Medicine

## 2020-10-13 ENCOUNTER — Encounter: Payer: Self-pay | Admitting: Oncology

## 2020-10-17 ENCOUNTER — Other Ambulatory Visit: Payer: Self-pay

## 2020-10-17 DIAGNOSIS — D509 Iron deficiency anemia, unspecified: Secondary | ICD-10-CM

## 2020-10-18 ENCOUNTER — Other Ambulatory Visit: Payer: Self-pay

## 2020-10-18 ENCOUNTER — Ambulatory Visit
Admission: RE | Admit: 2020-10-18 | Discharge: 2020-10-18 | Disposition: A | Discharge status: Home / Self Care | Payer: Medicare PPO | Source: Ambulatory Visit | Attending: Oncology | Admitting: Oncology

## 2020-10-18 ENCOUNTER — Inpatient Hospital Stay: Payer: Medicare PPO | Attending: Oncology

## 2020-10-18 DIAGNOSIS — F17210 Nicotine dependence, cigarettes, uncomplicated: Secondary | ICD-10-CM | POA: Insufficient documentation

## 2020-10-18 DIAGNOSIS — C3432 Malignant neoplasm of lower lobe, left bronchus or lung: Secondary | ICD-10-CM | POA: Insufficient documentation

## 2020-10-18 DIAGNOSIS — Z79899 Other long term (current) drug therapy: Secondary | ICD-10-CM | POA: Insufficient documentation

## 2020-10-18 DIAGNOSIS — D509 Iron deficiency anemia, unspecified: Secondary | ICD-10-CM

## 2020-10-18 DIAGNOSIS — C3490 Malignant neoplasm of unspecified part of unspecified bronchus or lung: Secondary | ICD-10-CM | POA: Insufficient documentation

## 2020-10-18 LAB — IRON AND TIBC
Iron: 81 ug/dL (ref 28–170)
Saturation Ratios: 28 % (ref 10.4–31.8)
TIBC: 290 ug/dL (ref 250–450)
UIBC: 209 ug/dL

## 2020-10-18 LAB — COMPREHENSIVE METABOLIC PANEL
ALT: 29 U/L (ref 0–44)
AST: 30 U/L (ref 15–41)
Albumin: 3.8 g/dL (ref 3.5–5.0)
Alkaline Phosphatase: 84 U/L (ref 38–126)
Anion gap: 10 (ref 5–15)
BUN: 10 mg/dL (ref 8–23)
CO2: 27 mmol/L (ref 22–32)
Calcium: 9 mg/dL (ref 8.9–10.3)
Chloride: 101 mmol/L (ref 98–111)
Creatinine, Ser: 0.7 mg/dL (ref 0.44–1.00)
GFR, Estimated: >60 mL/min (ref >60)
Glucose, Bld: 121 mg/dL — ABNORMAL HIGH (ref 70–99)
Potassium: 3.9 mmol/L (ref 3.5–5.1)
Sodium: 138 mmol/L (ref 135–145)
Total Bilirubin: 0.7 mg/dL (ref 0.3–1.2)
Total Protein: 7.1 g/dL (ref 6.5–8.1)

## 2020-10-18 LAB — CBC WITH DIFFERENTIAL/PLATELET
Abs Immature Granulocytes: 0.03 10*3/uL (ref 0.00–0.07)
Basophils Absolute: 0 10*3/uL (ref 0.0–0.1)
Basophils Relative: 0 %
Eosinophils Absolute: 0 10*3/uL (ref 0.0–0.5)
Eosinophils Relative: 0 %
HCT: 39 % (ref 36.0–46.0)
Hemoglobin: 12.7 g/dL (ref 12.0–15.0)
Immature Granulocytes: 0 %
Lymphocytes Relative: 26 %
Lymphs Abs: 1.8 10*3/uL (ref 0.7–4.0)
MCH: 27.9 pg (ref 26.0–34.0)
MCHC: 32.6 g/dL (ref 30.0–36.0)
MCV: 85.5 fL (ref 80.0–100.0)
Monocytes Absolute: 0.7 10*3/uL (ref 0.1–1.0)
Monocytes Relative: 10 %
Neutro Abs: 4.5 10*3/uL (ref 1.7–7.7)
Neutrophils Relative %: 64 %
Platelets: 230 10*3/uL (ref 150–400)
RBC: 4.56 MIL/uL (ref 3.87–5.11)
RDW: 13.6 % (ref 11.5–15.5)
WBC: 7.1 10*3/uL (ref 4.0–10.5)
nRBC: 0 % (ref 0.0–0.2)

## 2020-10-18 LAB — FERRITIN: Ferritin: 127 ng/mL (ref 11–307)

## 2020-10-18 LAB — POCT I-STAT CREATININE: Creatinine, Ser: 0.7 mg/dL (ref 0.44–1.00)

## 2020-10-18 MED ORDER — IOHEXOL 300 MG/ML  SOLN
75.0000 mL | Freq: Once | INTRAMUSCULAR | Status: AC | PRN
  Administered 2020-10-18: 75 mL via INTRAVENOUS

## 2020-10-18 NOTE — Progress Notes (Signed)
[place]  Telephone:(336) [phone] Fax:(336) [phone]  ID: Elizabeth [name] OB: 10-08-1950  MR#: [id number]  GUR#:[id number]  Patient Care Team: Cecile Sheerer, MD as PCP - General (Family Medicine) Telford Nab, RN as Oncology Nurse Navigator [name], MD as Radiation Oncologist (Radiation Oncology)  CHIEF COMPLAINT: Probable stage Ia left lower lobe lung cancer, history of iron deficiency anemia.  INTERVAL HISTORY: Patient returns to clinic today for further evaluation and discussion of her imaging results.  She currently feels well and is asymptomatic.  She has no neurologic complaints.  She denies any recent fevers or illnesses.  She has a good appetite and denies weight loss.  She has no chest pain, shortness of breath, cough, or hemoptysis.  She denies any nausea, vomiting, constipation, or diarrhea.  She has no urinary complaints.  Patient offers no specific complaints today.  REVIEW OF SYSTEMS:   Review of Systems  Constitutional: Negative.  Negative for fever, malaise/fatigue and weight loss.  Respiratory: Negative.  Negative for cough, hemoptysis and shortness of breath.   Cardiovascular: Negative.  Negative for chest pain and leg swelling.  Gastrointestinal: Negative.  Negative for abdominal pain.  Genitourinary: Negative.  Negative for dysuria.  Musculoskeletal: Negative.  Negative for back pain.  Skin: Negative.  Negative for rash.  Neurological: Negative.  Negative for dizziness, focal weakness, weakness and headaches.  Psychiatric/Behavioral:  The patient is nervous/anxious.    As per HPI. Otherwise, a complete review of systems is negative.  PAST MEDICAL HISTORY: Past Medical History:  Diagnosis Date   Allergy    Anxiety    B12 deficiency    Carpal tunnel syndrome    COPD (chronic obstructive pulmonary disease) ([facility])    Depression    Genetic testing    Common Cancers panel (55 genes) @ [facility] - No pathogenic mutations  detected   Hyperlipidemia    Hypertension    Iron deficiency anemia 10/16/2016   Lipoma of skin    Normocytic anemia 10/16/2016   QT prolongation    Substance abuse ([place])     PAST SURGICAL HISTORY: Past Surgical History:  Procedure Laterality Date   ABDOMINAL HYSTERECTOMY     BREAST EXCISIONAL BIOPSY Left    CESAREAN SECTION     TUBAL LIGATION      FAMILY HISTORY: Family History  Problem Relation Age of Onset   Ovarian cancer Mother 10       deceased 29   Breast cancer Mother 39   Stroke Father    Lung cancer Paternal Aunt    Lung cancer Cousin     ADVANCED DIRECTIVES (Y/N):  N  HEALTH MAINTENANCE: Social History   Tobacco Use   Smoking status: Every Day    Packs/day: 1.00    Years: 52.00    Pack years: 52.00    Types: Cigarettes   Smokeless tobacco: Never   Tobacco comments:    5 ciggarette a day--03/02/2020  Vaping Use   Vaping Use: Some days  Substance Use Topics   Alcohol use: No   Drug use: No     Colonoscopy:  PAP:  Bone density:  Lipid panel:  Allergies  Allergen Reactions   Moxifloxacin Itching, Other (See Comments), Photosensitivity, Rash and Swelling    Causes swelling, redness, burning in eyes    Levofloxacin Other (See Comments)    Other reaction(s): Muscle Pain Other reaction(s): Muscle Pain   Tetracycline Other (See Comments)    Current Outpatient Medications  Medication Sig Dispense Refill  albuterol (PROVENTIL) (2.5 MG/3ML) 0.083% nebulizer solution Take 3 mLs (2.5 mg total) by nebulization every 6 (six) hours as needed for wheezing or shortness of breath. 75 mL 5   albuterol (VENTOLIN HFA) 108 (90 Base) MCG/ACT inhaler Inhale 2 puffs into the lungs every 6 (six) hours as needed for wheezing or shortness of breath. 8 g 5   amLODipine (NORVASC) 5 MG tablet Take 5 mg by mouth daily.     ARIPiprazole (ABILIFY) 2 MG tablet      budesonide-formoterol (SYMBICORT) 160-4.5 MCG/ACT inhaler Inhale into the lungs.     cyanocobalamin  (,VITAMIN B-12,) 1000 MCG/ML injection      fluticasone (FLONASE) 50 MCG/ACT nasal spray   1   furosemide (LASIX) 20 MG tablet Take 20 mg by mouth daily.  0   magnesium oxide (MAG-OX) 400 MG tablet Take by mouth.     Multiple Vitamin (MULTI-VITAMINS) TABS Take by mouth.     omeprazole (PRILOSEC OTC) 20 MG tablet Take by mouth.     simvastatin (ZOCOR) 80 MG tablet Take by mouth.     tiotropium (SPIRIVA) 18 MCG inhalation capsule inhale contents of 1 capsule by mouth once daily     No current facility-administered medications for this visit.    OBJECTIVE: There were no vitals filed for this visit.    There is no height or weight on file to calculate BMI.    ECOG FS:0 - Asymptomatic  General: Well-developed, well-nourished, no acute distress. Eyes: Pink conjunctiva, anicteric sclera. HEENT: Normocephalic, moist mucous membranes. Lungs: No audible wheezing or coughing. Heart: Regular rate and rhythm. Abdomen: Soft, nontender, no obvious distention. Musculoskeletal: No edema, cyanosis, or clubbing. Neuro: Alert, answering all questions appropriately. Cranial nerves grossly intact. Skin: No rashes or petechiae noted. Psych: Normal affect.   LAB RESULTS:  Lab Results  Component Value Date   NA 138 10/18/2020   K 3.9 10/18/2020   CL 101 10/18/2020   CO2 27 10/18/2020   GLUCOSE 121 (H) 10/18/2020   BUN 10 10/18/2020   CREATININE 0.70 10/18/2020   CALCIUM 9.0 10/18/2020   PROT 7.1 10/18/2020   ALBUMIN 3.8 10/18/2020   AST 30 10/18/2020   ALT 29 10/18/2020   ALKPHOS 84 10/18/2020   BILITOT 0.7 10/18/2020   GFRNONAA >60 10/18/2020   GFRAA >60 10/20/2019    Lab Results  Component Value Date   WBC 7.1 10/18/2020   NEUTROABS 4.5 10/18/2020   HGB 12.7 10/18/2020   HCT 39.0 10/18/2020   MCV 85.5 10/18/2020   PLT 230 10/18/2020     STUDIES: CT Chest W Contrast  Result Date: 10/19/2020 CLINICAL DATA:  70 year old female with history of non-small cell lung cancer. Staging  examination. EXAM: CT CHEST WITH CONTRAST TECHNIQUE: Multidetector CT imaging of the chest was performed during intravenous contrast administration. CONTRAST:  68mL OMNIPAQUE IOHEXOL 300 MG/ML  SOLN COMPARISON:  Chest CT 04/13/2020. FINDINGS: Cardiovascular: Heart size is normal. There is no significant pericardial fluid, thickening or pericardial calcification. There is aortic atherosclerosis, as well as atherosclerosis of the great vessels of the mediastinum and the coronary arteries, including calcified atherosclerotic plaque in the left anterior descending coronary artery. Mediastinum/Nodes: No pathologically enlarged mediastinal or hilar lymph nodes. Please note that accurate exclusion of hilar adenopathy is limited on noncontrast CT scans. Esophagus is unremarkable in appearance. No axillary lymphadenopathy. Lungs/Pleura: Previously treated left lower lobe pulmonary nodule is stable in size (axial image 68 of series 3) measuring 4 x 4 mm on today's examination  with a small amount of surrounding ground-glass attenuation and architectural distortion, slightly increased compared to the prior examination, most compatible with evolving postradiation changes. Several other scattered tiny 2-4 mm pulmonary nodules are noted elsewhere in the lungs, stable compared to prior examinations, considered benign. No other new suspicious appearing pulmonary nodules or masses are noted. No acute consolidative airspace disease. No pleural effusions. Mild diffuse bronchial wall thickening with very mild centrilobular and paraseptal emphysema. Upper Abdomen: Aortic atherosclerosis. Diffuse low attenuation throughout the visualized hepatic parenchyma, indicative of severe hepatic steatosis. Musculoskeletal: There are no aggressive appearing lytic or blastic lesions noted in the visualized portions of the skeleton. IMPRESSION: 1. Treated left lower lobe pulmonary nodule is stable in size measuring 4 mm on today's examination with  evolving postradiation changes in the surrounding lung. 2. Other previously noted small pulmonary nodules are all stable in size, favored to be benign. 3. Mild diffuse bronchial wall thickening with very mild centrilobular and paraseptal emphysema; imaging findings suggestive of underlying COPD. 4. Aortic atherosclerosis, in addition to left anterior descending coronary artery disease. Assessment for potential risk factor modification, dietary therapy or pharmacologic therapy may be warranted, if clinically indicated. 5. Hepatic steatosis. Aortic Atherosclerosis (ICD10-I70.0) and Emphysema (ICD10-J43.9). Electronically Signed   By: Vinnie Langton M.D.   On: 10/19/2020 07:06    ASSESSMENT: Probable stage Ia left lower lobe lung cancer, history of iron deficiency anemia.  PLAN:    1.  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  She completed treatment in approximately May 2021.  Her most recent imaging with CT scan on October 19, 2020 reviewed independently and reported as above with no obvious evidence of recurrent or progressive disease.  No further interventions are needed at this time.  Continue routine CT scan surveillance every 6 months until patient is approximately 2 years removed from her treatments at which time she can be transitioned to yearly imaging and evaluation.  Return to clinic in 6 months with repeat imaging and further evaluation. 2.  History of iron deficiency anemia: Resolved.  Patient's hemoglobin, iron stores, B12 levels continue to be within normal limits.  She has been instructed to continue her oral iron supplementation.  Patient last received IV Feraheme on October 26, 2016. 3.  Lipoma: Benign.  No intervention is needed.  Patient has been instructed to follow-up with primary care if she has any further questions or concerns.  I spent a total of 20 minutes reviewing chart data, face-to-face evaluation with the  patient, counseling and coordination of care as detailed above.    Lloyd Huger, MD   10/24/2020 7:50 AM

## 2020-10-19 LAB — VITAMIN B12: Vitamin B-12: 968 pg/mL — ABNORMAL HIGH (ref 180–914)

## 2020-10-20 ENCOUNTER — Inpatient Hospital Stay: Payer: Medicare PPO | Admitting: Oncology

## 2020-10-20 ENCOUNTER — Encounter: Payer: Self-pay | Admitting: Radiation Oncology

## 2020-10-20 ENCOUNTER — Ambulatory Visit
Admission: RE | Admit: 2020-10-20 | Discharge: 2020-10-20 | Disposition: A | Discharge status: Home / Self Care | Payer: Medicare PPO | Source: Ambulatory Visit | Attending: Radiation Oncology | Admitting: Radiation Oncology

## 2020-10-20 ENCOUNTER — Other Ambulatory Visit: Payer: Self-pay

## 2020-10-20 VITALS — BP 152/71 | HR 96 | Resp 20 | Ht 62.0 in | Wt 189.6 lb

## 2020-10-20 DIAGNOSIS — C3412 Malignant neoplasm of upper lobe, left bronchus or lung: Secondary | ICD-10-CM | POA: Insufficient documentation

## 2020-10-20 DIAGNOSIS — C3490 Malignant neoplasm of unspecified part of unspecified bronchus or lung: Secondary | ICD-10-CM

## 2020-10-20 DIAGNOSIS — D509 Iron deficiency anemia, unspecified: Secondary | ICD-10-CM

## 2020-10-20 DIAGNOSIS — C3432 Malignant neoplasm of lower lobe, left bronchus or lung: Secondary | ICD-10-CM | POA: Diagnosis not present

## 2020-10-20 NOTE — Progress Notes (Signed)
Pt in for follow up and test results.  Seen by Dr Baruch Gouty in radiation oncology today as well

## 2020-10-20 NOTE — Progress Notes (Signed)
Radiation Oncology Follow up Note  Name: Elizabeth [name]   Date:   10/20/2020 MRN:  [id number] DOB: 27-Feb-1951    This 70 y.o. female presents to the clinic today for 38-month follow-up status post SBRT to her left upper lower lobe for stage I non-small cell lung cancer.  REFERRING PROVIDER: Baldo Ash*  HPI: Patient is a 70 year old female now seen out 16 months having completed SBRT to her left lower lobe for stage I non-small cell lung cancer.  Seen today in routine follow-up she is doing well she specifically [place] cough hemoptysis or chest tightness she states her pulmonary status is poor although she is not on oxygen at this time.  She had a recent CT scan.  Which I have reviewed showing treated left lower lobe nodule stable in size measuring 4 mm today.  There are other previous noted small pulmonary nodules which are stable favored to be benign.  She does have hepatic steatosis which I have assured her is benign and she will follow-up with medical oncology on that.  COMPLICATIONS OF TREATMENT: none  FOLLOW UP COMPLIANCE: keeps appointments   PHYSICAL EXAM:  BP (!) 152/71   Pulse 96   Resp 20   Ht 5\' 2"  (1.575 m)   Wt 189 lb 9.6 oz (86 kg)   SpO2 96%   BMI 34.68 kg/m  Well-developed well-nourished patient in NAD. HEENT reveals PERLA, EOMI, discs not visualized.  Oral cavity is clear. No oral mucosal lesions are identified. Neck is clear without evidence of cervical or supraclavicular adenopathy. Lungs are clear to A&P. Cardiac examination is essentially unremarkable with regular rate and rhythm without murmur rub or thrill. Abdomen is benign with no organomegaly or masses noted. Motor sensory and DTR levels are equal and symmetric in the upper and lower extremities. Cranial nerves II through XII are grossly intact. Proprioception is intact. No peripheral adenopathy or edema is identified. No motor or sensory levels are noted. Crude visual fields are within normal  range.  RADIOLOGY RESULTS: CT scans reviewed compatible with above-stated findings  PLAN: Present time patient is doing well very low side effect profile from her previous SBRT.  I have asked to see her back in 6 months for follow-up.  I will then start once year follow-up visits.  Patient knows to call with any concerns.  I would like to take this opportunity to thank you for allowing me to participate in the care of your patient.[name], MD

## 2020-10-24 ENCOUNTER — Encounter: Payer: Self-pay | Admitting: Oncology

## 2020-10-25 ENCOUNTER — Ambulatory Visit: Payer: Medicare PPO | Admitting: Adult Health

## 2020-11-28 ENCOUNTER — Other Ambulatory Visit: Payer: Self-pay | Admitting: Internal Medicine

## 2020-11-28 DIAGNOSIS — D509 Iron deficiency anemia, unspecified: Secondary | ICD-10-CM

## 2021-01-23 ENCOUNTER — Telehealth: Payer: Self-pay | Admitting: *Deleted

## 2021-01-23 NOTE — Telephone Encounter (Signed)
Pt transferring her care to [facility] and requests to cancel her appts on 04/21/21 and 04/26/21. Pt informed that appts will be cancelled and to call back if has any future needs. Pt verbalized understanding.

## 2021-04-21 ENCOUNTER — Other Ambulatory Visit: Payer: Medicare PPO

## 2021-04-21 ENCOUNTER — Ambulatory Visit: Payer: Medicare PPO

## 2021-04-26 ENCOUNTER — Ambulatory Visit: Payer: Medicare PPO | Admitting: Oncology

## 2021-04-26 ENCOUNTER — Ambulatory Visit: Payer: Medicare PPO | Admitting: Radiation Oncology

## 2021-06-12 ENCOUNTER — Other Ambulatory Visit: Payer: Self-pay

## 2021-06-12 ENCOUNTER — Encounter: Payer: Medicare PPO | Attending: Pulmonary Disease | Admitting: *Deleted

## 2021-06-12 ENCOUNTER — Encounter: Payer: Self-pay | Admitting: *Deleted

## 2021-06-12 DIAGNOSIS — J449 Chronic obstructive pulmonary disease, unspecified: Secondary | ICD-10-CM

## 2021-06-12 NOTE — Progress Notes (Signed)
Initial telephone orientation completed. Diagnosis can be found in [facility] 12/6. EP orientation scheduled for Monday 3/23 at 9:30.  ?

## 2021-07-04 ENCOUNTER — Encounter: Payer: Self-pay | Admitting: Family Medicine

## 2021-07-04 ENCOUNTER — Ambulatory Visit: Payer: Medicare PPO | Admitting: Family Medicine

## 2021-07-04 ENCOUNTER — Other Ambulatory Visit: Payer: Self-pay | Admitting: Family Medicine

## 2021-07-04 VITALS — BP 128/70 | HR 91 | Ht 62.0 in | Wt 191.6 lb

## 2021-07-04 DIAGNOSIS — G4736 Sleep related hypoventilation in conditions classified elsewhere: Secondary | ICD-10-CM | POA: Insufficient documentation

## 2021-07-04 DIAGNOSIS — D681 Hereditary factor XI deficiency: Secondary | ICD-10-CM

## 2021-07-04 DIAGNOSIS — I10 Essential (primary) hypertension: Secondary | ICD-10-CM

## 2021-07-04 DIAGNOSIS — C3432 Malignant neoplasm of lower lobe, left bronchus or lung: Secondary | ICD-10-CM

## 2021-07-04 DIAGNOSIS — D649 Anemia, unspecified: Secondary | ICD-10-CM

## 2021-07-04 DIAGNOSIS — Z0000 Encounter for general adult medical examination without abnormal findings: Secondary | ICD-10-CM

## 2021-07-04 DIAGNOSIS — J449 Chronic obstructive pulmonary disease, unspecified: Secondary | ICD-10-CM

## 2021-07-04 DIAGNOSIS — E782 Mixed hyperlipidemia: Secondary | ICD-10-CM | POA: Diagnosis not present

## 2021-07-04 DIAGNOSIS — J432 Centrilobular emphysema: Secondary | ICD-10-CM

## 2021-07-04 DIAGNOSIS — K219 Gastro-esophageal reflux disease without esophagitis: Secondary | ICD-10-CM

## 2021-07-04 DIAGNOSIS — G4733 Obstructive sleep apnea (adult) (pediatric): Secondary | ICD-10-CM

## 2021-07-04 DIAGNOSIS — R7309 Other abnormal glucose: Secondary | ICD-10-CM

## 2021-07-04 DIAGNOSIS — D510 Vitamin B12 deficiency anemia due to intrinsic factor deficiency: Secondary | ICD-10-CM

## 2021-07-04 DIAGNOSIS — F331 Major depressive disorder, recurrent, moderate: Secondary | ICD-10-CM

## 2021-07-04 DIAGNOSIS — Z9989 Dependence on other enabling machines and devices: Secondary | ICD-10-CM

## 2021-07-04 DIAGNOSIS — E538 Deficiency of other specified B group vitamins: Secondary | ICD-10-CM

## 2021-07-04 MED ORDER — VENLAFAXINE HCL ER 75 MG PO CP24: 225.0000 mg | ORAL_CAPSULE | Freq: Every day | ORAL | 3 refills | Status: DC

## 2021-07-04 MED ORDER — SIMVASTATIN 80 MG PO TABS: 80.0000 mg | ORAL_TABLET | Freq: Every day | ORAL | 3 refills | Status: DC

## 2021-07-04 MED ORDER — OMEPRAZOLE 20 MG PO CPDR: 20.0000 mg | DELAYED_RELEASE_CAPSULE | Freq: Every day | ORAL | 3 refills | Status: DC

## 2021-07-04 MED ORDER — CYANOCOBALAMIN 1000 MCG/ML IJ SOLN: 1000.0000 ug | INTRAMUSCULAR | 3 refills | Status: DC

## 2021-07-04 NOTE — Patient Instructions (Addendum)
Thank you for coming to the office today. ? ?Reconsider Trelegy vs Breztri - have 3 medicines all in one (you would have to stop the Symbicort + Spiriva) - ask your Pulmonologist again. ? ?Refilled - Omeprazole, Vitamin B12 injections, Venlafaxine XR, Simvastatin ? ?Breztri sample today 2 puffs twice a day. You can stop the Symbicort / Spiriva tomorrow. ? ?Sample will last 7 days only, let me know if you like it and we can order it. ? ? ?DUE for FASTING BLOOD WORK (no food or drink after midnight before the lab appointment, only water or coffee without cream/sugar on the morning of) ? ?SCHEDULE "Lab Only" visit in the morning at the clinic for lab draw in 3 MONTHS  ? ?- Make sure Lab Only appointment is at about 1 week before your next appointment, so that results will be available ? ?For Lab Results, once available within 2-3 days of blood draw, you can can log in to MyChart online to view your results and a brief explanation. Also, we can discuss results at next follow-up visit. ? ? ?Please schedule a Follow-up Appointment to: Return in about 3 months (around 10/03/2021) for 3 month fasting lab only then 1 week later Annual Physical. ? ?If you have any other questions or concerns, please feel free to call the office or send a message through [facility]. You may also schedule an earlier appointment if necessary. ? ?Additionally, you may be receiving a survey about your experience at our office within a few days to 1 week by e-mail or mail. We value your feedback. ? ?Nobie Putnam, DO ?[place] ?

## 2021-07-04 NOTE — Progress Notes (Signed)
? ?Subjective:  ? ? Patient ID: Elizabeth [name], female    DOB: 09-Jul-1950, 71 y.o.   MRN: [id number] ? ?Elizabeth [name] is a 71 y.o. female presenting on 07/04/2021 for Establish Care ? ?Previous PCP at [facility] who is retiring, previous PCP for 20 years. ? ?HPI ? ?Specialists ?Dr Anne Shutter [facility] Pulmonology ?Dr Ubaldo Glassing [facility] Cardiology ? ?Recurrent Sinus Infections ?Has had multiple in past year ?Last one several months ago, usually resolved with Doxycycline course and steroids. ? ? ?Centrilobular Emphysema / COPD ?Followed by Pulm ?Using Symbicort, Spiriva, and Albuterol PRN.  ? ?Lung Cancer LLL, in remission ?S/p Radiation therapy 3 years+ ago, last scan 09/2020 ? ?CHRONIC HTN: ?Reports controlled ?Current Meds - Amlodipine 5mg  daily, Furosemide 20mg  daily   ?Reports good compliance, took meds today. Tolerating well, w/o complaints. ?Admits edema varicose veins ?Denies CP, dyspnea, HA, dizziness / lightheadedness ? ? ?OSA, on CPAP + Oxygen nightly ? ?Major Depression, recurrent moderate ?Stable chronic issue, on medication ? ?Pernicious Anemia ?B12 Deficiency ?Last labs 04/2021, normal Hemoglobin, iron and ferritin. No recent B12, last was elevated 09/2020 ? ? ?Health Maintenance: ? ?No prior Colonoscopy. Completed Cologuard previously, due for repeat. ? ? ?  07/04/2021  ?  9:57 AM  ?Depression screen PHQ 2/9  ?Decreased Interest 1  ?Down, Depressed, Hopeless 1  ?PHQ - 2 Score 2  ?Altered sleeping 0  ?Tired, decreased energy 3  ?Change in appetite 1  ?Feeling bad or failure about yourself  0  ?Trouble concentrating 1  ?Moving slowly or fidgety/restless 0  ?Suicidal thoughts 0  ?PHQ-9 Score 7  ?Difficult doing work/chores Not difficult at all  ? ? ?Past Medical History:  ?Diagnosis Date  ? Allergy   ? Anxiety   ? B12 deficiency   ? Carpal tunnel syndrome   ? COPD (chronic obstructive pulmonary disease) ([place])   ? Depression   ? Genetic testing   ? Common Cancers panel (47 genes) @ [facility] -  No pathogenic mutations detected  ? Hyperlipidemia   ? Hypertension   ? Iron deficiency anemia 10/16/2016  ? Lipoma of skin   ? Normocytic anemia 10/16/2016  ? QT prolongation   ? Substance abuse ([place])   ? ?Past Surgical History:  ?Procedure Laterality Date  ? ABDOMINAL HYSTERECTOMY    ? BREAST EXCISIONAL BIOPSY Left   ? CESAREAN SECTION    ? TUBAL LIGATION    ? ?Social History  ? ?Socioeconomic History  ? Marital status: Divorced  ?  Spouse name: Not on file  ? Number of children: Not on file  ? Years of education: Not on file  ? Highest education level: Not on file  ?Occupational History  ? Not on file  ?Tobacco Use  ? Smoking status: Every Day  ?  Packs/day: 0.50  ?  Years: 52.00  ?  Pack years: 26.00  ?  Types: Cigarettes  ? Smokeless tobacco: Never  ? Tobacco comments:  ?  5 ciggarette a day--03/02/2020  ?Vaping Use  ? Vaping Use: Some days  ?Substance and Sexual Activity  ? Alcohol use: No  ? Drug use: No  ? Sexual activity: Not Currently  ?Other Topics Concern  ? Not on file  ?Social History Narrative  ? Not on file  ? ?Social Determinants of Health  ? ?Financial Resource Strain: Not on file  ?Food Insecurity: Not on file  ?Transportation Needs: Not on file  ?Physical Activity: Not on file  ?Stress: Not  on file  ?Social Connections: Not on file  ?Intimate Partner Violence: Not on file  ? ?Family History  ?Problem Relation Age of Onset  ? Ovarian cancer Mother 62  ?     deceased 64  ? Breast cancer Mother 37  ? Stroke Father   ? Lung cancer Paternal Aunt   ? Lung cancer Cousin   ? ?Current Outpatient Medications on File Prior to Visit  ?Medication Sig  ? albuterol (PROVENTIL) (2.5 MG/3ML) 0.083% nebulizer solution USE 1 VIAL IN NEBULIZER EVERY 6 HOURS AS NEEDED FOR WHEEZING AND FOR SHORTNESS OF BREATH  ? albuterol (VENTOLIN HFA) 108 (90 Base) MCG/ACT inhaler Inhale 2 puffs into the lungs every 6 (six) hours as needed for wheezing or shortness of breath.  ? amLODipine (NORVASC) 5 MG tablet Take 5 mg by mouth  daily.  ? ARIPiprazole (ABILIFY) 5 MG tablet Take 1 tablet (5 mg total) by mouth daily.  ? budesonide-formoterol (SYMBICORT) 160-4.5 MCG/ACT inhaler Inhale into the lungs.  ? fluticasone (FLONASE) 50 MCG/ACT nasal spray Place 2 sprays into both nostrils daily.  ? furosemide (LASIX) 20 MG tablet Take 20 mg by mouth daily.  ? magnesium oxide (MAG-OX) 400 MG tablet Take by mouth.  ? Multiple Vitamin (MULTI-VITAMINS) TABS Take by mouth.  ? potassium chloride SA (KLOR-CON M) 20 MEQ tablet Take 20 mEq by mouth daily.  ? tiotropium (SPIRIVA) 18 MCG inhalation capsule inhale contents of 1 capsule by mouth once daily  ? ?No current facility-administered medications on file prior to visit.  ? ? ?Review of Systems ?Per HPI unless specifically indicated above ? ? ?   ?Objective:  ?  ?BP 128/70   Pulse 91   Ht 5\' 2"  (1.575 m)   Wt 191 lb 9.6 oz (86.9 kg)   SpO2 97%   BMI 35.04 kg/m?   ?Wt Readings from Last 3 Encounters:  ?07/04/21 191 lb 9.6 oz (86.9 kg)  ?10/20/20 189 lb 9.6 oz (86 kg)  ?08/17/20 183 lb (83 kg)  ?  ?Physical Exam ?Vitals and nursing note reviewed.  ?Constitutional:   ?   General: She is not in acute distress. ?   Appearance: She is well-developed. She is not diaphoretic.  ?   Comments: Well-appearing, comfortable, cooperative  ?HENT:  ?   Head: Normocephalic and atraumatic.  ?Eyes:  ?   General:     ?   Right eye: No discharge.     ?   Left eye: No discharge.  ?   Conjunctiva/sclera: Conjunctivae normal.  ?Neck:  ?   Thyroid: No thyromegaly.  ?Cardiovascular:  ?   Rate and Rhythm: Normal rate and regular rhythm.  ?   Heart sounds: Normal heart sounds. No murmur heard. ?Pulmonary:  ?   Effort: Pulmonary effort is normal. No respiratory distress.  ?   Breath sounds: Normal breath sounds. No wheezing or rales.  ?Musculoskeletal:     ?   General: Normal range of motion.  ?   Cervical back: Normal range of motion and neck supple.  ?Lymphadenopathy:  ?   Cervical: No cervical adenopathy.  ?Skin: ?   General:  Skin is warm and dry.  ?   Findings: No erythema or rash.  ?Neurological:  ?   Mental Status: She is alert and oriented to person, place, and time.  ?Psychiatric:     ?   Behavior: Behavior normal.  ?   Comments: Well groomed, good eye contact, normal speech and thoughts  ? ? ? ? ?  Results for orders placed or performed during the hospital encounter of 10/18/20  ?I-STAT creatinine  ?Result Value Ref Range  ? Creatinine, Ser 0.70 0.44 - 1.00 mg/dL  ? ?   ?Assessment & Plan:  ? ?Problem List Items Addressed This Visit   ? ? Pernicious anemia  ? Relevant Medications  ? cyanocobalamin (,VITAMIN B-12,) 1000 MCG/ML injection  ? OSA on CPAP  ? Normocytic anemia  ? Relevant Medications  ? cyanocobalamin (,VITAMIN B-12,) 1000 MCG/ML injection  ? 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 Morbid obesity ([place])  ? Mixed hyperlipidemia  ? Relevant Medications  ? simvastatin (ZOCOR) 80 MG tablet  ? Malignant neoplasm of lower lobe of left lung ([place])  ? Major depressive disorder, recurrent, moderate ([place])  ? Relevant Medications  ? ARIPiprazole (ABILIFY) 5 MG tablet  ? venlafaxine XR (EFFEXOR XR) 75 MG 24 hr capsule  ? 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 Centrilobular emphysema ([place]) - Primary  ? Benign essential hypertension  ? Relevant Medications  ? simvastatin (ZOCOR) 80 MG tablet  ? B12 deficiency  ? Relevant Medications  ? cyanocobalamin (,VITAMIN B-12,) 1000 MCG/ML injection  ? ?Other Visit Diagnoses   ? ? Gastroesophageal reflux disease without esophagitis      ? Relevant Medications  ? omeprazole (PRILOSEC) 20 MG capsule  ? ?  ?  ?B12 deficiency ?Pernicious Anemia ?Prior labs reviewed 04/2021 ?Renew Inj Vitamin B12 1077mcg daily self admin at home w/ daughter ?Check labs CBC Anemia panel B12 upcoming ? ?Major depression recurrent ?Mostly controlled or managed on medication ?Continue Abilify 5mg  daily, Venlafaxine 225mg  daily (75 x 3) ? ?HLD ?Continue Simvastatin 80mg  ?Future consider dose  reduction of other agent if preferred ? ?Centrilobular Emphysema COPD ?Trial on Breztri once we obtain samples, ordered today will notify patient when in stock. ?Would plan to DC Symbicort and Spiriva and switch to B

## 2021-08-04 ENCOUNTER — Encounter: Payer: Self-pay | Admitting: Family Medicine

## 2021-08-04 ENCOUNTER — Telehealth (INDEPENDENT_AMBULATORY_CARE_PROVIDER_SITE_OTHER): Payer: Medicare PPO | Admitting: Family Medicine

## 2021-08-04 VITALS — Wt 191.0 lb

## 2021-08-04 DIAGNOSIS — J441 Chronic obstructive pulmonary disease with (acute) exacerbation: Secondary | ICD-10-CM | POA: Diagnosis not present

## 2021-08-04 MED ORDER — PREDNISONE 20 MG PO TABS: ORAL_TABLET | ORAL | 0 refills | Status: DC

## 2021-08-04 MED ORDER — DOXYCYCLINE HYCLATE 100 MG PO TABS: 100.0000 mg | ORAL_TABLET | Freq: Two times a day (BID) | ORAL | 0 refills | Status: DC

## 2021-08-04 NOTE — Progress Notes (Signed)
Virtual Visit via Telephone ?The purpose of this virtual visit is to provide medical care while limiting exposure to the novel coronavirus (COVID19) for both patient and office staff. ? ?Consent was obtained for phone visit:  Yes.   ?Answered questions that patient had about telehealth interaction:  Yes.   ?I discussed the limitations, risks, security and privacy concerns of performing an evaluation and management service by telephone. I also discussed with the patient that there may be a patient responsible charge related to this service. The patient expressed understanding and agreed to proceed. ? ?Patient Location: Home ?Provider Location: [facility] (Office) ? ?Participants in virtual visit: ?- Patient: Elizabeth [name] "[address], Daughter Elizabeth [name] ?- CMA: Orinda Kenner, CMA ?- Provider: Dr Parks Ranger ? ?---------------------------------------------------------------------- ?Chief Complaint  ?Patient presents with  ? COPD  ? ? ?S: Reviewed CMA documentation. I have called patient and gathered additional HPI as follows: ? ?COPD Exacerbation ?Reports that symptoms started 1 week ago with sinusitis, then worsening to productive cough, shortness of breath. Worsening now not improved. ?Using Albuterol Nebulizer, Symbicort and Spiriva ? ?Denies any known or suspected exposure to person with or possibly with COVID19. ? ?Denies any fevers, chills, sweats, body [name], sinus pain or pressure, headache, abdominal pain, diarrhea ? ?Past Medical History:  ?Diagnosis Date  ? Allergy   ? Anxiety   ? B12 deficiency   ? Carpal tunnel syndrome   ? COPD (chronic obstructive pulmonary disease) ([place])   ? Depression   ? Genetic testing   ? Common Cancers panel (47 genes) @ [facility] - No pathogenic mutations detected  ? Hyperlipidemia   ? Hypertension   ? Iron deficiency anemia 10/16/2016  ? Lipoma of skin   ? Normocytic anemia 10/16/2016  ? QT prolongation   ? Substance abuse ([place])   ? ?Social History   ? ?Tobacco Use  ? Smoking status: Every Day  ?  Packs/day: 0.50  ?  Years: 52.00  ?  Pack years: 26.00  ?  Types: Cigarettes  ? Smokeless tobacco: Never  ? Tobacco comments:  ?  5 ciggarette a day--03/02/2020  ?Vaping Use  ? Vaping Use: Some days  ?Substance Use Topics  ? Alcohol use: No  ? Drug use: No  ? ? ?Current Outpatient Medications:  ?  albuterol (PROVENTIL) (2.5 MG/3ML) 0.083% nebulizer solution, USE 1 VIAL IN NEBULIZER EVERY 6 HOURS AS NEEDED FOR WHEEZING AND FOR SHORTNESS OF BREATH, Disp: 225 mL, Rfl: 0 ?  albuterol (VENTOLIN HFA) 108 (90 Base) MCG/ACT inhaler, Inhale 2 puffs into the lungs every 6 (six) hours as needed for wheezing or shortness of breath., Disp: 8 g, Rfl: 5 ?  amLODipine (NORVASC) 5 MG tablet, Take 5 mg by mouth daily., Disp: , Rfl:  ?  ARIPiprazole (ABILIFY) 5 MG tablet, Take 1 tablet (5 mg total) by mouth daily., Disp: 90 tablet, Rfl: 3 ?  budesonide-formoterol (SYMBICORT) 160-4.5 MCG/ACT inhaler, Inhale into the lungs., Disp: , Rfl:  ?  cyanocobalamin (,VITAMIN B-12,) 1000 MCG/ML injection, Inject 1 mL (1,000 mcg total) into the muscle every 30 (thirty) days., Disp: 3 mL, Rfl: 3 ?  doxycycline (VIBRA-TABS) 100 MG tablet, Take 1 tablet (100 mg total) by mouth 2 (two) times daily. For 10 days. Take with full glass of water, stay upright 30 min after taking., Disp: 20 tablet, Rfl: 0 ?  fluticasone (FLONASE) 50 MCG/ACT nasal spray, Place 2 sprays into both nostrils daily., Disp: , Rfl: 1 ?  furosemide (LASIX)  20 MG tablet, Take 20 mg by mouth daily., Disp: , Rfl: 0 ?  magnesium oxide (MAG-OX) 400 MG tablet, Take by mouth., Disp: , Rfl:  ?  Multiple Vitamin (MULTI-VITAMINS) TABS, Take by mouth., Disp: , Rfl:  ?  omeprazole (PRILOSEC) 20 MG capsule, Take 1 capsule (20 mg total) by mouth daily before breakfast., Disp: 90 capsule, Rfl: 3 ?  potassium chloride SA (KLOR-CON M) 20 MEQ tablet, Take 20 mEq by mouth daily., Disp: , Rfl:  ?  predniSONE (DELTASONE) 20 MG tablet, Take daily with food.  Start with 60mg  (3 pills) x 2 days, then reduce to 40mg  (2 pills) x 2 days, then 20mg  (1 pill) x 3 days, Disp: 13 tablet, Rfl: 0 ?  simvastatin (ZOCOR) 80 MG tablet, Take 1 tablet (80 mg total) by mouth at bedtime., Disp: 90 tablet, Rfl: 3 ?  tiotropium (SPIRIVA) 18 MCG inhalation capsule, inhale contents of 1 capsule by mouth once daily, Disp: , Rfl:  ?  venlafaxine XR (EFFEXOR XR) 75 MG 24 hr capsule, Take 3 capsules (225 mg total) by mouth daily with breakfast., Disp: 270 capsule, Rfl: 3 ? ? ?  08/04/2021  ? 11:14 AM 07/04/2021  ?  9:57 AM  ?Depression screen PHQ 2/9  ?Decreased Interest 1 1  ?Down, Depressed, Hopeless 1 1  ?PHQ - 2 Score 2 2  ?Altered sleeping 0 0  ?Tired, decreased energy 3 3  ?Change in appetite 1 1  ?Feeling bad or failure about yourself  0 0  ?Trouble concentrating 1 1  ?Moving slowly or fidgety/restless 0 0  ?Suicidal thoughts 0 0  ?PHQ-9 Score 7 7  ?Difficult doing work/chores  Not difficult at all  ? ? ? ?  08/04/2021  ? 11:14 AM 07/04/2021  ?  9:57 AM  ?GAD 7 : Generalized Anxiety Score  ?Nervous, Anxious, on Edge  1  ?Control/stop worrying 1 1  ?Worry too much - different things 1 1  ?Trouble relaxing 1 1  ?Restless 0 0  ?Easily annoyed or irritable 1 1  ?Afraid - awful might happen 0 0  ?Total GAD 7 Score  5  ?Anxiety Difficulty Not difficult at all Not difficult at all  ? ? ?-------------------------------------------------------------------------- ?O: No physical exam performed due to remote telephone encounter. ? ?Lab results reviewed. ? ?No results found for this or any previous visit (from the past 2160 hour(s)). ? ?-------------------------------------------------------------------------- ?A&P: ? ?Problem List Items Addressed This Visit   ?None ?Visit Diagnoses   ? ? COPD with acute exacerbation ([place])    -  Primary  ? Relevant Medications  ? doxycycline (VIBRA-TABS) 100 MG tablet  ? predniSONE (DELTASONE) 20 MG tablet  ? ?  ? ?Consistent with mild to moderate acute exacerbation of COPD  with worsening productive cough. ? ?Plan: ?1. Start Prednisone taper 7 days ?2. Start taking Doxycycline antibiotic 100mg  twice daily for 10 days. Take with full glass of water and stay upright for at least 30 min after taking, may be seated or standing, but should NOT lay down. This is just a safety precaution, if this medicine does not go all the way down throat well it could cause some burning discomfort to throat and esophagus. ? ?Use albuterol nebulizer ?Continue Spiriva + Symbicort - future consider switch to Judithann Sauger if interested ? ?Consider CXR ?RTC about 1 week if not improving, otherwise strict return criteria to go to ED ? ? ?Meds ordered this encounter  ?Medications  ? doxycycline (VIBRA-TABS) 100 MG tablet  ?  Sig: Take 1 tablet (100 mg total) by mouth 2 (two) times daily. For 10 days. Take with full glass of water, stay upright 30 min after taking.  ?  Dispense:  20 tablet  ?  Refill:  0  ? predniSONE (DELTASONE) 20 MG tablet  ?  Sig: Take daily with food. Start with 60mg  (3 pills) x 2 days, then reduce to 40mg  (2 pills) x 2 days, then 20mg  (1 pill) x 3 days  ?  Dispense:  13 tablet  ?  Refill:  0  ? ? ?Follow-up: ?- Return in 1 week if not improved ? ?Patient verbalizes understanding with the above medical recommendations including the limitation of remote medical advice. ? ?Specific follow-up and call-back criteria were given for patient to follow-up or seek medical care more urgently if needed. ? ? ?- Time spent in direct consultation with patient on phone: 6 minutes ? ? ?Nobie Putnam, DO ?[facility] ?[facility] ?08/04/2021, 11:21 AM ? ?

## 2021-09-07 ENCOUNTER — Ambulatory Visit (INDEPENDENT_AMBULATORY_CARE_PROVIDER_SITE_OTHER): Payer: Medicare PPO

## 2021-09-07 DIAGNOSIS — Z1231 Encounter for screening mammogram for malignant neoplasm of breast: Secondary | ICD-10-CM

## 2021-09-07 DIAGNOSIS — Z1211 Encounter for screening for malignant neoplasm of colon: Secondary | ICD-10-CM | POA: Diagnosis not present

## 2021-09-07 DIAGNOSIS — Z0000 Encounter for general adult medical examination without abnormal findings: Secondary | ICD-10-CM | POA: Diagnosis not present

## 2021-09-07 NOTE — Progress Notes (Signed)
Subjective:    I connected with  [name] on 09/07/21 by a audio enabled telemedicine application and verified that I am speaking with the correct person using two identifiers.  Patient Location: Home  Provider Location: Office/Clinic  I discussed the limitations of evaluation and management by telemedicine. The patient expressed understanding and agreed to proceed.    Elizabeth [name] is a 71 y.o. female who presents for Medicare Annual (Subsequent) preventive examination.  Review of Systems   Per HPI unless specifically indicated below        Objective:    Today's Vitals   09/07/21 1443  PainSc: 0-No pain   There is no height or weight on file to calculate BMI.     10/20/2020   11:32 AM 04/20/2020   10:16 AM 10/22/2019   10:15 AM 07/08/2019    1:28 PM 06/19/2019    9:01 AM 08/26/2018   10:42 AM 03/21/2018    8:23 PM  Advanced Directives  Does Patient Have a Medical Advance Directive? No Yes Yes Yes Yes No No  Type of Corporate treasurer of [place];Living will Living will;Healthcare Power of [place]    Does patient want to make changes to medical advance directive?     Yes (MAU/Ambulatory/Procedural Areas - Information given)    Copy of [place] in Chart?    No - copy requested     Would patient like information on creating a medical advance directive?       No - Patient declined    Current Medications (verified) Outpatient Encounter Medications as of 09/07/2021  Medication Sig   albuterol (PROVENTIL) (2.5 MG/3ML) 0.083% nebulizer solution USE 1 VIAL IN NEBULIZER EVERY 6 HOURS AS NEEDED FOR WHEEZING AND FOR SHORTNESS OF BREATH   albuterol (VENTOLIN HFA) 108 (90 Base) MCG/ACT inhaler Inhale 2 puffs into the lungs every 6 (six) hours as needed for wheezing or shortness of breath.   ARIPiprazole (ABILIFY) 5 MG tablet Take 1 tablet (5 mg total) by mouth daily.    budesonide-formoterol (SYMBICORT) 160-4.5 MCG/ACT inhaler Inhale 2 puffs into the lungs 2 (two) times daily.   cyanocobalamin (,VITAMIN B-12,) 1000 MCG/ML injection Inject 1 mL (1,000 mcg total) into the muscle every 30 (thirty) days.   fluticasone (FLONASE) 50 MCG/ACT nasal spray Place 2 sprays into both nostrils daily.   furosemide (LASIX) 20 MG tablet Take 20 mg by mouth daily.   losartan (COZAAR) 50 MG tablet Take 50 mg by mouth daily.   magnesium oxide (MAG-OX) 400 MG tablet Take by mouth.   Multiple Vitamin (MULTI-VITAMINS) TABS Take by mouth.   omeprazole (PRILOSEC) 20 MG capsule Take 1 capsule (20 mg total) by mouth daily before breakfast.   potassium chloride SA (KLOR-CON M) 20 MEQ tablet Take 20 mEq by mouth daily.   simvastatin (ZOCOR) 80 MG tablet Take 1 tablet (80 mg total) by mouth at bedtime.   tiotropium (SPIRIVA) 18 MCG inhalation capsule inhale contents of 1 capsule by mouth once daily   venlafaxine XR (EFFEXOR XR) 75 MG 24 hr capsule Take 3 capsules (225 mg total) by mouth daily with breakfast.   amLODipine (NORVASC) 5 MG tablet Take 5 mg by mouth daily. (Patient not taking: Reported on 09/07/2021)   [DISCONTINUED] doxycycline (VIBRA-TABS) 100 MG tablet Take 1 tablet (100 mg total) by mouth 2 (two) times daily. For 10 days. Take with full glass of water, stay upright 30 min after  taking. (Patient not taking: Reported on 09/07/2021)   [DISCONTINUED] predniSONE (DELTASONE) 20 MG tablet Take daily with food. Start with 60mg  (3 pills) x 2 days, then reduce to 40mg  (2 pills) x 2 days, then 20mg  (1 pill) x 3 days (Patient not taking: Reported on 09/07/2021)   No facility-administered encounter medications on file as of 09/07/2021.    Allergies (verified) Moxifloxacin, Levofloxacin, and Amoxicillin-pot clavulanate   History: Past Medical History:  Diagnosis Date   Allergy    Anxiety    B12 deficiency    Carpal tunnel syndrome    COPD (chronic obstructive pulmonary disease) (HCC)     Depression    Genetic testing    Common Cancers panel (47 genes) @ [facility] - No pathogenic mutations detected   Hyperlipidemia    Hypertension    Iron deficiency anemia 10/16/2016   Lipoma of skin    Normocytic anemia 10/16/2016   QT prolongation    Substance abuse ([place])    Past Surgical History:  Procedure Laterality Date   ABDOMINAL HYSTERECTOMY     BREAST EXCISIONAL BIOPSY Left    CESAREAN SECTION     TUBAL LIGATION     Family History  Problem Relation Age of Onset   Ovarian cancer Mother 38       deceased 55   Breast cancer Mother 70   Stroke Father    Lung cancer Paternal Aunt    Lung cancer Cousin    Social History   Socioeconomic History   Marital status: Divorced    Spouse name: Not on file   Number of children: 1   Years of education: Not on file   Highest education level: Not on file  Occupational History   Occupation: Retired  Tobacco Use   Smoking status: Every Day    Packs/day: 0.50    Years: 52.00    Total pack years: 26.00    Types: Cigarettes   Smokeless tobacco: Never   Tobacco comments:    5 ciggarette a day--03/02/2020  Vaping Use   Vaping Use: Former  Substance and Sexual Activity   Alcohol use: No   Drug use: No   Sexual activity: Not Currently  Other Topics Concern   Not on file  Social History Narrative   Not on file   Social Determinants of Health   Financial Resource Strain: Low Risk  (09/07/2021)   Overall Financial Resource Strain (CARDIA)    Difficulty of Paying Living Expenses: Not hard at all  Food Insecurity: No Food Insecurity (09/07/2021)   Hunger Vital Sign    Worried About Running Out of Food in the Last Year: Never true    Ran Out of Food in the Last Year: Never true  Transportation Needs: No Transportation Needs (09/07/2021)   PRAPARE - Hydrologist (Medical): No    Lack of Transportation (Non-Medical): No  Physical Activity: Insufficiently Active (09/07/2021)   Exercise Vital Sign    Days  of Exercise per Week: 2 days    Minutes of Exercise per Session: 30 min  Stress: No Stress Concern Present (09/07/2021)   [place]    Feeling of Stress : Only a little  Social Connections: Not on file    Tobacco Counseling Ready to quit: Not Answered Counseling given: Not Answered Tobacco comments: 5 ciggarette a day--03/02/2020   Clinical Intake:     Pain : No/denies pain Pain Score: 0-No pain  Nutritional Status: BMI > 30  Obese Nutritional Risks: None Diabetes: No  How often do you need to have someone help you when you read instructions, pamphlets, or other written materials from your doctor or pharmacy?: 1 - Never  Diabetic? No  Interpreter Needed?: No  Information entered by :: Donnie Mesa, CMA   Activities of Daily Living    09/07/2021    2:35 PM  In your present state of health, do you have any difficulty performing the following activities:  Hearing? 0  Vision? 1  Difficulty concentrating or making decisions? 0  Walking or climbing stairs? 1  Dressing or bathing? 0  Doing errands, shopping? 0    Patient Care Team: Olin Hauser, DO as PCP - General (Family Medicine) Telford Nab, RN as Oncology Nurse Navigator [name], MD as Radiation Oncologist (Radiation Oncology)  Indicate any recent Medical Services you may have received from other than [facility] providers in the past year (date may be approximate).    No hospitalization in the past 12 months.  Assessment:   This is a routine wellness examination for Elizabeth [name].  Hearing/Vision screen No results found.  Dietary issues and exercise activities discussed:     Goals Addressed   None    Depression Screen    08/04/2021   11:14 AM 07/04/2021    9:57 AM  PHQ 2/9 Scores  PHQ - 2 Score 2 2  PHQ- 9 Score 7 7    Fall Risk    09/07/2021    2:35 PM 08/04/2021   11:13 AM 07/04/2021    9:57 AM 06/26/2019   10:53 AM   Fall Risk   Falls in the past year? 0 0 0 0  Number falls in past yr: 0 0 0 0  Injury with Fall? 0 0 0 0  Risk for fall due to : No Fall Risks No Fall Risks No Fall Risks   Follow up Falls evaluation completed Falls evaluation completed Falls evaluation completed     [place]:  Any stairs in or around the home? Yes  If so, are there any without handrails? Yes  Home free of loose throw rugs in walkways, pet beds, electrical cords, etc? Yes  Adequate lighting in your home to reduce risk of falls? Yes   ASSISTIVE DEVICES UTILIZED TO PREVENT FALLS:  Life alert? No  Use of a cane, walker or w/c? No  Grab bars in the bathroom? Yes  Shower chair or bench in shower? Yes  Elevated toilet seat or a handicapped toilet? No   Cognitive Function:        09/07/2021    2:36 PM  6CIT Screen  What Year? 0 points  What month? 0 points  What time? 0 points  Count back from 20 0 points  Months in reverse 0 points  Repeat phrase 0 points  Total Score 0 points    Immunizations Immunization History  Administered Date(s) Administered   Fluad Quad(high Dose 65+) 12/21/2020   Influenza Split 01/14/2009   Influenza, High Dose Seasonal PF 01/15/2017, 01/01/2020   Influenza, Seasonal, Injecte, Preservative Fre 01/20/2010, 12/07/2011   Influenza,inj,Quad PF,6+ Mos 01/07/2019   Influenza-Unspecified 03/16/2008, 12/28/2010, 01/03/2012, 12/25/2012, 12/15/2014, 01/14/2016, 01/15/2017, 12/31/2017   PFIZER Comirnaty(Gray Top)Covid-19 Tri-Sucrose Vaccine 04/28/2019, 05/19/2019, 01/01/2020   PFIZER(Purple Top)SARS-COV-2 Vaccination 04/28/2019, 05/19/2019, 01/01/2020   Pfizer Covid-19 Vaccine Bivalent Booster 67yrs & up 03/07/2021   Pneumococcal Conjugate-13 02/16/2014   Pneumococcal Polysaccharide-23 09/01/2009, 04/27/2016   Tdap  12/01/2009, 09/26/2010    TDAP status: Due, Education has been provided regarding the importance of this vaccine. Advised may receive  this vaccine at local pharmacy or Health Dept. Aware to provide a copy of the vaccination record if obtained from local pharmacy or Health Dept. Verbalized acceptance and understanding.  Flu Vaccine status: Up to date  Pneumococcal vaccine status: Up to date  Covid-19 vaccine status: Completed vaccines  Qualifies for Shingles Vaccine? Yes   Zostavax completed No   Shingrix Completed?: No.    Education has been provided regarding the importance of this vaccine. Patient has been advised to call insurance company to determine out of pocket expense if they have not yet received this vaccine. Advised may also receive vaccine at local pharmacy or Health Dept. Verbalized acceptance and understanding.  Screening Tests Health Maintenance  Topic Date Due   Hepatitis C Screening  Never done   Zoster Vaccines- Shingrix (1 of 2) Never done   COLONOSCOPY (Pts 45-49yrs Insurance coverage will need to be confirmed)  Never done   MAMMOGRAM  Never done   DEXA SCAN  Never done   TETANUS/TDAP  09/25/2020   INFLUENZA VACCINE  10/31/2021   Pneumonia Vaccine 88+ Years old  Completed   COVID-19 Vaccine  Completed   HPV VACCINES  Aged Out    Health Maintenance  Health Maintenance Due  Topic Date Due   Hepatitis C Screening  Never done   Zoster Vaccines- Shingrix (1 of 2) Never done   COLONOSCOPY (Pts 45-56yrs Insurance coverage will need to be confirmed)  Never done   MAMMOGRAM  Never done   DEXA SCAN  Never done   TETANUS/TDAP  09/25/2020    Colon Cancer Screening: Cologuard 4 yrs ago   Mammogram status: Ordered  . Pt provided with contact info and advised to call to schedule appt.   DEXA Scan: Pt declined   Lung Cancer Screening: (Low Dose CT Chest recommended if Age 70-80 years, 30 pack-year currently smoking OR have quit w/in 15years.) does qualify.   Lung Cancer Screening Referral: The pt get this done by her Pulmonologist Dr. Anne Shutter at [facility]  every year.   Additional  Screening:  Hepatitis C Screening: does qualify; Completed   Vision Screening: Recommended annual ophthalmology exams for early detection of glaucoma and other disorders of the eye. Is the patient up to date with their annual eye exam?  Yes  Who is the provider or what is the name of the office in which the patient attends annual eye exams? [facility]  If pt is not established with a provider, would they like to be referred to a provider to establish care? Yes .   Dental Screening: Recommended annual dental exams for proper oral hygiene  Community Resource Referral / Chronic Care Management: CRR required this visit?  No   CCM required this visit?  No      Plan:     I have personally reviewed and noted the following in the patient's chart:   Medical and social history Use of alcohol, tobacco or illicit drugs  Current medications and supplements including opioid prescriptions.  Functional ability and status Nutritional status Physical activity Advanced directives List of other physicians Hospitalizations, surgeries, and ER visits in previous 12 months Vitals Screenings to include cognitive, depression, and falls Referrals and appointments  In addition, I have reviewed and discussed with patient certain preventive protocols, quality metrics, and best practice recommendations. A written personalized care plan for preventive  services as well as general preventive health recommendations were provided to patient.     Wilson Singer, [place]   09/07/2021   Nurse Notes: Non-face -to face visit. The pt was made aware that she will need to sign a release of medical records for [place] to get her most recent mammogram from [facility].

## 2021-09-25 LAB — COLOGUARD: COLOGUARD: POSITIVE — AB

## 2021-09-26 ENCOUNTER — Encounter: Payer: Self-pay | Admitting: Family Medicine

## 2021-09-28 ENCOUNTER — Other Ambulatory Visit: Payer: Medicare PPO

## 2021-09-28 DIAGNOSIS — Z0000 Encounter for general adult medical examination without abnormal findings: Secondary | ICD-10-CM

## 2021-09-28 DIAGNOSIS — I10 Essential (primary) hypertension: Secondary | ICD-10-CM

## 2021-09-28 DIAGNOSIS — D681 Hereditary factor XI deficiency: Secondary | ICD-10-CM

## 2021-09-28 DIAGNOSIS — E782 Mixed hyperlipidemia: Secondary | ICD-10-CM

## 2021-09-28 DIAGNOSIS — J432 Centrilobular emphysema: Secondary | ICD-10-CM

## 2021-09-28 DIAGNOSIS — R7309 Other abnormal glucose: Secondary | ICD-10-CM

## 2021-09-28 DIAGNOSIS — E538 Deficiency of other specified B group vitamins: Secondary | ICD-10-CM

## 2021-09-28 DIAGNOSIS — D649 Anemia, unspecified: Secondary | ICD-10-CM

## 2021-09-28 DIAGNOSIS — D510 Vitamin B12 deficiency anemia due to intrinsic factor deficiency: Secondary | ICD-10-CM

## 2021-09-29 LAB — CBC WITH DIFFERENTIAL/PLATELET
Absolute Monocytes: 580 cells/uL (ref 200–950)
Basophils Absolute: 7 cells/uL (ref 0–200)
Basophils Relative: 0.1 %
Eosinophils Absolute: 7 cells/uL — ABNORMAL LOW (ref 15–500)
Eosinophils Relative: 0.1 %
HCT: 38.9 % (ref 35.0–45.0)
Hemoglobin: 12.9 g/dL (ref 11.7–15.5)
Lymphs Abs: 2574 cells/uL (ref 850–3900)
MCH: 28.6 pg (ref 27.0–33.0)
MCHC: 33.2 g/dL (ref 32.0–36.0)
MCV: 86.3 fL (ref 80.0–100.0)
MPV: 9.1 fL (ref 7.5–12.5)
Monocytes Relative: 8.4 %
Neutro Abs: 3733 cells/uL (ref 1500–7800)
Neutrophils Relative %: 54.1 %
Platelets: 268 10*3/uL (ref 140–400)
RBC: 4.51 10*6/uL (ref 3.80–5.10)
RDW: 13.2 % (ref 11.0–15.0)
Total Lymphocyte: 37.3 %
WBC: 6.9 10*3/uL (ref 3.8–10.8)

## 2021-09-29 LAB — COMPLETE METABOLIC PANEL WITH GFR
AG Ratio: 1.6 (calc) (ref 1.0–2.5)
ALT: 23 U/L (ref 6–29)
AST: 30 U/L (ref 10–35)
Albumin: 4 g/dL (ref 3.6–5.1)
Alkaline phosphatase (APISO): 106 U/L (ref 37–153)
BUN: 10 mg/dL (ref 7–25)
CO2: 28 mmol/L (ref 20–32)
Calcium: 9 mg/dL (ref 8.6–10.4)
Chloride: 105 mmol/L (ref 98–110)
Creat: 0.82 mg/dL (ref 0.60–1.00)
Globulin: 2.5 g/dL (calc) (ref 1.9–3.7)
Glucose, Bld: 105 mg/dL — ABNORMAL HIGH (ref 65–99)
Potassium: 4.3 mmol/L (ref 3.5–5.3)
Sodium: 140 mmol/L (ref 135–146)
Total Bilirubin: 0.5 mg/dL (ref 0.2–1.2)
Total Protein: 6.5 g/dL (ref 6.1–8.1)
eGFR: 76 mL/min/{1.73_m2} (ref >=60)

## 2021-09-29 LAB — HEMOGLOBIN A1C
Hgb A1c MFr Bld: 6.3 % of total Hgb — ABNORMAL HIGH (ref <5.7)
Mean Plasma Glucose: 134 mg/dL
eAG (mmol/L): 7.4 mmol/L

## 2021-09-29 LAB — LIPID PANEL
Cholesterol: 181 mg/dL (ref <200)
HDL: 55 mg/dL (ref >=50)
LDL Cholesterol (Calc): 99 mg/dL (calc)
Non-HDL Cholesterol (Calc): 126 mg/dL (calc) (ref <130)
Total CHOL/HDL Ratio: 3.3 (calc) (ref <5.0)
Triglycerides: 171 mg/dL — ABNORMAL HIGH (ref <150)

## 2021-09-29 LAB — IRON,TIBC AND FERRITIN PANEL
%SAT: 22 % (calc) (ref 16–45)
Ferritin: 55 ng/mL (ref 16–288)
Iron: 65 ug/dL (ref 45–160)
TIBC: 296 mcg/dL (calc) (ref 250–450)

## 2021-09-29 LAB — TSH: TSH: 1.7 mIU/L (ref 0.40–4.50)

## 2021-09-29 LAB — VITAMIN B12: Vitamin B-12: 599 pg/mL (ref 200–1100)

## 2021-10-05 ENCOUNTER — Encounter: Payer: Self-pay | Admitting: Family Medicine

## 2021-10-06 ENCOUNTER — Ambulatory Visit (INDEPENDENT_AMBULATORY_CARE_PROVIDER_SITE_OTHER): Payer: Medicare PPO | Admitting: Family Medicine

## 2021-10-06 ENCOUNTER — Other Ambulatory Visit: Payer: Self-pay | Admitting: Family Medicine

## 2021-10-06 ENCOUNTER — Encounter: Payer: Self-pay | Admitting: Family Medicine

## 2021-10-06 VITALS — BP 128/62 | HR 84 | Temp 96.9°F | Wt 188.0 lb

## 2021-10-06 DIAGNOSIS — R7303 Prediabetes: Secondary | ICD-10-CM

## 2021-10-06 DIAGNOSIS — E782 Mixed hyperlipidemia: Secondary | ICD-10-CM

## 2021-10-06 DIAGNOSIS — D649 Anemia, unspecified: Secondary | ICD-10-CM

## 2021-10-06 DIAGNOSIS — E538 Deficiency of other specified B group vitamins: Secondary | ICD-10-CM

## 2021-10-06 DIAGNOSIS — I10 Essential (primary) hypertension: Secondary | ICD-10-CM | POA: Diagnosis not present

## 2021-10-06 DIAGNOSIS — Z1231 Encounter for screening mammogram for malignant neoplasm of breast: Secondary | ICD-10-CM | POA: Diagnosis not present

## 2021-10-06 DIAGNOSIS — Z0000 Encounter for general adult medical examination without abnormal findings: Secondary | ICD-10-CM

## 2021-10-06 DIAGNOSIS — J432 Centrilobular emphysema: Secondary | ICD-10-CM

## 2021-10-06 DIAGNOSIS — D510 Vitamin B12 deficiency anemia due to intrinsic factor deficiency: Secondary | ICD-10-CM

## 2021-10-06 DIAGNOSIS — R195 Other fecal abnormalities: Secondary | ICD-10-CM

## 2021-10-06 DIAGNOSIS — E1169 Type 2 diabetes mellitus with other specified complication: Secondary | ICD-10-CM | POA: Insufficient documentation

## 2021-10-06 NOTE — Patient Instructions (Addendum)
Thank you for coming to the office today.  Hold Symbicort and Spiriva and switch to sample Breztri 2 puffs twice day. If you like it we can order the rx for you  COPD may be causing fatigue and weakness  Ferritin was mild reduced but still normal. I am okay with this and believe that it is not in the range of needing iron infusion.  Likely the area on back and neck are lipomas that can grow and interfere but should not cause major issues.  For Mammogram screening for breast cancer   Call the [facility] below anytime to schedule your own appointment now that order has been placed.  [facility] [address] [place], [place] [address] Phone: [phone]   Please schedule a Follow-up Appointment to: Return in about 6 months (around 04/08/2022) for 6 month fasting lab only then 1 week later Follow-up COPD [facility] Oncology Iron/Ferritin.  If you have any other questions or concerns, please feel free to call the office or send a message through [place]. You may also schedule an earlier appointment if necessary.  Additionally, you may be receiving a survey about your experience at our office within a few days to 1 week by e-mail or mail. We value your feedback.  Nobie Putnam, DO [place]

## 2021-10-06 NOTE — Progress Notes (Signed)
Subjective:    Patient ID: Elizabeth [name], female    DOB: 07-Jul-1950, 71 y.o.   MRN: [id number]  Elizabeth [name] is a 71 y.o. female presenting on 10/06/2021 for Annual Exam   HPI  Here for Annual Physical and Lab Review  Lipoma Back and Neck   Reduced Ferritin Following with [facility] for cancer but not with hematologist In past has had anemia before  Admits feeling tired and weak and feels better in afternoon Good nutrition No other activity or exertion that causes it  Specialists Dr Anne Shutter [facility] Pulmonology Dr Ubaldo Glassing [facility]   Recurrent Sinus Infections Has had multiple in past year Last one several months ago, usually resolved with Doxycycline course and steroids.     Centrilobular Emphysema / COPD Followed by Pulm Using Symbicort, Spiriva, and Albuterol PRN.  Interested to [employer]   Lung Cancer LLL, in remission S/p Radiation therapy 3 years+ ago, last scan 09/2020  HLD TG - Reports no concerns. Last lipid panel Improved LDL < 100 and TG >170 improving diet - Currently taking Simvastatin 80, tolerating well without side effects or myalgias   CHRONIC HTN: Reports controlled Current Meds - Amlodipine 62m daily, Furosemide 226mdaily   Reports good compliance, took meds today. Tolerating well, w/o complaints. Admits edema varicose veins Denies CP, dyspnea, HA, dizziness / lightheadedness     OSA, on CPAP + Oxygen nightly   Major Depression, recurrent moderate Stable chronic issue, on medication   Pernicious Anemia B12 Deficiency Labs reviewed  Health Maintenance:       10/06/2021   10:24 AM 08/04/2021   11:14 AM 07/04/2021    9:57 AM  Depression screen PHQ 2/9  Decreased Interest _0 Down, Depressed, Hopeless _1 PHQ - 2 Score _2 Altered sleeping 1 0 0  Tired, decreased energy _3 Change in appetite 0 1 1  Feeling bad or failure about yourself  0 0 0  Trouble concentrating _4 Moving slowly or  fidgety/restless 1 0 0  Suicidal thoughts 0 0 0  PHQ-9 Score _5 Difficult doing work/chores Not difficult at all  Not difficult at all    Past Medical History:  Diagnosis Date  . Allergy   . Anxiety   . B12 deficiency   . Carpal tunnel syndrome   . COPD (chronic obstructive pulmonary disease) ([facility]  . Depression   . Genetic testing    Common Cancers panel (47 genes) @ [facility] - No pathogenic mutations detected  . Hyperlipidemia   . Hypertension   . Iron deficiency anemia 10/16/2016  . Lipoma of skin   . Normocytic anemia 10/16/2016  . QT prolongation   . [facility]   Past Surgical History:  Procedure Laterality Date  . ABDOMINAL HYSTERECTOMY    . BREAST EXCISIONAL BIOPSY Left   . CESAREAN SECTION    . TUBAL LIGATION     Social History   Socioeconomic History  . Marital status: Divorced    Spouse name: Not on file  . Number of children: 1  . Years of education: Not on file  . Highest education level: Not on file  Occupational History  . Occupation: Retired  Tobacco Use  . Smoking status: Every Day    Packs/day: 0.50    Years: 52.00    Total pack years: 26.00    Types: Cigarettes  . Smokeless  tobacco: Never  . Tobacco comments:    5 ciggarette a day--03/02/2020  Vaping Use  . Vaping Use: Former  Substance and Sexual Activity  . Alcohol use: No  . Drug use: No  . Sexual activity: Not Currently  Other Topics Concern  . Not on file  Social History Narrative  . Not on file   Social Determinants of Health   Financial Resource Strain: Low Risk  (09/07/2021)   Overall Financial Resource Strain (CARDIA)   . Difficulty of Paying Living Expenses: Not hard at all  Food Insecurity: No Food Insecurity (09/07/2021)   Hunger Vital Sign   . Worried About Charity fundraiser in the Last Year: Never true   . Ran Out of Food in the Last Year: Never true  Transportation Needs: No Transportation Needs (09/07/2021)   PRAPARE - Transportation   . Lack of  Transportation (Medical): No   . Lack of Transportation (Non-Medical): No  Physical Activity: Insufficiently Active (09/07/2021)   Exercise Vital Sign   . Days of Exercise per Week: 2 days   . Minutes of Exercise per Session: 30 min  Stress: No Stress Concern Present (09/07/2021)   [place]   . Feeling of Stress : Only a little  Social Connections: Not on file  Intimate Partner Violence: Not on file   Family History  Problem Relation Age of Onset  . Ovarian cancer Mother 72       deceased 48  . Breast cancer Mother 85  . Stroke Father   . Lung cancer Paternal Aunt   . Lung cancer Cousin    Current Outpatient Medications on File Prior to Visit  Medication Sig  . albuterol (PROVENTIL) (2.5 MG/3ML) 0.083% nebulizer solution USE 1 VIAL IN NEBULIZER EVERY 6 HOURS AS NEEDED FOR WHEEZING AND FOR SHORTNESS OF BREATH  . albuterol (VENTOLIN HFA) 108 (90 Base) MCG/ACT inhaler Inhale 2 puffs into the lungs every 6 (six) hours as needed for wheezing or shortness of breath.  . ARIPiprazole (ABILIFY) 5 MG tablet Take 1 tablet (5 mg total) by mouth daily.  . budesonide-formoterol (SYMBICORT) 160-4.5 MCG/ACT inhaler Inhale 2 puffs into the lungs 2 (two) times daily.  . cyanocobalamin (,VITAMIN B-12,) 1000 MCG/ML injection Inject 1 mL (1,000 mcg total) into the muscle every 30 (thirty) days.  . fluticasone (FLONASE) 50 MCG/ACT nasal spray Place 2 sprays into both nostrils daily.  . furosemide (LASIX) 20 MG tablet Take 20 mg by mouth daily.  Marland Kitchen losartan (COZAAR) 50 MG tablet Take 50 mg by mouth daily.  . magnesium oxide (MAG-OX) 400 MG tablet Take by mouth.  . Multiple Vitamin (MULTI-VITAMINS) TABS Take by mouth.  Marland Kitchen omeprazole (PRILOSEC) 20 MG capsule Take 1 capsule (20 mg total) by mouth daily before breakfast.  . potassium chloride SA (KLOR-CON M) 20 MEQ tablet Take 20 mEq by mouth daily.  . simvastatin (ZOCOR) 80 MG tablet Take 1  tablet (80 mg total) by mouth at bedtime.  Marland Kitchen tiotropium (SPIRIVA) 18 MCG inhalation capsule inhale contents of 1 capsule by mouth once daily  . venlafaxine XR (EFFEXOR XR) 75 MG 24 hr capsule Take 3 capsules (225 mg total) by mouth daily with breakfast.  . amLODipine (NORVASC) 5 MG tablet Take 5 mg by mouth daily. (Patient not taking: Reported on 09/07/2021)   No current facility-administered medications on file prior to visit.    Review of Systems  Constitutional:  Negative for activity change, appetite  change, chills, diaphoresis, fatigue and fever.  HENT:  Negative for congestion and hearing loss.   Eyes:  Negative for visual disturbance.  Respiratory:  Negative for cough, chest tightness, shortness of breath and wheezing.   Cardiovascular:  Negative for chest pain, palpitations and leg swelling.  Gastrointestinal:  Negative for abdominal pain, constipation, diarrhea, nausea and vomiting.  Genitourinary:  Negative for dysuria, frequency and hematuria.  Musculoskeletal:  Negative for arthralgias and neck pain.  Skin:  Negative for rash.  Neurological:  Negative for dizziness, weakness, light-headedness, numbness and headaches.  Hematological:  Negative for adenopathy.  Psychiatric/Behavioral:  Negative for behavioral problems, dysphoric mood and sleep disturbance.    Per HPI unless specifically indicated above     Objective:    BP 128/62 (BP Location: Left Arm, Patient Position: Sitting, Cuff Size: Large)   Pulse 84   Temp (!) 96.9 F (36.1 C) (Temporal)   Wt 188 lb (85.3 kg)   SpO2 97%   BMI 34.39 kg/m   Wt Readings from Last 3 Encounters:  10/06/21 188 lb (85.3 kg)  08/04/21 191 lb (86.6 kg)  07/04/21 191 lb 9.6 oz (86.9 kg)    Physical Exam Vitals and nursing note reviewed.  Constitutional:      General: She is not in acute distress.    Appearance: She is well-developed. She is not diaphoretic.     Comments: Well-appearing, comfortable, cooperative  HENT:     Head:  Normocephalic and atraumatic.  Eyes:     General:        Right eye: No discharge.        Left eye: No discharge.     Conjunctiva/sclera: Conjunctivae normal.     Pupils: Pupils are equal, round, and reactive to light.  Neck:     Thyroid: No thyromegaly.  Cardiovascular:     Rate and Rhythm: Normal rate and regular rhythm.     Pulses: Normal pulses.     Heart sounds: Normal heart sounds. No murmur heard. Pulmonary:     Effort: Pulmonary effort is normal. No respiratory distress.     Breath sounds: Normal breath sounds. No wheezing or rales.  Abdominal:     General: Bowel sounds are normal. There is no distension.     Palpations: Abdomen is soft. There is no mass.     Tenderness: There is no abdominal tenderness.  Musculoskeletal:        General: No tenderness. Normal range of motion.     Cervical back: Normal range of motion and neck supple.     Comments: Upper / Lower Extremities: - Normal muscle tone, strength bilateral upper extremities 5/5, lower extremities 5/5  Lymphadenopathy:     Cervical: No cervical adenopathy.  Skin:    General: Skin is warm and dry.     Findings: No erythema or rash.     Comments: Large soft tissue asymmetry L upper mid back and L neck  Neurological:     Mental Status: She is alert and oriented to person, place, and time.     Comments: Distal sensation intact to light touch all extremities  Psychiatric:        Mood and Affect: Mood normal.        Behavior: Behavior normal.        Thought Content: Thought content normal.     Comments: Well groomed, good eye contact, normal speech and thoughts      Results for orders placed or performed in visit on 09/28/21  COMPLETE METABOLIC PANEL WITH GFR  Result Value Ref Range   Glucose, Bld 105 (H) 65 - 99 mg/dL   BUN 10 7 - 25 mg/dL   Creat 0.82 0.60 - 1.00 mg/dL   eGFR 76 > OR = 60 mL/min/1.56m   BUN/Creatinine Ratio NOT APPLICABLE 6 - 22 (calc)   Sodium 140 135 - 146 mmol/L   Potassium 4.3 3.5 -  5.3 mmol/L   Chloride 105 98 - 110 mmol/L   CO2 28 20 - 32 mmol/L   Calcium 9.0 8.6 - 10.4 mg/dL   Total Protein 6.5 6.1 - 8.1 g/dL   Albumin 4.0 3.6 - 5.1 g/dL   Globulin 2.5 1.9 - 3.7 g/dL (calc)   AG Ratio 1.6 1.0 - 2.5 (calc)   Total Bilirubin 0.5 0.2 - 1.2 mg/dL   Alkaline phosphatase (APISO) 106 37 - 153 U/L   AST 30 10 - 35 U/L   ALT 23 6 - 29 U/L  CBC with Differential/Platelet  Result Value Ref Range   WBC 6.9 3.8 - 10.8 Thousand/uL   RBC 4.51 3.80 - 5.10 Million/uL   Hemoglobin 12.9 11.7 - 15.5 g/dL   HCT 38.9 35.0 - 45.0 %   MCV 86.3 80.0 - 100.0 fL   MCH 28.6 27.0 - 33.0 pg   MCHC 33.2 32.0 - 36.0 g/dL   RDW 13.2 11.0 - 15.0 %   Platelets 268 140 - 400 Thousand/uL   MPV 9.1 7.5 - 12.5 fL   Neutro Abs 3,733 1,500 - 7,800 cells/uL   Lymphs Abs 2,574 850 - 3,900 cells/uL   Absolute Monocytes 580 200 - 950 cells/uL   Eosinophils Absolute 7 (L) 15 - 500 cells/uL   Basophils Absolute 7 0 - 200 cells/uL   Neutrophils Relative % 54.1 %   Total Lymphocyte 37.3 %   Monocytes Relative 8.4 %   Eosinophils Relative 0.1 %   Basophils Relative 0.1 %  Lipid panel  Result Value Ref Range   Cholesterol 181 <200 mg/dL   HDL 55 > OR = 50 mg/dL   Triglycerides 171 (H) <150 mg/dL   LDL Cholesterol (Calc) 99 mg/dL (calc)   Total CHOL/HDL Ratio 3.3 <5.0 (calc)   Non-HDL Cholesterol (Calc) 126 <130 mg/dL (calc)  Hemoglobin A1c  Result Value Ref Range   Hgb A1c MFr Bld 6.3 (H) <5.7 % of total Hgb   Mean Plasma Glucose 134 mg/dL   eAG (mmol/L) 7.4 mmol/L  TSH  Result Value Ref Range   TSH 1.70 0.40 - 4.50 mIU/L  Iron, TIBC and Ferritin Panel  Result Value Ref Range   Iron 65 45 - 160 mcg/dL   TIBC 296 250 - 450 mcg/dL (calc)   %SAT 22 16 - 45 % (calc)   Ferritin 55 16 - 288 ng/mL  Vitamin B12  Result Value Ref Range   Vitamin B-12 599 200 - 1,100 pg/mL      Assessment & Plan:   Problem List Items Addressed This Visit     Pre-diabetes   Mixed hyperlipidemia    Centrilobular emphysema (HCC)   Benign essential hypertension   B12 deficiency   Other Visit Diagnoses     Annual physical exam    -  Primary   Positive colorectal cancer screening using Cologuard test       Relevant Orders   Ambulatory referral to Gastroenterology   Encounter for screening mammogram for malignant neoplasm of breast       Relevant Orders  MM 3D SCREEN BREAST BILATERAL       Updated Health Maintenance information Reviewed recent lab results with patient Encouraged improvement to lifestyle with diet and exercise Goal of weight loss  Hold Symbicort and Spiriva and switch to sample Breztri 2 puffs twice day. If you like it we can order the rx for you  COPD may be causing fatigue and weakness  Ferritin was mild reduced but still normal. I am okay with this and believe that it is not in the range of needing iron infusion.  Likely the area on back and neck are lipomas that can grow and interfere but should not cause major issues.  For Mammogram screening for breast cancer   Orders Placed This Encounter  Procedures  . MM 3D SCREEN BREAST BILATERAL    Standing Status:   Future    Standing Expiration Date:   10/07/2022    Order Specific Question:   Reason for Exam (SYMPTOM  OR DIAGNOSIS REQUIRED)    Answer:   Screening bilateral 3D Mammogram [place]    Order Specific Question:   Preferred imaging location?    Answer:   [facility]  . Ambulatory referral to Gastroenterology    Referral Priority:   Routine    Referral Type:   Consultation    Referral Reason:   Specialty Services Required    Number of Visits Requested:   1     No orders of the defined types were placed in this encounter.     Follow up plan: Return in about 6 months (around 04/08/2022) for 6 month fasting lab only then 1 week later Follow-up COPD [facility] Iron/Ferritin.  Future CBC Fe Ferritin panel  A1c  Nobie Putnam, DO [facility] 10/06/2021, 10:31 AM

## 2021-10-09 ENCOUNTER — Other Ambulatory Visit: Payer: Self-pay

## 2021-10-09 ENCOUNTER — Telehealth: Payer: Self-pay

## 2021-10-09 DIAGNOSIS — R195 Other fecal abnormalities: Secondary | ICD-10-CM

## 2021-10-09 MED ORDER — GAVILYTE-C 240 G PO SOLR
ORAL | 0 refills | Status: DC
Start: 2021-10-09 — End: 2022-04-23

## 2021-10-09 NOTE — Telephone Encounter (Signed)
Gastroenterology Pre-Procedure Review  Request Date: 11/21/21 Requesting Physician: Dr. Vicente Males  PATIENT REVIEW QUESTIONS: The patient responded to the following health history questions as indicated:    1. Are you having any GI issues? yes (hiatal hernia, acid reflux takes prilosec) 2. Do you have a personal history of Polyps? no 3. Do you have a family history of Colon Cancer or Polyps? no 4. Diabetes Mellitus? no 5. Joint replacements in the past 12 months?no 6. Major health problems in the past 3 months?no 7. Any artificial heart valves, MVP, or defibrillator?no    MEDICATIONS & ALLERGIES:    Patient reports the following regarding taking any anticoagulation/antiplatelet therapy:   Plavix, Coumadin, Eliquis, Xarelto, Lovenox, Pradaxa, Brilinta, or Effient? no Aspirin? no  Patient confirms/reports the following medications:  Current Outpatient Medications  Medication Sig Dispense Refill   albuterol (PROVENTIL) (2.5 MG/3ML) 0.083% nebulizer solution USE 1 VIAL IN NEBULIZER EVERY 6 HOURS AS NEEDED FOR WHEEZING AND FOR SHORTNESS OF BREATH 225 mL 0   albuterol (VENTOLIN HFA) 108 (90 Base) MCG/ACT inhaler Inhale 2 puffs into the lungs every 6 (six) hours as needed for wheezing or shortness of breath. 8 g 5   amLODipine (NORVASC) 5 MG tablet Take 5 mg by mouth daily. (Patient not taking: Reported on 09/07/2021)     ARIPiprazole (ABILIFY) 5 MG tablet Take 1 tablet (5 mg total) by mouth daily. 90 tablet 3   budesonide-formoterol (SYMBICORT) 160-4.5 MCG/ACT inhaler Inhale 2 puffs into the lungs 2 (two) times daily.     cyanocobalamin (,VITAMIN B-12,) 1000 MCG/ML injection Inject 1 mL (1,000 mcg total) into the muscle every 30 (thirty) days. 3 mL 3   fluticasone (FLONASE) 50 MCG/ACT nasal spray Place 2 sprays into both nostrils daily.  1   furosemide (LASIX) 20 MG tablet Take 20 mg by mouth daily.  0   losartan (COZAAR) 50 MG tablet Take 50 mg by mouth daily.     magnesium oxide (MAG-OX) 400 MG  tablet Take by mouth.     Multiple Vitamin (MULTI-VITAMINS) TABS Take by mouth.     omeprazole (PRILOSEC) 20 MG capsule Take 1 capsule (20 mg total) by mouth daily before breakfast. 90 capsule 3   potassium chloride SA (KLOR-CON M) 20 MEQ tablet Take 20 mEq by mouth daily.     simvastatin (ZOCOR) 80 MG tablet Take 1 tablet (80 mg total) by mouth at bedtime. 90 tablet 3   tiotropium (SPIRIVA) 18 MCG inhalation capsule inhale contents of 1 capsule by mouth once daily     venlafaxine XR (EFFEXOR XR) 75 MG 24 hr capsule Take 3 capsules (225 mg total) by mouth daily with breakfast. 270 capsule 3   No current facility-administered medications for this visit.    Patient confirms/reports the following allergies:  Allergies  Allergen Reactions   Moxifloxacin Itching, Other (See Comments), Photosensitivity, Rash and Swelling    Causes swelling, redness, burning in eyes    Levofloxacin Other (See Comments)    Other reaction(s): Muscle Pain Other reaction(s): Muscle Pain   Amoxicillin-Pot Clavulanate Nausea Only and Other (See Comments)    Other Reaction: DIARRHEA. Other reaction(s): Other (See Comments) Other Reaction: DIARRHEA. Other Reaction: DIARRHEA.  Other reaction(s): Other (See Comments) Other Reaction: DIARRHEA.    No orders of the defined types were placed in this encounter.   AUTHORIZATION INFORMATION Primary Insurance: 1D#: Group #:  Secondary Insurance: 1D#: Group #:  SCHEDULE INFORMATION: Date: 11/21/21 Time: Location: [facility]

## 2021-10-18 ENCOUNTER — Other Ambulatory Visit: Payer: Self-pay

## 2021-10-20 ENCOUNTER — Encounter: Payer: Self-pay | Admitting: Gastroenterology

## 2021-10-23 ENCOUNTER — Telehealth: Payer: Self-pay

## 2021-10-23 ENCOUNTER — Telehealth: Payer: Self-pay | Admitting: Gastroenterology

## 2021-10-23 ENCOUNTER — Encounter: Admission: RE | Payer: Self-pay | Source: Home / Self Care

## 2021-10-23 ENCOUNTER — Ambulatory Visit: Admission: RE | Admit: 2021-10-23 | Payer: Medicare PPO | Source: Home / Self Care | Admitting: Gastroenterology

## 2021-10-23 HISTORY — DX: Centrilobular emphysema: J43.2

## 2021-10-23 HISTORY — DX: Solitary pulmonary nodule: R91.1

## 2021-10-23 HISTORY — DX: Hyperlipidemia, unspecified: E78.5

## 2021-10-23 SURGERY — COLONOSCOPY WITH PROPOFOL
Anesthesia: General

## 2021-10-23 NOTE — Telephone Encounter (Signed)
Elizabeth [name] in Endo has been notified that patient has canceled her today's colonoscopy with Dr. Vicente Males.  Thanks, [place], [place]

## 2021-10-23 NOTE — Telephone Encounter (Signed)
Pt called today to cancel colonoscopy for today

## 2021-10-31 DIAGNOSIS — R634 Abnormal weight loss: Secondary | ICD-10-CM | POA: Diagnosis not present

## 2021-10-31 DIAGNOSIS — Z7951 Long term (current) use of inhaled steroids: Secondary | ICD-10-CM | POA: Diagnosis not present

## 2021-10-31 DIAGNOSIS — J449 Chronic obstructive pulmonary disease, unspecified: Secondary | ICD-10-CM | POA: Diagnosis not present

## 2021-10-31 DIAGNOSIS — F17210 Nicotine dependence, cigarettes, uncomplicated: Secondary | ICD-10-CM | POA: Diagnosis not present

## 2021-10-31 DIAGNOSIS — G4733 Obstructive sleep apnea (adult) (pediatric): Secondary | ICD-10-CM | POA: Diagnosis not present

## 2021-10-31 DIAGNOSIS — R0602 Shortness of breath: Secondary | ICD-10-CM | POA: Diagnosis not present

## 2021-10-31 DIAGNOSIS — Z79899 Other long term (current) drug therapy: Secondary | ICD-10-CM | POA: Diagnosis not present

## 2021-11-07 ENCOUNTER — Ambulatory Visit
Admission: RE | Admit: 2021-11-07 | Discharge: 2021-11-07 | Disposition: A | Discharge status: Home / Self Care | Payer: Medicare PPO | Source: Ambulatory Visit | Attending: Family Medicine | Admitting: Family Medicine

## 2021-11-07 DIAGNOSIS — Z1231 Encounter for screening mammogram for malignant neoplasm of breast: Secondary | ICD-10-CM | POA: Diagnosis not present

## 2021-11-08 ENCOUNTER — Inpatient Hospital Stay
Admission: RE | Admit: 2021-11-08 | Discharge: 2021-11-08 | Disposition: A | Discharge status: Home / Self Care | Payer: Self-pay | Source: Ambulatory Visit | Attending: *Deleted | Admitting: *Deleted

## 2021-11-08 ENCOUNTER — Encounter: Payer: Self-pay | Admitting: Family Medicine

## 2021-11-08 ENCOUNTER — Other Ambulatory Visit: Payer: Self-pay | Admitting: *Deleted

## 2021-11-08 ENCOUNTER — Other Ambulatory Visit: Payer: Self-pay | Admitting: Internal Medicine

## 2021-11-08 DIAGNOSIS — K8020 Calculus of gallbladder without cholecystitis without obstruction: Secondary | ICD-10-CM | POA: Diagnosis not present

## 2021-11-08 DIAGNOSIS — Z1231 Encounter for screening mammogram for malignant neoplasm of breast: Secondary | ICD-10-CM

## 2021-11-08 DIAGNOSIS — R634 Abnormal weight loss: Secondary | ICD-10-CM | POA: Diagnosis not present

## 2021-11-08 DIAGNOSIS — K760 Fatty (change of) liver, not elsewhere classified: Secondary | ICD-10-CM | POA: Diagnosis not present

## 2021-11-21 ENCOUNTER — Ambulatory Visit: Payer: Self-pay | Admitting: *Deleted

## 2021-11-21 NOTE — Telephone Encounter (Signed)
Reason for Disposition  [1] Longstanding difficulty breathing (e.g., CHF, COPD, emphysema) AND [2] WORSE than normal  Answer Assessment - Initial Assessment Questions 1. RESPIRATORY STATUS: "Describe your breathing?" (e.g., wheezing, shortness of breath, unable to speak, severe coughing)      SOB, fever 2. ONSET: "When did this breathing problem begin?"      3rd time in 6 weeks- temperature- 101 3. PATTERN "Does the difficult breathing come and go, or has it been constant since it started?"      Constant- COPD- worse now 4. SEVERITY: "How bad is your breathing?" (e.g., mild, moderate, severe)    - MILD: No SOB at rest, mild SOB with walking, speaks normally in sentences, can lie down, no retractions, pulse < 100.    - MODERATE: SOB at rest, SOB with minimal exertion and prefers to sit, cannot lie down flat, speaks in phrases, mild retractions, audible wheezing, pulse 100-120.    - SEVERE: Very SOB at rest, speaks in single words, struggling to breathe, sitting hunched forward, retractions, pulse > 120      mild 5. RECURRENT SYMPTOM: "Have you had difficulty breathing before?" If Yes, ask: "When was the last time?" and "What happened that time?"      COPD 6. CARDIAC HISTORY: "Do you have any history of heart disease?" (e.g., heart attack, angina, bypass surgery, angioplasty)      Heart disease 7. LUNG HISTORY: "Do you have any history of lung disease?"  (e.g., pulmonary embolus, asthma, emphysema)     COPD 8. CAUSE: "What do you think is causing the breathing problem?"      Unsure- possible infection 9. OTHER SYMPTOMS: "Do you have any other symptoms? (e.g., dizziness, runny nose, cough, chest pain, fever)     Fever, cough, weakness 10. O2 SATURATION MONITOR:  "Do you use an oxygen saturation monitor (pulse oximeter) at home?" If Yes, ask: "What is your reading (oxygen level) today?" "What is your usual oxygen saturation reading?" (e.g., 95%)       Yes- uses O2 at night 2 liters, 96% 11.  PREGNANCY: "Is there any chance you are pregnant?" "When was your last menstrual period?"         12. TRAVEL: "Have you traveled out of the country in the last month?" (e.g., travel history, exposures)  Protocols used: Breathing Difficulty-A-AH

## 2021-11-21 NOTE — Telephone Encounter (Signed)
  Chief Complaint: COPD- SOB worse Symptoms: SOB,intermittent fever, cough Frequency: 6 weeks Pertinent Negatives: Patient denies   Disposition: [] ED /[] Urgent Care (no appt availability in office) / [x] Appointment(In office/virtual)/ []  [facility]/ [] Home Care/ [] Refused Recommended Disposition /[] [facility]/ []  Follow-up with PCP Additional Notes: Patient states she has had intermittent fever for 6 weeks, some worsening of COPD- SOB with exertion- inhalers are helping and no increased use of O2. Appointment scheduled.

## 2021-11-22 ENCOUNTER — Inpatient Hospital Stay: Admit: 2021-11-22 | Payer: Medicare PPO

## 2021-11-22 ENCOUNTER — Ambulatory Visit: Payer: Medicare PPO | Admitting: Physician Assistant

## 2021-11-22 ENCOUNTER — Encounter: Payer: Self-pay | Admitting: Physician Assistant

## 2021-11-22 ENCOUNTER — Ambulatory Visit
Admission: RE | Admit: 2021-11-22 | Discharge: 2021-11-22 | Disposition: A | Discharge status: Home / Self Care | Payer: Medicare PPO | Attending: Physician Assistant | Admitting: Physician Assistant

## 2021-11-22 ENCOUNTER — Ambulatory Visit
Admission: RE | Admit: 2021-11-22 | Discharge: 2021-11-22 | Disposition: A | Discharge status: Home / Self Care | Payer: Medicare PPO | Source: Ambulatory Visit | Attending: Physician Assistant | Admitting: Physician Assistant

## 2021-11-22 VITALS — BP 163/76 | HR 95 | Temp 98.6°F | Ht 62.0 in | Wt 186.6 lb

## 2021-11-22 DIAGNOSIS — R0602 Shortness of breath: Secondary | ICD-10-CM

## 2021-11-22 DIAGNOSIS — J069 Acute upper respiratory infection, unspecified: Secondary | ICD-10-CM

## 2021-11-22 DIAGNOSIS — R509 Fever, unspecified: Secondary | ICD-10-CM | POA: Diagnosis not present

## 2021-11-22 MED ORDER — DOXYCYCLINE HYCLATE 100 MG PO TABS: 100.0000 mg | ORAL_TABLET | Freq: Two times a day (BID) | ORAL | 0 refills | Status: DC

## 2021-11-22 NOTE — Patient Instructions (Signed)
I recommend the following  We will get a chest xray to assess your lungs and keep you updated on those results I am sending in a script for Doxycycline 100 mg to be take by mouth twice per day for 7 days I recommend adding Mucinex to your regimen and drinking plenty of water to thin out the mucus in your nose and airways You can continue the Tylenol and other over the counter medications you are taking  Please let us know if you have any further concerns or worsening symptoms

## 2021-11-22 NOTE — Progress Notes (Signed)
Established Patient Office Visit  Name: Elizabeth [name]   MRN: [id number]    DOB: Mar 09, 1951   Date:11/22/2021  Today's Provider: Talitha Givens, MHS, PA-C Introduced myself to the patient as a PA-C and provided education on APPs in clinical practice.         Subjective  Chief Complaint  Chief Complaint  Patient presents with   COPD   Sinusitis    HPI COPD exacerbation  Reports fevers and increased SOB for the past 6 weeks  Reports this started with a headache about 2 weeks ago  Fevers up to 101.5 started on Sunday- has been using Tylenol for this Reports some congestion and sinus pressure, reduced appetite States she has been drinking decaf tea - not much water Reports increased SOB since this started Is using rescue inhaler several times per day and neb once per day  - COVID test at home Denies recent sick contacts She has been using OTC decongestant- antihistamine combo    Patient Active Problem List   Diagnosis Date Noted   Pre-diabetes 10/06/2021   Malignant neoplasm of lower lobe of left lung ([place]) 07/04/2021   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 ([place]) 07/04/2021   Morbid obesity ([place]) 07/04/2021   OSA on CPAP 06/26/2019   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 03/11/2018   Senile nuclear sclerosis, left 03/11/2018   Substance abuse ([place])    Genetic testing    Pernicious anemia 11/27/2016   Normocytic anemia 10/16/2016   Iron deficiency anemia 10/16/2016   Centrilobular emphysema ([place])    Major depressive disorder, recurrent, moderate ([place]) 09/24/2016   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   Bilateral carpal tunnel syndrome 05/27/2015   Bilateral hand numbness 05/17/2015   Fracture of humerus, proximal, closed 03/23/2013   Lipoma of skin and subcutaneous tissue 12/26/2011   B12 deficiency 02/15/2011   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   Dyspepsia and other specified disorders of function of stomach 10/11/2010    Benign essential hypertension 10/11/2010   Mixed hyperlipidemia 10/11/2010   Tobacco use disorder 10/11/2010   Anxiety state 08/25/2009   Shortness of breath 07/24/2006    Past Surgical History:  Procedure Laterality Date   ABDOMINAL HYSTERECTOMY     BREAST EXCISIONAL BIOPSY Left    1980's neg   CESAREAN SECTION     TUBAL LIGATION      Family History  Problem Relation Age of Onset   Ovarian cancer Mother 44       deceased 2   Breast cancer Mother 88   Stroke Father    Lung cancer Paternal Aunt    Lung cancer Cousin     Social History   Tobacco Use   Smoking status: Every Day    Packs/day: 0.50    Years: 52.00    Total pack years: 26.00    Types: Cigarettes   Smokeless tobacco: Never   Tobacco comments:    5 ciggarette a day--03/02/2020  Substance Use Topics   Alcohol use: No     Current Outpatient Medications:    albuterol (PROVENTIL) (2.5 MG/3ML) 0.083% nebulizer solution, USE 1 VIAL IN NEBULIZER EVERY 6 HOURS AS NEEDED FOR WHEEZING AND FOR SHORTNESS OF BREATH, Disp: 225 mL, Rfl: 0   albuterol (VENTOLIN HFA) 108 (90 Base) MCG/ACT inhaler, Inhale 2 puffs into the lungs every 6 (six) hours as needed for wheezing or shortness of breath., Disp: 8 g, Rfl: 5   amLODipine (NORVASC)  5 MG tablet, Take 5 mg by mouth daily. (Patient not taking: Reported on 09/07/2021), Disp: , Rfl:    ARIPiprazole (ABILIFY) 5 MG tablet, Take 1 tablet (5 mg total) by mouth daily., Disp: 90 tablet, Rfl: 3   budesonide-formoterol (SYMBICORT) 160-4.5 MCG/ACT inhaler, Inhale 2 puffs into the lungs 2 (two) times daily., Disp: , Rfl:    cyanocobalamin (,VITAMIN B-12,) 1000 MCG/ML injection, Inject 1 mL (1,000 mcg total) into the muscle every 30 (thirty) days., Disp: 3 mL, Rfl: 3   fluticasone (FLONASE) 50 MCG/ACT nasal spray, Place 2 sprays into both nostrils daily., Disp: , Rfl: 1   furosemide (LASIX) 20 MG tablet, Take 20 mg by mouth daily., Disp: , Rfl: 0   losartan (COZAAR) 50 MG tablet, Take 50  mg by mouth daily., Disp: , Rfl:    magnesium oxide (MAG-OX) 400 MG tablet, Take by mouth., Disp: , Rfl:    Multiple Vitamin (MULTI-VITAMINS) TABS, Take by mouth., Disp: , Rfl:    omeprazole (PRILOSEC) 20 MG capsule, Take 1 capsule (20 mg total) by mouth daily before breakfast., Disp: 90 capsule, Rfl: 3   polyethylene glycol (GAVILYTE-C) 240 g solution, At 5pm the evening before colonoscopy fill Gavilyte container with Clear liquid to the fill line.  Mix well. Drink 8 oz every 20 mins until half has been completed.  Continue with Clear liquid diet.  5 hours prior to colonoscopy resume drinking the remaining prep solution.  Drinking 8 oz every 20 mins until entire contents have been completed.  Do not eat or drink anything 2 hours prior to colonoscopy., Disp: 4000 mL, Rfl: 0   potassium chloride SA (KLOR-CON M) 20 MEQ tablet, Take 20 mEq by mouth daily., Disp: , Rfl:    simvastatin (ZOCOR) 80 MG tablet, Take 1 tablet (80 mg total) by mouth at bedtime., Disp: 90 tablet, Rfl: 3   tiotropium (SPIRIVA) 18 MCG inhalation capsule, inhale contents of 1 capsule by mouth once daily, Disp: , Rfl:    venlafaxine XR (EFFEXOR XR) 75 MG 24 hr capsule, Take 3 capsules (225 mg total) by mouth daily with breakfast., Disp: 270 capsule, Rfl: 3  Allergies  Allergen Reactions   Moxifloxacin Itching, Other (See Comments), Photosensitivity, Rash and Swelling    Causes swelling, redness, burning in eyes    Levofloxacin Other (See Comments)    Other reaction(s): Muscle Pain Other reaction(s): Muscle Pain   Amoxicillin-Pot Clavulanate Nausea Only and Other (See Comments)    Other Reaction: DIARRHEA. Other reaction(s): Other (See Comments) Other Reaction: DIARRHEA. Other Reaction: DIARRHEA.  Other reaction(s): Other (See Comments) Other Reaction: DIARRHEA.    I personally reviewed active problem list, medication list, allergies, notes from last encounter, lab results with the patient/caregiver today.   Review of  Systems  Constitutional:  Positive for fever and malaise/fatigue.  HENT:  Positive for congestion.   Respiratory:  Positive for shortness of breath and wheezing. Negative for cough.   Cardiovascular:  Negative for chest pain, palpitations and leg swelling.      Objective  Vitals:   11/22/21 1039  BP: (!) 163/76  Pulse: 95  Temp: 98.6 F (37 C)  TempSrc: Oral  SpO2: 98%  Weight: 186 lb 9.6 oz (84.6 kg)  Height: _0  (1.575 m)    Body mass index is 34.13 kg/m.  Physical Exam Vitals reviewed.  Constitutional:      General: She is awake.     Appearance: Normal appearance. She is well-developed and well-groomed. She is obese.  HENT:     Head: Normocephalic and atraumatic.     Right Ear: Tympanic membrane, ear canal and external ear normal.     Left Ear: Tympanic membrane, ear canal and external ear normal.     Nose: Nose normal. No nasal tenderness, congestion or rhinorrhea.     Right Turbinates: Not enlarged, swollen or pale.     Left Turbinates: Not enlarged, swollen or pale.     Mouth/Throat:     Pharynx: Oropharynx is clear. Uvula midline. No oropharyngeal exudate, posterior oropharyngeal erythema or uvula swelling.  Eyes:     General: Lids are normal. Gaze aligned appropriately.     Extraocular Movements: Extraocular movements intact.     Conjunctiva/sclera: Conjunctivae normal.  Cardiovascular:     Rate and Rhythm: Normal rate and regular rhythm.     Pulses: Normal pulses.     Heart sounds: Normal heart sounds. No murmur heard.    No friction rub. No gallop.  Pulmonary:     Effort: Pulmonary effort is normal.     Breath sounds: Decreased air movement present. Examination of the right-middle field reveals decreased breath sounds. Examination of the right-lower field reveals decreased breath sounds. Examination of the left-lower field reveals decreased breath sounds. Decreased breath sounds present. No wheezing, rhonchi or rales.  Musculoskeletal:     Cervical  back: Normal range of motion and neck supple.  Neurological:     Mental Status: She is alert.  Psychiatric:        Behavior: Behavior is cooperative.      Recent Results (from the past 2160 hour(s))  Cologuard     Status: Abnormal   Collection Time: 09/14/21  3:38 PM  Result Value Ref Range   COLOGUARD Positive (A) Negative    Comment:  POSITIVE TEST RESULT. A positive Cologuard result should be followed with a colonoscopy or visual examination of the colon. The normal value (reference range) for this assay is negative.  TEST DESCRIPTION: Composite algorithmic analysis of stool DNA-biomarkers with hemoglobin immunoassay.   Quantitative values of individual biomarkers are not reportable and are not associated with individual biomarker result reference ranges. Cologuard is intended for colorectal cancer screening of adults of either sex, 30 years or older, who are at average-risk for colorectal cancer (CRC). Cologuard has been approved for use by the [place] FDA. The performance of Cologuard was established in a cross sectional study of average-risk adults aged 21-84. Cologuard performance in patients ages 27 to 67 years was estimated by sub-group analysis of near-age groups. Colonoscopies performed for a positive result may find as the most clinically significant lesion: colorectal cancer [4.0%], advanced adenoma  (including sessile serrated polyps greater than or equal to 1cm diameter) [20%] or non- advanced adenoma [31%]; or no colorectal neoplasia [45%]. These estimates are derived from a prospective cross-sectional screening study of 10,000 individuals at average risk for colorectal cancer who were screened with both Cologuard and colonoscopy. (Imperiale T. et al, Alison Stalling J Med 2014;370(14):1286-1297.) Cologuard may produce a false negative or false positive result (no colorectal cancer or precancerous polyp present at colonoscopy follow up). A negative Cologuard test result does not guarantee the  absence of CRC or advanced adenoma (pre-cancer). The current Cologuard screening interval is every 3 years. Paramedic and [employer] developer). Cologuard performance data in a 10,000 patient pivotal study using colonoscopy as the reference method can be accessed at the following location: www.exactl[employer].com/results. Additional description of the Cologuard test process,  warnings  and precautions can be found at www.cologuard.com.   COMPLETE METABOLIC PANEL WITH GFR     Status: Abnormal   Collection Time: 09/28/21  8:01 AM  Result Value Ref Range   Glucose, Bld 105 (H) 65 - 99 mg/dL    Comment: .            Fasting reference interval . For someone without known diabetes, a glucose value between 100 and 125 mg/dL is consistent with prediabetes and should be confirmed with a follow-up test. .    BUN 10 7 - 25 mg/dL   Creat 0.82 0.60 - 1.00 mg/dL   eGFR 76 > OR = 60 mL/min/1.11m    Comment: The eGFR is based on the CKD-EPI 2021 equation. To calculate  the new eGFR from a previous Creatinine or Cystatin C result, go to https://www.kidney.org/professionals/ kdoqi/gfr%5Fcalculator    BUN/Creatinine Ratio NOT APPLICABLE 6 - 22 (calc)   Sodium 140 135 - 146 mmol/L   Potassium 4.3 3.5 - 5.3 mmol/L   Chloride 105 98 - 110 mmol/L   CO2 28 20 - 32 mmol/L   Calcium 9.0 8.6 - 10.4 mg/dL   Total Protein 6.5 6.1 - 8.1 g/dL   Albumin 4.0 3.6 - 5.1 g/dL   Globulin 2.5 1.9 - 3.7 g/dL (calc)   AG Ratio 1.6 1.0 - 2.5 (calc)   Total Bilirubin 0.5 0.2 - 1.2 mg/dL   Alkaline phosphatase (APISO) 106 37 - 153 U/L   AST 30 10 - 35 U/L   ALT 23 6 - 29 U/L  CBC with Differential/Platelet     Status: Abnormal   Collection Time: 09/28/21  8:01 AM  Result Value Ref Range   WBC 6.9 3.8 - 10.8 Thousand/uL   RBC 4.51 3.80 - 5.10 Million/uL   Hemoglobin 12.9 11.7 - 15.5 g/dL   HCT 38.9 35.0 - 45.0 %   MCV 86.3 80.0 - 100.0 fL   MCH 28.6 27.0 - 33.0 pg   MCHC 33.2 32.0 - 36.0 g/dL    RDW 13.2 11.0 - 15.0 %   Platelets 268 140 - 400 Thousand/uL   MPV 9.1 7.5 - 12.5 fL   Neutro Abs 3,733 1,500 - 7,800 cells/uL   Lymphs Abs 2,574 850 - 3,900 cells/uL   Absolute Monocytes 580 200 - 950 cells/uL   Eosinophils Absolute 7 (L) 15 - 500 cells/uL   Basophils Absolute 7 0 - 200 cells/uL   Neutrophils Relative % 54.1 %   Total Lymphocyte 37.3 %   Monocytes Relative 8.4 %   Eosinophils Relative 0.1 %   Basophils Relative 0.1 %  Lipid panel     Status: Abnormal   Collection Time: 09/28/21  8:01 AM  Result Value Ref Range   Cholesterol 181 <200 mg/dL   HDL 55 > OR = 50 mg/dL   Triglycerides 171 (H) <150 mg/dL   LDL Cholesterol (Calc) 99 mg/dL (calc)    Comment: Reference range: <100 . Desirable range <100 mg/dL for primary prevention;   <70 mg/dL for patients with CHD or diabetic patients  with > or = 2 CHD risk factors. .Marland KitchenLDL-C is now calculated using the Martin-Hopkins  calculation, which is a validated novel method providing  better accuracy than the Friedewald equation in the  estimation of LDL-C.  MCresenciano Genreet al. JAnnamaria Helling 25625;638(93: 2061-2068  (http://education.[facility].com/faq/FAQ164)    Total CHOL/HDL Ratio 3.3 <5.0 (calc)   Non-HDL Cholesterol (Calc) 126 <130 mg/dL (calc)    Comment: For patients with  diabetes plus 1 major ASCVD risk  factor, treating to a non-HDL-C goal of <100 mg/dL  (LDL-C of <70 mg/dL) is considered a therapeutic  option.   Hemoglobin A1c     Status: Abnormal   Collection Time: 09/28/21  8:01 AM  Result Value Ref Range   Hgb A1c MFr Bld 6.3 (H) <5.7 % of total Hgb    Comment: For someone without known diabetes, a hemoglobin  A1c value between 5.7% and 6.4% is consistent with prediabetes and should be confirmed with a  follow-up test. . For someone with known diabetes, a value <7% indicates that their diabetes is well controlled. A1c targets should be individualized based on duration of diabetes, age, comorbid conditions,  and other considerations. . This assay result is consistent with an increased risk of diabetes. . Currently, no consensus exists regarding use of hemoglobin A1c for diagnosis of diabetes for children. .    Mean Plasma Glucose 134 mg/dL   eAG (mmol/L) 7.4 mmol/L  TSH     Status: None   Collection Time: 09/28/21  8:01 AM  Result Value Ref Range   TSH 1.70 0.40 - 4.50 mIU/L  Iron, TIBC and Ferritin Panel     Status: None   Collection Time: 09/28/21  8:01 AM  Result Value Ref Range   Iron 65 45 - 160 mcg/dL   TIBC 296 250 - 450 mcg/dL (calc)   %SAT 22 16 - 45 % (calc)   Ferritin 55 16 - 288 ng/mL  Vitamin B12     Status: None   Collection Time: 09/28/21  8:01 AM  Result Value Ref Range   Vitamin B-12 599 200 - 1,100 pg/mL     PHQ2/9:    10/06/2021   10:24 AM 08/04/2021   11:14 AM 07/04/2021    9:57 AM  Depression screen PHQ 2/9  Decreased Interest _0 Down, Depressed, Hopeless _1 PHQ - 2 Score _2 Altered sleeping 1 0 0  Tired, decreased energy _3 Change in appetite 0 1 1  Feeling bad or failure about yourself  0 0 0  Trouble concentrating _4 Moving slowly or fidgety/restless 1 0 0  Suicidal thoughts 0 0 0  PHQ-9 Score _5 Difficult doing work/chores Not difficult at all  Not difficult at all      Fall Risk:    10/06/2021   10:24 AM 09/07/2021    2:35 PM 08/04/2021   11:13 AM 07/04/2021    9:57 AM 06/26/2019   10:53 AM  Fall Risk   Falls in the past year? 0 0 0 0 0  Number falls in past yr: 0 0 0 0 0  Injury with Fall? 0 0 0 0 0  Risk for fall due to : History of fall(s) No Fall Risks No Fall Risks No Fall Risks   Follow up Falls evaluation completed Falls evaluation completed Falls evaluation completed Falls evaluation completed       Functional Status Survey:      Assessment & Plan  Problem List Items Addressed This Visit   None Visit Diagnoses     SOB (shortness of breath)    -  Primary See URI A&P   Relevant Medications    doxycycline (VIBRA-TABS) 100 MG tablet   Other Relevant Orders   DG Chest 2 View (Completed)   Upper respiratory tract infection, unspecified type     Acute, new  concern Reports coughing, congestion, increased SOB and fevers over the past few days States she has had a negative COVID test at home Concern for exacerbation of chronic lung disease given PE findings and elevated temps  Will get CXR to assess for acute illness- negative for acute pulmonary illness  Will start Doxycycline for sinusitis and potential pneumonia coverage given allergies Recommend continuing inhalers, neb treatments, OTC symptom relief. May add Mucinex and increase hydration efforts to assist with mucus expulsion Follow up as needed for persistent or progressing symptoms         No follow-ups on file.   I, Shavone Nevers E Jaicee Michelotti, PA-C, have reviewed all documentation for this visit. The documentation on 11/22/21 for the exam, diagnosis, procedures, and orders are all accurate and complete.   Talitha Givens, MHS, PA-C [facility]

## 2021-11-23 ENCOUNTER — Ambulatory Visit: Payer: Medicare PPO | Admitting: Physician Assistant

## 2021-11-29 ENCOUNTER — Encounter: Payer: Self-pay | Admitting: Family Medicine

## 2021-11-29 ENCOUNTER — Encounter: Payer: Self-pay | Admitting: Physician Assistant

## 2021-11-29 DIAGNOSIS — J0110 Acute frontal sinusitis, unspecified: Secondary | ICD-10-CM

## 2021-11-29 MED ORDER — AMOXICILLIN 500 MG PO CAPS: 500.0000 mg | ORAL_CAPSULE | Freq: Three times a day (TID) | ORAL | 0 refills | Status: AC

## 2021-11-29 NOTE — Telephone Encounter (Signed)
Duplicate message. 

## 2021-12-11 ENCOUNTER — Other Ambulatory Visit: Payer: Self-pay | Admitting: Family Medicine

## 2021-12-11 DIAGNOSIS — D509 Iron deficiency anemia, unspecified: Secondary | ICD-10-CM

## 2021-12-11 MED ORDER — TIOTROPIUM BROMIDE MONOHYDRATE 18 MCG IN CAPS: 18.0000 ug | ORAL_CAPSULE | Freq: Every day | RESPIRATORY_TRACT | 3 refills | Status: DC

## 2022-01-05 ENCOUNTER — Ambulatory Visit: Payer: Medicare PPO | Admitting: Family Medicine

## 2022-01-05 ENCOUNTER — Encounter: Payer: Self-pay | Admitting: Family Medicine

## 2022-01-05 VITALS — BP 140/68 | HR 97 | Ht 62.0 in | Wt 185.0 lb

## 2022-01-05 DIAGNOSIS — E538 Deficiency of other specified B group vitamins: Secondary | ICD-10-CM

## 2022-01-05 DIAGNOSIS — E782 Mixed hyperlipidemia: Secondary | ICD-10-CM

## 2022-01-05 DIAGNOSIS — R5383 Other fatigue: Secondary | ICD-10-CM | POA: Diagnosis not present

## 2022-01-05 DIAGNOSIS — E559 Vitamin D deficiency, unspecified: Secondary | ICD-10-CM

## 2022-01-05 DIAGNOSIS — Z23 Encounter for immunization: Secondary | ICD-10-CM

## 2022-01-05 DIAGNOSIS — E049 Nontoxic goiter, unspecified: Secondary | ICD-10-CM | POA: Diagnosis not present

## 2022-01-05 DIAGNOSIS — J432 Centrilobular emphysema: Secondary | ICD-10-CM

## 2022-01-05 DIAGNOSIS — R7303 Prediabetes: Secondary | ICD-10-CM | POA: Diagnosis not present

## 2022-01-05 DIAGNOSIS — D508 Other iron deficiency anemias: Secondary | ICD-10-CM

## 2022-01-05 DIAGNOSIS — D510 Vitamin B12 deficiency anemia due to intrinsic factor deficiency: Secondary | ICD-10-CM | POA: Diagnosis not present

## 2022-01-05 NOTE — Progress Notes (Signed)
Subjective:    Patient ID: Elizabeth [name], female    DOB: [birth year], 71 y.o.   MRN: [id number]  Elizabeth [name] is a 71 y.o. female presenting on 01/05/2022 for Fatigue  Here with daughter  HPI  Fatigue, Generalized  Thyroid Goiter, past diagnosis but no further treatment or complication asking about Thyroid labs No prior thyroid imaging before  Anemia Past history with pernicious anemia, required IV Iron infusion Due for labs Ferritin and vitamin testing  Last trend with Anemia panel -  Ferritin back in 2022 with 127, then down to 55 (08/2021) Prior history of iron infusions at [facility] Dr Grayland Ormond  History of Lung Cancer. She had radiation treatments.   During day feels very tired fatigued  Not sleepy or dozing off. Not waking up at night with insomnia  Additionally had URI ?COVID viral syndrome 1-2 month ago may have caused some lingering symptoms residually  Poor PO intake Weight loss over past 3+ months, 2-3 lbs down in past 3 months. Does not eat red meat, mostly eating fish and salads. Now smaller portions overall  Centrilobular Emphysema / COPD Followed by Pulm Using Symbicort, Spiriva, and Albuterol PRN.    Lung Cancer LLL, in remission S/p Radiation therapy 3 years+ ago, last scan 09/2020   OSA, on CPAP + Oxygen nightly   Major Depression, recurrent moderate Stable chronic issue, on medication Not attributing to depression Some deaths in family but she feels like not mood related   Pernicious Anemia B12 Deficiency Labs reviewed      01/05/2022   10:57 AM 10/06/2021   10:24 AM 08/04/2021   11:14 AM  Depression screen PHQ 2/9  Decreased Interest '1 1 1  ' Down, Depressed, Hopeless '1 1 1  ' PHQ - 2 Score '2 2 2  ' Altered sleeping 1 1 0  Tired, decreased energy '3 3 3  ' Change in appetite 2 0 1  Feeling bad or failure about yourself  0 0 0  Trouble concentrating '1 1 1  ' Moving slowly or fidgety/restless 0 1 0  Suicidal thoughts 0 0 0  PHQ-9 Score '9 8  7  ' Difficult doing work/chores Not difficult at all Not difficult at all     Social History   Tobacco Use   Smoking status: Every Day    Packs/day: 0.50    Years: 52.00    Total pack years: 26.00    Types: Cigarettes   Smokeless tobacco: Never   Tobacco comments:    5 ciggarette a day--03/02/2020  Vaping Use   Vaping Use: Former  Substance Use Topics   Alcohol use: No   Drug use: No    Review of Systems Per HPI unless specifically indicated above     Objective:    BP (!) 140/68   Pulse 97   Ht '5\' 2"'  (1.575 m)   Wt 185 lb (83.9 kg)   SpO2 95%   BMI 33.84 kg/m   Wt Readings from Last 3 Encounters:  01/05/22 185 lb (83.9 kg)  11/22/21 186 lb 9.6 oz (84.6 kg)  10/06/21 188 lb (85.3 kg)    Physical Exam Vitals and nursing note reviewed.  Constitutional:      General: She is not in acute distress.    Appearance: She is well-developed. She is not diaphoretic.     Comments: Well-appearing, comfortable, cooperative  HENT:     Head: Normocephalic and atraumatic.  Eyes:     General:        Right eye:  No discharge.        Left eye: No discharge.     Conjunctiva/sclera: Conjunctivae normal.  Neck:     Thyroid: No thyromegaly.  Cardiovascular:     Rate and Rhythm: Normal rate and regular rhythm.     Heart sounds: Normal heart sounds. No murmur heard. Pulmonary:     Effort: Pulmonary effort is normal. No respiratory distress.     Breath sounds: Normal breath sounds. No wheezing or rales.  Musculoskeletal:        General: Normal range of motion.     Cervical back: Normal range of motion and neck supple.  Lymphadenopathy:     Cervical: No cervical adenopathy.  Skin:    General: Skin is warm and dry.     Findings: No erythema or rash.  Neurological:     Mental Status: She is alert and oriented to person, place, and time.  Psychiatric:        Behavior: Behavior normal.     Comments: Well groomed, good eye contact, normal speech and thoughts    Results for  orders placed or performed in visit on 09/28/21  COMPLETE METABOLIC PANEL WITH GFR  Result Value Ref Range   Glucose, Bld 105 (H) 65 - 99 mg/dL   BUN 10 7 - 25 mg/dL   Creat 0.82 0.60 - 1.00 mg/dL   eGFR 76 > OR = 60 mL/min/1.76m   BUN/Creatinine Ratio NOT APPLICABLE 6 - 22 (calc)   Sodium 140 135 - 146 mmol/L   Potassium 4.3 3.5 - 5.3 mmol/L   Chloride 105 98 - 110 mmol/L   CO2 28 20 - 32 mmol/L   Calcium 9.0 8.6 - 10.4 mg/dL   Total Protein 6.5 6.1 - 8.1 g/dL   Albumin 4.0 3.6 - 5.1 g/dL   Globulin 2.5 1.9 - 3.7 g/dL (calc)   AG Ratio 1.6 1.0 - 2.5 (calc)   Total Bilirubin 0.5 0.2 - 1.2 mg/dL   Alkaline phosphatase (APISO) 106 37 - 153 U/L   AST 30 10 - 35 U/L   ALT 23 6 - 29 U/L  CBC with Differential/Platelet  Result Value Ref Range   WBC 6.9 3.8 - 10.8 Thousand/uL   RBC 4.51 3.80 - 5.10 Million/uL   Hemoglobin 12.9 11.7 - 15.5 g/dL   HCT 38.9 35.0 - 45.0 %   MCV 86.3 80.0 - 100.0 fL   MCH 28.6 27.0 - 33.0 pg   MCHC 33.2 32.0 - 36.0 g/dL   RDW 13.2 11.0 - 15.0 %   Platelets 268 140 - 400 Thousand/uL   MPV 9.1 7.5 - 12.5 fL   Neutro Abs 3,733 1,500 - 7,800 cells/uL   Lymphs Abs 2,574 850 - 3,900 cells/uL   Absolute Monocytes 580 200 - 950 cells/uL   Eosinophils Absolute 7 (L) 15 - 500 cells/uL   Basophils Absolute 7 0 - 200 cells/uL   Neutrophils Relative % 54.1 %   Total Lymphocyte 37.3 %   Monocytes Relative 8.4 %   Eosinophils Relative 0.1 %   Basophils Relative 0.1 %  Lipid panel  Result Value Ref Range   Cholesterol 181 <200 mg/dL   HDL 55 > OR = 50 mg/dL   Triglycerides 171 (H) <150 mg/dL   LDL Cholesterol (Calc) 99 mg/dL (calc)   Total CHOL/HDL Ratio 3.3 <5.0 (calc)   Non-HDL Cholesterol (Calc) 126 <130 mg/dL (calc)  Hemoglobin A1c  Result Value Ref Range   Hgb A1c MFr Bld 6.3 (H) <5.7 %  of total Hgb   Mean Plasma Glucose 134 mg/dL   eAG (mmol/L) 7.4 mmol/L  TSH  Result Value Ref Range   TSH 1.70 0.40 - 4.50 mIU/L  Iron, TIBC and Ferritin Panel   Result Value Ref Range   Iron 65 45 - 160 mcg/dL   TIBC 296 250 - 450 mcg/dL (calc)   %SAT 22 16 - 45 % (calc)   Ferritin 55 16 - 288 ng/mL  Vitamin B12  Result Value Ref Range   Vitamin B-12 599 200 - 1,100 pg/mL      Assessment & Plan:   Problem List Items Addressed This Visit     B12 deficiency   Relevant Orders   Vitamin B12   Centrilobular emphysema (HCC)   Iron deficiency anemia - Primary   Relevant Orders   COMPLETE METABOLIC PANEL WITH GFR   CBC with Differential/Platelet   Iron, TIBC and Ferritin Panel   Mixed hyperlipidemia   Relevant Orders   TSH   T4, free   Pernicious anemia   Relevant Orders   Iron, TIBC and Ferritin Panel   Vitamin B12   Pre-diabetes   Relevant Orders   Hemoglobin A1c   Other Visit Diagnoses     Needs flu shot       Relevant Orders   Flu Vaccine QUAD High Dose(Fluad) (Completed)   Vitamin D deficiency       Relevant Orders   VITAMIN D 25 Hydroxy (Vit-D Deficiency, Fractures)   Other fatigue       Relevant Orders   COMPLETE METABOLIC PANEL WITH GFR   CBC with Differential/Platelet   Thyroid goiter       Relevant Orders   TSH   T4, free       Flu Shot today  Fatigue, unspecified Multifactorial etiology likely for her fatigue Pending extensive repeat lab work today Rule out anemia, notice downtrending Ferritin, she has had prior iron infusion in the past followed by [facility] Dr Grayland Ormond, may pursue consult if still low and symptomatic. Vitamin testing as well today History of Thyroid Goiter, without nodularity, will repeat thyroid panel  Additionally discussed nutritional etiology for fatigue, if poor PO intake, trying to focus on cause of her fatigue, may need boost / ensure meal supplement to maintain better nutrition  Sleep does not seem to be issue, less likely sleepiness  Not suggestive of sleep or mood related cause of fatigue.   Orders Placed This Encounter  Procedures   Flu Vaccine QUAD High Dose(Fluad)    COMPLETE METABOLIC PANEL WITH GFR   CBC with Differential/Platelet   Hemoglobin A1c   TSH   T4, free   Iron, TIBC and Ferritin Panel   Vitamin B12   VITAMIN D 25 Hydroxy (Vit-D Deficiency, Fractures)     No orders of the defined types were placed in this encounter.     Follow up plan: Return if symptoms worsen or fail to improve.   Nobie Putnam, [facility] 01/05/2022, 10:43 AM

## 2022-01-05 NOTE — Patient Instructions (Addendum)
   Please schedule a Follow-up Appointment to: Return if symptoms worsen or fail to improve.  If you have any other questions or concerns, please feel free to call the office or send a message through [place]. You may also schedule an earlier appointment if necessary.  Additionally, you may be receiving a survey about your experience at our office within a few days to 1 week by e-mail or mail. We value your feedback.  Nobie Putnam, DO [place]

## 2022-01-08 ENCOUNTER — Encounter: Payer: Self-pay | Admitting: Family Medicine

## 2022-01-09 ENCOUNTER — Ambulatory Visit: Payer: Medicare PPO | Admitting: Family Medicine

## 2022-01-09 DIAGNOSIS — R0602 Shortness of breath: Secondary | ICD-10-CM | POA: Diagnosis not present

## 2022-01-09 DIAGNOSIS — I10 Essential (primary) hypertension: Secondary | ICD-10-CM | POA: Diagnosis not present

## 2022-01-09 DIAGNOSIS — R9431 Abnormal electrocardiogram [ECG] [EKG]: Secondary | ICD-10-CM | POA: Diagnosis not present

## 2022-01-09 DIAGNOSIS — G4736 Sleep related hypoventilation in conditions classified elsewhere: Secondary | ICD-10-CM | POA: Diagnosis not present

## 2022-01-09 DIAGNOSIS — J431 Panlobular emphysema: Secondary | ICD-10-CM | POA: Diagnosis not present

## 2022-01-09 DIAGNOSIS — J4489 Other specified chronic obstructive pulmonary disease: Secondary | ICD-10-CM | POA: Diagnosis not present

## 2022-01-09 DIAGNOSIS — G4733 Obstructive sleep apnea (adult) (pediatric): Secondary | ICD-10-CM | POA: Diagnosis not present

## 2022-01-09 DIAGNOSIS — E782 Mixed hyperlipidemia: Secondary | ICD-10-CM | POA: Diagnosis not present

## 2022-01-09 LAB — HEMOGLOBIN A1C
Hgb A1c MFr Bld: 6.2 % of total Hgb — ABNORMAL HIGH (ref <5.7)
Mean Plasma Glucose: 131 mg/dL
eAG (mmol/L): 7.3 mmol/L

## 2022-01-09 LAB — COMPLETE METABOLIC PANEL WITH GFR
AG Ratio: 1.8 (calc) (ref 1.0–2.5)
ALT: 25 U/L (ref 6–29)
AST: 37 U/L — ABNORMAL HIGH (ref 10–35)
Albumin: 4.5 g/dL (ref 3.6–5.1)
Alkaline phosphatase (APISO): 105 U/L (ref 37–153)
BUN: 10 mg/dL (ref 7–25)
CO2: 28 mmol/L (ref 20–32)
Calcium: 9.5 mg/dL (ref 8.6–10.4)
Chloride: 101 mmol/L (ref 98–110)
Creat: 0.85 mg/dL (ref 0.60–1.00)
Globulin: 2.5 g/dL (calc) (ref 1.9–3.7)
Glucose, Bld: 96 mg/dL (ref 65–99)
Potassium: 4.3 mmol/L (ref 3.5–5.3)
Sodium: 140 mmol/L (ref 135–146)
Total Bilirubin: 0.6 mg/dL (ref 0.2–1.2)
Total Protein: 7 g/dL (ref 6.1–8.1)
eGFR: 73 mL/min/{1.73_m2} (ref >=60)

## 2022-01-09 LAB — CBC WITH DIFFERENTIAL/PLATELET
Absolute Monocytes: 468 cells/uL (ref 200–950)
Basophils Absolute: 7 cells/uL (ref 0–200)
Basophils Relative: 0.1 %
Eosinophils Absolute: 7 cells/uL — ABNORMAL LOW (ref 15–500)
Eosinophils Relative: 0.1 %
HCT: 40.7 % (ref 35.0–45.0)
Hemoglobin: 13.2 g/dL (ref 11.7–15.5)
Lymphs Abs: 2196 cells/uL (ref 850–3900)
MCH: 28.6 pg (ref 27.0–33.0)
MCHC: 32.4 g/dL (ref 32.0–36.0)
MCV: 88.1 fL (ref 80.0–100.0)
MPV: 10.1 fL (ref 7.5–12.5)
Monocytes Relative: 6.5 %
Neutro Abs: 4522 cells/uL (ref 1500–7800)
Neutrophils Relative %: 62.8 %
Platelets: 313 10*3/uL (ref 140–400)
RBC: 4.62 10*6/uL (ref 3.80–5.10)
RDW: 13.4 % (ref 11.0–15.0)
Total Lymphocyte: 30.5 %
WBC: 7.2 10*3/uL (ref 3.8–10.8)

## 2022-01-09 LAB — T4, FREE: Free T4: 1.1 ng/dL (ref 0.8–1.8)

## 2022-01-09 LAB — VITAMIN D 25 HYDROXY (VIT D DEFICIENCY, FRACTURES): Vit D, 25-Hydroxy: 37 ng/mL (ref 30–100)

## 2022-01-09 LAB — IRON,TIBC AND FERRITIN PANEL
%SAT: 35 % (calc) (ref 16–45)
Ferritin: 58 ng/mL (ref 16–288)
Iron: 116 ug/dL (ref 45–160)
TIBC: 327 mcg/dL (calc) (ref 250–450)

## 2022-01-09 LAB — TSH: TSH: 1.11 mIU/L (ref 0.40–4.50)

## 2022-01-09 LAB — VITAMIN B12: Vitamin B-12: 692 pg/mL (ref 200–1100)

## 2022-02-05 DIAGNOSIS — R0602 Shortness of breath: Secondary | ICD-10-CM | POA: Diagnosis not present

## 2022-02-12 ENCOUNTER — Other Ambulatory Visit: Payer: Self-pay

## 2022-02-12 DIAGNOSIS — F331 Major depressive disorder, recurrent, moderate: Secondary | ICD-10-CM

## 2022-02-12 MED ORDER — ARIPIPRAZOLE 5 MG PO TABS: 5.0000 mg | ORAL_TABLET | Freq: Every day | ORAL | 3 refills | Status: DC

## 2022-02-21 ENCOUNTER — Telehealth: Payer: Medicare PPO | Admitting: Physician Assistant

## 2022-02-21 DIAGNOSIS — J441 Chronic obstructive pulmonary disease with (acute) exacerbation: Secondary | ICD-10-CM | POA: Diagnosis not present

## 2022-02-21 MED ORDER — DOXYCYCLINE HYCLATE 100 MG PO CAPS: 100.0000 mg | ORAL_CAPSULE | Freq: Two times a day (BID) | ORAL | 0 refills | Status: DC

## 2022-02-21 NOTE — Progress Notes (Signed)
Virtual Visit Consent   Elizabeth [name], you are scheduled for a virtual visit with a [facility] provider today. Just as with appointments in the office, your consent must be obtained to participate. Your consent will be active for this visit and any virtual visit you may have with one of our providers in the next 365 days. If you have a MyChart account, a copy of this consent can be sent to you electronically.  As this is a virtual visit, video technology does not allow for your provider to perform a traditional examination. This may limit your provider's ability to fully assess your condition. If your provider identifies any concerns that need to be evaluated in person or the need to arrange testing (such as labs, EKG, etc.), we will make arrangements to do so. Although advances in technology are sophisticated, we cannot ensure that it will always work on either your end or our end. If the connection with a video visit is poor, the visit may have to be switched to a telephone visit. With either a video or telephone visit, we are not always able to ensure that we have a secure connection.  By engaging in this virtual visit, you consent to the provision of healthcare and authorize for your insurance to be billed (if applicable) for the services provided during this visit. Depending on your insurance coverage, you may receive a charge related to this service.  I need to obtain your verbal consent now. Are you willing to proceed with your visit today? [name] has provided verbal consent on 02/21/2022 for a virtual visit (video or telephone). Mar Daring, PA-C  Date: 02/21/2022 8:24 AM  Virtual Visit via Video Note   I, Mar Daring, connected with  Elizabeth [name]  ([id number], September 26, 1950) on 02/21/22 at  8:15 AM EST by a video-enabled telemedicine application and verified that I am speaking with the correct person using two identifiers.  Location: Patient: Virtual Visit Location  Patient: Home Provider: Virtual Visit Location Provider: Home Office   I discussed the limitations of evaluation and management by telemedicine and the availability of in person appointments. The patient expressed understanding and agreed to proceed.    History of Present Illness: [name] is a 71 y.o. who identifies as a female who was assigned female at birth, and is being seen today for possible COPD exacerbation/acute on chronic cough.  HPI: Cough This is a recurrent problem. The current episode started in the past 7 days. The problem has been gradually worsening. The problem occurs every few minutes. The cough is Productive of sputum and productive of brown sputum. Associated symptoms include chest pain (tightness), chills (during the night), a fever (100), headaches, myalgias, nasal congestion, postnasal drip, rhinorrhea, a sore throat (initial symptom), shortness of breath, sweats and wheezing. Pertinent negatives include no ear congestion or ear pain. The symptoms are aggravated by lying down. Risk factors for lung disease include smoking/tobacco exposure (sick contacts). She has tried a beta-agonist inhaler, leukotriene antagonists, ipratropium inhaler and steroid inhaler (antihistamine with decongestant and tylenol) for the symptoms. The treatment provided no relief. Her past medical history is significant for bronchitis, COPD and pneumonia. 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/2018   Substance abuse ([place])    Genetic testing    Pernicious anemia 11/27/2016   Normocytic anemia 10/16/2016   Iron deficiency anemia  10/16/2016   Centrilobular emphysema ([place])    Major depressive disorder, recurrent, moderate ([place]) 09/24/2016   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   Bilateral carpal tunnel syndrome 05/27/2015   Bilateral hand numbness 05/17/2015   Fracture of humerus, proximal, closed 03/23/2013   Lipoma of skin and subcutaneous tissue 12/26/2011   B12 deficiency 02/15/2011   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   Dyspepsia and other specified disorders of function of stomach 10/11/2010   Benign essential hypertension 10/11/2010   Mixed hyperlipidemia 10/11/2010   Tobacco use disorder 10/11/2010   Anxiety state 08/25/2009   Shortness of breath 07/24/2006    Allergies:  Allergies  Allergen Reactions   Moxifloxacin Itching, Other (See Comments), Photosensitivity, Rash and Swelling    Causes swelling, redness, burning in eyes    Levofloxacin Other (See Comments)    Other reaction(s): Muscle Pain Other reaction(s): Muscle Pain   Amoxicillin-Pot Clavulanate Nausea Only and Other (See Comments)    Other Reaction: DIARRHEA. Other reaction(s): Other (See Comments) Other Reaction: DIARRHEA. Other Reaction: DIARRHEA.  Other reaction(s): Other (See Comments) Other Reaction: DIARRHEA.   Medications:  Current Outpatient Medications:    doxycycline (VIBRAMYCIN) 100 MG capsule, Take 1 capsule (100 mg total) by mouth 2 (two) times daily., Disp: 20 capsule, Rfl: 0   albuterol (PROVENTIL) (2.5 MG/3ML) 0.083% nebulizer solution, USE 1 VIAL IN NEBULIZER EVERY 6 HOURS AS NEEDED FOR WHEEZING AND FOR SHORTNESS OF BREATH, Disp: 225 mL, Rfl: 0   albuterol (VENTOLIN HFA) 108 (90 Base) MCG/ACT inhaler, Inhale 2 puffs into the lungs every 6 (six) hours as needed for wheezing or shortness of breath., Disp: 8 g, Rfl: 5   ARIPiprazole (ABILIFY) 5 MG tablet, Take 1 tablet (5 mg total) by mouth daily., Disp: 90 tablet, Rfl: 3   budesonide-formoterol (SYMBICORT) 160-4.5 MCG/ACT inhaler, Inhale 2 puffs into the lungs 2  (two) times daily., Disp: , Rfl:    cyanocobalamin (,VITAMIN B-12,) 1000 MCG/ML injection, Inject 1 mL (1,000 mcg total) into the muscle every 30 (thirty) days., Disp: 3 mL, Rfl: 3   fluticasone (FLONASE) 50 MCG/ACT nasal spray, Place 2 sprays into both nostrils daily., Disp: , Rfl: 1   furosemide (LASIX) 20 MG tablet, Take 20 mg by mouth daily., Disp: , Rfl: 0   losartan (COZAAR) 50 MG tablet, Take 50 mg by mouth daily., Disp: , Rfl:    magnesium oxide (MAG-OX) 400 MG tablet, Take by mouth., Disp: , Rfl:    Multiple Vitamin (MULTI-VITAMINS) TABS, Take by mouth., Disp: , Rfl:    omeprazole (PRILOSEC) 20 MG capsule, Take 1 capsule (20 mg total) by mouth daily before breakfast., Disp: 90 capsule, Rfl: 3   polyethylene glycol (GAVILYTE-C) 240 g solution, At 5pm the evening before colonoscopy fill Gavilyte container with Clear liquid to the fill line.  Mix well. Drink 8 oz every 20 mins until half has been completed.  Continue with Clear liquid diet.  5 hours prior to colonoscopy resume drinking the remaining prep solution.  Drinking 8 oz every 20 mins until entire contents have been completed.  Do not eat or drink anything 2 hours prior to colonoscopy., Disp: 4000 mL, Rfl: 0   potassium chloride SA (KLOR-CON M) 20 MEQ tablet, Take 20 mEq by mouth  daily., Disp: , Rfl:    simvastatin (ZOCOR) 80 MG tablet, Take 1 tablet (80 mg total) by mouth at bedtime., Disp: 90 tablet, Rfl: 3   tiotropium (SPIRIVA) 18 MCG inhalation capsule, Place 1 capsule (18 mcg total) into inhaler and inhale daily., Disp: 90 capsule, Rfl: 3   venlafaxine XR (EFFEXOR XR) 75 MG 24 hr capsule, Take 3 capsules (225 mg total) by mouth daily with breakfast., Disp: 270 capsule, Rfl: 3  Observations/Objective: Patient is well-developed, well-nourished in no acute distress.  Resting comfortably at home.  Head is normocephalic, atraumatic.  No labored breathing.  Speech is clear and coherent with logical content.  Patient is alert and  oriented at baseline.    Assessment and Plan: 1. COPD with acute exacerbation (HCC) - doxycycline (VIBRAMYCIN) 100 MG capsule; Take 1 capsule (100 mg total) by mouth 2 (two) times daily.  Dispense: 20 capsule; Refill: 0  - Worsening over a week despite OTC medications - Will treat with Doxycycline - Can continue Mucinex  - Push fluids.  - Rest.  - Steam and humidifier can help - Seek in person evaluation if worsening or symptoms fail to improve    Follow Up Instructions: I discussed the assessment and treatment plan with the patient. The patient was provided an opportunity to ask questions and all were answered. The patient agreed with the plan and demonstrated an understanding of the instructions.  A copy of instructions were sent to the patient via MyChart unless otherwise noted below.    The patient was advised to call back or seek an in-person evaluation if the symptoms worsen or if the condition fails to improve as anticipated.  Time:  I spent 11 minutes with the patient via telehealth technology discussing the above problems/concerns.    Mar Daring, PA-C

## 2022-02-21 NOTE — Patient Instructions (Signed)
[name], thank you for joining Mar Daring, PA-C for today's virtual visit.  While this provider is not your primary care provider (PCP), if your PCP is located in our provider database this encounter information will be shared with them immediately following your visit.   [facility] account gives you access to today's visit and all your visits, tests, and labs performed at [facility] " click here if you don't have a [facility] account or go to [email or web address]  Consent: (Patient) Elizabeth [name] provided verbal consent for this virtual visit at the beginning of the encounter.  Current Medications:  Current Outpatient Medications:    doxycycline (VIBRAMYCIN) 100 MG capsule, Take 1 capsule (100 mg total) by mouth 2 (two) times daily., Disp: 20 capsule, Rfl: 0   albuterol (PROVENTIL) (2.5 MG/3ML) 0.083% nebulizer solution, USE 1 VIAL IN NEBULIZER EVERY 6 HOURS AS NEEDED FOR WHEEZING AND FOR SHORTNESS OF BREATH, Disp: 225 mL, Rfl: 0   albuterol (VENTOLIN HFA) 108 (90 Base) MCG/ACT inhaler, Inhale 2 puffs into the lungs every 6 (six) hours as needed for wheezing or shortness of breath., Disp: 8 g, Rfl: 5   ARIPiprazole (ABILIFY) 5 MG tablet, Take 1 tablet (5 mg total) by mouth daily., Disp: 90 tablet, Rfl: 3   budesonide-formoterol (SYMBICORT) 160-4.5 MCG/ACT inhaler, Inhale 2 puffs into the lungs 2 (two) times daily., Disp: , Rfl:    cyanocobalamin (,VITAMIN B-12,) 1000 MCG/ML injection, Inject 1 mL (1,000 mcg total) into the muscle every 30 (thirty) days., Disp: 3 mL, Rfl: 3   fluticasone (FLONASE) 50 MCG/ACT nasal spray, Place 2 sprays into both nostrils daily., Disp: , Rfl: 1   furosemide (LASIX) 20 MG tablet, Take 20 mg by mouth daily., Disp: , Rfl: 0   losartan (COZAAR) 50 MG tablet, Take 50 mg by mouth daily., Disp: , Rfl:    magnesium oxide (MAG-OX) 400 MG tablet, Take by mouth., Disp: , Rfl:    Multiple Vitamin (MULTI-VITAMINS) TABS,  Take by mouth., Disp: , Rfl:    omeprazole (PRILOSEC) 20 MG capsule, Take 1 capsule (20 mg total) by mouth daily before breakfast., Disp: 90 capsule, Rfl: 3   polyethylene glycol (GAVILYTE-C) 240 g solution, At 5pm the evening before colonoscopy fill Gavilyte container with Clear liquid to the fill line.  Mix well. Drink 8 oz every 20 mins until half has been completed.  Continue with Clear liquid diet.  5 hours prior to colonoscopy resume drinking the remaining prep solution.  Drinking 8 oz every 20 mins until entire contents have been completed.  Do not eat or drink anything 2 hours prior to colonoscopy., Disp: 4000 mL, Rfl: 0   potassium chloride SA (KLOR-CON M) 20 MEQ tablet, Take 20 mEq by mouth daily., Disp: , Rfl:    simvastatin (ZOCOR) 80 MG tablet, Take 1 tablet (80 mg total) by mouth at bedtime., Disp: 90 tablet, Rfl: 3   tiotropium (SPIRIVA) 18 MCG inhalation capsule, Place 1 capsule (18 mcg total) into inhaler and inhale daily., Disp: 90 capsule, Rfl: 3   venlafaxine XR (EFFEXOR XR) 75 MG 24 hr capsule, Take 3 capsules (225 mg total) by mouth daily with breakfast., Disp: 270 capsule, Rfl: 3   Medications ordered in this encounter:  Meds ordered this encounter  Medications   doxycycline (VIBRAMYCIN) 100 MG capsule    Sig: Take 1 capsule (100 mg total) by mouth 2 (two) times daily.    Dispense:  20 capsule    Refill:  0    Order Specific Question:   Supervising Provider    Answer:   Chase Picket [[id number]]     *If you need refills on other medications prior to your next appointment, please contact your pharmacy*  Follow-Up: Call back or seek an in-person evaluation if the symptoms worsen or if the condition fails to improve as anticipated.  [place] [phone]  Other Instructions Chronic Obstructive Pulmonary Disease Exacerbation  Chronic obstructive pulmonary disease (COPD) is a long-term (chronic) lung problem. In COPD, the flow of air from the lungs  is limited. COPD exacerbations are times that breathing gets worse and you need more than your normal treatment. Without treatment, they can be life-threatening. If they happen often, your lungs can become more damaged. What are the causes? Having infections that affect your airways and lungs. Being exposed to: Smoke. Air pollution. Chemical fumes. Dust. Things that can cause an allergic reaction (allergens). Not taking your usual COPD medicines as told. Having medical problems already, such as heart failure or infections not involving the lungs. In many cases, the cause is not known. What increases the risk? Smoking. Being an older adult. Having frequent prior COPD exacerbations. What are the signs or symptoms? Increased coughing. Increased mucus from your lungs. Increased wheezing. Increased shortness of breath. Fast breathing and finding it hard to breathe. Chest tightness. Less energy than usual. Sleep disruption from symptoms. Confusion. Increased sleepiness. Often, these symptoms happen or get worse even with the use of medicines. How is this treated? Treatment for this condition depends on how bad it is and the cause of the symptoms. You may need to stay in the hospital for treatment. Treatment may include: Taking medicines. Using oxygen. Being treated with different ways to clear your airway, such as using a mask to deliver oxygen. Follow these instructions at home: Medicines Take over-the-counter and prescription medicines only as told by your doctor. Use all inhaled medicines the correct way. If you were prescribed an antibiotic or steroid medicine, take it as told by your doctor. Do not stop taking it even if you start to feel better. Lifestyle Do not smoke or use any products that contain nicotine or tobacco. If you need help quitting, ask your doctor. Eat healthy foods. Exercise regularly. Get enough sleep. Most adults need 7 or more hours per night. Avoid  tobacco smoke and other things that can bother your lungs. Several times a day, wash your hands with soap and water for at least 20 seconds. If you cannot use soap and water, use hand sanitizer. This may help keep you from getting an infection. During flu season, avoid areas that are crowded with people. General instructions Drink enough fluid to keep your pee (urine) pale yellow. Do not do this if your doctor has told you not to. Use a cool mist machine (vaporizer). If you use oxygen or a machine that turns medicine into a mist (nebulizer), continue to use it as told. Keep all follow-up visits. How is this prevented? Keep up with shots (vaccinations) as told by your doctor. Be sure to get a yearly flu (influenza) shot. If you smoke, quit smoking. Smoking makes the problem worse. Follow all instructions for rehabilitation. These are steps you can take to make your body work better. Work with your doctor to develop and follow an action plan. This tells you what steps to take when you experience certain symptoms. Contact a doctor if: Your COPD symptoms get worse than normal. Get  help right away if: You are short of breath and it gets worse, even when you are resting. You have trouble talking. You have chest pain. You cough up blood. You have a fever. You keep vomiting. You feel weak or you pass out (faint). You feel confused. You are not able to sleep because of your symptoms. You have trouble doing daily activities. These symptoms may be an emergency. Get help right away. Call your local emergency services (911 in the [place] Do not wait to see if the symptoms will go away. Do not drive yourself to the hospital. Summary COPD exacerbations are times that breathing gets worse and you need more treatment than normal. COPD exacerbations can be very serious and may cause your lungs to become more damaged. Do not smoke. If you need help quitting, ask your doctor. Stay up to date on your  shots. Get a flu shot every year. This information is not intended to replace advice given to you by your health care provider. Make sure you discuss any questions you have with your health care provider. Document Revised: 02/10/2020 Document Reviewed: 01/26/2020 Elsevier Patient Education  Why.    If you have been instructed to have an in-person evaluation today at a local Urgent Care facility, please use the link below. It will take you to a list of all of our available [facility], including address, phone number and hours of operation. Please do not delay care.  [facility]  If you or a family member do not have a primary care provider, use the link below to schedule a visit and establish care. When you choose a [facility] primary care physician or advanced practice provider, you gain a long-term partner in health. Find a Primary Care Provider  Learn more about [facility]'s in-office and virtual care options: [facility]

## 2022-02-26 ENCOUNTER — Telehealth: Payer: Medicare PPO | Admitting: Nurse Practitioner

## 2022-02-26 DIAGNOSIS — J0140 Acute pansinusitis, unspecified: Secondary | ICD-10-CM | POA: Diagnosis not present

## 2022-02-26 MED ORDER — AMOXICILLIN 500 MG PO CAPS: 500.0000 mg | ORAL_CAPSULE | Freq: Three times a day (TID) | ORAL | 0 refills | Status: AC

## 2022-02-26 NOTE — Progress Notes (Signed)
Virtual Visit Consent   Shekita Boyden, you are scheduled for a virtual visit with a [facility] provider today. Just as with appointments in the office, your consent must be obtained to participate. Your consent will be active for this visit and any virtual visit you may have with one of our providers in the next 365 days. If you have a MyChart account, a copy of this consent can be sent to you electronically.  As this is a virtual visit, video technology does not allow for your provider to perform a traditional examination. This may limit your provider's ability to fully assess your condition. If your provider identifies any concerns that need to be evaluated in person or the need to arrange testing (such as labs, EKG, etc.), we will make arrangements to do so. Although advances in technology are sophisticated, we cannot ensure that it will always work on either your end or our end. If the connection with a video visit is poor, the visit may have to be switched to a telephone visit. With either a video or telephone visit, we are not always able to ensure that we have a secure connection.  By engaging in this virtual visit, you consent to the provision of healthcare and authorize for your insurance to be billed (if applicable) for the services provided during this visit. Depending on your insurance coverage, you may receive a charge related to this service.  I need to obtain your verbal consent now. Are you willing to proceed with your visit today? Elizabeth [name] has provided verbal consent on 02/26/2022 for a virtual visit (video or telephone). Apolonio Schneiders, FNP  Date: 02/26/2022 8:59 AM  Virtual Visit via Video Note   I, Apolonio Schneiders, connected with  Elizabeth [name]  ([id number], December 07, 1950) on 02/26/22 at  9:30 AM EST by a video-enabled telemedicine application and verified that I am speaking with the correct person using two identifiers.  Location: Patient: Virtual Visit Location Patient:  Home Provider: Virtual Visit Location Provider: Home Office   I discussed the limitations of evaluation and management by telemedicine and the availability of in person appointments. The patient expressed understanding and agreed to proceed.    History of Present Illness: Elizabeth [name] is a 71 y.o. who identifies as a female who was assigned female at birth, and is being seen today for ongoing cough and now with more sinus congestion. Previous VV was 11/22  She has been on doxycycline for the past 6 days without much improvement   She has a history of lung CA in remission and COPD   SpO2 95-96% She wears O2 at night  Uses nebulizer throughout the day   Today her worst symptoms are in her sinuses  Similar symptoms in August of this year resolved with Amox 500 three times daily. 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 03/11/2018   Senile nuclear sclerosis, left 03/11/2018   Substance abuse ([place])    Genetic testing    Pernicious anemia 11/27/2016   Normocytic anemia 10/16/2016   Iron deficiency anemia 10/16/2016  Centrilobular emphysema (HCC)    Major depressive disorder, recurrent, moderate ([place]) 09/24/2016   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   Bilateral carpal tunnel syndrome 05/27/2015   Bilateral hand numbness 05/17/2015   Fracture of humerus, proximal, closed 03/23/2013   Lipoma of skin and subcutaneous tissue 12/26/2011   B12 deficiency 02/15/2011   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   Dyspepsia and other specified disorders of function of stomach 10/11/2010    Benign essential hypertension 10/11/2010   Mixed hyperlipidemia 10/11/2010   Tobacco use disorder 10/11/2010   Anxiety state 08/25/2009   Shortness of breath 07/24/2006    Allergies:  Allergies  Allergen Reactions   Moxifloxacin Itching, Other (See Comments), Photosensitivity, Rash and Swelling    Causes swelling, redness, burning in eyes    Levofloxacin Other (See Comments)    Other reaction(s): Muscle Pain Other reaction(s): Muscle Pain   Amoxicillin-Pot Clavulanate Nausea Only and Other (See Comments)    Other Reaction: DIARRHEA. Other reaction(s): Other (See Comments) Other Reaction: DIARRHEA. Other Reaction: DIARRHEA.  Other reaction(s): Other (See Comments) Other Reaction: DIARRHEA.   Medications:  Current Outpatient Medications:    albuterol (PROVENTIL) (2.5 MG/3ML) 0.083% nebulizer solution, USE 1 VIAL IN NEBULIZER EVERY 6 HOURS AS NEEDED FOR WHEEZING AND FOR SHORTNESS OF BREATH, Disp: 225 mL, Rfl: 0   albuterol (VENTOLIN HFA) 108 (90 Base) MCG/ACT inhaler, Inhale 2 puffs into the lungs every 6 (six) hours as needed for wheezing or shortness of breath., Disp: 8 g, Rfl: 5   ARIPiprazole (ABILIFY) 5 MG tablet, Take 1 tablet (5 mg total) by mouth daily., Disp: 90 tablet, Rfl: 3   budesonide-formoterol (SYMBICORT) 160-4.5 MCG/ACT inhaler, Inhale 2 puffs into the lungs 2 (two) times daily., Disp: , Rfl:    cyanocobalamin (,VITAMIN B-12,) 1000 MCG/ML injection, Inject 1 mL (1,000 mcg total) into the muscle every 30 (thirty) days., Disp: 3 mL, Rfl: 3   doxycycline (VIBRAMYCIN) 100 MG capsule, Take 1 capsule (100 mg total) by mouth 2 (two) times daily., Disp: 20 capsule, Rfl: 0   fluticasone (FLONASE) 50 MCG/ACT nasal spray, Place 2 sprays into both nostrils daily., Disp: , Rfl: 1   furosemide (LASIX) 20 MG tablet, Take 20 mg by mouth daily., Disp: , Rfl: 0   losartan (COZAAR) 50 MG tablet, Take 50 mg by mouth daily., Disp: , Rfl:    magnesium oxide (MAG-OX) 400 MG tablet, Take by  mouth., Disp: , Rfl:    Multiple Vitamin (MULTI-VITAMINS) TABS, Take by mouth., Disp: , Rfl:    omeprazole (PRILOSEC) 20 MG capsule, Take 1 capsule (20 mg total) by mouth daily before breakfast., Disp: 90 capsule, Rfl: 3   polyethylene glycol (GAVILYTE-C) 240 g solution, At 5pm the evening before colonoscopy fill Gavilyte container with Clear liquid to the fill line.  Mix well. Drink 8 oz every 20 mins until half has been completed.  Continue with Clear liquid diet.  5 hours prior to colonoscopy resume drinking the remaining prep solution.  Drinking 8 oz every 20 mins until entire contents have been completed.  Do not eat or drink anything 2 hours prior to colonoscopy., Disp: 4000 mL, Rfl: 0   potassium chloride SA (KLOR-CON M) 20 MEQ tablet, Take 20 mEq by mouth daily., Disp: , Rfl:    simvastatin (ZOCOR) 80 MG tablet, Take 1 tablet (80 mg total) by mouth at bedtime., Disp: 90 tablet, Rfl: 3   tiotropium (SPIRIVA) 18 MCG inhalation capsule, Place 1 capsule (18 mcg total) into inhaler and inhale  daily., Disp: 90 capsule, Rfl: 3   venlafaxine XR (EFFEXOR XR) 75 MG 24 hr capsule, Take 3 capsules (225 mg total) by mouth daily with breakfast., Disp: 270 capsule, Rfl: 3  Observations/Objective: Patient is well-developed, well-nourished in no acute distress.  Resting comfortably  at home.  Head is normocephalic, atraumatic.  No labored breathing.  Speech is clear and coherent with logical content.  Patient is alert and oriented at baseline.    Assessment and Plan: 1. Acute non-recurrent pansinusitis Contact PCP for Chest Xray   - amoxicillin (AMOXIL) 500 MG capsule; Take 1 capsule (500 mg total) by mouth 3 (three) times daily for 10 days.  Dispense: 30 capsule; Refill: 0    Stop doxycycline  Continue Flonase  Continue nebulizer as directed   Follow Up Instructions: I discussed the assessment and treatment plan with the patient. The patient was provided an opportunity to ask questions and all  were answered. The patient agreed with the plan and demonstrated an understanding of the instructions.  A copy of instructions were sent to the patient via MyChart unless otherwise noted below.    The patient was advised to call back or seek an in-person evaluation if the symptoms worsen or if the condition fails to improve as anticipated.  Time:  I spent 15 minutes with the patient via telehealth technology discussing the above problems/concerns.    Apolonio Schneiders, FNP

## 2022-03-01 IMAGING — CT CT CHEST LCS NODULE FOLLOW-UP W/O CM
2 of 5 series · 15 of 40 positions shown, 18 images · non-contrast
Comparison: 12/10/2018 and 11/22/2017.

CLINICAL DATA: Current smoker, 53 pack-year history.

EXAM:
CT CHEST WITHOUT CONTRAST FOR LUNG CANCER SCREENING NODULE FOLLOW-UP
TECHNIQUE: Multidetector CT imaging of the chest was performed following the
standard protocol without IV contrast.

[Series 3: lung lcs f/u 1.00 · axial · 0.62mm/px · z∈[-1205,-924]mm · 12 of 311 slices shown, 15 images]
[im 15/311  mediastinal]
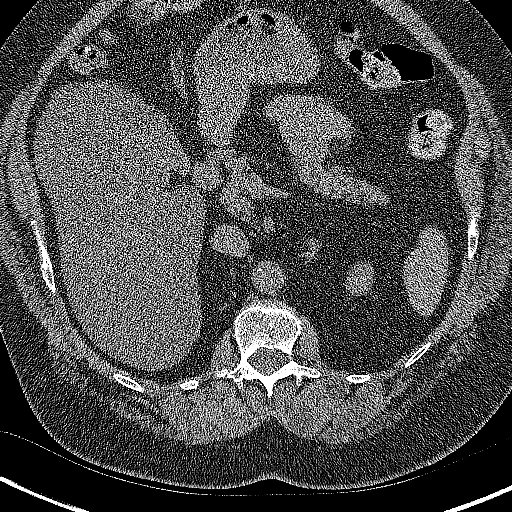
[im 15/311  lung]
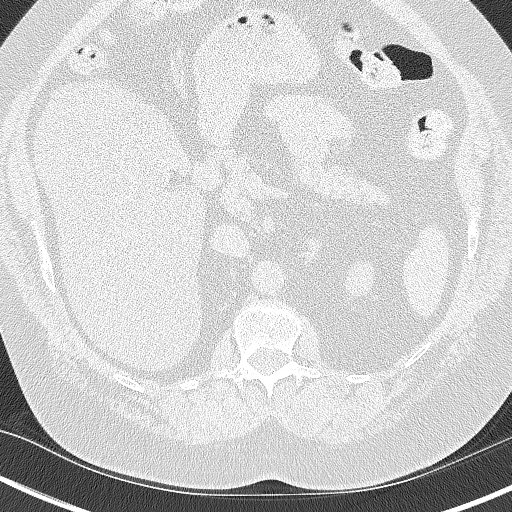
[im 43/311  lung]
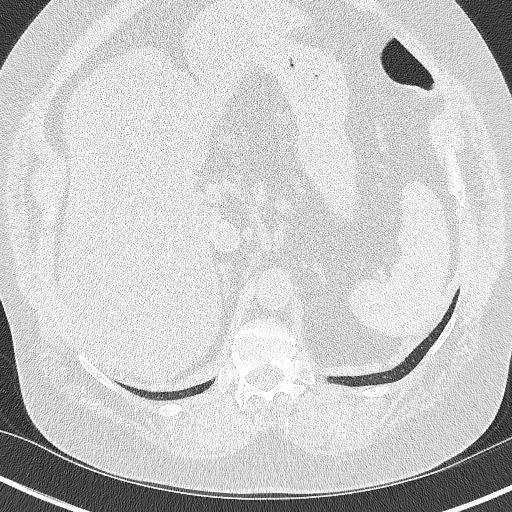
[im 71/311  lung]
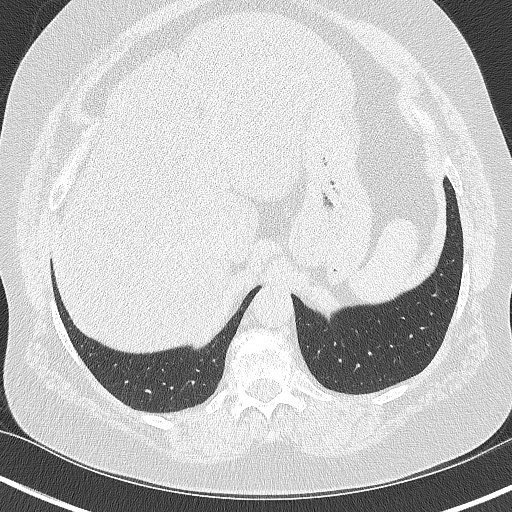
[im 99/311  lung]
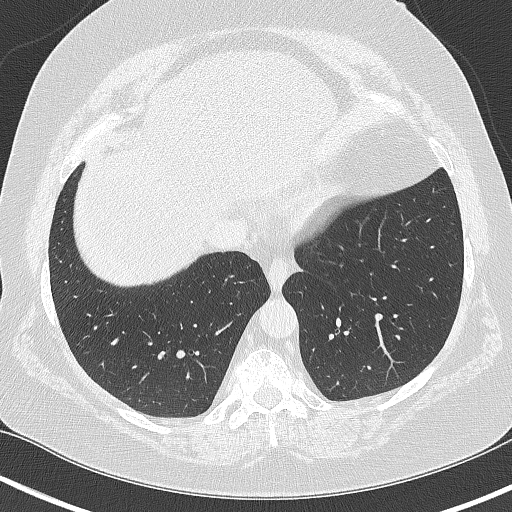
[im 113/311  mediastinal]
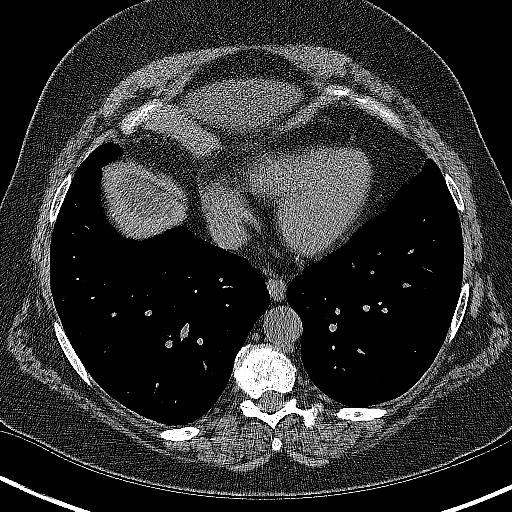
[im 113/311  lung]
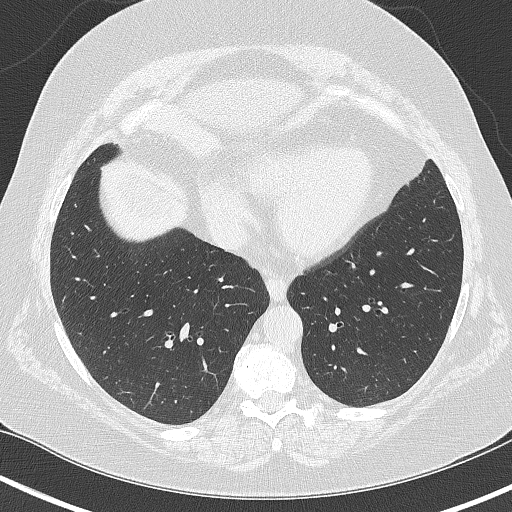
[im 141/311  lung]
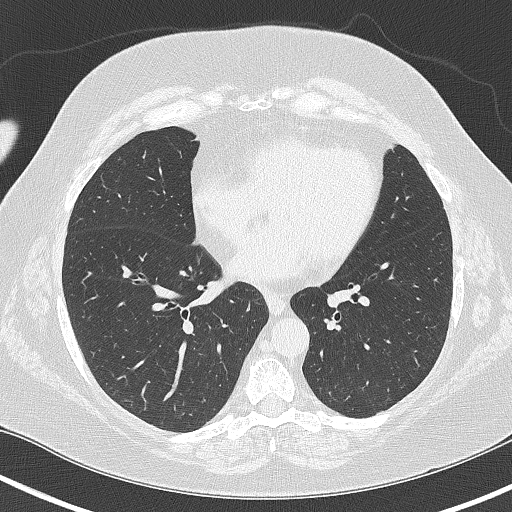
[im 170/311  lung]
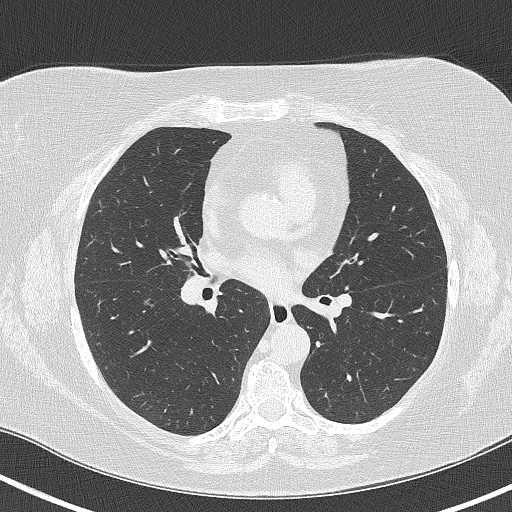
[im 198/311  lung]
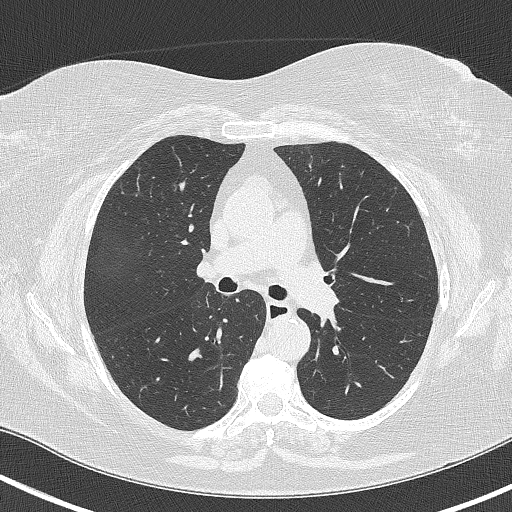
[im 212/311  mediastinal]
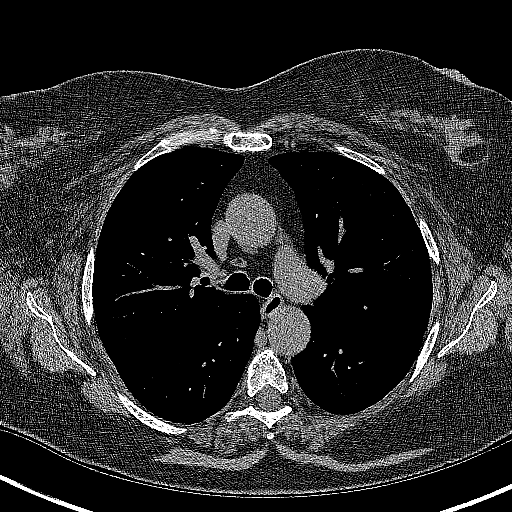
[im 212/311  lung]
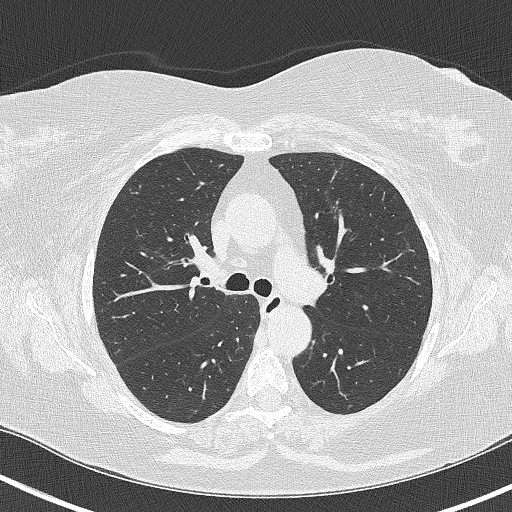
[im 240/311  lung]
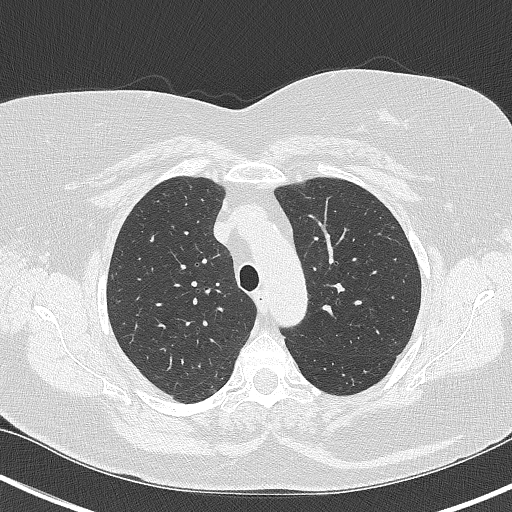
[im 268/311  lung]
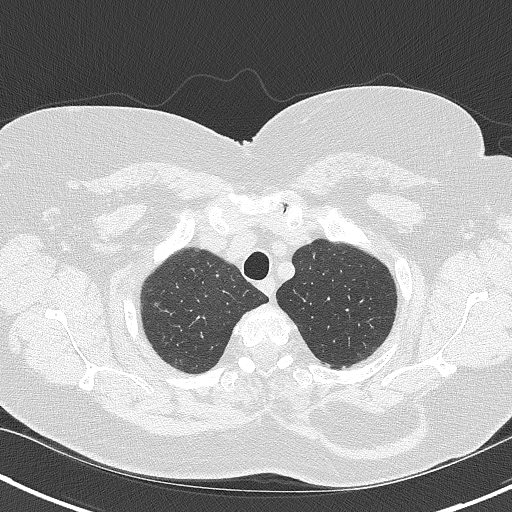
[im 296/311  lung]
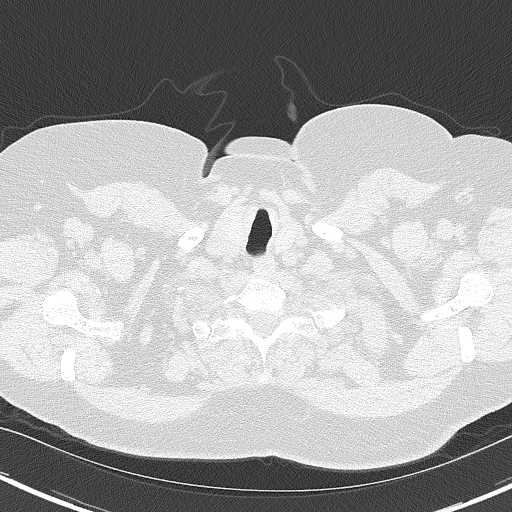

[Series 4: lcs f/u 1.00 cor · coronal · 0.61mm/px · 3 of 298 slices shown]
[im 60/298  lung]
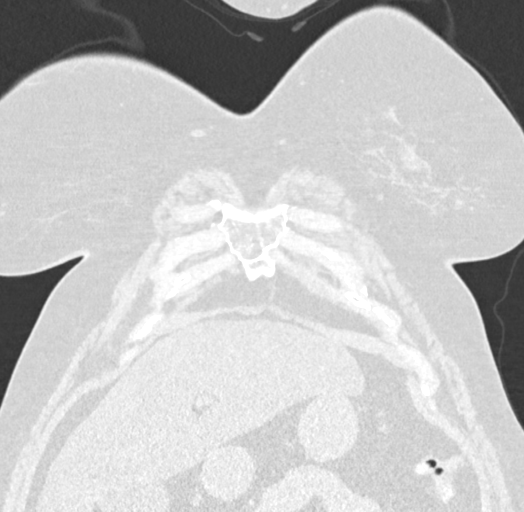
[im 119/298  lung]
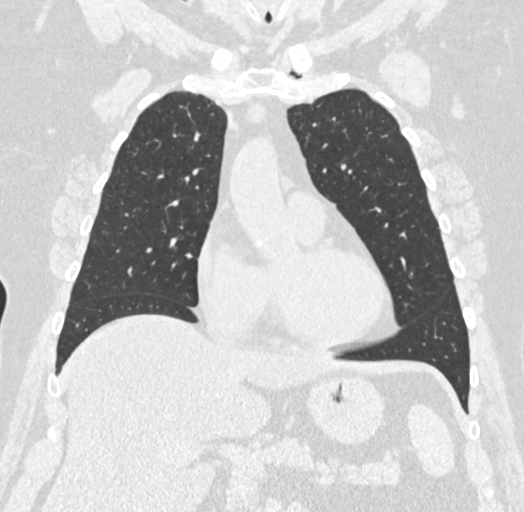
[im 179/298  lung]
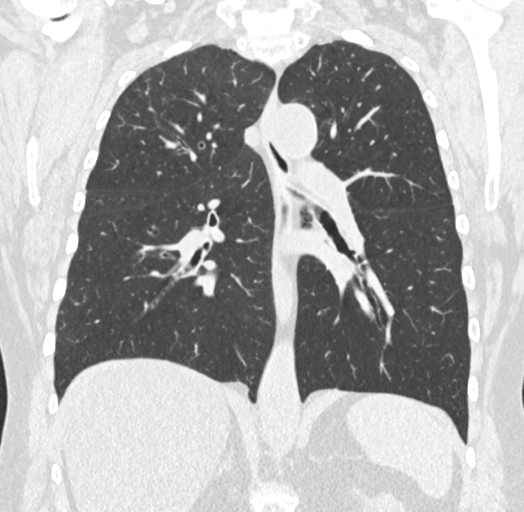

[15 of 40 positions shown; findings below may reference images not displayed]

FINDINGS: Cardiovascular: Atherosclerotic calcification of the aorta, aortic
valve and coronary arteries. Heart size normal. No pericardial
effusion.

Mediastinum/Nodes: No pathologically enlarged mediastinal or
axillary lymph nodes. Hilar regions are difficult to evaluate
without IV contrast but appear grossly unremarkable. Esophagus is
grossly unremarkable.

Lungs/Pleura: Smoking related respiratory bronchiolitis.
Centrilobular and paraseptal emphysema. An 8.7 mm nodule in the left
lower lobe (3/148) has increased in size from 5.8 mm on 12/10/2018
and is new from 11/22/2017. Additional pulmonary nodules are
unchanged. No pleural fluid. Airway is unremarkable.

Upper Abdomen: Liver is decreased in attenuation diffusely. Stones
are seen in the gallbladder. Right adrenal gland is unremarkable.
Nodular thickening of the left adrenal gland. Visualized portions of
the left kidney, spleen, pancreas, stomach and bowel are
unremarkable. No upper abdominal adenopathy.

Musculoskeletal: Degenerative changes in the spine. No worrisome
lytic or sclerotic lesions.
IMPRESSION: 1. Enlarging left lower lobe nodule, highly worrisome for primary
bronchogenic carcinoma. Lung-RADS 4B, suspicious. Additional imaging
evaluation or consultation with Pulmonology or Thoracic Surgery
recommended. These results will be called to the ordering clinician
or representative by the Radiologist Assistant, and communication
documented in the PACS or zVision Dashboard.
2. Hepatic steatosis.
3. Cholelithiasis.
4. Aortic atherosclerosis ([id number]-[id number].W). Coronary artery
calcification.
5.  Emphysema (952EH-QOP.2).

## 2022-04-03 ENCOUNTER — Other Ambulatory Visit: Payer: Self-pay

## 2022-04-03 DIAGNOSIS — D508 Other iron deficiency anemias: Secondary | ICD-10-CM

## 2022-04-04 ENCOUNTER — Other Ambulatory Visit: Payer: Medicare PPO

## 2022-04-04 DIAGNOSIS — D508 Other iron deficiency anemias: Secondary | ICD-10-CM | POA: Diagnosis not present

## 2022-04-05 ENCOUNTER — Telehealth: Payer: Self-pay | Admitting: *Deleted

## 2022-04-05 ENCOUNTER — Encounter: Payer: Self-pay | Admitting: Family Medicine

## 2022-04-05 ENCOUNTER — Other Ambulatory Visit: Payer: Self-pay | Admitting: *Deleted

## 2022-04-05 DIAGNOSIS — C3490 Malignant neoplasm of unspecified part of unspecified bronchus or lung: Secondary | ICD-10-CM

## 2022-04-05 LAB — IRON,TIBC AND FERRITIN PANEL
%SAT: 22 % (calc) (ref 16–45)
Ferritin: 39 ng/mL (ref 16–288)
Iron: 75 ug/dL (ref 45–160)
TIBC: 334 mcg/dL (calc) (ref 250–450)

## 2022-04-05 NOTE — Telephone Encounter (Signed)
Daughter called stating Dr Raliegh Ip said she should see Dr Grayland Ormond patient last seen 09/2020 as she transferred her care to [facility]. Please advise  Iron, TIBC and Ferritin Panel Order: [id number] Status: Final result     Visible to patient: Yes (seen)     Next appt: 04/11/2022 at 09:40 AM in Internal Medicine Nobie Putnam, DO)     Dx: Other iron deficiency anemia   1 Result Note     1 Patient Communication           Component Ref Range & Units 1 d ago (04/04/22) 3 mo ago (01/05/22) 6 mo ago (09/28/21) 1 yr ago (10/18/20) 1 yr ago (10/18/20) 1 yr ago (04/15/20) 1 yr ago (04/15/20)  Iron 45 - 160 mcg/dL 75 116 65  81 R  54 R  TIBC 250 - 450 mcg/dL (calc) 334 327 296  290 R  260 R  %SAT 16 - 45 % (calc) 22 35 22      Ferritin 16 - 288 ng/mL 39 58 55 127 R, CM  103 R, CM   Resulting Agency  [facility] [place] [facility]         Resulting Agency's Comment  Performing Organization Information:     Site IDGeannie Risen (CLIA: [id number])     Name: Mickie Kay     Address: [address] [place], [place] [address]     Director: Jackolyn Confer MD    Specimen Collected: 04/04/22 08:31 Last Resulted: 04/05/22 01:36      Lab Flowsheet      Order Details      View Encounter      Lab and Collection Details      Routing      Result History    View All Conversations on this Encounter      CM=Additional comments  R=Reference range differs from displayed range      Result Care Coordination   Result Notes   Olin Hauser, DO 04/05/2022  1:19 PM EST Back to Top    CC'd lab results to Dr Grayland Ormond for review and advice   Lab results released to MyChart with comments for patient.   Here are the results. Reduction in Saturation % from 35 to 22, iron from 116 to 75 and Ferritin reserves from 58 to 39. Yes it is reasonable to follow up with Hematology    Please let me know if you need assistance. I can route the results to them as well.   [facility] (Hematology/Oncology) [place], [place] [address] Phone: [phone]   [place] Grayland Ormond, [place], [facility] 04/05/2022, 1:19 PM

## 2022-04-11 ENCOUNTER — Ambulatory Visit: Payer: Medicare PPO | Admitting: Family Medicine

## 2022-04-19 ENCOUNTER — Ambulatory Visit
Admission: RE | Admit: 2022-04-19 | Discharge: 2022-04-19 | Disposition: A | Discharge status: Home / Self Care | Payer: Medicare PPO | Source: Ambulatory Visit | Attending: Oncology | Admitting: Oncology

## 2022-04-19 DIAGNOSIS — C3490 Malignant neoplasm of unspecified part of unspecified bronchus or lung: Secondary | ICD-10-CM | POA: Insufficient documentation

## 2022-04-19 LAB — POCT I-STAT CREATININE: Creatinine, Ser: 0.9 mg/dL (ref 0.44–1.00)

## 2022-04-19 MED ORDER — IOHEXOL 300 MG/ML  SOLN
75.0000 mL | Freq: Once | INTRAMUSCULAR | Status: AC | PRN
  Administered 2022-04-19: 75 mL via INTRAVENOUS

## 2022-04-23 ENCOUNTER — Inpatient Hospital Stay: Payer: Medicare PPO | Attending: Oncology | Admitting: Oncology

## 2022-04-23 ENCOUNTER — Encounter: Payer: Self-pay | Admitting: Oncology

## 2022-04-23 VITALS — BP 137/68 | HR 90 | Temp 99.0°F | Resp 20 | Ht 62.0 in | Wt 187.9 lb

## 2022-04-23 DIAGNOSIS — R531 Weakness: Secondary | ICD-10-CM | POA: Diagnosis not present

## 2022-04-23 DIAGNOSIS — Z85118 Personal history of other malignant neoplasm of bronchus and lung: Secondary | ICD-10-CM | POA: Insufficient documentation

## 2022-04-23 DIAGNOSIS — D509 Iron deficiency anemia, unspecified: Secondary | ICD-10-CM | POA: Diagnosis not present

## 2022-04-23 DIAGNOSIS — F17210 Nicotine dependence, cigarettes, uncomplicated: Secondary | ICD-10-CM | POA: Insufficient documentation

## 2022-04-23 DIAGNOSIS — C3490 Malignant neoplasm of unspecified part of unspecified bronchus or lung: Secondary | ICD-10-CM | POA: Diagnosis not present

## 2022-04-23 NOTE — Progress Notes (Signed)
[facility]  [phone] [phone]  ID: Aliegha Paullin OB: September 21, 1950  MR#: [id number]  BSA#:[id number]  Patient Care Team: Smitty Cords, DO as PCP - General (Family Medicine) Glory Buff, RN as Oncology Nurse Navigator Carmina Miller, MD as Radiation Oncologist (Radiation Oncology)  CHIEF COMPLAINT: Probable stage Ia left lower lobe lung cancer, history of iron deficiency anemia.  INTERVAL HISTORY: Patient returns to clinic today for further evaluation and discussion of her imaging results.  She complains of chronic weakness and fatigue, but otherwise feels well.  She has no neurologic complaints.  She denies any recent fevers or illnesses.  She has a good appetite and denies weight loss.  She has no chest pain, shortness of breath, cough, or hemoptysis.  She denies any nausea, vomiting, constipation, or diarrhea.  She has no urinary complaints.  Patient offers no further specific complaints today.  REVIEW OF SYSTEMS:   Review of Systems  Constitutional:  Positive for malaise/fatigue. Negative for fever and weight loss.  Respiratory: Negative.  Negative for cough, hemoptysis and shortness of breath.   Cardiovascular: Negative.  Negative for chest pain and leg swelling.  Gastrointestinal: Negative.  Negative for abdominal pain.  Genitourinary: Negative.  Negative for dysuria.  Musculoskeletal: Negative.  Negative for back pain.  Skin: Negative.  Negative for rash.  Neurological:  Positive for weakness. Negative for dizziness, focal weakness and headaches.  Psychiatric/Behavioral: Negative.  The patient is not nervous/anxious.     As per HPI. Otherwise, a complete review of systems is negative.  PAST MEDICAL HISTORY: Past Medical History:  Diagnosis Date   Allergy    Anxiety    B12 deficiency    Carpal tunnel syndrome    Centrilobular emphysema (HCC)    COPD (chronic obstructive pulmonary disease) (HCC)    Depression     Genetic testing    Common Cancers panel (47 genes) @ [facility] - No pathogenic mutations detected   HLD (hyperlipidemia)    Hyperlipidemia    Hypertension    Iron deficiency anemia 10/16/2016   Lipoma of skin    Lung nodule    Normocytic anemia 10/16/2016   QT prolongation    Substance abuse (HCC)     PAST SURGICAL HISTORY: Past Surgical History:  Procedure Laterality Date   ABDOMINAL HYSTERECTOMY     BREAST EXCISIONAL BIOPSY Left    1980's neg   CESAREAN SECTION     TUBAL LIGATION      FAMILY HISTORY: Family History  Problem Relation Age of Onset   Ovarian cancer Mother 30       deceased 63   Breast cancer Mother 39   Stroke Father    Lung cancer Paternal Aunt    Lung cancer Cousin     ADVANCED DIRECTIVES (Y/N):  N  HEALTH MAINTENANCE: Social History   Tobacco Use   Smoking status: Every Day    Packs/day: 0.50    Years: 52.00    Total pack years: 26.00    Types: Cigarettes   Smokeless tobacco: Never   Tobacco comments:    5 ciggarette a day--03/02/2020  Vaping Use   Vaping Use: Former  Substance Use Topics   Alcohol use: No   Drug use: No     Colonoscopy:  PAP:  Bone density:  Lipid panel:  Allergies  Allergen Reactions   Moxifloxacin Itching, Other (See Comments), Photosensitivity, Rash and Swelling    Causes swelling, redness, burning in eyes    Levofloxacin Other (See Comments)  Other reaction(s): Muscle Pain Other reaction(s): Muscle Pain   Amoxicillin-Pot Clavulanate Nausea Only and Other (See Comments)    Other Reaction: DIARRHEA. Other reaction(s): Other (See Comments) Other Reaction: DIARRHEA. Other Reaction: DIARRHEA.  Other reaction(s): Other (See Comments) Other Reaction: DIARRHEA.    Current Outpatient Medications  Medication Sig Dispense Refill   albuterol (PROVENTIL) (2.5 MG/3ML) 0.083% nebulizer solution USE 1 VIAL IN NEBULIZER EVERY 6 HOURS AS NEEDED FOR WHEEZING AND FOR SHORTNESS OF BREATH 225 mL 0   albuterol (VENTOLIN  HFA) 108 (90 Base) MCG/ACT inhaler Inhale 2 puffs into the lungs every 6 (six) hours as needed for wheezing or shortness of breath. 8 g 5   ARIPiprazole (ABILIFY) 5 MG tablet Take 1 tablet (5 mg total) by mouth daily. 90 tablet 3   budesonide-formoterol (SYMBICORT) 160-4.5 MCG/ACT inhaler Inhale 2 puffs into the lungs 2 (two) times daily.     cyanocobalamin (,VITAMIN B-12,) 1000 MCG/ML injection Inject 1 mL (1,000 mcg total) into the muscle every 30 (thirty) days. 3 mL 3   fluticasone (FLONASE) 50 MCG/ACT nasal spray Place 2 sprays into both nostrils daily.  1   furosemide (LASIX) 20 MG tablet Take 20 mg by mouth daily.  0   losartan (COZAAR) 50 MG tablet Take 50 mg by mouth daily.     Multiple Vitamin (MULTI-VITAMINS) TABS Take by mouth.     omeprazole (PRILOSEC) 20 MG capsule Take 1 capsule (20 mg total) by mouth daily before breakfast. 90 capsule 3   potassium chloride SA (KLOR-CON M) 20 MEQ tablet Take 20 mEq by mouth daily.     simvastatin (ZOCOR) 80 MG tablet Take 1 tablet (80 mg total) by mouth at bedtime. 90 tablet 3   tiotropium (SPIRIVA) 18 MCG inhalation capsule Place 1 capsule (18 mcg total) into inhaler and inhale daily. 90 capsule 3   venlafaxine XR (EFFEXOR XR) 75 MG 24 hr capsule Take 3 capsules (225 mg total) by mouth daily with breakfast. 270 capsule 3   No current facility-administered medications for this visit.    OBJECTIVE: Vitals:   04/23/22 1050  BP: 137/68  Pulse: 90  Resp: 20  Temp: 99 F (37.2 C)  SpO2: 97%      Body mass index is 34.37 kg/m.    ECOG FS:0 - Asymptomatic  General: Well-developed, well-nourished, no acute distress. Eyes: Pink conjunctiva, anicteric sclera. HEENT: Normocephalic, moist mucous membranes. Lungs: No audible wheezing or coughing. Heart: Regular rate and rhythm. Abdomen: Soft, nontender, no obvious distention. Musculoskeletal: No edema, cyanosis, or clubbing. Neuro: Alert, answering all questions appropriately. Cranial nerves  grossly intact. Skin: No rashes or petechiae noted. Psych: Normal affect.   LAB RESULTS:  Lab Results  Component Value Date   NA 140 01/05/2022   K 4.3 01/05/2022   CL 101 01/05/2022   CO2 28 01/05/2022   GLUCOSE 96 01/05/2022   BUN 10 01/05/2022   CREATININE 0.90 04/19/2022   CALCIUM 9.5 01/05/2022   PROT 7.0 01/05/2022   ALBUMIN 3.8 10/18/2020   AST 37 (H) 01/05/2022   ALT 25 01/05/2022   ALKPHOS 84 10/18/2020   BILITOT 0.6 01/05/2022   GFRNONAA >60 10/18/2020   GFRAA >60 10/20/2019    Lab Results  Component Value Date   WBC 7.2 01/05/2022   NEUTROABS 4,522 01/05/2022   HGB 13.2 01/05/2022   HCT 40.7 01/05/2022   MCV 88.1 01/05/2022   PLT 313 01/05/2022   Lab Results  Component Value Date   IRON 75 04/04/2022  TIBC 334 04/04/2022   IRONPCTSAT 22 04/04/2022   Lab Results  Component Value Date   FERRITIN 39 04/04/2022     STUDIES: CT Chest W Contrast  Result Date: 04/19/2022 CLINICAL DATA:  Non-small cell lung cancer restaging. Radiation therapy completed in April 2021. * Tracking Code: BO * EXAM: CT CHEST WITH CONTRAST TECHNIQUE: Multidetector CT imaging of the chest was performed during intravenous contrast administration. RADIATION DOSE REDUCTION: This exam was performed according to the departmental dose-optimization program which includes automated exposure control, adjustment of the mA and/or kV according to patient size and/or use of iterative reconstruction technique. CONTRAST:  43mL OMNIPAQUE IOHEXOL 300 MG/ML  SOLN COMPARISON:  10/18/2020 FINDINGS: Cardiovascular: Coronary, aortic arch, and branch vessel atherosclerotic vascular disease. Fatty prominence of the interatrial septum with prominent epicardial adipose tissue causing some potential mild narrowing of the lower SVC for example on image 73 series 2. Mediastinum/Nodes: Unremarkable Lungs/Pleura: Mild biapical pleuroparenchymal scarring. Scattered stable minimal subpleural nodularity in the lungs,  considered benign and not specifically requiring follow up imaging. There is some airway plugging in the right lower lobe for example on image 100 series 3. Airway thickening is present, suggesting bronchitis or reactive airways disease. Mild accentuated findings in the radiation fibrosis in the left lower lobe. The area of the original pulmonary nodule measures 4 by 2 mm on images 74-75 of series 3, no growth compared to the prior exam. No new worrisome lesion identified. Upper Abdomen: Cholelithiasis. 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 Mild abdominal aortic atherosclerosis. Musculoskeletal: Old healed left posterolateral rib fractures. Thoracic spondylosis. IMPRESSION: 1. Stable appearance of radiation fibrosis in the left lower lobe. No findings of recurrent malignancy. 2. Fatty prominence of the interatrial septum with prominent epicardial adipose tissue causing some potential mild narrowing of the lower SVC. 3. Airway thickening is present, suggesting bronchitis or reactive airways disease. There is some airway plugging in the right lower lobe. 4. Cholelithiasis. 5. 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 6. Aortic atherosclerosis. Aortic Atherosclerosis (ICD10-I70.0). Electronically Signed   By: Gaylyn Rong M.D.   On: 04/19/2022 16:11    ASSESSMENT: Probable stage Ia left lower lobe lung cancer, history of iron deficiency anemia.  PLAN:    1.  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  She completed treatment in approximately May 2021.  Her most recent CT scan on April 19, 2022 reviewed independently and report as above with no obvious evidence of recurrent or progressive disease.  Patient is now nearly 3 years removed completing her treatment and can be switched to yearly visits.  Return to clinic in 1 year with repeat CT scan and  further evaluation.   2.  History of iron deficiency anemia: Resolved.  Patient's CBC, iron panel, B12 levels continue to be within normal limits.  She has been instructed to continue her oral iron supplementation.  Patient last received IV Feraheme on October 26, 2016. 3.  Lipoma: Benign.  No intervention is needed.  Patient has been instructed to follow-up with primary care if she has any further questions or concerns. 4.  Weakness and fatigue: Unclear etiology.  Appears unrelated to underlying history of malignancy or iron deficiency.   Jeralyn Ruths, MD   04/23/2022 1:28 PM

## 2022-04-23 NOTE — Progress Notes (Signed)
States she has been feeling very tired and weak.

## 2022-05-08 ENCOUNTER — Telehealth: Payer: Medicare PPO | Admitting: Physician Assistant

## 2022-05-08 DIAGNOSIS — B9689 Other specified bacterial agents as the cause of diseases classified elsewhere: Secondary | ICD-10-CM

## 2022-05-08 DIAGNOSIS — J208 Acute bronchitis due to other specified organisms: Secondary | ICD-10-CM | POA: Diagnosis not present

## 2022-05-08 MED ORDER — AMOXICILLIN 500 MG PO CAPS: 500.0000 mg | ORAL_CAPSULE | Freq: Three times a day (TID) | ORAL | 0 refills | Status: AC

## 2022-05-08 NOTE — Progress Notes (Signed)
Virtual Visit Consent   [name], you are scheduled for a virtual visit with a [facility] provider today. Just as with appointments in the office, your consent must be obtained to participate. Your consent will be active for this visit and any virtual visit you may have with one of our providers in the next 365 days. If you have a MyChart account, a copy of this consent can be sent to you electronically.  As this is a virtual visit, video technology does not allow for your provider to perform a traditional examination. This may limit your provider's ability to fully assess your condition. If your provider identifies any concerns that need to be evaluated in person or the need to arrange testing (such as labs, EKG, etc.), we will make arrangements to do so. Although advances in technology are sophisticated, we cannot ensure that it will always work on either your end or our end. If the connection with a video visit is poor, the visit may have to be switched to a telephone visit. With either a video or telephone visit, we are not always able to ensure that we have a secure connection.  By engaging in this virtual visit, you consent to the provision of healthcare and authorize for your insurance to be billed (if applicable) for the services provided during this visit. Depending on your insurance coverage, you may receive a charge related to this service.  I need to obtain your verbal consent now. Are you willing to proceed with your visit today? Elizabeth [name] has provided verbal consent on 05/08/2022 for a virtual visit (video or telephone). Mar Daring, PA-C  Date: 05/08/2022 5:33 PM  Virtual Visit via Video Note   I, Mar Daring, connected with  Elizabeth [name]  ([id number], 73/16/1952) on 05/08/22 at  5:30 PM EST by a video-enabled telemedicine application and verified that I am speaking with the correct person using two identifiers.  Location: Patient: Virtual Visit Location  Patient: Home Provider: Virtual Visit Location Provider: Home Office   I discussed the limitations of evaluation and management by telemedicine and the availability of in person appointments. The patient expressed understanding and agreed to proceed.    History of Present Illness: Elizabeth [name] is a 72 y.o. who identifies as a female who was assigned female at birth, and is being seen today for cough and congestion.  HPI: Cough This is a new problem. The current episode started 1 to 4 weeks ago (3 weeks ago). The problem has been gradually worsening. The cough is Productive of sputum and productive of purulent sputum. Associated symptoms include ear congestion, myalgias, nasal congestion, postnasal drip, rhinorrhea and shortness of breath. Pertinent negatives include no chills, ear pain, fever or sore throat. Associated symptoms comments: Lungs feel heavy, fatigue, DOE. The symptoms are aggravated by lying down. She has tried nothing for the symptoms. The treatment provided no relief. Her past medical history is significant for bronchitis and COPD. There is no history of bronchiectasis or environmental allergies.    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 03/11/2018   Senile nuclear sclerosis, left 03/11/2018   Substance abuse ([place])    Genetic testing  Pernicious anemia 11/27/2016   Normocytic anemia 10/16/2016   Iron deficiency anemia 10/16/2016   Centrilobular emphysema ([place])    Major depressive disorder, recurrent, moderate ([place]) 09/24/2016   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   Bilateral carpal tunnel syndrome 05/27/2015   Bilateral hand numbness 05/17/2015   Fracture of humerus,  proximal, closed 03/23/2013   Lipoma of skin and subcutaneous tissue 12/26/2011   B12 deficiency 02/15/2011   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   Dyspepsia and other specified disorders of function of stomach 10/11/2010   Benign essential hypertension 10/11/2010   Mixed hyperlipidemia 10/11/2010   Tobacco use disorder 10/11/2010   Anxiety state 08/25/2009   Shortness of breath 07/24/2006    Allergies:  Allergies  Allergen Reactions   Moxifloxacin Itching, Other (See Comments), Photosensitivity, Rash and Swelling    Causes swelling, redness, burning in eyes    Levofloxacin Other (See Comments)    Other reaction(s): Muscle Pain Other reaction(s): Muscle Pain   Amoxicillin-Pot Clavulanate Nausea Only and Other (See Comments)    Other Reaction: DIARRHEA. Other reaction(s): Other (See Comments) Other Reaction: DIARRHEA. Other Reaction: DIARRHEA.  Other reaction(s): Other (See Comments) Other Reaction: DIARRHEA.   Medications:  Current Outpatient Medications:    amoxicillin (AMOXIL) 500 MG capsule, Take 1 capsule (500 mg total) by mouth 3 (three) times daily for 10 days., Disp: 30 capsule, Rfl: 0   albuterol (PROVENTIL) (2.5 MG/3ML) 0.083% nebulizer solution, USE 1 VIAL IN NEBULIZER EVERY 6 HOURS AS NEEDED FOR WHEEZING AND FOR SHORTNESS OF BREATH, Disp: 225 mL, Rfl: 0   albuterol (VENTOLIN HFA) 108 (90 Base) MCG/ACT inhaler, Inhale 2 puffs into the lungs every 6 (six) hours as needed for wheezing or shortness of breath., Disp: 8 g, Rfl: 5   ARIPiprazole (ABILIFY) 5 MG tablet, Take 1 tablet (5 mg total) by mouth daily., Disp: 90 tablet, Rfl: 3   budesonide-formoterol (SYMBICORT) 160-4.5 MCG/ACT inhaler, Inhale 2 puffs into the lungs 2 (two) times daily., Disp: , Rfl:    cyanocobalamin (,VITAMIN B-12,) 1000 MCG/ML injection, Inject 1 mL (1,000 mcg total) into the muscle every 30 (thirty) days., Disp: 3 mL, Rfl: 3   fluticasone (FLONASE) 50 MCG/ACT nasal spray, Place 2  sprays into both nostrils daily., Disp: , Rfl: 1   furosemide (LASIX) 20 MG tablet, Take 20 mg by mouth daily., Disp: , Rfl: 0   losartan (COZAAR) 50 MG tablet, Take 50 mg by mouth daily., Disp: , Rfl:    Multiple Vitamin (MULTI-VITAMINS) TABS, Take by mouth., Disp: , Rfl:    omeprazole (PRILOSEC) 20 MG capsule, Take 1 capsule (20 mg total) by mouth daily before breakfast., Disp: 90 capsule, Rfl: 3   potassium chloride SA (KLOR-CON M) 20 MEQ tablet, Take 20 mEq by mouth daily., Disp: , Rfl:    simvastatin (ZOCOR) 80 MG tablet, Take 1 tablet (80 mg total) by mouth at bedtime., Disp: 90 tablet, Rfl: 3   tiotropium (SPIRIVA) 18 MCG inhalation capsule, Place 1 capsule (18 mcg total) into inhaler and inhale daily., Disp: 90 capsule, Rfl: 3   venlafaxine XR (EFFEXOR XR) 75 MG 24 hr capsule, Take 3 capsules (225 mg total) by mouth daily with breakfast., Disp: 270 capsule, Rfl: 3  Observations/Objective: Patient is well-developed, well-nourished in no acute distress.  Resting comfortably at home.  Head is normocephalic, atraumatic.  No labored breathing.  Speech is clear and coherent with logical content.  Patient is alert and oriented at baseline.    Assessment and Plan: 1. Acute  bacterial bronchitis - amoxicillin (AMOXIL) 500 MG capsule; Take 1 capsule (500 mg total) by mouth 3 (three) times daily for 10 days.  Dispense: 30 capsule; Refill: 0  - Worsening over a week despite OTC medications - Will treat with Amoxil - Can continue Mucinex  - Continue inhalers and nebulizer meds as needed and as prescribed - Push fluids.  - Rest.  - Steam and humidifier can help - Seek in person evaluation if worsening or symptoms fail to improve    Follow Up Instructions: I discussed the assessment and treatment plan with the patient. The patient was provided an opportunity to ask questions and all were answered. The patient agreed with the plan and demonstrated an understanding of the instructions.  A  copy of instructions were sent to the patient via MyChart unless otherwise noted below.    The patient was advised to call back or seek an in-person evaluation if the symptoms worsen or if the condition fails to improve as anticipated.  Time:  I spent 10 minutes with the patient via telehealth technology discussing the above problems/concerns.    Mar Daring, PA-C

## 2022-05-08 NOTE — Patient Instructions (Signed)
Elizabeth [name], thank you for joining Mar Daring, PA-C for today's virtual visit.  While this provider is not your primary care provider (PCP), if your PCP is located in our provider database this encounter information will be shared with them immediately following your visit.   [facility] account gives you access to today's visit and all your visits, tests, and labs performed at [facility] " click here if you don't have a [facility] [place] account or go to [email or web address]  Consent: (Patient) Elizabeth [name] provided verbal consent for this virtual visit at the beginning of the encounter.  Current Medications:  Current Outpatient Medications:    amoxicillin (AMOXIL) 500 MG capsule, Take 1 capsule (500 mg total) by mouth 3 (three) times daily for 10 days., Disp: 30 capsule, Rfl: 0   albuterol (PROVENTIL) (2.5 MG/3ML) 0.083% nebulizer solution, USE 1 VIAL IN NEBULIZER EVERY 6 HOURS AS NEEDED FOR WHEEZING AND FOR SHORTNESS OF BREATH, Disp: 225 mL, Rfl: 0   albuterol (VENTOLIN HFA) 108 (90 Base) MCG/ACT inhaler, Inhale 2 puffs into the lungs every 6 (six) hours as needed for wheezing or shortness of breath., Disp: 8 g, Rfl: 5   ARIPiprazole (ABILIFY) 5 MG tablet, Take 1 tablet (5 mg total) by mouth daily., Disp: 90 tablet, Rfl: 3   budesonide-formoterol (SYMBICORT) 160-4.5 MCG/ACT inhaler, Inhale 2 puffs into the lungs 2 (two) times daily., Disp: , Rfl:    cyanocobalamin (,VITAMIN B-12,) 1000 MCG/ML injection, Inject 1 mL (1,000 mcg total) into the muscle every 30 (thirty) days., Disp: 3 mL, Rfl: 3   fluticasone (FLONASE) 50 MCG/ACT nasal spray, Place 2 sprays into both nostrils daily., Disp: , Rfl: 1   furosemide (LASIX) 20 MG tablet, Take 20 mg by mouth daily., Disp: , Rfl: 0   losartan (COZAAR) 50 MG tablet, Take 50 mg by mouth daily., Disp: , Rfl:    Multiple Vitamin (MULTI-VITAMINS) TABS, Take by mouth., Disp: , Rfl:    omeprazole (PRILOSEC) 20 MG  capsule, Take 1 capsule (20 mg total) by mouth daily before breakfast., Disp: 90 capsule, Rfl: 3   potassium chloride SA (KLOR-CON M) 20 MEQ tablet, Take 20 mEq by mouth daily., Disp: , Rfl:    simvastatin (ZOCOR) 80 MG tablet, Take 1 tablet (80 mg total) by mouth at bedtime., Disp: 90 tablet, Rfl: 3   tiotropium (SPIRIVA) 18 MCG inhalation capsule, Place 1 capsule (18 mcg total) into inhaler and inhale daily., Disp: 90 capsule, Rfl: 3   venlafaxine XR (EFFEXOR XR) 75 MG 24 hr capsule, Take 3 capsules (225 mg total) by mouth daily with breakfast., Disp: 270 capsule, Rfl: 3   Medications ordered in this encounter:  Meds ordered this encounter  Medications   amoxicillin (AMOXIL) 500 MG capsule    Sig: Take 1 capsule (500 mg total) by mouth 3 (three) times daily for 10 days.    Dispense:  30 capsule    Refill:  0    Order Specific Question:   Supervising Provider    Answer:   Chase Picket [id number]     *If you need refills on other medications prior to your next appointment, please contact your pharmacy*  Follow-Up: Call back or seek an in-person evaluation if the symptoms worsen or if the condition fails to improve as anticipated.  [place] [phone]  Other Instructions Acute Bronchitis, Adult  Acute bronchitis is sudden inflammation of the main airways (bronchi) that come off the windpipe (trachea) in  the lungs. The swelling causes the airways to get smaller and make more mucus than normal. This can make it hard to breathe and can cause coughing or noisy breathing (wheezing). Acute bronchitis may last several weeks. The cough may last longer. Allergies, asthma, and exposure to smoke may make the condition worse. What are the causes? This condition can be caused by germs and by substances that irritate the lungs, including: Cold and flu viruses. The most common cause of this condition is the virus that causes the common cold. Bacteria. This is less  common. Breathing in substances that irritate the lungs, including: Smoke from cigarettes and other forms of tobacco. Dust and pollen. Fumes from household cleaning products, gases, or burned fuel. Indoor or outdoor air pollution. What increases the risk? The following factors may make you more likely to develop this condition: A weak body's defense system, also called the immune system. A condition that affects your lungs and breathing, such as asthma. What are the signs or symptoms? Common symptoms of this condition include: Coughing. This may bring up clear, yellow, or green mucus from your lungs (sputum). Wheezing. Runny or stuffy nose. Having too much mucus in your lungs (chest congestion). Shortness of breath. Aches and pains, including sore throat or chest. How is this diagnosed? This condition is usually diagnosed based on: Your symptoms and medical history. A physical exam. You may also have other tests, including tests to rule out other conditions, such as pneumonia. These tests include: A test of lung function. Test of a mucus sample to look for the presence of bacteria. Tests to check the oxygen level in your blood. Blood tests. Chest X-ray. How is this treated? Most cases of acute bronchitis clear up over time without treatment. Your health care provider may recommend: Drinking more fluids to help thin your mucus so it is easier to cough up. Taking inhaled medicine (inhaler) to improve air flow in and out of your lungs. Using a vaporizer or a humidifier. These are machines that add water to the air to help you breathe better. Taking a medicine that thins mucus and clears congestion (expectorant). Taking a medicine that prevents or stops coughing (cough suppressant). It is not common to take an antibiotic medicine for this condition. Follow these instructions at home:  Take over-the-counter and prescription medicines only as told by your health care provider. Use an  inhaler, vaporizer, or humidifier as told by your health care provider. Take two teaspoons (10 mL) of honey at bedtime to lessen coughing at night. Drink enough fluid to keep your urine pale yellow. Do not use any products that contain nicotine or tobacco. These products include cigarettes, chewing tobacco, and vaping devices, such as e-cigarettes. If you need help quitting, ask your health care provider. Get plenty of rest. Return to your normal activities as told by your health care provider. Ask your health care provider what activities are safe for you. Keep all follow-up visits. This is important. How is this prevented? To lower your risk of getting this condition again: Wash your hands often with soap and water for at least 20 seconds. If soap and water are not available, use hand sanitizer. Avoid contact with people who have cold symptoms. Try not to touch your mouth, nose, or eyes with your hands. Avoid breathing in smoke or chemical fumes. Breathing smoke or chemical fumes will make your condition worse. Get the flu shot every year. Contact a health care provider if: Your symptoms do not improve  after 2 weeks. You have trouble coughing up the mucus. Your cough keeps you awake at night. You have a fever. Get help right away if you: Cough up blood. Feel pain in your chest. Have severe shortness of breath. Faint or keep feeling like you are going to faint. Have a severe headache. Have a fever or chills that get worse. These symptoms may represent a serious problem that is an emergency. Do not wait to see if the symptoms will go away. Get medical help right away. Call your local emergency services (911 in the [place] Do not drive yourself to the hospital. Summary Acute bronchitis is inflammation of the main airways (bronchi) that come off the windpipe (trachea) in the lungs. The swelling causes the airways to get smaller and make more mucus than normal. Drinking more fluids can help  thin your mucus so it is easier to cough up. Take over-the-counter and prescription medicines only as told by your health care provider. Do not use any products that contain nicotine or tobacco. These products include cigarettes, chewing tobacco, and vaping devices, such as e-cigarettes. If you need help quitting, ask your health care provider. Contact a health care provider if your symptoms do not improve after 2 weeks. This information is not intended to replace advice given to you by your health care provider. Make sure you discuss any questions you have with your health care provider. Document Revised: 06/29/2021 Document Reviewed: 07/20/2020 Elsevier Patient Education  [place].    If you have been instructed to have an in-person evaluation today at a local Urgent Care facility, please use the link below. It will take you to a list of all of our available [facility], including address, phone number and hours of operation. Please do not delay care.  [facility]  If you or a family member do not have a primary care provider, use the link below to schedule a visit and establish care. When you choose a [facility] primary care physician or advanced practice provider, you gain a long-term partner in health. Find a Primary Care Provider  Learn more about 's in-office and virtual care options: [facility]

## 2022-05-15 DIAGNOSIS — H47011 Ischemic optic neuropathy, right eye: Secondary | ICD-10-CM | POA: Diagnosis not present

## 2022-05-22 DIAGNOSIS — J431 Panlobular emphysema: Secondary | ICD-10-CM | POA: Diagnosis not present

## 2022-05-22 DIAGNOSIS — Z7951 Long term (current) use of inhaled steroids: Secondary | ICD-10-CM | POA: Diagnosis not present

## 2022-05-22 DIAGNOSIS — E042 Nontoxic multinodular goiter: Secondary | ICD-10-CM | POA: Diagnosis not present

## 2022-05-22 DIAGNOSIS — R0683 Snoring: Secondary | ICD-10-CM | POA: Diagnosis not present

## 2022-05-22 DIAGNOSIS — F17210 Nicotine dependence, cigarettes, uncomplicated: Secondary | ICD-10-CM | POA: Diagnosis not present

## 2022-05-22 DIAGNOSIS — Z79899 Other long term (current) drug therapy: Secondary | ICD-10-CM | POA: Diagnosis not present

## 2022-05-22 DIAGNOSIS — D510 Vitamin B12 deficiency anemia due to intrinsic factor deficiency: Secondary | ICD-10-CM | POA: Diagnosis not present

## 2022-05-22 DIAGNOSIS — J449 Chronic obstructive pulmonary disease, unspecified: Secondary | ICD-10-CM | POA: Diagnosis not present

## 2022-05-22 DIAGNOSIS — I2510 Atherosclerotic heart disease of native coronary artery without angina pectoris: Secondary | ICD-10-CM | POA: Diagnosis not present

## 2022-05-30 DIAGNOSIS — G4733 Obstructive sleep apnea (adult) (pediatric): Secondary | ICD-10-CM | POA: Diagnosis not present

## 2022-07-02 ENCOUNTER — Encounter: Payer: Self-pay | Admitting: Family Medicine

## 2022-07-02 DIAGNOSIS — I10 Essential (primary) hypertension: Secondary | ICD-10-CM

## 2022-07-02 MED ORDER — POTASSIUM CHLORIDE CRYS ER 20 MEQ PO TBCR: 20.0000 meq | EXTENDED_RELEASE_TABLET | Freq: Every day | ORAL | 1 refills | Status: DC

## 2022-07-02 MED ORDER — LOSARTAN POTASSIUM 50 MG PO TABS: 50.0000 mg | ORAL_TABLET | Freq: Every day | ORAL | 1 refills | Status: DC

## 2022-07-09 ENCOUNTER — Other Ambulatory Visit: Payer: Self-pay | Admitting: Family Medicine

## 2022-07-09 DIAGNOSIS — K219 Gastro-esophageal reflux disease without esophagitis: Secondary | ICD-10-CM

## 2022-07-10 NOTE — Telephone Encounter (Signed)
Requested Prescriptions  Pending Prescriptions Disp Refills   omeprazole (PRILOSEC) 20 MG capsule [Pharmacy Med Name: Omeprazole 20 MG Oral Capsule Delayed Release] 90 capsule 1    Sig: TAKE 1 CAPSULE BY MOUTH ONCE DAILY BEFORE BREAKFAST     Gastroenterology: Proton Pump Inhibitors Passed - 07/09/2022 10:07 AM      Passed - Valid encounter within last 12 months    Recent Outpatient Visits           6 months ago Other iron deficiency anemia   [place] [facility] [place], Netta Neat, DO   7 months ago SOB (shortness of breath)   [place] [facility] M[place], Oswaldo Conroy, [place]   9 months ago Annual physical exam   [place] [facility] Smitty Cords, DO   11 months ago COPD with acute exacerbation [facility])   [place] Smitty Cords, DO   1 year ago Centrilobular emphysema [facility])   [place] [facility] [place], Netta Neat, [place]

## 2022-07-16 ENCOUNTER — Other Ambulatory Visit: Payer: Self-pay | Admitting: Internal Medicine

## 2022-07-16 DIAGNOSIS — J9611 Chronic respiratory failure with hypoxia: Secondary | ICD-10-CM | POA: Diagnosis not present

## 2022-07-16 DIAGNOSIS — J431 Panlobular emphysema: Secondary | ICD-10-CM | POA: Diagnosis not present

## 2022-07-16 DIAGNOSIS — E782 Mixed hyperlipidemia: Secondary | ICD-10-CM | POA: Diagnosis not present

## 2022-07-16 DIAGNOSIS — I10 Essential (primary) hypertension: Secondary | ICD-10-CM | POA: Diagnosis not present

## 2022-07-16 DIAGNOSIS — I2089 Other forms of angina pectoris: Secondary | ICD-10-CM

## 2022-07-16 DIAGNOSIS — G4733 Obstructive sleep apnea (adult) (pediatric): Secondary | ICD-10-CM | POA: Diagnosis not present

## 2022-07-16 DIAGNOSIS — R0602 Shortness of breath: Secondary | ICD-10-CM

## 2022-07-16 DIAGNOSIS — I2510 Atherosclerotic heart disease of native coronary artery without angina pectoris: Secondary | ICD-10-CM

## 2022-07-16 DIAGNOSIS — E669 Obesity, unspecified: Secondary | ICD-10-CM | POA: Diagnosis not present

## 2022-07-16 DIAGNOSIS — K219 Gastro-esophageal reflux disease without esophagitis: Secondary | ICD-10-CM | POA: Diagnosis not present

## 2022-07-16 DIAGNOSIS — Z87891 Personal history of nicotine dependence: Secondary | ICD-10-CM | POA: Diagnosis not present

## 2022-07-26 ENCOUNTER — Ambulatory Visit: Payer: Medicare PPO | Admitting: Family Medicine

## 2022-08-07 ENCOUNTER — Telehealth (HOSPITAL_COMMUNITY): Payer: Self-pay | Admitting: *Deleted

## 2022-08-07 ENCOUNTER — Encounter (HOSPITAL_COMMUNITY): Payer: Self-pay

## 2022-08-07 MED ORDER — METOPROLOL TARTRATE 100 MG PO TABS: ORAL_TABLET | ORAL | 0 refills | Status: DC

## 2022-08-07 MED ORDER — IVABRADINE HCL 7.5 MG PO TABS: ORAL_TABLET | ORAL | 0 refills | Status: DC

## 2022-08-07 NOTE — Telephone Encounter (Signed)
Reaching out to patient to offer assistance regarding upcoming cardiac imaging study; pt verbalizes understanding of appt date/time, parking situation and where to check in, pre-test NPO status and medications ordered, and verified current allergies; name and call back number provided for further questions should they arise  Doree Kuehne RN Navigator Cardiac Imaging [facility] [phone] office [phone] cell  Patient to take 100mg metoprolol tartrate and 15mg ivabradine two hours prior to her cardiac CT scan. 

## 2022-08-09 ENCOUNTER — Ambulatory Visit
Admission: RE | Admit: 2022-08-09 | Discharge: 2022-08-09 | Disposition: A | Discharge status: Home / Self Care | Payer: Medicare PPO | Source: Ambulatory Visit | Attending: Internal Medicine | Admitting: Internal Medicine

## 2022-08-09 DIAGNOSIS — I2510 Atherosclerotic heart disease of native coronary artery without angina pectoris: Secondary | ICD-10-CM | POA: Insufficient documentation

## 2022-08-09 DIAGNOSIS — R0602 Shortness of breath: Secondary | ICD-10-CM | POA: Diagnosis not present

## 2022-08-09 DIAGNOSIS — I25118 Atherosclerotic heart disease of native coronary artery with other forms of angina pectoris: Secondary | ICD-10-CM

## 2022-08-09 DIAGNOSIS — I2089 Other forms of angina pectoris: Secondary | ICD-10-CM | POA: Insufficient documentation

## 2022-08-09 MED ORDER — IOHEXOL 350 MG/ML SOLN
100.0000 mL | Freq: Once | INTRAVENOUS | Status: AC | PRN
  Administered 2022-08-09: 100 mL via INTRAVENOUS

## 2022-08-09 MED ORDER — NITROGLYCERIN 0.4 MG SL SUBL
0.8000 mg | SUBLINGUAL_TABLET | Freq: Once | SUBLINGUAL | Status: AC
  Administered 2022-08-09: 0.8 mg via SUBLINGUAL

## 2022-08-09 NOTE — Progress Notes (Signed)
Patient tolerated procedure well. Ambulate w/o difficulty. Denies any lightheadedness or being dizzy. Pt denies any pain at this time. Sitting in chair, drinking water provided. Pt is encouraged to drink additional water throughout the day and reason explained to patient. Patient verbalized understanding and all questions answered. ABC intact. No further needs at this time. Discharge from procedure area w/o issues.  

## 2022-08-11 ENCOUNTER — Other Ambulatory Visit: Payer: Self-pay | Admitting: Family Medicine

## 2022-08-11 DIAGNOSIS — E538 Deficiency of other specified B group vitamins: Secondary | ICD-10-CM

## 2022-08-11 DIAGNOSIS — D510 Vitamin B12 deficiency anemia due to intrinsic factor deficiency: Secondary | ICD-10-CM

## 2022-08-13 NOTE — Telephone Encounter (Signed)
Requested Prescriptions  Pending Prescriptions Disp Refills   cyanocobalamin (VITAMIN B12) 1000 MCG/ML injection [Pharmacy Med Name: Cyanocobalamin 1000 MCG/ML Injection Solution] 3 mL 1    Sig: INJECT 1 ML INTO THE MUSCLE EVERY 30 DAYS     Endocrinology:  Vitamins - Vitamin B12 Passed - 08/11/2022  6:39 PM      Passed - HCT in normal range and within 360 days    HCT  Date Value Ref Range Status  01/05/2022 40.7 35.0 - 45.0 % Final  05/06/2014 40.0 35.0 - 47.0 % Final         Passed - HGB in normal range and within 360 days    Hemoglobin  Date Value Ref Range Status  01/05/2022 13.2 11.7 - 15.5 g/dL Final   HGB  Date Value Ref Range Status  05/06/2014 13.2 12.0 - 16.0 g/dL Final         Passed - B12 Level in normal range and within 360 days    Vitamin B-12  Date Value Ref Range Status  01/05/2022 692 200 - 1,100 pg/mL Final         Passed - Valid encounter within last 12 months    Recent Outpatient Visits           7 months ago Other iron deficiency anemia   [facility] [facility] Smitty Cords, DO   8 months ago SOB (shortness of breath)   [facility], Oswaldo Conroy, [place]   10 months ago Annual physical exam   [place] [facility] Smitty Cords, DO   1 year ago COPD with acute exacerbation [facility])   [place] [facility] Smitty Cords, DO   1 year ago Centrilobular emphysema [facility])   [facility], Netta Neat, [place]

## 2022-08-29 DIAGNOSIS — E782 Mixed hyperlipidemia: Secondary | ICD-10-CM | POA: Diagnosis not present

## 2022-08-29 DIAGNOSIS — I10 Essential (primary) hypertension: Secondary | ICD-10-CM | POA: Diagnosis not present

## 2022-08-29 DIAGNOSIS — R0602 Shortness of breath: Secondary | ICD-10-CM | POA: Diagnosis not present

## 2022-08-29 DIAGNOSIS — E669 Obesity, unspecified: Secondary | ICD-10-CM | POA: Diagnosis not present

## 2022-08-29 DIAGNOSIS — K219 Gastro-esophageal reflux disease without esophagitis: Secondary | ICD-10-CM | POA: Diagnosis not present

## 2022-08-29 DIAGNOSIS — J431 Panlobular emphysema: Secondary | ICD-10-CM | POA: Diagnosis not present

## 2022-08-29 DIAGNOSIS — Z87891 Personal history of nicotine dependence: Secondary | ICD-10-CM | POA: Diagnosis not present

## 2022-08-29 DIAGNOSIS — I2510 Atherosclerotic heart disease of native coronary artery without angina pectoris: Secondary | ICD-10-CM | POA: Diagnosis not present

## 2022-08-29 DIAGNOSIS — G4733 Obstructive sleep apnea (adult) (pediatric): Secondary | ICD-10-CM | POA: Diagnosis not present

## 2022-09-13 ENCOUNTER — Ambulatory Visit (INDEPENDENT_AMBULATORY_CARE_PROVIDER_SITE_OTHER): Payer: Medicare PPO

## 2022-09-13 VITALS — Ht 62.0 in | Wt 187.0 lb

## 2022-09-13 DIAGNOSIS — Z0000 Encounter for general adult medical examination without abnormal findings: Secondary | ICD-10-CM

## 2022-09-13 NOTE — Patient Instructions (Signed)
Elizabeth [name] , Thank you for taking time to come for your Medicare Wellness Visit. I appreciate your ongoing commitment to your health goals. Please review the following plan we discussed and let me know if I can assist you in the future.   These are the goals we discussed:  Goals      DIET - EAT MORE FRUITS AND VEGETABLES        This is a list of the screening recommended for you and due dates:  Health Maintenance  Topic Date Due   Hepatitis C Screening  Never done   Zoster (Shingles) Vaccine (1 of 2) Never done   DEXA scan (bone density measurement)  Never done   DTaP/Tdap/Td vaccine (3 - Td or Tdap) 09/25/2020   COVID-19 Vaccine (8 - 2023-24 season) 12/01/2021   Flu Shot  11/01/2022   Mammogram  11/08/2022   Medicare Annual Wellness Visit  09/13/2023   Cologuard (Stool DNA test)  09/14/2024   Pneumonia Vaccine  Completed   HPV Vaccine  Aged Out    Advanced directives: no  Conditions/risks identified: none  Next appointment: Follow up in one year for your annual wellness visit 09/19/23 @ 1:30 pm by phone   Preventive Care 65 Years and Older, Female Preventive care refers to lifestyle choices and visits with your health care provider that can promote health and wellness. What does preventive care include? A yearly physical exam. This is also called an annual well check. Dental exams once or twice a year. Routine eye exams. Ask your health care provider how often you should have your eyes checked. Personal lifestyle choices, including: Daily care of your teeth and gums. Regular physical activity. Eating a healthy diet. Avoiding tobacco and drug use. Limiting alcohol use. Practicing safe sex. Taking low-dose aspirin every day. Taking vitamin and mineral supplements as recommended by your health care provider. What happens during an annual well check? The services and screenings done by your health care provider during your annual well check will depend on your age,  overall health, lifestyle risk factors, and family history of disease. Counseling  Your health care provider may ask you questions about your: Alcohol use. Tobacco use. Drug use. Emotional well-being. Home and relationship well-being. Sexual activity. Eating habits. History of falls. Memory and ability to understand (cognition). Work and work Astronomer. Reproductive health. Screening  You may have the following tests or measurements: Height, weight, and BMI. Blood pressure. Lipid and cholesterol levels. These may be checked every 5 years, or more frequently if you are over 5 years old. Skin check. Lung cancer screening. You may have this screening every year starting at age 86 if you have a 30-pack-year history of smoking and currently smoke or have quit within the past 15 years. Fecal occult blood test (FOBT) of the stool. You may have this test every year starting at age 44. Flexible sigmoidoscopy or colonoscopy. You may have a sigmoidoscopy every 5 years or a colonoscopy every 10 years starting at age 22. Hepatitis C blood test. Hepatitis B blood test. Sexually transmitted disease (STD) testing. Diabetes screening. This is done by checking your blood sugar (glucose) after you have not eaten for a while (fasting). You may have this done every 1-3 years. Bone density scan. This is done to screen for osteoporosis. You may have this done starting at age 31. Mammogram. This may be done every 1-2 years. Talk to your health care provider about how often you should have regular mammograms. Talk with  your health care provider about your test results, treatment options, and if necessary, the need for more tests. Vaccines  Your health care provider may recommend certain vaccines, such as: Influenza vaccine. This is recommended every year. Tetanus, diphtheria, and acellular pertussis (Tdap, Td) vaccine. You may need a Td booster every 10 years. Zoster vaccine. You may need this after age  52. Pneumococcal 13-valent conjugate (PCV13) vaccine. One dose is recommended after age 23. Pneumococcal polysaccharide (PPSV23) vaccine. One dose is recommended after age 25. Talk to your health care provider about which screenings and vaccines you need and how often you need them. This information is not intended to replace advice given to you by your health care provider. Make sure you discuss any questions you have with your health care provider. Document Released: 04/15/2015 Document Revised: 12/07/2015 Document Reviewed: 01/18/2015 Elsevier Interactive Patient Education  2017 [place] Prevention in the Home Falls can cause injuries. They can happen to people of all ages. There are many things you can do to make your home safe and to help prevent falls. What can I do on the outside of my home? Regularly fix the edges of walkways and driveways and fix any cracks. Remove anything that might make you trip as you walk through a door, such as a raised step or threshold. Trim any bushes or trees on the path to your home. Use bright outdoor lighting. Clear any walking paths of anything that might make someone trip, such as rocks or tools. Regularly check to see if handrails are loose or broken. Make sure that both sides of any steps have handrails. Any raised decks and porches should have guardrails on the edges. Have any leaves, snow, or ice cleared regularly. Use sand or salt on walking paths during winter. Clean up any spills in your garage right away. This includes oil or grease spills. What can I do in the bathroom? Use night lights. Install grab bars by the toilet and in the tub and shower. Do not use towel bars as grab bars. Use non-skid mats or decals in the tub or shower. If you need to sit down in the shower, use a plastic, non-slip stool. Keep the floor dry. Clean up any water that spills on the floor as soon as it happens. Remove soap buildup in the tub or shower  regularly. Attach bath mats securely with double-sided non-slip rug tape. Do not have throw rugs and other things on the floor that can make you trip. What can I do in the bedroom? Use night lights. Make sure that you have a light by your bed that is easy to reach. Do not use any sheets or blankets that are too big for your bed. They should not hang down onto the floor. Have a firm chair that has side arms. You can use this for support while you get dressed. Do not have throw rugs and other things on the floor that can make you trip. What can I do in the kitchen? Clean up any spills right away. Avoid walking on wet floors. Keep items that you use a lot in easy-to-reach places. If you need to reach something above you, use a strong step stool that has a grab bar. Keep electrical cords out of the way. Do not use floor polish or wax that makes floors slippery. If you must use wax, use non-skid floor wax. Do not have throw rugs and other things on the floor that can make you trip.  What can I do with my stairs? Do not leave any items on the stairs. Make sure that there are handrails on both sides of the stairs and use them. Fix handrails that are broken or loose. Make sure that handrails are as long as the stairways. Check any carpeting to make sure that it is firmly attached to the stairs. Fix any carpet that is loose or worn. Avoid having throw rugs at the top or bottom of the stairs. If you do have throw rugs, attach them to the floor with carpet tape. Make sure that you have a light switch at the top of the stairs and the bottom of the stairs. If you do not have them, ask someone to add them for you. What else can I do to help prevent falls? Wear shoes that: Do not have high heels. Have rubber bottoms. Are comfortable and fit you well. Are closed at the toe. Do not wear sandals. If you use a stepladder: Make sure that it is fully opened. Do not climb a closed stepladder. Make sure that  both sides of the stepladder are locked into place. Ask someone to hold it for you, if possible. Clearly mark and make sure that you can see: Any grab bars or handrails. First and last steps. Where the edge of each step is. Use tools that help you move around (mobility aids) if they are needed. These include: Canes. Walkers. Scooters. Crutches. Turn on the lights when you go into a dark area. Replace any light bulbs as soon as they burn out. Set up your furniture so you have a clear path. Avoid moving your furniture around. If any of your floors are uneven, fix them. If there are any pets around you, be aware of where they are. Review your medicines with your doctor. Some medicines can make you feel dizzy. This can increase your chance of falling. Ask your doctor what other things that you can do to help prevent falls. This information is not intended to replace advice given to you by your health care provider. Make sure you discuss any questions you have with your health care provider. Document Released: 01/13/2009 Document Revised: 08/25/2015 Document Reviewed: 04/23/2014 Elsevier Interactive Patient Education  2017 [employer].

## 2022-09-13 NOTE — Progress Notes (Signed)
I connected with  Langley Adie on 09/13/22 by a audio enabled telemedicine application and verified that I am speaking with the correct person using two identifiers.  Patient Location: Home  Provider Location: Office/Clinic  I discussed the limitations of evaluation and management by telemedicine. The patient expressed understanding and agreed to proceed.  Subjective:   Makenli Derstine is a 72 y.o. female who presents for Medicare Annual (Subsequent) preventive examination.  Review of Systems     Cardiac Risk Factors include: advanced age (>83men, >8 women);dyslipidemia;hypertension;sedentary lifestyle     Objective:    Today's Vitals   09/13/22 1411  Weight: 187 lb (84.8 kg)  Height: 5\' 2"  (1.575 m)   Body mass index is 34.2 kg/m.     09/13/2022    2:01 PM 04/23/2022   10:53 AM 10/20/2020   11:32 AM 04/20/2020   10:16 AM 10/22/2019   10:15 AM 07/08/2019    1:28 PM 06/19/2019    9:01 AM  Advanced Directives  Does Patient Have a Medical Advance Directive? No No No Yes Yes Yes Yes  Type of Theme park manager;Living will Living will;Healthcare Power of [employer] Power of [employer] Power of Attorney  Does patient want to make changes to medical advance directive?       Yes (MAU/Ambulatory/Procedural Areas - Information given)  Copy of Healthcare Power of Attorney in Chart?      No - copy requested   Would patient like information on creating a medical advance directive? No - Patient declined No - Patient declined         Current Medications (verified) Outpatient Encounter Medications as of 09/13/2022  Medication Sig   albuterol (PROVENTIL) (2.5 MG/3ML) 0.083% nebulizer solution USE 1 VIAL IN NEBULIZER EVERY 6 HOURS AS NEEDED FOR WHEEZING AND FOR SHORTNESS OF BREATH   albuterol (VENTOLIN HFA) 108 (90 Base) MCG/ACT inhaler Inhale 2 puffs into the lungs every 6 (six) hours as needed for wheezing or shortness of breath.    ARIPiprazole (ABILIFY) 5 MG tablet Take 1 tablet (5 mg total) by mouth daily.   budesonide-formoterol (SYMBICORT) 160-4.5 MCG/ACT inhaler Inhale 2 puffs into the lungs 2 (two) times daily.   cyanocobalamin (VITAMIN B12) 1000 MCG/ML injection INJECT 1 ML INTO THE MUSCLE EVERY 30 DAYS   fluticasone (FLONASE) 50 MCG/ACT nasal spray Place 2 sprays into both nostrils daily.   furosemide (LASIX) 20 MG tablet Take 20 mg by mouth daily.   losartan (COZAAR) 50 MG tablet Take 1 tablet (50 mg total) by mouth daily.   Multiple Vitamin (MULTI-VITAMINS) TABS Take by mouth.   omeprazole (PRILOSEC) 20 MG capsule TAKE 1 CAPSULE BY MOUTH ONCE DAILY BEFORE BREAKFAST   potassium chloride SA (KLOR-CON M) 20 MEQ tablet Take 1 tablet (20 mEq total) by mouth daily.   simvastatin (ZOCOR) 80 MG tablet Take 1 tablet (80 mg total) by mouth at bedtime.   tiotropium (SPIRIVA) 18 MCG inhalation capsule Place 1 capsule (18 mcg total) into inhaler and inhale daily.   venlafaxine XR (EFFEXOR XR) 75 MG 24 hr capsule Take 3 capsules (225 mg total) by mouth daily with breakfast.   ivabradine (CORLANOR) 7.5 MG TABS tablet Take tablets (15mg ) TWO hours prior to your cardiac CT scan.   metoprolol tartrate (LOPRESSOR) 100 MG tablet Take tablet (100mg ) TWO hours prior to her cardiac CT scan.   No facility-administered encounter medications on file as of 09/13/2022.    Allergies (verified) Moxifloxacin, Levofloxacin, and  Amoxicillin-pot clavulanate   History: Past Medical History:  Diagnosis Date   Allergy    Anxiety    B12 deficiency    Carpal tunnel syndrome    Centrilobular emphysema (HCC)    COPD (chronic obstructive pulmonary disease) (HCC)    Depression    Genetic testing    Common Cancers panel (47 genes) @ [facility] - No pathogenic mutations detected   HLD (hyperlipidemia)    Hyperlipidemia    Hypertension    Iron deficiency anemia 10/16/2016   Lipoma of skin    Lung nodule    Normocytic anemia 10/16/2016   QT  prolongation    Substance abuse (HCC)    Past Surgical History:  Procedure Laterality Date   ABDOMINAL HYSTERECTOMY     BREAST EXCISIONAL BIOPSY Left    1980's neg   CESAREAN SECTION     TUBAL LIGATION     Family History  Problem Relation Age of Onset   Ovarian cancer Mother 37       deceased 70   Breast cancer Mother 72   Stroke Father    Lung cancer Paternal Aunt    Lung cancer Cousin    Social History   Socioeconomic History   Marital status: Divorced    Spouse name: Not on file   Number of children: 1   Years of education: Not on file   Highest education level: Associate degree: academic program  Occupational History   Occupation: Retired  Tobacco Use   Smoking status: Every Day    Packs/day: 0.50    Years: 52.00    Additional pack years: 0.00    Total pack years: 26.00    Types: Cigarettes   Smokeless tobacco: Never   Tobacco comments:    5 ciggarette a day--03/02/2020  Vaping Use   Vaping Use: Former  Substance and Sexual Activity   Alcohol use: No   Drug use: No   Sexual activity: Not Currently  Other Topics Concern   Not on file  Social History Narrative   Not on file   Social Determinants of Health   Financial Resource Strain: Low Risk  (09/13/2022)   Overall Financial Resource Strain (CARDIA)    Difficulty of Paying Living Expenses: Not hard at all  Food Insecurity: No Food Insecurity (09/13/2022)   Hunger Vital Sign    Worried About Running Out of Food in the Last Year: Never true    Ran Out of Food in the Last Year: Never true  Transportation Needs: No Transportation Needs (09/13/2022)   PRAPARE - Administrator, Civil Service (Medical): No    Lack of Transportation (Non-Medical): No  Physical Activity: Inactive (09/13/2022)   Exercise Vital Sign    Days of Exercise per Week: 0 days    Minutes of Exercise per Session: 0 min  Stress: No Stress Concern Present (09/13/2022)   [employer] of Occupational Health - Occupational  Stress Questionnaire    Feeling of Stress : Only a little  Social Connections: Socially Isolated (09/13/2022)   Social Connection and Isolation Panel [NHANES]    Frequency of Communication with Friends and Family: Twice a week    Frequency of Social Gatherings with Friends and Family: More than three times a week    Attends Religious Services: Never    Database administrator or Organizations: No    Attends Banker Meetings: Never    Marital Status: Divorced    Tobacco Counseling Ready to quit: Not Answered  Counseling given: Not Answered Tobacco comments: 5 ciggarette a day--03/02/2020   Clinical Intake:  Pre-visit preparation completed: Yes  Pain : No/denies pain     Nutritional Risks: None Diabetes: No  How often do you need to have someone help you when you read instructions, pamphlets, or other written materials from your doctor or pharmacy?: 1 - Never  Diabetic?no  Interpreter Needed?: No  Information entered by :: Kennedy Bucker, LPN   Activities of Daily Living    09/13/2022    2:03 PM 09/09/2022    6:00 PM  In your present state of health, do you have any difficulty performing the following activities:  Hearing? 0 0  Vision? 1 1  Difficulty concentrating or making decisions? 0 0  Walking or climbing stairs? 1 1  Comment breathing   Dressing or bathing? 0 0  Doing errands, shopping? 0 0  Preparing Food and eating ? N N  Using the Toilet? N N  In the past six months, have you accidently leaked urine? N N  Do you have problems with loss of bowel control? N N  Managing your Medications? N N  Managing your Finances? N N  Housekeeping or managing your Housekeeping? N N    Patient Care Team: Smitty Cords, DO as PCP - General (Family Medicine) Glory Buff, RN as Oncology Nurse Navigator Carmina Miller, MD as Radiation Oncologist (Radiation Oncology)  Indicate any recent Medical Services you may have received from other than [facility]  providers in the past year (date may be approximate).     Assessment:   This is a routine wellness examination for Jimya.  Hearing/Vision screen Hearing Screening - Comments:: No aids Vision Screening - Comments:: Readers- [facility]  Dietary issues and exercise activities discussed: Current Exercise Habits: The patient does not participate in regular exercise at present, Exercise limited by: respiratory conditions(s)   Goals Addressed             This Visit's Progress    DIET - EAT MORE FRUITS AND VEGETABLES         Depression Screen    09/13/2022    2:00 PM 01/05/2022   10:57 AM 10/06/2021   10:24 AM 08/04/2021   11:14 AM 07/04/2021    9:57 AM  PHQ 2/9 Scores  PHQ - 2 Score 1 2 2 2 2   PHQ- 9 Score 3 9 8 7 7     Fall Risk    09/13/2022    2:02 PM 09/09/2022    6:00 PM 10/06/2021   10:24 AM 09/07/2021    2:35 PM 08/04/2021   11:13 AM  Fall Risk   Falls in the past year? 1 1 0 0 0  Number falls in past yr: 0 0 0 0 0  Injury with Fall? 0 0 0 0 0  Risk for fall due to : History of fall(s)  History of fall(s) No Fall Risks No Fall Risks  Follow up Falls evaluation completed;Falls prevention discussed  Falls evaluation completed Falls evaluation completed Falls evaluation completed    FALL RISK PREVENTION PERTAINING TO THE HOME:  Any stairs in or around the home? Yes  If so, are there any without handrails? No  Home free of loose throw rugs in walkways, pet beds, electrical cords, etc? Yes  Adequate lighting in your home to reduce risk of falls? Yes   ASSISTIVE DEVICES UTILIZED TO PREVENT FALLS:  Life alert? No  Use of a cane, walker or w/c? No  Grab bars in the bathroom? Yes  Shower chair or bench in shower? No  Elevated toilet seat or a handicapped toilet? Yes   Cognitive Function:        09/13/2022    2:07 PM 09/07/2021    2:36 PM  6CIT Screen  What Year? 0 points 0 points  What month? 0 points 0 points  What time? 0 points 0 points  Count back from 20 0  points 0 points  Months in reverse 0 points 0 points  Repeat phrase 2 points 0 points  Total Score 2 points 0 points    Immunizations Immunization History  Administered Date(s) Administered   Fluad Quad(high Dose 65+) 12/21/2020, 01/05/2022   Influenza Split 01/14/2009   Influenza, High Dose Seasonal PF 01/15/2017, 01/01/2020   Influenza, Seasonal, Injecte, Preservative Fre 01/20/2010, 12/07/2011   Influenza,inj,Quad PF,6+ Mos 01/07/2019   Influenza-Unspecified 03/16/2008, 12/28/2010, 01/03/2012, 12/25/2012, 12/15/2014, 01/14/2016, 01/15/2017, 12/31/2017   PFIZER Comirnaty(Gray Top)Covid-19 Tri-Sucrose Vaccine 04/28/2019, 05/19/2019, 01/01/2020   PFIZER(Purple Top)SARS-COV-2 Vaccination 04/28/2019, 05/19/2019, 01/01/2020   Pfizer Covid-19 Vaccine Bivalent Booster 29yrs & up 03/07/2021   Pneumococcal Conjugate-13 02/16/2014   Pneumococcal Polysaccharide-23 09/01/2009, 04/27/2016   Rsv, Bivalent, Protein Subunit Rsvpref,pf Verdis Frederickson) 01/10/2022   Tdap 12/01/2009, 09/26/2010    TDAP status: Due, Education has been provided regarding the importance of this vaccine. Advised may receive this vaccine at local pharmacy or Health Dept. Aware to provide a copy of the vaccination record if obtained from local pharmacy or Health Dept. Verbalized acceptance and understanding.  Flu Vaccine status: Up to date  Pneumococcal vaccine status: Up to date  Covid-19 vaccine status: Completed vaccines  Qualifies for Shingles Vaccine? Yes   Zostavax completed No   Shingrix Completed?: No.    Education has been provided regarding the importance of this vaccine. Patient has been advised to call insurance company to determine out of pocket expense if they have not yet received this vaccine. Advised may also receive vaccine at local pharmacy or Health Dept. Verbalized acceptance and understanding.  Screening Tests Health Maintenance  Topic Date Due   Hepatitis C Screening  Never done   Zoster Vaccines-  Shingrix (1 of 2) Never done   DEXA SCAN  Never done   DTaP/Tdap/Td (3 - Td or Tdap) 09/25/2020   COVID-19 Vaccine (8 - 2023-24 season) 12/01/2021   INFLUENZA VACCINE  11/01/2022   MAMMOGRAM  11/08/2022   Medicare Annual Wellness (AWV)  09/13/2023   Fecal DNA (Cologuard)  09/14/2024   Pneumonia Vaccine 61+ Years old  Completed   HPV VACCINES  Aged Out    Health Maintenance  Health Maintenance Due  Topic Date Due   Hepatitis C Screening  Never done   Zoster Vaccines- Shingrix (1 of 2) Never done   DEXA SCAN  Never done   DTaP/Tdap/Td (3 - Td or Tdap) 09/25/2020   COVID-19 Vaccine (8 - 2023-24 season) 12/01/2021    Colorectal cancer screening: Type of screening: Cologuard. Completed 09/14/21. Repeat every 3 years  Mammogram status: Completed 11/07/21. Repeat every year  Declined referral for BDS  Lung Cancer Screening: (Low Dose CT Chest recommended if Age 99-80 years, 30 pack-year currently smoking OR have quit w/in 15years.) does not qualify.    Additional Screening:  Hepatitis C Screening: does qualify; Completed no  Vision Screening: Recommended annual ophthalmology exams for early detection of glaucoma and other disorders of the eye. Is the patient up to date with their annual eye exam?  Yes  Who is  the provider or what is the name of the office in which the patient attends annual eye exams? Dr.Bulakowski If pt is not established with a provider, would they like to be referred to a provider to establish care? No .   Dental Screening: Recommended annual dental exams for proper oral hygiene  Community Resource Referral / Chronic Care Management: CRR required this visit?  No   CCM required this visit?  No      Plan:     I have personally reviewed and noted the following in the patient's chart:   Medical and social history Use of alcohol, tobacco or illicit drugs  Current medications and supplements including opioid prescriptions. Patient is not currently taking  opioid prescriptions. Functional ability and status Nutritional status Physical activity Advanced directives List of other physicians Hospitalizations, surgeries, and ER visits in previous 12 months Vitals Screenings to include cognitive, depression, and falls Referrals and appointments  In addition, I have reviewed and discussed with patient certain preventive protocols, quality metrics, and best practice recommendations. A written personalized care plan for preventive services as well as general preventive health recommendations were provided to patient.     Hal Hope, LPN   1/61/0960   Nurse Notes: none

## 2022-09-14 ENCOUNTER — Other Ambulatory Visit: Payer: Self-pay | Admitting: Family Medicine

## 2022-09-14 DIAGNOSIS — F331 Major depressive disorder, recurrent, moderate: Secondary | ICD-10-CM

## 2022-09-14 NOTE — Telephone Encounter (Signed)
Requested medication (s) are due for refill today - expired Rx  Requested medication (s) are on the active medication list -yes  Future visit scheduled -yes  Last refill: 07/04/21 #270 [id number]  Notes to clinic: expired Rx  Requested Prescriptions  Pending Prescriptions Disp Refills   venlafaxine XR (EFFEXOR-XR) 75 MG 24 hr capsule [Pharmacy Med Name: Venlafaxine HCl ER 75 MG Oral Capsule Extended Release 24 Hour] 270 capsule 0    Sig: TAKE 3 CAPSULES BY MOUTH ONCE DAILY WITH BREAKFAST     Psychiatry: Antidepressants - SNRI - desvenlafaxine & venlafaxine Failed - 09/14/2022  7:06 AM      Failed - Lipid Panel in normal range within the last 12 months    Cholesterol  Date Value Ref Range Status  09/28/2021 181 <200 mg/dL Final   LDL Cholesterol (Calc)  Date Value Ref Range Status  09/28/2021 99 mg/dL (calc) Final    Comment:    Reference range: <100 . Desirable range <100 mg/dL for primary prevention;   <70 mg/dL for patients with CHD or diabetic patients  with > or = 2 CHD risk factors. Marland Kitchen LDL-C is now calculated using the Martin-Hopkins  calculation, which is a validated novel method providing  better accuracy than the Friedewald equation in the  estimation of LDL-C.  Horald Pollen et al. Lenox Ahr. 8119;147(82): 2061-2068  (http://education.[facility].com/faq/FAQ164)    HDL  Date Value Ref Range Status  09/28/2021 55 > OR = 50 mg/dL Final   Triglycerides  Date Value Ref Range Status  09/28/2021 171 (H) <150 mg/dL Final         Passed - Cr in normal range and within 360 days    Creat  Date Value Ref Range Status  01/05/2022 0.85 0.60 - 1.00 mg/dL Final   Creatinine, Ser  Date Value Ref Range Status  04/19/2022 0.90 0.44 - 1.00 mg/dL Final         Passed - Completed PHQ-2 or PHQ-9 in the last 360 days      Passed - Last BP in normal range    BP Readings from Last 1 Encounters:  08/09/22 119/65         Passed - Valid encounter within last 6 months    Recent  Outpatient Visits           8 months ago Other iron deficiency anemia   [place] [facility] Smitty Cords, DO   9 months ago SOB (shortness of breath)   [place] [facility] Mecum, Oswaldo Conroy, [place]   11 months ago Annual physical exam   [place] [facility] Smitty Cords, DO   1 year ago COPD with acute exacerbation [facility])   [place] [facility] Smitty Cords, DO   1 year ago Centrilobular emphysema [facility])   [place] [facility] Althea Charon, Netta Neat, DO       Future Appointments             In 3 days Althea Charon, Netta Neat, DO [place] [facility], [facility]               Requested Prescriptions  Pending Prescriptions Disp Refills   venlafaxine XR (EFFEXOR-XR) 75 MG 24 hr capsule [Pharmacy Med Name: Venlafaxine HCl ER 75 MG Oral Capsule Extended Release 24 Hour] 270 capsule 0    Sig: TAKE 3 CAPSULES BY MOUTH ONCE DAILY WITH BREAKFAST     Psychiatry:  Antidepressants - SNRI - desvenlafaxine & venlafaxine Failed - 09/14/2022  7:06 AM      Failed - Lipid Panel in normal range within the last 12 months    Cholesterol  Date Value Ref Range Status  09/28/2021 181 <200 mg/dL Final   LDL Cholesterol (Calc)  Date Value Ref Range Status  09/28/2021 99 mg/dL (calc) Final    Comment:    Reference range: <100 . Desirable range <100 mg/dL for primary prevention;   <70 mg/dL for patients with CHD or diabetic patients  with > or = 2 CHD risk factors. Marland Kitchen LDL-C is now calculated using the Martin-Hopkins  calculation, which is a validated novel method providing  better accuracy than the Friedewald equation in the  estimation of LDL-C.  Horald Pollen et al. Lenox Ahr. 2841;324(40): 2061-2068  (http://education.[facility].com/faq/FAQ164)    HDL  Date Value Ref Range Status  09/28/2021 55 > OR = 50 mg/dL Final   Triglycerides  Date Value Ref  Range Status  09/28/2021 171 (H) <150 mg/dL Final         Passed - Cr in normal range and within 360 days    Creat  Date Value Ref Range Status  01/05/2022 0.85 0.60 - 1.00 mg/dL Final   Creatinine, Ser  Date Value Ref Range Status  04/19/2022 0.90 0.44 - 1.00 mg/dL Final         Passed - Completed PHQ-2 or PHQ-9 in the last 360 days      Passed - Last BP in normal range    BP Readings from Last 1 Encounters:  08/09/22 119/65         Passed - Valid encounter within last 6 months    Recent Outpatient Visits           8 months ago Other iron deficiency anemia   [place] [facility] Smitty Cords, DO   9 months ago SOB (shortness of breath)   [place] [facility] Mecum, Oswaldo Conroy, [place]   11 months ago Annual physical exam   [place] [facility] Smitty Cords, DO   1 year ago COPD with acute exacerbation [facility])   [place] [facility] Smitty Cords, DO   1 year ago Centrilobular emphysema [facility])   [place] [facility] Althea Charon, Netta Neat, DO       Future Appointments             In 3 days Althea Charon, Netta Neat, DO [place] [facility], [facility]

## 2022-09-17 ENCOUNTER — Ambulatory Visit (INDEPENDENT_AMBULATORY_CARE_PROVIDER_SITE_OTHER): Payer: Medicare PPO | Admitting: Family Medicine

## 2022-09-17 ENCOUNTER — Encounter: Payer: Self-pay | Admitting: Family Medicine

## 2022-09-17 VITALS — BP 118/80 | HR 85 | Ht 62.0 in | Wt 191.0 lb

## 2022-09-17 DIAGNOSIS — R7303 Prediabetes: Secondary | ICD-10-CM | POA: Diagnosis not present

## 2022-09-17 DIAGNOSIS — J432 Centrilobular emphysema: Secondary | ICD-10-CM | POA: Diagnosis not present

## 2022-09-17 DIAGNOSIS — E559 Vitamin D deficiency, unspecified: Secondary | ICD-10-CM

## 2022-09-17 DIAGNOSIS — E049 Nontoxic goiter, unspecified: Secondary | ICD-10-CM

## 2022-09-17 DIAGNOSIS — F331 Major depressive disorder, recurrent, moderate: Secondary | ICD-10-CM | POA: Diagnosis not present

## 2022-09-17 DIAGNOSIS — E538 Deficiency of other specified B group vitamins: Secondary | ICD-10-CM | POA: Diagnosis not present

## 2022-09-17 DIAGNOSIS — D508 Other iron deficiency anemias: Secondary | ICD-10-CM | POA: Diagnosis not present

## 2022-09-17 MED ORDER — BREZTRI AEROSPHERE 160-9-4.8 MCG/ACT IN AERO
2.0000 | INHALATION_SPRAY | Freq: Two times a day (BID) | RESPIRATORY_TRACT | 11 refills | Status: DC
Start: 2022-09-17 — End: 2022-12-13

## 2022-09-17 MED ORDER — REXULTI 1 MG PO TABS
1.0000 mg | ORAL_TABLET | Freq: Every day | ORAL | 0 refills | Status: DC
Start: 2022-09-17 — End: 2022-12-13

## 2022-09-17 NOTE — Patient Instructions (Addendum)
Thank you for coming to the office today.  Stop Symbicort and Spiriva Switch to Breztri 2 puff twice a day. I will order it as well  We can try to switch Abilify to Rexulti once daily, if it is covered.  Keep on Venlafaxine.  Labs today.  Please schedule a Follow-up Appointment to: Return in about 4 weeks (around 10/15/2022), or if symptoms worsen or fail to improve, for 4-6 weeks follow-up mood updates.  If you have any other questions or concerns, please feel free to call the office or send a message through MyChart. You may also schedule an earlier appointment if necessary.  Additionally, you may be receiving a survey about your experience at our office within a few days to 1 week by e-mail or mail. We value your feedback.  Saralyn Pilar, DO [facility], [place]

## 2022-09-17 NOTE — Progress Notes (Signed)
Subjective:    Patient ID: Elizabeth [name], female    DOB: 14-Mar-1951, 72 y.o.   MRN: [id number]  Elizabeth [name] is a 72 y.o. female presenting on 09/17/2022 for Fatigue (Present for a few months), Headache (Last 2-3 weeks), and Depression   HPI  Anemia Last lab 04/2022 Due for labs today  Major Depression recurrent No energy, depression, headache No known trigger, episodic flares depression before. Eating more, gaining weight Dyspnea at times Taking Venlafaxine 75mg  x 3 On Abilify 5mg  daily  Interested in other med options and labs      09/17/2022    9:46 AM 09/13/2022    2:00 PM 01/05/2022   10:57 AM  Depression screen PHQ 2/9  Decreased Interest 3  1  Down, Depressed, Hopeless 3 1 1   PHQ - 2 Score 6 1 2   Altered sleeping 3 1 1   Tired, decreased energy 3 1 3   Change in appetite 2  2  Feeling bad or failure about yourself  0  0  Trouble concentrating 1  1  Moving slowly or fidgety/restless 0  0  Suicidal thoughts 0  0  PHQ-9 Score 15 3 9   Difficult doing work/chores  Not difficult at all Not difficult at all      09/17/2022    9:47 AM 10/06/2021   10:25 AM 08/04/2021   11:14 AM 07/04/2021    9:57 AM  GAD 7 : Generalized Anxiety Score  Nervous, Anxious, on Edge 3 0  1  Control/stop worrying 2 1 1 1   Worry too much - different things 1 1 1 1   Trouble relaxing 1 1 1 1   Restless 0 0 0 0  Easily annoyed or irritable 3 1 1 1   Afraid - awful might happen 0 0 0 0  Total GAD 7 Score 10 4  5   Anxiety Difficulty   Not difficult at all Not difficult at all      Social History   Tobacco Use   Smoking status: Every Day    Packs/day: 0.50    Years: 52.00    Additional pack years: 0.00    Total pack years: 26.00    Types: Cigarettes   Smokeless tobacco: Never   Tobacco comments:    5 ciggarette a day--03/02/2020  Vaping Use   Vaping Use: Some days  Substance Use Topics   Alcohol use: No   Drug use: No    Review of Systems Per HPI unless specifically indicated  above     Objective:    BP 118/80   Pulse 85   Ht 5\' 2"  (1.575 m)   Wt 191 lb (86.6 kg)   SpO2 95%   BMI 34.93 kg/m   Wt Readings from Last 3 Encounters:  09/17/22 191 lb (86.6 kg)  09/13/22 187 lb (84.8 kg)  04/23/22 187 lb 14.4 oz (85.2 kg)    Physical Exam Vitals and nursing note reviewed.  Constitutional:      General: She is not in acute distress.    Appearance: Normal appearance. She is well-developed. She is not diaphoretic.     Comments: Well-appearing, comfortable, cooperative  HENT:     Head: Normocephalic and atraumatic.  Eyes:     General:        Right eye: No discharge.        Left eye: No discharge.     Conjunctiva/sclera: Conjunctivae normal.  Neck:     Thyroid: No thyromegaly.  Cardiovascular:     Rate and  Rhythm: Normal rate and regular rhythm.     Heart sounds: Normal heart sounds. No murmur heard. Pulmonary:     Effort: Pulmonary effort is normal. No respiratory distress.     Breath sounds: Normal breath sounds. No wheezing or rales.  Musculoskeletal:        General: Normal range of motion.     Cervical back: Normal range of motion and neck supple.  Lymphadenopathy:     Cervical: No cervical adenopathy.  Skin:    General: Skin is warm and dry.     Findings: No erythema or rash.  Neurological:     Mental Status: She is alert and oriented to person, place, and time.  Psychiatric:        Mood and Affect: Mood is depressed.        Behavior: Behavior normal.        Thought Content: Thought content normal.     Comments: Well groomed, good eye contact, normal speech and thoughts    Results for orders placed or performed during the hospital encounter of 04/19/22  I-STAT creatinine  Result Value Ref Range   Creatinine, Ser 0.90 0.44 - 1.00 mg/dL      Assessment & Plan:   Problem List Items Addressed This Visit     B12 deficiency   Relevant Orders   Vitamin B12   Centrilobular emphysema (HCC)   Relevant Medications   BREZTRI AEROSPHERE  160-9-4.8 MCG/ACT AERO   Iron deficiency anemia - Primary   Relevant Orders   CBC with Differential/Platelet   Iron, TIBC and Ferritin Panel   Major depressive disorder, recurrent, moderate (HCC)   Relevant Medications   REXULTI 1 MG TABS tablet   Pre-diabetes   Relevant Orders   COMPLETE METABOLIC PANEL WITH GFR   Hemoglobin A1c   Other Visit Diagnoses     Vitamin D deficiency       Relevant Orders   VITAMIN D 25 Hydroxy (Vit-D Deficiency, Fractures)   Thyroid goiter       Relevant Orders   TSH   T4, free       Emphysema COPD Stop Symbicort and Spiriva Switch to Breztri 2 puff twice a day, rx sent, no samples in stock today  Major Depression Recurrent Flared up Continue SNRI Venlafaxine We can try to switch Abilify to Rexulti once daily, if it is covered. Rx Rexulti 1mg  daily, after 1 month we can adjust to 2mg  if indicated  Anemia Check labs   Meds ordered this encounter  Medications   BREZTRI AEROSPHERE 160-9-4.8 MCG/ACT AERO    Sig: Inhale 2 puffs into the lungs 2 (two) times daily.    Dispense:  10.7 g    Refill:  11   REXULTI 1 MG TABS tablet    Sig: Take 1 tablet (1 mg total) by mouth daily.    Dispense:  30 tablet    Refill:  0      Follow up plan: Return in about 4 weeks (around 10/15/2022), or if symptoms worsen or fail to improve, for 4-6 weeks follow-up mood updates.   Saralyn Pilar, DO [facility] 09/17/2022, 10:10 AM

## 2022-09-18 DIAGNOSIS — E538 Deficiency of other specified B group vitamins: Secondary | ICD-10-CM | POA: Diagnosis not present

## 2022-09-18 DIAGNOSIS — R7303 Prediabetes: Secondary | ICD-10-CM | POA: Diagnosis not present

## 2022-09-18 DIAGNOSIS — E559 Vitamin D deficiency, unspecified: Secondary | ICD-10-CM | POA: Diagnosis not present

## 2022-09-18 DIAGNOSIS — D508 Other iron deficiency anemias: Secondary | ICD-10-CM | POA: Diagnosis not present

## 2022-09-18 DIAGNOSIS — E049 Nontoxic goiter, unspecified: Secondary | ICD-10-CM | POA: Diagnosis not present

## 2022-09-19 LAB — CBC WITH DIFFERENTIAL/PLATELET
Absolute Monocytes: 504 cells/uL (ref 200–950)
Basophils Absolute: 7 cells/uL (ref 0–200)
Basophils Relative: 0.1 %
Eosinophils Absolute: 7 cells/uL — ABNORMAL LOW (ref 15–500)
Eosinophils Relative: 0.1 %
HCT: 36.5 % (ref 35.0–45.0)
Hemoglobin: 11.6 g/dL — ABNORMAL LOW (ref 11.7–15.5)
Lymphs Abs: 2275 cells/uL (ref 850–3900)
MCH: 27.3 pg (ref 27.0–33.0)
MCHC: 31.8 g/dL — ABNORMAL LOW (ref 32.0–36.0)
MCV: 85.9 fL (ref 80.0–100.0)
MPV: 9.3 fL (ref 7.5–12.5)
Monocytes Relative: 7 %
Neutro Abs: 4406 cells/uL (ref 1500–7800)
Neutrophils Relative %: 61.2 %
Platelets: 254 10*3/uL (ref 140–400)
RBC: 4.25 10*6/uL (ref 3.80–5.10)
RDW: 13.3 % (ref 11.0–15.0)
Total Lymphocyte: 31.6 %
WBC: 7.2 10*3/uL (ref 3.8–10.8)

## 2022-09-19 LAB — COMPLETE METABOLIC PANEL WITH GFR
AG Ratio: 1.7 (calc) (ref 1.0–2.5)
ALT: 20 U/L (ref 6–29)
AST: 29 U/L (ref 10–35)
Albumin: 4 g/dL (ref 3.6–5.1)
Alkaline phosphatase (APISO): 100 U/L (ref 37–153)
BUN: 10 mg/dL (ref 7–25)
CO2: 29 mmol/L (ref 20–32)
Calcium: 8.5 mg/dL — ABNORMAL LOW (ref 8.6–10.4)
Chloride: 104 mmol/L (ref 98–110)
Creat: 0.74 mg/dL (ref 0.60–1.00)
Globulin: 2.4 g/dL (calc) (ref 1.9–3.7)
Glucose, Bld: 111 mg/dL — ABNORMAL HIGH (ref 65–99)
Potassium: 3.9 mmol/L (ref 3.5–5.3)
Sodium: 141 mmol/L (ref 135–146)
Total Bilirubin: 0.5 mg/dL (ref 0.2–1.2)
Total Protein: 6.4 g/dL (ref 6.1–8.1)
eGFR: 85 mL/min/{1.73_m2} (ref >=60)

## 2022-09-19 LAB — TSH: TSH: 1.87 mIU/L (ref 0.40–4.50)

## 2022-09-19 LAB — HEMOGLOBIN A1C
Hgb A1c MFr Bld: 6.8 % of total Hgb — ABNORMAL HIGH (ref <5.7)
Mean Plasma Glucose: 148 mg/dL
eAG (mmol/L): 8.2 mmol/L

## 2022-09-19 LAB — VITAMIN D 25 HYDROXY (VIT D DEFICIENCY, FRACTURES): Vit D, 25-Hydroxy: 30 ng/mL (ref 30–100)

## 2022-09-19 LAB — VITAMIN B12: Vitamin B-12: 819 pg/mL (ref 200–1100)

## 2022-09-19 LAB — IRON,TIBC AND FERRITIN PANEL
%SAT: 24 % (calc) (ref 16–45)
Ferritin: 31 ng/mL (ref 16–288)
Iron: 76 ug/dL (ref 45–160)
TIBC: 311 mcg/dL (calc) (ref 250–450)

## 2022-09-19 LAB — T4, FREE: Free T4: 1.1 ng/dL (ref 0.8–1.8)

## 2022-09-20 ENCOUNTER — Encounter: Payer: Self-pay | Admitting: Family Medicine

## 2022-09-20 DIAGNOSIS — E119 Type 2 diabetes mellitus without complications: Secondary | ICD-10-CM | POA: Insufficient documentation

## 2022-09-20 DIAGNOSIS — E1169 Type 2 diabetes mellitus with other specified complication: Secondary | ICD-10-CM

## 2022-09-20 MED ORDER — METFORMIN HCL 500 MG PO TABS
500.0000 mg | ORAL_TABLET | Freq: Two times a day (BID) | ORAL | 1 refills | Status: DC
Start: 2022-09-20 — End: 2022-12-13

## 2022-09-21 ENCOUNTER — Encounter: Payer: Self-pay | Admitting: Family Medicine

## 2022-09-21 DIAGNOSIS — D510 Vitamin B12 deficiency anemia due to intrinsic factor deficiency: Secondary | ICD-10-CM

## 2022-09-25 MED ORDER — MAGNESIUM OXIDE 400 MG PO TABS
400.0000 mg | ORAL_TABLET | Freq: Every day | ORAL | 3 refills | Status: DC
Start: 2022-09-25 — End: 2023-10-15

## 2022-09-25 NOTE — Addendum Note (Signed)
Addended by: Smitty Cords on: 09/25/2022 06:35 PM   Modules accepted: Orders

## 2022-10-11 ENCOUNTER — Encounter: Payer: Self-pay | Admitting: Internal Medicine

## 2022-10-12 ENCOUNTER — Telehealth: Payer: Self-pay

## 2022-10-12 ENCOUNTER — Encounter: Payer: Self-pay | Admitting: Internal Medicine

## 2022-10-12 ENCOUNTER — Telehealth (INDEPENDENT_AMBULATORY_CARE_PROVIDER_SITE_OTHER): Payer: Medicare PPO | Admitting: Internal Medicine

## 2022-10-12 DIAGNOSIS — G4733 Obstructive sleep apnea (adult) (pediatric): Secondary | ICD-10-CM | POA: Diagnosis not present

## 2022-10-12 DIAGNOSIS — J0110 Acute frontal sinusitis, unspecified: Secondary | ICD-10-CM

## 2022-10-12 MED ORDER — DOXYCYCLINE HYCLATE 100 MG PO TABS: 100.0000 mg | ORAL_TABLET | Freq: Two times a day (BID) | ORAL | 0 refills | Status: DC

## 2022-10-12 MED ORDER — DOXYCYCLINE HYCLATE 100 MG PO CAPS: 100.0000 mg | ORAL_CAPSULE | Freq: Two times a day (BID) | ORAL | 0 refills | Status: AC

## 2022-10-12 NOTE — Telephone Encounter (Signed)
Pt advised.   Thanks,   -Dailyn Kempner  

## 2022-10-12 NOTE — Patient Instructions (Signed)

## 2022-10-12 NOTE — Telephone Encounter (Signed)
Capsules sent in place of tablets

## 2022-10-12 NOTE — Progress Notes (Signed)
Virtual Visit via Video Note  I connected with Langley Adie on 10/12/22 at  2:20 PM EDT by a video enabled telemedicine application and verified that I am speaking with the correct person using two identifiers.  Location: Patient: Home Provider: Office  Person's participating in this video call: Nicki Reaper, NP-C and Sarita Haver  I discussed the limitations of evaluation and management by telemedicine and the availability of in person appointments. The patient expressed understanding and agreed to proceed.  History of Present Illness:  Pt reports headaches, dizziness, sinus pain and pressure, nasal congestion, cough and shortness of breath. This started 1 week ago. The headache is located in her forehead. She describes the pain and pressure.  The dizziness has resolved.  She is blowing colored mucus out of her nose.  The cough is also productive of colored mucus.  She denies runny nose, ear pain, sore throat, nausea, vomiting or diarrhea.  She denies fever, chills or body aches.  She has had exposure to strep throat.  She is taking cold and flu medicine OTC with minimal relief of symptoms.  She has a history of COPD, managed on breztri and albuterol.    Past Medical History:  Diagnosis Date   Allergy    Anxiety    B12 deficiency    Carpal tunnel syndrome    Centrilobular emphysema (HCC)    COPD (chronic obstructive pulmonary disease) (HCC)    Depression    Genetic testing    Common Cancers panel (47 genes) @ [facility] - No pathogenic mutations detected   HLD (hyperlipidemia)    Hyperlipidemia    Hypertension    Iron deficiency anemia 10/16/2016   Lipoma of skin    Lung nodule    Normocytic anemia 10/16/2016   QT prolongation    Substance abuse (HCC)     Current Outpatient Medications  Medication Sig Dispense Refill   albuterol (PROVENTIL) (2.5 MG/3ML) 0.083% nebulizer solution USE 1 VIAL IN NEBULIZER EVERY 6 HOURS AS NEEDED FOR WHEEZING AND FOR SHORTNESS OF BREATH 225 mL 0    albuterol (VENTOLIN HFA) 108 (90 Base) MCG/ACT inhaler Inhale 2 puffs into the lungs every 6 (six) hours as needed for wheezing or shortness of breath. 8 g 5   BREZTRI AEROSPHERE 160-9-4.8 MCG/ACT AERO Inhale 2 puffs into the lungs 2 (two) times daily. 10.7 g 11   cyanocobalamin (VITAMIN B12) 1000 MCG/ML injection INJECT 1 ML INTO THE MUSCLE EVERY 30 DAYS 3 mL 1   fluticasone (FLONASE) 50 MCG/ACT nasal spray Place 2 sprays into both nostrils daily.  1   furosemide (LASIX) 20 MG tablet Take 20 mg by mouth daily.  0   losartan (COZAAR) 50 MG tablet Take 1 tablet (50 mg total) by mouth daily. 90 tablet 1   magnesium oxide (MAG-OX) 400 MG tablet Take 1 tablet (400 mg total) by mouth daily. 90 tablet 3   metFORMIN (GLUCOPHAGE) 500 MG tablet Take 1 tablet (500 mg total) by mouth 2 (two) times daily with a meal. 180 tablet 1   Multiple Vitamin (MULTI-VITAMINS) TABS Take by mouth.     omeprazole (PRILOSEC) 20 MG capsule TAKE 1 CAPSULE BY MOUTH ONCE DAILY BEFORE BREAKFAST 90 capsule 1   potassium chloride SA (KLOR-CON M) 20 MEQ tablet Take 1 tablet (20 mEq total) by mouth daily. 90 tablet 1   REXULTI 1 MG TABS tablet Take 1 tablet (1 mg total) by mouth daily. 30 tablet 0   simvastatin (ZOCOR) 80 MG tablet Take 1  tablet (80 mg total) by mouth at bedtime. 90 tablet 3   venlafaxine XR (EFFEXOR-XR) 75 MG 24 hr capsule TAKE 3 CAPSULES BY MOUTH ONCE DAILY WITH BREAKFAST 270 capsule 1   No current facility-administered medications for this visit.    Allergies  Allergen Reactions   Moxifloxacin Itching, Other (See Comments), Photosensitivity, Rash and Swelling    Causes swelling, redness, burning in eyes    Levofloxacin Other (See Comments)    Other reaction(s): Muscle Pain Other reaction(s): Muscle Pain   Amoxicillin-Pot Clavulanate Nausea Only and Other (See Comments)    Other Reaction: DIARRHEA. Other reaction(s): Other (See Comments) Other Reaction: DIARRHEA. Other Reaction: DIARRHEA.  Other  reaction(s): Other (See Comments) Other Reaction: DIARRHEA.    Family History  Problem Relation Age of Onset   Ovarian cancer Mother 19       deceased 63   Breast cancer Mother 92   Stroke Father    Lung cancer Paternal Aunt    Lung cancer Cousin     Social History   Socioeconomic History   Marital status: Divorced    Spouse name: Not on file   Number of children: 1   Years of education: Not on file   Highest education level: Associate degree: academic program  Occupational History   Occupation: Retired  Tobacco Use   Smoking status: Every Day    Current packs/day: 0.50    Average packs/day: 0.5 packs/day for 52.0 years (26.0 ttl pk-yrs)    Types: Cigarettes   Smokeless tobacco: Never   Tobacco comments:    5 ciggarette a day--03/02/2020  Vaping Use   Vaping status: Some Days  Substance and Sexual Activity   Alcohol use: No   Drug use: No   Sexual activity: Not Currently  Other Topics Concern   Not on file  Social History Narrative   Not on file   Social Determinants of Health   Financial Resource Strain: Low Risk  (09/13/2022)   Overall Financial Resource Strain (CARDIA)    Difficulty of Paying Living Expenses: Not hard at all  Food Insecurity: No Food Insecurity (09/13/2022)   Hunger Vital Sign    Worried About Running Out of Food in the Last Year: Never true    Ran Out of Food in the Last Year: Never true  Transportation Needs: No Transportation Needs (09/13/2022)   PRAPARE - Administrator, Civil Service (Medical): No    Lack of Transportation (Non-Medical): No  Physical Activity: Inactive (09/13/2022)   Exercise Vital Sign    Days of Exercise per Week: 0 days    Minutes of Exercise per Session: 0 min  Stress: No Stress Concern Present (09/13/2022)   [employer] of Occupational Health - Occupational Stress Questionnaire    Feeling of Stress : Only a little  Social Connections: Socially Isolated (09/13/2022)   Social Connection and  Isolation Panel [NHANES]    Frequency of Communication with Friends and Family: Twice a week    Frequency of Social Gatherings with Friends and Family: More than three times a week    Attends Religious Services: Never    Database administrator or Organizations: No    Attends Banker Meetings: Never    Marital Status: Divorced  Catering manager Violence: Not At Risk (09/13/2022)   Humiliation, Afraid, Rape, and Kick questionnaire    Fear of Current or Ex-Partner: No    Emotionally Abused: No    Physically Abused: No    Sexually  Abused: No     Constitutional: Patient reports headache.  Denies fever, malaise, fatigue, or abrupt weight changes.  HEENT: Patient reports sinus pressure, nasal congestion.  Denies eye pain, eye redness, ear pain, ringing in the ears, wax buildup, runny nose, bloody nose, or sore throat. Respiratory: Patient reports cough and shortness of breath.  Denies difficulty breathing.   Cardiovascular: Denies chest pain, chest tightness, palpitations or swelling in the hands or feet.  Gastrointestinal: Denies abdominal pain, bloating, constipation, diarrhea or blood in the stool.   No other specific complaints in a complete review of systems (except as listed in HPI above).  Observations/Objective:  There were no vitals taken for this visit. Wt Readings from Last 3 Encounters:  09/17/22 191 lb (86.6 kg)  09/13/22 187 lb (84.8 kg)  04/23/22 187 lb 14.4 oz (85.2 kg)    General: Appears her stated age, appears unwell but in NAD. HEENT: Head: normal shape and size, maxillary sinus pressure noted; Nose: Congestion noted; Throat/Mouth: Hoarseness noted. Pulmonary/Chest: Normal effort with dry hacking cough noted. No respiratory distress.  Neurological: Alert and oriented.   BMET    Component Value Date/Time   NA 141 09/18/2022 0852   NA 140 05/06/2014 1028   K 3.9 09/18/2022 0852   K 3.5 05/06/2014 1028   CL 104 09/18/2022 0852   CL 105 05/06/2014  1028   CO2 29 09/18/2022 0852   CO2 27 05/06/2014 1028   GLUCOSE 111 (H) 09/18/2022 0852   GLUCOSE 101 (H) 05/06/2014 1028   BUN 10 09/18/2022 0852   BUN 6 (L) 05/06/2014 1028   CREATININE 0.74 09/18/2022 0852   CALCIUM 8.5 (L) 09/18/2022 0852   CALCIUM 8.8 05/06/2014 1028   GFRNONAA >60 10/18/2020 0852   GFRNONAA >60 05/06/2014 1028   GFRAA >60 10/20/2019 1117   GFRAA >60 05/06/2014 1028    Lipid Panel     Component Value Date/Time   CHOL 181 09/28/2021 0801   TRIG 171 (H) 09/28/2021 0801   HDL 55 09/28/2021 0801   CHOLHDL 3.3 09/28/2021 0801   LDLCALC 99 09/28/2021 0801    CBC    Component Value Date/Time   WBC 7.2 09/18/2022 0852   RBC 4.25 09/18/2022 0852   HGB 11.6 (L) 09/18/2022 0852   HGB 13.2 05/06/2014 1028   HCT 36.5 09/18/2022 0852   HCT 40.0 05/06/2014 1028   PLT 254 09/18/2022 0852   PLT 331 05/06/2014 1028   MCV 85.9 09/18/2022 0852   MCV 84 05/06/2014 1028   MCH 27.3 09/18/2022 0852   MCHC 31.8 (L) 09/18/2022 0852   RDW 13.3 09/18/2022 0852   RDW 13.4 05/06/2014 1028   LYMPHSABS 2,275 09/18/2022 0852   MONOABS 0.7 10/18/2020 0852   EOSABS 7 (L) 09/18/2022 0852   BASOSABS 7 09/18/2022 0852    Hgb A1C Lab Results  Component Value Date   HGBA1C 6.8 (H) 09/18/2022       Assessment and Plan: Acute frontal sinusitis:  Encourage rest and fluids Can use a neti pot which can be purchased from her local pharmacy Rx for doxycycline 100 mg twice daily x 10 days Recommends Zyrtec and Flonase OTC for symptom management  Follow-up with your PCP as previously scheduled Follow Up Instructions:    I discussed the assessment and treatment plan with the patient. The patient was provided an opportunity to ask questions and all were answered. The patient agreed with the plan and demonstrated an understanding of the instructions.   The patient  was advised to call back or seek an in-person evaluation if the symptoms worsen or if the condition fails to  improve as anticipated.   Nicki Reaper, NP

## 2022-10-12 NOTE — Telephone Encounter (Signed)
See telephone encounter   Thanks,   -Gaspar Fowle  

## 2022-10-12 NOTE — Telephone Encounter (Signed)
Copied from CRM [id number]. Topic: General - Other >> Oct 12, 2022 11:34 AM Franchot Heidelberg wrote: Reason for CRM: Heather from [facility] called to report that the patient is unable to swallow tablets and needs capsules instead for her doxycycline (VIBRA-TABS) 100 MG tablet  Ask for [facility]

## 2022-10-18 ENCOUNTER — Ambulatory Visit: Payer: Medicare PPO | Admitting: Family Medicine

## 2022-10-27 ENCOUNTER — Emergency Department: Payer: Medicare PPO

## 2022-10-27 ENCOUNTER — Emergency Department
Admission: EM | Admit: 2022-10-27 | Discharge: 2022-10-27 | Disposition: A | Discharge status: Home / Self Care | Payer: Medicare PPO | Attending: Emergency Medicine | Admitting: Emergency Medicine

## 2022-10-27 ENCOUNTER — Other Ambulatory Visit: Payer: Self-pay

## 2022-10-27 DIAGNOSIS — R2681 Unsteadiness on feet: Secondary | ICD-10-CM

## 2022-10-27 DIAGNOSIS — R531 Weakness: Secondary | ICD-10-CM | POA: Insufficient documentation

## 2022-10-27 DIAGNOSIS — R269 Unspecified abnormalities of gait and mobility: Secondary | ICD-10-CM | POA: Insufficient documentation

## 2022-10-27 DIAGNOSIS — I672 Cerebral atherosclerosis: Secondary | ICD-10-CM | POA: Diagnosis not present

## 2022-10-27 DIAGNOSIS — J449 Chronic obstructive pulmonary disease, unspecified: Secondary | ICD-10-CM | POA: Diagnosis not present

## 2022-10-27 DIAGNOSIS — I700 Atherosclerosis of aorta: Secondary | ICD-10-CM | POA: Diagnosis not present

## 2022-10-27 DIAGNOSIS — R42 Dizziness and giddiness: Secondary | ICD-10-CM | POA: Diagnosis not present

## 2022-10-27 DIAGNOSIS — I10 Essential (primary) hypertension: Secondary | ICD-10-CM | POA: Diagnosis not present

## 2022-10-27 DIAGNOSIS — I6782 Cerebral ischemia: Secondary | ICD-10-CM | POA: Diagnosis not present

## 2022-10-27 DIAGNOSIS — I6523 Occlusion and stenosis of bilateral carotid arteries: Secondary | ICD-10-CM | POA: Diagnosis not present

## 2022-10-27 LAB — CBC
HCT: 38.7 % (ref 36.0–46.0)
Hemoglobin: 12.9 g/dL (ref 12.0–15.0)
MCH: 27.8 pg (ref 26.0–34.0)
MCHC: 33.3 g/dL (ref 30.0–36.0)
MCV: 83.4 fL (ref 80.0–100.0)
Platelets: 288 10*3/uL (ref 150–400)
RBC: 4.64 MIL/uL (ref 3.87–5.11)
RDW: 13.3 % (ref 11.5–15.5)
WBC: 10.4 10*3/uL (ref 4.0–10.5)
nRBC: 0 % (ref 0.0–0.2)

## 2022-10-27 LAB — URINALYSIS, ROUTINE W REFLEX MICROSCOPIC
Bilirubin Urine: NEGATIVE
Glucose, UA: NEGATIVE mg/dL
Hgb urine dipstick: NEGATIVE
Ketones, ur: NEGATIVE mg/dL
Leukocytes,Ua: NEGATIVE
Nitrite: NEGATIVE
Protein, ur: NEGATIVE mg/dL
Specific Gravity, Urine: 1.003 — ABNORMAL LOW (ref 1.005–1.030)
pH: 7 (ref 5.0–8.0)

## 2022-10-27 LAB — BASIC METABOLIC PANEL
Anion gap: 10 (ref 5–15)
BUN: 10 mg/dL (ref 8–23)
CO2: 26 mmol/L (ref 22–32)
Calcium: 8.6 mg/dL — ABNORMAL LOW (ref 8.9–10.3)
Chloride: 101 mmol/L (ref 98–111)
Creatinine, Ser: 0.83 mg/dL (ref 0.44–1.00)
GFR, Estimated: >60 mL/min (ref >60)
Glucose, Bld: 108 mg/dL — ABNORMAL HIGH (ref 70–99)
Potassium: 3.6 mmol/L (ref 3.5–5.1)
Sodium: 137 mmol/L (ref 135–145)

## 2022-10-27 LAB — CBG MONITORING, ED: Glucose-Capillary: 132 mg/dL — ABNORMAL HIGH (ref 70–99)

## 2022-10-27 MED ORDER — IOHEXOL 350 MG/ML SOLN
75.0000 mL | Freq: Once | INTRAVENOUS | Status: AC | PRN
  Administered 2022-10-27: 75 mL via INTRAVENOUS

## 2022-10-27 NOTE — ED Provider Notes (Signed)
[facility] Provider Note    Event Date/Time   First MD Initiated Contact with Patient 10/27/22 1227     (approximate)   History   Weakness   HPI  Elizabeth [name] is a 72 y.o. female past medical history significant for prediabetes, iron deficiency anemia, COPD, who presents to the emergency department with difficulty with ambulation.  Patient states that she has been having progressively worsening weakness over the past couple of months.  This morning felt like it got worse and she was having difficulty with walking.  States that she has been evaluated by her primary care physician for this recently.  She was started on metformin and states that she stopped taking this medication secondary to nausea and vomiting.  States that she has not notified her primary care physician about this change.  Denies any headache or change in vision.  Denies any extremity numbness or weakness.  Does not normally ambulate with a walker or any other assistance.  Denies any falls or trauma.  Denies any significant alcohol use.  Denies dysuria.  No chest pain or shortness of breath that is new.  States that she has constant shortness of breath from her COPD.  Does not feel that it is worse than her normal.     Physical Exam   Triage Vital Signs: ED Triage Vitals  Encounter Vitals Group     BP 10/27/22 1247 (!) 174/95     Systolic BP Percentile --      Diastolic BP Percentile --      Pulse Rate 10/27/22 1247 96     Resp 10/27/22 1247 19     Temp 10/27/22 1247 98.4 F (36.9 C)     Temp Source 10/27/22 1247 Oral     SpO2 10/27/22 1247 95 %     Weight 10/27/22 1208 188 lb (85.3 kg)     Height 10/27/22 1208 5\' 2"  (1.575 m)     Head Circumference --      Peak Flow --      Pain Score 10/27/22 1208 0     Pain Loc --      Pain Education --      Exclude from Growth Chart --     Most recent vital signs: Vitals:   10/27/22 1247  BP: (!) 174/95  Pulse: 96  Resp: 19  Temp: 98.4  F (36.9 C)  SpO2: 95%    Physical Exam Constitutional:      Appearance: She is well-developed.  HENT:     Head: Atraumatic.  Eyes:     Extraocular Movements: Extraocular movements intact.     Conjunctiva/sclera: Conjunctivae normal.     Pupils: Pupils are equal, round, and reactive to light.     Comments: No nystagmus  Cardiovascular:     Rate and Rhythm: Regular rhythm.  Pulmonary:     Effort: Respiratory distress present.     Comments: Patient speaking in 3 word sentences.  States that this is normal for her COPD with no worsening from her baseline. Abdominal:     General: There is no distension.     Tenderness: There is no abdominal tenderness.  Musculoskeletal:        General: Normal range of motion.     Cervical back: Normal range of motion.  Skin:    General: Skin is warm.  Neurological:     Mental Status: She is alert. Mental status is at baseline.     GCS: GCS eye  subscore is 4. GCS verbal subscore is 5. GCS motor subscore is 6.     Cranial Nerves: Cranial nerves 2-12 are intact.     Sensory: Sensation is intact.     Motor: Motor function is intact.     Coordination: Coordination is intact.     Gait: Gait abnormal.     IMPRESSION / MDM / ASSESSMENT AND PLAN / ED COURSE  I reviewed the triage vital signs and the nursing notes.  Differential diagnosis including CVA, vertebrobasilar dissection, intracranial hemorrhage, dehydration, electrolyte abnormality, urinary tract infection, dehydration, hyperglycemia  EKG  I, Corena Herter, the attending physician, personally viewed and interpreted this ECG.   Rate: Normal  Rhythm: Normal sinus  Axis: Normal  Intervals: Normal  ST&T Change: None  No tachycardic or bradycardic dysrhythmias while on cardiac telemetry.  RADIOLOGY I independently reviewed imaging, my interpretation of imaging: MRI of the brain with no acute findings.  CTA with no signs of dissection.  Signs of large vessel cutoff.  LABS (all labs  ordered are listed, but only abnormal results are displayed) Labs interpreted as -    Labs Reviewed  BASIC METABOLIC PANEL - Abnormal; Notable for the following components:      Result Value   Glucose, Bld 108 (*)    Calcium 8.6 (*)    All other components within normal limits  URINALYSIS, ROUTINE W REFLEX MICROSCOPIC - Abnormal; Notable for the following components:   Color, Urine STRAW (*)    APPearance HAZY (*)    Specific Gravity, Urine 1.003 (*)    All other components within normal limits  CBC  CBG MONITORING, ED     MDM  Glucose within normal limits.  No signs of urinary tract infection.  Creatinine at baseline with no significant electrolyte abnormalities.  No chest pain, have low suspicion for ACS or PE.  MRI of the brain was negative for acute CVA.  No signs of dissection or large vessel cutoff on CTA.  Patient symptoms have been ongoing, discussed close follow-up with primary care provider.  Patient was able to ambulate in the emergency department back and forth to the bathroom multiple times without any difficulties.  Discussed follow-up and return precautions.     PROCEDURES:  Critical Care performed: No  Procedures  Patient's presentation is most consistent with acute presentation with potential threat to life or bodily function.   MEDICATIONS ORDERED IN ED: Medications  iohexol (OMNIPAQUE) 350 MG/ML injection 75 mL (75 mLs Intravenous Contrast Given 10/27/22 1518)    FINAL CLINICAL IMPRESSION(S) / ED DIAGNOSES   Final diagnoses:  Gait instability     Rx / DC Orders   ED Discharge Orders     None        Note:  This document was prepared using Dragon voice recognition software and may include unintentional dictation errors.   Corena Herter, MD 10/27/22 [phone]

## 2022-10-27 NOTE — Discharge Instructions (Addendum)
You were seen in the emergency department for difficulty with walking.  You had an MRI of your brain that did not show any signs of a stroke.  You had a CT scan with contrast of your head and neck that did not show any vessels that were blocked or any signs of tears in your vessels.  Your lab work was overall normal.  You have no findings of urinary tract infection.  It is importantly follow-up closely with your primary care physician.  Return to the emergency department for any worsening symptoms.  Discussed with your primary care doctor whether he may need a referral for PT and OT.

## 2022-10-27 NOTE — ED Notes (Signed)
Pt to MRI

## 2022-10-27 NOTE — ED Triage Notes (Addendum)
Pt reports feels weak and has for the last few months. Pt reports it has gotten worse over the last few days. Pt reports her blood is low per her MD and she was told to make a follow up with Dr. Ronald Lobo but she has not. Last labs were completed in June of 2024. Denies pain. Pt with labored breathing, reports this is normal for her COPD.

## 2022-11-04 ENCOUNTER — Other Ambulatory Visit: Payer: Self-pay | Admitting: Family Medicine

## 2022-11-04 DIAGNOSIS — E782 Mixed hyperlipidemia: Secondary | ICD-10-CM

## 2022-11-05 ENCOUNTER — Telehealth: Payer: Self-pay

## 2022-11-05 NOTE — Telephone Encounter (Signed)
Transition Care Management Follow-up Telephone Call Date of discharge and from where: [facility] 7/27 How have you been since you were released from the hospital? Doing well Any questions or concerns? No  Items Reviewed: Did the pt receive and understand the discharge instructions provided? Yes  Medications obtained and verified? Yes  Other? No  Any new allergies since your discharge? No  Dietary orders reviewed? No Do you have support at home? Yes     Follow up appointments reviewed:  PCP Hospital f/u appt confirmed? No  Scheduled to see  on  @ . Specialist Hospital f/u appt confirmed? No  Scheduled to see  on  @ . Are transportation arrangements needed? No  If their condition worsens, is the pt aware to call PCP or go to the Emergency Dept.? Yes Was the patient provided with contact information for the PCP's office or ED? Yes Was to pt encouraged to call back with questions or concerns? Yes

## 2022-11-05 NOTE — Telephone Encounter (Signed)
Requested Prescriptions  Pending Prescriptions Disp Refills   simvastatin (ZOCOR) 80 MG tablet [Pharmacy Med Name: Simvastatin 80 MG Oral Tablet] 90 tablet 0    Sig: TAKE 1 TABLET BY MOUTH AT BEDTIME     Cardiovascular:  Antilipid - Statins Failed - 11/04/2022  6:20 AM      Failed - Lipid Panel in normal range within the last 12 months    Cholesterol  Date Value Ref Range Status  09/28/2021 181 <200 mg/dL Final   LDL Cholesterol (Calc)  Date Value Ref Range Status  09/28/2021 99 mg/dL (calc) Final    Comment:    Reference range: <100 . Desirable range <100 mg/dL for primary prevention;   <70 mg/dL for patients with CHD or diabetic patients  with > or = 2 CHD risk factors. Marland Kitchen LDL-C is now calculated using the Martin-Hopkins  calculation, which is a validated novel method providing  better accuracy than the Friedewald equation in the  estimation of LDL-C.  Horald Pollen et al. Lenox Ahr. 4742;595(63): 2061-2068  (http://education.[facility].com/faq/FAQ164)    HDL  Date Value Ref Range Status  09/28/2021 55 > OR = 50 mg/dL Final   Triglycerides  Date Value Ref Range Status  09/28/2021 171 (H) <150 mg/dL Final         Passed - Patient is not pregnant      Passed - Valid encounter within last 12 months    Recent Outpatient Visits           3 weeks ago Acute non-recurrent frontal sinusitis   [place] [place], Salvadore Oxford, NP   1 month ago Other iron deficiency anemia   [place] [facility] Smitty Cords, DO   10 months ago Other iron deficiency anemia   [place] [facility] Smitty Cords, DO   11 months ago SOB (shortness of breath)   [place] [facility] [place], Oswaldo Conroy, [place]   1 year ago Annual physical exam   [place] [facility] [place], Netta Neat, [place]

## 2022-11-06 ENCOUNTER — Other Ambulatory Visit: Payer: Self-pay | Admitting: Family Medicine

## 2022-11-06 DIAGNOSIS — E782 Mixed hyperlipidemia: Secondary | ICD-10-CM

## 2022-11-08 ENCOUNTER — Other Ambulatory Visit: Payer: Self-pay

## 2022-11-08 DIAGNOSIS — E782 Mixed hyperlipidemia: Secondary | ICD-10-CM

## 2022-11-08 MED ORDER — SIMVASTATIN 80 MG PO TABS
80.0000 mg | ORAL_TABLET | Freq: Every day | ORAL | 0 refills | Status: DC
Start: 2022-11-08 — End: 2023-02-05

## 2022-11-08 NOTE — Telephone Encounter (Signed)
Requested Prescriptions  Refused Prescriptions Disp Refills   simvastatin (ZOCOR) 80 MG tablet [Pharmacy Med Name: Simvastatin 80 MG Oral Tablet] 90 tablet 0    Sig: TAKE 1 TABLET BY MOUTH AT BEDTIME     Cardiovascular:  Antilipid - Statins Failed - 11/06/2022  5:49 PM      Failed - Lipid Panel in normal range within the last 12 months    Cholesterol  Date Value Ref Range Status  09/28/2021 181 <200 mg/dL Final   LDL Cholesterol (Calc)  Date Value Ref Range Status  09/28/2021 99 mg/dL (calc) Final    Comment:    Reference range: <100 . Desirable range <100 mg/dL for primary prevention;   <70 mg/dL for patients with CHD or diabetic patients  with > or = 2 CHD risk factors. Marland Kitchen LDL-C is now calculated using the Martin-Hopkins  calculation, which is a validated novel method providing  better accuracy than the Friedewald equation in the  estimation of LDL-C.  Horald Pollen et al. Lenox Ahr. 4098;119(14): 2061-2068  (http://education.[facility].com/faq/FAQ164)    HDL  Date Value Ref Range Status  09/28/2021 55 > OR = 50 mg/dL Final   Triglycerides  Date Value Ref Range Status  09/28/2021 171 (H) <150 mg/dL Final         Passed - Patient is not pregnant      Passed - Valid encounter within last 12 months    Recent Outpatient Visits           3 weeks ago Acute non-recurrent frontal sinusitis   [facility] [place], Salvadore Oxford, NP   1 month ago Other iron deficiency anemia   [facility] Smitty Cords, DO   10 months ago Other iron deficiency anemia   [place] [facility] Smitty Cords, DO   11 months ago SOB (shortness of breath)   [place] [facility] [place], Oswaldo Conroy, [place]   1 year ago Annual physical exam   [place] [facility] [place], Netta Neat, [place]

## 2022-11-14 ENCOUNTER — Inpatient Hospital Stay: Payer: Medicare PPO | Admitting: Oncology

## 2022-11-19 ENCOUNTER — Other Ambulatory Visit: Payer: Self-pay | Admitting: Family Medicine

## 2022-11-19 DIAGNOSIS — Z1231 Encounter for screening mammogram for malignant neoplasm of breast: Secondary | ICD-10-CM

## 2022-11-27 DIAGNOSIS — Z9981 Dependence on supplemental oxygen: Secondary | ICD-10-CM | POA: Diagnosis not present

## 2022-11-27 DIAGNOSIS — I4581 Long QT syndrome: Secondary | ICD-10-CM | POA: Diagnosis not present

## 2022-11-27 DIAGNOSIS — Z79899 Other long term (current) drug therapy: Secondary | ICD-10-CM | POA: Diagnosis not present

## 2022-11-27 DIAGNOSIS — R634 Abnormal weight loss: Secondary | ICD-10-CM | POA: Diagnosis not present

## 2022-11-27 DIAGNOSIS — F17210 Nicotine dependence, cigarettes, uncomplicated: Secondary | ICD-10-CM | POA: Diagnosis not present

## 2022-11-27 DIAGNOSIS — I2510 Atherosclerotic heart disease of native coronary artery without angina pectoris: Secondary | ICD-10-CM | POA: Diagnosis not present

## 2022-11-27 DIAGNOSIS — J449 Chronic obstructive pulmonary disease, unspecified: Secondary | ICD-10-CM | POA: Diagnosis not present

## 2022-11-27 DIAGNOSIS — Z7951 Long term (current) use of inhaled steroids: Secondary | ICD-10-CM | POA: Diagnosis not present

## 2022-11-27 DIAGNOSIS — E042 Nontoxic multinodular goiter: Secondary | ICD-10-CM | POA: Diagnosis not present

## 2022-11-30 ENCOUNTER — Telehealth: Payer: Medicare PPO | Admitting: Family Medicine

## 2022-11-30 NOTE — Progress Notes (Signed)
Pt did not show for visit. DWB 

## 2022-12-13 ENCOUNTER — Inpatient Hospital Stay: Payer: Medicare PPO | Attending: Oncology | Admitting: Oncology

## 2022-12-13 ENCOUNTER — Inpatient Hospital Stay: Payer: Medicare PPO

## 2022-12-13 ENCOUNTER — Encounter: Payer: Self-pay | Admitting: Oncology

## 2022-12-13 VITALS — BP 146/74 | HR 103 | Temp 98.2°F | Resp 20 | Ht 62.0 in | Wt 190.0 lb

## 2022-12-13 DIAGNOSIS — Z08 Encounter for follow-up examination after completed treatment for malignant neoplasm: Secondary | ICD-10-CM | POA: Diagnosis not present

## 2022-12-13 DIAGNOSIS — Z85118 Personal history of other malignant neoplasm of bronchus and lung: Secondary | ICD-10-CM | POA: Insufficient documentation

## 2022-12-13 DIAGNOSIS — Z862 Personal history of diseases of the blood and blood-forming organs and certain disorders involving the immune mechanism: Secondary | ICD-10-CM | POA: Diagnosis not present

## 2022-12-13 DIAGNOSIS — Z923 Personal history of irradiation: Secondary | ICD-10-CM | POA: Insufficient documentation

## 2022-12-13 DIAGNOSIS — C3490 Malignant neoplasm of unspecified part of unspecified bronchus or lung: Secondary | ICD-10-CM

## 2022-12-13 DIAGNOSIS — R5383 Other fatigue: Secondary | ICD-10-CM | POA: Insufficient documentation

## 2022-12-13 LAB — CBC WITH DIFFERENTIAL/PLATELET
Abs Immature Granulocytes: 0.09 10*3/uL — ABNORMAL HIGH (ref 0.00–0.07)
Basophils Absolute: 0 10*3/uL (ref 0.0–0.1)
Basophils Relative: 0 %
Eosinophils Absolute: 0.1 10*3/uL (ref 0.0–0.5)
Eosinophils Relative: 1 %
HCT: 41.1 % (ref 36.0–46.0)
Hemoglobin: 13 g/dL (ref 12.0–15.0)
Immature Granulocytes: 1 %
Lymphocytes Relative: 27 %
Lymphs Abs: 2.5 10*3/uL (ref 0.7–4.0)
MCH: 27.4 pg (ref 26.0–34.0)
MCHC: 31.6 g/dL (ref 30.0–36.0)
MCV: 86.7 fL (ref 80.0–100.0)
Monocytes Absolute: 0.8 10*3/uL (ref 0.1–1.0)
Monocytes Relative: 8 %
Neutro Abs: 5.7 10*3/uL (ref 1.7–7.7)
Neutrophils Relative %: 63 %
Platelets: 287 10*3/uL (ref 150–400)
RBC: 4.74 MIL/uL (ref 3.87–5.11)
RDW: 13.8 % (ref 11.5–15.5)
WBC: 9 10*3/uL (ref 4.0–10.5)
nRBC: 0 % (ref 0.0–0.2)

## 2022-12-13 LAB — IRON AND TIBC
Iron: 119 ug/dL (ref 28–170)
Saturation Ratios: 34 % — ABNORMAL HIGH (ref 10.4–31.8)
TIBC: 353 ug/dL (ref 250–450)
UIBC: 234 ug/dL

## 2022-12-13 LAB — FOLATE: Folate: 21.4 ng/mL (ref >5.9)

## 2022-12-13 LAB — FERRITIN: Ferritin: 66 ng/mL (ref 11–307)

## 2022-12-13 LAB — VITAMIN B12: Vitamin B-12: 580 pg/mL (ref 180–914)

## 2022-12-13 NOTE — Progress Notes (Signed)
Having more weakness, exhausted, dizzy, light headed, terrible cramps in rib cage and very SOB. Thinks her ferritin is low because she had the same symptoms last time. She is on oxygen but only at night.

## 2022-12-13 NOTE — Progress Notes (Signed)
[facility]  Telephone:(336) [phone] Fax:(336) [phone]  ID: Elizabeth [name] OB: 1950-04-26  MR#: [id number]  AOZ#:[id number]  Patient Care Team: Smitty Cords, DO as PCP - General (Family Medicine) Glory Buff, RN as Oncology Nurse Navigator Carmina Miller, MD as Radiation Oncologist (Radiation Oncology)  CHIEF COMPLAINT: Probable stage Ia left lower lobe lung cancer, history of iron deficiency anemia.  INTERVAL HISTORY: Patient returns to clinic today as an add-on with complaints of increasing weakness and fatigue as well as shortness of breath. She has no neurologic complaints.  She denies any recent fevers or illnesses.  She has a good appetite and denies weight loss.  She has no chest pain, cough, or hemoptysis.  She denies any nausea, vomiting, constipation, or diarrhea.  She has no urinary complaints.  Patient offers no further specific complaints today.  REVIEW OF SYSTEMS:   Review of Systems  Constitutional:  Positive for malaise/fatigue. Negative for fever and weight loss.  Respiratory:  Positive for shortness of breath. Negative for cough and hemoptysis.   Cardiovascular: Negative.  Negative for chest pain and leg swelling.  Gastrointestinal: Negative.  Negative for abdominal pain.  Genitourinary: Negative.  Negative for dysuria.  Musculoskeletal: Negative.  Negative for back pain.  Skin: Negative.  Negative for rash.  Neurological:  Positive for weakness. Negative for dizziness, focal weakness and headaches.  Psychiatric/Behavioral: Negative.  The patient is not nervous/anxious.     As per HPI. Otherwise, a complete review of systems is negative.  PAST MEDICAL HISTORY: Past Medical History:  Diagnosis Date   Allergy    Anxiety    B12 deficiency    Carpal tunnel syndrome    Centrilobular emphysema (HCC)    COPD (chronic obstructive pulmonary disease) (HCC)    Depression    Genetic testing    Common Cancers panel (47 genes) @ [facility]  - No pathogenic mutations detected   HLD (hyperlipidemia)    Hyperlipidemia    Hypertension    Iron deficiency anemia 10/16/2016   Lipoma of skin    Lung nodule    Normocytic anemia 10/16/2016   QT prolongation    Substance abuse (HCC)     PAST SURGICAL HISTORY: Past Surgical History:  Procedure Laterality Date   ABDOMINAL HYSTERECTOMY     BREAST EXCISIONAL BIOPSY Left    1980's neg   CESAREAN SECTION     TUBAL LIGATION      FAMILY HISTORY: Family History  Problem Relation Age of Onset   Ovarian cancer Mother 21       deceased 29   Breast cancer Mother 34   Stroke Father    Lung cancer Paternal Aunt    Lung cancer Cousin     ADVANCED DIRECTIVES (Y/N):  N  HEALTH MAINTENANCE: Social History   Tobacco Use   Smoking status: Every Day    Current packs/day: 0.50    Average packs/day: 0.5 packs/day for 52.0 years (26.0 ttl pk-yrs)    Types: Cigarettes   Smokeless tobacco: Never   Tobacco comments:    5 ciggarette a day--03/02/2020  Vaping Use   Vaping status: Some Days  Substance Use Topics   Alcohol use: No   Drug use: No     Colonoscopy:  PAP:  Bone density:  Lipid panel:  Allergies  Allergen Reactions   Moxifloxacin Itching, Other (See Comments), Photosensitivity, Rash and Swelling    Causes swelling, redness, burning in eyes    Levofloxacin Other (See Comments)    Other reaction(s):  Muscle Pain Other reaction(s): Muscle Pain   Amoxicillin-Pot Clavulanate Nausea Only and Other (See Comments)    Other Reaction: DIARRHEA. Other reaction(s): Other (See Comments) Other Reaction: DIARRHEA. Other Reaction: DIARRHEA.  Other reaction(s): Other (See Comments) Other Reaction: DIARRHEA.    Current Outpatient Medications  Medication Sig Dispense Refill   albuterol (VENTOLIN HFA) 108 (90 Base) MCG/ACT inhaler Inhale 2 puffs into the lungs every 6 (six) hours as needed for wheezing or shortness of breath. 8 g 5   cyanocobalamin (VITAMIN B12) 1000 MCG/ML  injection INJECT 1 ML INTO THE MUSCLE EVERY 30 DAYS 3 mL 1   fluticasone (FLONASE) 50 MCG/ACT nasal spray Place 2 sprays into both nostrils daily.  1   furosemide (LASIX) 20 MG tablet Take 20 mg by mouth daily.  0   losartan (COZAAR) 50 MG tablet Take 1 tablet (50 mg total) by mouth daily. 90 tablet 1   magnesium oxide (MAG-OX) 400 MG tablet Take 1 tablet (400 mg total) by mouth daily. 90 tablet 3   Multiple Vitamin (MULTI-VITAMINS) TABS Take by mouth.     omeprazole (PRILOSEC) 20 MG capsule TAKE 1 CAPSULE BY MOUTH ONCE DAILY BEFORE BREAKFAST 90 capsule 1   potassium chloride SA (KLOR-CON M) 20 MEQ tablet Take 1 tablet (20 mEq total) by mouth daily. 90 tablet 1   simvastatin (ZOCOR) 80 MG tablet Take 1 tablet (80 mg total) by mouth at bedtime. 90 tablet 0   venlafaxine XR (EFFEXOR-XR) 75 MG 24 hr capsule TAKE 3 CAPSULES BY MOUTH ONCE DAILY WITH BREAKFAST 270 capsule 1   No current facility-administered medications for this visit.    OBJECTIVE: Vitals:   12/13/22 0942  BP: (!) 146/74  Pulse: (!) 103  Resp: 20  Temp: 98.2 F (36.8 C)  SpO2: 100%      Body mass index is 34.75 kg/m.    ECOG FS:1 - Symptomatic but completely ambulatory  General: Well-developed, well-nourished, no acute distress. Eyes: Pink conjunctiva, anicteric sclera. HEENT: Normocephalic, moist mucous membranes. Lungs: No audible wheezing or coughing. Heart: Regular rate and rhythm. Abdomen: Soft, nontender, no obvious distention. Musculoskeletal: No edema, cyanosis, or clubbing. Neuro: Alert, answering all questions appropriately. Cranial nerves grossly intact. Skin: No rashes or petechiae noted. Psych: Normal affect.  LAB RESULTS:  Lab Results  Component Value Date   NA 137 10/27/2022   K 3.6 10/27/2022   CL 101 10/27/2022   CO2 26 10/27/2022   GLUCOSE 108 (H) 10/27/2022   BUN 10 10/27/2022   CREATININE 0.83 10/27/2022   CALCIUM 8.6 (L) 10/27/2022   PROT 6.4 09/18/2022   ALBUMIN 3.8 10/18/2020    AST 29 09/18/2022   ALT 20 09/18/2022   ALKPHOS 84 10/18/2020   BILITOT 0.5 09/18/2022   GFRNONAA >60 10/27/2022   GFRAA >60 10/20/2019    Lab Results  Component Value Date   WBC 9.0 12/13/2022   NEUTROABS 5.7 12/13/2022   HGB 13.0 12/13/2022   HCT 41.1 12/13/2022   MCV 86.7 12/13/2022   PLT 287 12/13/2022   Lab Results  Component Value Date   IRON 76 09/18/2022   TIBC 311 09/18/2022   IRONPCTSAT 24 09/18/2022   Lab Results  Component Value Date   FERRITIN 31 09/18/2022     STUDIES: No results found.  ASSESSMENT: Probable stage Ia left lower lobe lung cancer, history of iron deficiency anemia.  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  She completed treatment in approximately May 2021.  Her most recent CT scan on April 19, 2022 reviewed independently with no obvious evidence of recurrent or progressive disease.  Given her recent worsening of shortness of breath, will repeat CT scan next week.  Return to clinic in 2 weeks to discuss the results. History of iron deficiency anemia: Hemoglobin continues to be within normal limits at 13.0.  Iron, B12, folate are pending at time of dictation.  Patient last received IV Feraheme on October 26, 2016. Shortness of breath: CT scan as above.  Patient also has been instructed to keep her follow-up appointments with pulmonary as scheduled. Weakness and fatigue: Likely multifactorial.  Laboratory work and imaging as above.  Have also drawn thyroid panel for completeness.  I spent a total of 30 minutes reviewing chart data, face-to-face evaluation with the patient, counseling and coordination of care as detailed above.    Jeralyn Ruths, MD   12/13/2022 11:37 AM

## 2022-12-14 ENCOUNTER — Encounter: Payer: Self-pay | Admitting: Family Medicine

## 2022-12-14 LAB — THYROID PANEL WITH TSH
Free Thyroxine Index: 2.1 (ref 1.2–4.9)
T3 Uptake Ratio: 22 % — ABNORMAL LOW (ref 24–39)
T4, Total: 9.5 ug/dL (ref 4.5–12.0)
TSH: 1.28 u[IU]/mL (ref 0.450–4.500)

## 2022-12-16 ENCOUNTER — Other Ambulatory Visit: Payer: Self-pay | Admitting: Family Medicine

## 2022-12-16 DIAGNOSIS — D509 Iron deficiency anemia, unspecified: Secondary | ICD-10-CM

## 2022-12-18 ENCOUNTER — Other Ambulatory Visit: Payer: Self-pay | Admitting: Family Medicine

## 2022-12-18 DIAGNOSIS — D509 Iron deficiency anemia, unspecified: Secondary | ICD-10-CM

## 2022-12-19 ENCOUNTER — Encounter: Payer: Self-pay | Admitting: Family Medicine

## 2022-12-20 ENCOUNTER — Other Ambulatory Visit: Payer: Self-pay | Admitting: Family Medicine

## 2022-12-20 DIAGNOSIS — D509 Iron deficiency anemia, unspecified: Secondary | ICD-10-CM

## 2022-12-20 NOTE — Telephone Encounter (Signed)
I think maybe because she is seeing [facility] pulmonology and maybe he wants them to fill her medication possibly

## 2022-12-21 ENCOUNTER — Other Ambulatory Visit: Payer: Self-pay | Admitting: Family Medicine

## 2022-12-21 ENCOUNTER — Ambulatory Visit
Admission: RE | Admit: 2022-12-21 | Discharge: 2022-12-21 | Disposition: A | Discharge status: Home / Self Care | Payer: Medicare PPO | Source: Ambulatory Visit | Attending: Oncology | Admitting: Oncology

## 2022-12-21 DIAGNOSIS — C3490 Malignant neoplasm of unspecified part of unspecified bronchus or lung: Secondary | ICD-10-CM | POA: Insufficient documentation

## 2022-12-21 DIAGNOSIS — J432 Centrilobular emphysema: Secondary | ICD-10-CM | POA: Diagnosis not present

## 2022-12-21 DIAGNOSIS — K219 Gastro-esophageal reflux disease without esophagitis: Secondary | ICD-10-CM

## 2022-12-21 DIAGNOSIS — R918 Other nonspecific abnormal finding of lung field: Secondary | ICD-10-CM | POA: Diagnosis not present

## 2022-12-21 MED ORDER — IOHEXOL 300 MG/ML  SOLN
75.0000 mL | Freq: Once | INTRAMUSCULAR | Status: AC | PRN
  Administered 2022-12-21: 75 mL via INTRAVENOUS

## 2022-12-24 NOTE — Telephone Encounter (Signed)
Requested Prescriptions  Pending Prescriptions Disp Refills   omeprazole (PRILOSEC) 20 MG capsule [Pharmacy Med Name: Omeprazole 20 MG Oral Capsule Delayed Release] 90 capsule 0    Sig: TAKE 1 CAPSULE BY MOUTH ONCE DAILY BEFORE BREAKFAST     Gastroenterology: Proton Pump Inhibitors Passed - 12/21/2022  8:17 AM      Passed - Valid encounter within last 12 months    Recent Outpatient Visits           2 months ago Acute non-recurrent frontal sinusitis   [place] [facility] [place], Salvadore Oxford, NP   3 months ago Other iron deficiency anemia   [place] [facility] Smitty Cords, DO   11 months ago Other iron deficiency anemia   [place] Smitty Cords, DO   1 year ago SOB (shortness of breath)   [place] [facility] [place], Oswaldo Conroy, [place]   1 year ago Annual physical exam   [facility] [facility] [place], Netta Neat, [place]

## 2022-12-27 ENCOUNTER — Inpatient Hospital Stay: Payer: Medicare PPO | Admitting: Oncology

## 2022-12-27 ENCOUNTER — Encounter: Payer: Self-pay | Admitting: Oncology

## 2022-12-27 VITALS — BP 146/73 | HR 95 | Temp 98.5°F | Resp 24 | Ht 62.0 in | Wt 196.4 lb

## 2022-12-27 DIAGNOSIS — Z923 Personal history of irradiation: Secondary | ICD-10-CM | POA: Diagnosis not present

## 2022-12-27 DIAGNOSIS — Z862 Personal history of diseases of the blood and blood-forming organs and certain disorders involving the immune mechanism: Secondary | ICD-10-CM | POA: Diagnosis not present

## 2022-12-27 DIAGNOSIS — Z85118 Personal history of other malignant neoplasm of bronchus and lung: Secondary | ICD-10-CM | POA: Diagnosis not present

## 2022-12-27 DIAGNOSIS — R5383 Other fatigue: Secondary | ICD-10-CM | POA: Diagnosis not present

## 2022-12-27 DIAGNOSIS — C3490 Malignant neoplasm of unspecified part of unspecified bronchus or lung: Secondary | ICD-10-CM

## 2022-12-27 DIAGNOSIS — Z08 Encounter for follow-up examination after completed treatment for malignant neoplasm: Secondary | ICD-10-CM | POA: Diagnosis not present

## 2022-12-27 NOTE — Progress Notes (Signed)
[facility]  Telephone:(336) [phone] Fax:(336) [phone]  ID: Elizabeth [name] OB: April 03, 1950  MR#: [id number]  IHK#:[id number]  Patient Care Team: Smitty Cords, DO as PCP - General (Family Medicine) Glory Buff, RN as Oncology Nurse Navigator Carmina Miller, MD as Radiation Oncologist (Radiation Oncology)  CHIEF COMPLAINT: Probable stage Ia left lower lobe lung cancer, history of iron deficiency anemia.  INTERVAL HISTORY: Patient returns to clinic today for further evaluation and discussion of her imaging results.  She continues to have increased weakness and fatigue as well as worsening shortness of breath.  She has no neurologic complaints.  She denies any recent fevers or illnesses.  She has a good appetite and denies weight loss.  She has no chest pain, cough, or hemoptysis.  She denies any nausea, vomiting, constipation, or diarrhea.  She has no urinary complaints.  Patient offers no further specific complaints today.  REVIEW OF SYSTEMS:   Review of Systems  Constitutional:  Positive for malaise/fatigue. Negative for fever and weight loss.  Respiratory:  Positive for shortness of breath. Negative for cough and hemoptysis.   Cardiovascular: Negative.  Negative for chest pain and leg swelling.  Gastrointestinal: Negative.  Negative for abdominal pain.  Genitourinary: Negative.  Negative for dysuria.  Musculoskeletal: Negative.  Negative for back pain.  Skin: Negative.  Negative for rash.  Neurological:  Positive for weakness. Negative for dizziness, focal weakness and headaches.  Psychiatric/Behavioral: Negative.  The patient is not nervous/anxious.     As per HPI. Otherwise, a complete review of systems is negative.  PAST MEDICAL HISTORY: Past Medical History:  Diagnosis Date   Allergy    Anxiety    B12 deficiency    Carpal tunnel syndrome    Centrilobular emphysema (HCC)    COPD (chronic obstructive pulmonary disease) (HCC)    Depression     Genetic testing    Common Cancers panel (47 genes) @ [facility] - No pathogenic mutations detected   HLD (hyperlipidemia)    Hyperlipidemia    Hypertension    Iron deficiency anemia 10/16/2016   Lipoma of skin    Lung nodule    Normocytic anemia 10/16/2016   QT prolongation    Substance abuse (HCC)     PAST SURGICAL HISTORY: Past Surgical History:  Procedure Laterality Date   ABDOMINAL HYSTERECTOMY     BREAST EXCISIONAL BIOPSY Left    1980's neg   CESAREAN SECTION     TUBAL LIGATION      FAMILY HISTORY: Family History  Problem Relation Age of Onset   Ovarian cancer Mother 52       deceased 75   Breast cancer Mother 52   Stroke Father    Lung cancer Paternal Aunt    Lung cancer Cousin     ADVANCED DIRECTIVES (Y/N):  N  HEALTH MAINTENANCE: Social History   Tobacco Use   Smoking status: Every Day    Current packs/day: 0.50    Average packs/day: 0.5 packs/day for 52.0 years (26.0 ttl pk-yrs)    Types: Cigarettes   Smokeless tobacco: Never   Tobacco comments:    5 ciggarette a day--03/02/2020  Vaping Use   Vaping status: Some Days  Substance Use Topics   Alcohol use: No   Drug use: No     Colonoscopy:  PAP:  Bone density:  Lipid panel:  Allergies  Allergen Reactions   Moxifloxacin Itching, Other (See Comments), Photosensitivity, Rash and Swelling    Causes swelling, redness, burning in eyes  Levofloxacin Other (See Comments)    Other reaction(s): Muscle Pain Other reaction(s): Muscle Pain   Amoxicillin-Pot Clavulanate Nausea Only and Other (See Comments)    Other Reaction: DIARRHEA. Other reaction(s): Other (See Comments) Other Reaction: DIARRHEA. Other Reaction: DIARRHEA.  Other reaction(s): Other (See Comments) Other Reaction: DIARRHEA.    Current Outpatient Medications  Medication Sig Dispense Refill   albuterol (VENTOLIN HFA) 108 (90 Base) MCG/ACT inhaler Inhale 2 puffs into the lungs every 6 (six) hours as needed for wheezing or shortness  of breath. 8 g 5   cyanocobalamin (VITAMIN B12) 1000 MCG/ML injection INJECT 1 ML INTO THE MUSCLE EVERY 30 DAYS 3 mL 1   fluticasone (FLONASE) 50 MCG/ACT nasal spray Place 2 sprays into both nostrils daily.  1   furosemide (LASIX) 20 MG tablet Take 20 mg by mouth daily.  0   losartan (COZAAR) 50 MG tablet Take 1 tablet (50 mg total) by mouth daily. 90 tablet 1   magnesium oxide (MAG-OX) 400 MG tablet Take 1 tablet (400 mg total) by mouth daily. 90 tablet 3   Multiple Vitamin (MULTI-VITAMINS) TABS Take by mouth.     omeprazole (PRILOSEC) 20 MG capsule TAKE 1 CAPSULE BY MOUTH ONCE DAILY BEFORE BREAKFAST 90 capsule 0   potassium chloride SA (KLOR-CON M) 20 MEQ tablet Take 1 tablet (20 mEq total) by mouth daily. 90 tablet 1   simvastatin (ZOCOR) 80 MG tablet Take 1 tablet (80 mg total) by mouth at bedtime. 90 tablet 0   SPIRIVA HANDIHALER 18 MCG inhalation capsule Place 1 capsule (18 mcg total) into inhaler and inhale daily. 90 capsule 3   SYMBICORT 160-4.5 MCG/ACT inhaler Inhale 2 puffs into the lungs.     venlafaxine XR (EFFEXOR-XR) 75 MG 24 hr capsule TAKE 3 CAPSULES BY MOUTH ONCE DAILY WITH BREAKFAST 270 capsule 1   No current facility-administered medications for this visit.    OBJECTIVE: Vitals:   12/27/22 1003  BP: (!) 146/73  Pulse: 95  Resp: (!) 24  Temp: 98.5 F (36.9 C)  SpO2: 97%      Body mass index is 35.92 kg/m.    ECOG FS:1 - Symptomatic but completely ambulatory  General: Well-developed, well-nourished, no acute distress. Eyes: Pink conjunctiva, anicteric sclera. HEENT: Normocephalic, moist mucous membranes. Lungs: No audible wheezing or coughing. Heart: Regular rate and rhythm. Abdomen: Soft, nontender, no obvious distention. Musculoskeletal: No edema, cyanosis, or clubbing. Neuro: Alert, answering all questions appropriately. Cranial nerves grossly intact. Skin: No rashes or petechiae noted. Psych: Normal affect.  LAB RESULTS:  Lab Results  Component Value  Date   NA 137 10/27/2022   K 3.6 10/27/2022   CL 101 10/27/2022   CO2 26 10/27/2022   GLUCOSE 108 (H) 10/27/2022   BUN 10 10/27/2022   CREATININE 0.83 10/27/2022   CALCIUM 8.6 (L) 10/27/2022   PROT 6.4 09/18/2022   ALBUMIN 3.8 10/18/2020   AST 29 09/18/2022   ALT 20 09/18/2022   ALKPHOS 84 10/18/2020   BILITOT 0.5 09/18/2022   GFRNONAA >60 10/27/2022   GFRAA >60 10/20/2019    Lab Results  Component Value Date   WBC 9.0 12/13/2022   NEUTROABS 5.7 12/13/2022   HGB 13.0 12/13/2022   HCT 41.1 12/13/2022   MCV 86.7 12/13/2022   PLT 287 12/13/2022   Lab Results  Component Value Date   IRON 119 12/13/2022   TIBC 353 12/13/2022   IRONPCTSAT 34 (H) 12/13/2022   Lab Results  Component Value Date   FERRITIN  66 12/13/2022     STUDIES: CT Chest W Contrast  Result Date: 12/26/2022 CLINICAL DATA:  Left lung cancer restaging * Tracking Code: BO * EXAM: CT CHEST WITH CONTRAST TECHNIQUE: Multidetector CT imaging of the chest was performed during intravenous contrast administration. RADIATION DOSE REDUCTION: This exam was performed according to the departmental dose-optimization program which includes automated exposure control, adjustment of the mA and/or kV according to patient size and/or use of iterative reconstruction technique. CONTRAST:  75mL OMNIPAQUE IOHEXOL 300 MG/ML  SOLN COMPARISON:  04/19/2022 FINDINGS: Cardiovascular: Aortic atherosclerosis. Normal heart size. Left coronary artery calcifications. No pericardial effusion. Mediastinum/Nodes: No enlarged mediastinal, hilar, or axillary lymph nodes. Thyroid gland, trachea, and esophagus demonstrate no significant findings. Lungs/Pleura: Mild centrilobular emphysema. Diffuse bilateral bronchial wall thickening. Background of fine centrilobular nodularity, most concentrated in the lung apices. Numerous small unchanged small solid and ground-glass nodules, for example a 0.5 cm solid nodule of the extreme superior segment right lower  lobe (series 3, image 29) and a 0.4 cm ground-glass nodule of the posterior right upper lobe (series 3, image 37). Unchanged small focus of radiation fibrosis and irregular scarring in the peripheral left lower lobe (series 3, image 65). No pleural effusion or pneumothorax. Upper Abdomen: No acute abnormality. Hepatic steatosis. Small gallstones. 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 Musculoskeletal: No chest wall abnormality. Unchanged chronic, sclerotic fractures of the posterolateral left sixth rib underlying radiation change (series 2, image 58). IMPRESSION: 1. Unchanged small focus of radiation fibrosis and irregular scarring in the peripheral left lower lobe. 2. Unchanged chronic, sclerotic fractures of the posterolateral left sixth rib underlying radiation change. 3. Numerous small unchanged small solid and ground-glass nodules, measuring up to 0.5 cm. These are most likely benign and incidental. Continued attention on follow-up. 4. Emphysema with background of fine centrilobular nodularity, most concentrated in the lung apices, consistent with smoking-related respiratory bronchiolitis. 5. Cholelithiasis. 6. Coronary artery disease. Aortic Atherosclerosis (ICD10-I70.0) and Emphysema (ICD10-J43.9). Electronically Signed   By: Jearld Lesch M.D.   On: 12/26/2022 11:07    ASSESSMENT: Probable stage Ia left lower lobe lung cancer, history of iron deficiency anemia.  PLAN:    Probable stage Ia left lower lobe lung cancer: No biopsy was performed and patient proceeded directly to SBRT.  She completed treatment in approximately May 2021.  Patient's most recent CT scan on December 26, 2022 reviewed independently and report as above with no obvious evidence of recurrent or progressive disease.  Patient is now 3 years removed from completing her treatments and can be evaluated  on a yearly basis through May 2026.  Return to clinic in 1 year with repeat imaging and further evaluation.   History of iron deficiency anemia: Hemoglobin continues to be within normal limits at 13.0.  Iron stores, B12, and folate are also all within normal limits.  No intervention is needed.  Patient last received IV Feraheme on October 26, 2016. Shortness of breath: CT scan as above.  Patient has been instructed to continue follow-up with pulmonary as indicated. Weakness and fatigue: Likely multifactorial.  Laboratory work and imaging was unrevealing including thyroid panel.   Follow-up with pulmonary as above.      Jeralyn Ruths, MD   12/27/2022 11:54 AM

## 2023-01-02 ENCOUNTER — Telehealth: Payer: Medicare PPO | Admitting: Family Medicine

## 2023-01-02 ENCOUNTER — Encounter: Payer: Self-pay | Admitting: Family Medicine

## 2023-01-02 DIAGNOSIS — J432 Centrilobular emphysema: Secondary | ICD-10-CM

## 2023-01-02 DIAGNOSIS — J441 Chronic obstructive pulmonary disease with (acute) exacerbation: Secondary | ICD-10-CM

## 2023-01-02 DIAGNOSIS — J0110 Acute frontal sinusitis, unspecified: Secondary | ICD-10-CM

## 2023-01-02 MED ORDER — PREDNISONE 20 MG PO TABS: ORAL_TABLET | ORAL | 0 refills | Status: DC

## 2023-01-02 MED ORDER — DOXYCYCLINE HYCLATE 100 MG PO TABS
100.0000 mg | ORAL_TABLET | Freq: Two times a day (BID) | ORAL | 0 refills | Status: DC
Start: 2023-01-02 — End: 2023-04-18

## 2023-01-02 NOTE — Patient Instructions (Signed)
° °  Please schedule a Follow-up Appointment to: No follow-ups on file. ° °If you have any other questions or concerns, please feel free to call the office or send a message through MyChart. You may also schedule an earlier appointment if necessary. ° °Additionally, you may be receiving a survey about your experience at our office within a few days to 1 week by e-mail or mail. We value your feedback. ° °Hawk Mones, DO °[facility] °

## 2023-01-02 NOTE — Progress Notes (Signed)
Subjective:    Patient ID: Elizabeth [name], female    DOB: April 04, 1950, 72 y.o.   MRN: [id number]  Elizabeth [name] is a 72 y.o. female presenting on 01/02/2023 for No chief complaint on file.  Virtual / Telehealth Encounter - Video Visit via MyChart The purpose of this virtual visit is to provide medical care while limiting exposure to the novel coronavirus (COVID19) for both patient and office staff.  Consent was obtained for remote visit:  Yes.   Answered questions that patient had about telehealth interaction:  Yes.   I discussed the limitations, risks, security and privacy concerns of performing an evaluation and management service by video/telephone. I also discussed with the patient that there may be a patient responsible charge related to this service. The patient expressed understanding and agreed to proceed.  Patient Location: Home Provider Location: [facility] (Office)  Participants in virtual visit: - Patient: Elizabeth [name] - CMA: Shirley Muscat CMA - Provider: Dr Althea Charon   HPI  Discussed the use of AI scribe software for clinical note transcription with the patient, who gave verbal consent to proceed.     The patient, with a history of emphysema and recent COVID-19 infection, presents with persistent shortness of breath. They report that this symptom has been ongoing for over 1-2+ weeks since COVID infection  Worse dyspnea with activity. Previous temporary improvement on antibiotics and steroid.  Now with sinus drainage pressure congestion as well.  They are currently on Symbicort and Spiriva for their emphysema, but despite this, they continue to experience significant shortness of breath. The patient also reports a history of sinus issues, which may be contributing to their respiratory symptoms.  The patient has previously tried Clinical cytogeneticist, an inhaler medication, but discontinued it.   In addition to their respiratory issues, the patient also  has a history of cardiac issues, although they do not believe this is contributing to their current symptoms. They last saw their cardiologist approximately three to four months ago.  They express understanding that their shortness of breath is likely a result of a combination of their emphysema, recent COVID-19 infection, and possibly their sinus issues.   She is followed by [facility] last seen in August.        09/17/2022    9:46 AM 09/13/2022    2:00 PM 01/05/2022   10:57 AM  Depression screen PHQ 2/9  Decreased Interest 3  1  Down, Depressed, Hopeless 3 1 1   PHQ - 2 Score 6 1 2   Altered sleeping 3 1 1   Tired, decreased energy 3 1 3   Change in appetite 2  2  Feeling bad or failure about yourself  0  0  Trouble concentrating 1  1  Moving slowly or fidgety/restless 0  0  Suicidal thoughts 0  0  PHQ-9 Score 15 3 9   Difficult doing work/chores  Not difficult at all Not difficult at all    Social History   Tobacco Use   Smoking status: Every Day    Current packs/day: 0.50    Average packs/day: 0.5 packs/day for 52.0 years (26.0 ttl pk-yrs)    Types: Cigarettes   Smokeless tobacco: Never   Tobacco comments:    5 ciggarette a day--03/02/2020  Vaping Use   Vaping status: Some Days  Substance Use Topics   Alcohol use: No   Drug use: No    Review of Systems Per HPI unless specifically indicated above     Objective:  There were no vitals taken for this visit.  Wt Readings from Last 3 Encounters:  12/27/22 196 lb 6.4 oz (89.1 kg)  12/13/22 190 lb (86.2 kg)  10/27/22 188 lb (85.3 kg)    Physical Exam  Note examination was completely remotely via video observation objective data only  Gen - well-appearing, no acute distress or apparent pain, comfortable HEENT - eyes appear clear without discharge or redness Heart/Lungs - cannot examine virtually - observed evidence of coughing  Abd - cannot examine virtually  Skin - face visible today- no rash Neuro - awake,  alert, oriented Psych - not anxious appearing   I have personally reviewed the radiology report from 12/21/22 on CT Chest.  CLINICAL DATA:  Left lung cancer restaging * Tracking Code: BO *   EXAM: CT CHEST WITH CONTRAST   TECHNIQUE: Multidetector CT imaging of the chest was performed during intravenous contrast administration.   RADIATION DOSE REDUCTION: This exam was performed according to the departmental dose-optimization program which includes automated exposure control, adjustment of the mA and/or kV according to patient size and/or use of iterative reconstruction technique.   CONTRAST:  75mL OMNIPAQUE IOHEXOL 300 MG/ML  SOLN   COMPARISON:  04/19/2022   FINDINGS: Cardiovascular: Aortic atherosclerosis. Normal heart size. Left coronary artery calcifications. No pericardial effusion.   Mediastinum/Nodes: No enlarged mediastinal, hilar, or axillary lymph nodes. Thyroid gland, trachea, and esophagus demonstrate no significant findings.   Lungs/Pleura: Mild centrilobular emphysema. Diffuse bilateral bronchial wall thickening. Background of fine centrilobular nodularity, most concentrated in the lung apices. Numerous small unchanged small solid and ground-glass nodules, for example a 0.5 cm solid nodule of the extreme superior segment right lower lobe (series 3, image 29) and a 0.4 cm ground-glass nodule of the posterior right upper lobe (series 3, image 37). Unchanged small focus of radiation fibrosis and irregular scarring in the peripheral left lower lobe (series 3, image 65). No pleural effusion or pneumothorax.   Upper Abdomen: No acute abnormality. Hepatic steatosis. Small gallstones. 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   Musculoskeletal: No chest wall abnormality. Unchanged chronic, sclerotic fractures of the posterolateral  left sixth rib underlying radiation change (series 2, image 58).   IMPRESSION: 1. Unchanged small focus of radiation fibrosis and irregular scarring in the peripheral left lower lobe. 2. Unchanged chronic, sclerotic fractures of the posterolateral left sixth rib underlying radiation change. 3. Numerous small unchanged small solid and ground-glass nodules, measuring up to 0.5 cm. These are most likely benign and incidental. Continued attention on follow-up. 4. Emphysema with background of fine centrilobular nodularity, most concentrated in the lung apices, consistent with smoking-related respiratory bronchiolitis. 5. Cholelithiasis. 6. Coronary artery disease.   Aortic Atherosclerosis (ICD10-I70.0) and Emphysema (ICD10-J43.9).     Electronically Signed   By: Jearld Lesch M.D.   On: 12/26/2022 11:07  Results for orders placed or performed in visit on 12/13/22  Folate  Result Value Ref Range   Folate 21.4 >5.9 ng/mL  Vitamin B12  Result Value Ref Range   Vitamin B-12 580 180 - 914 pg/mL  Thyroid Panel With TSH  Result Value Ref Range   TSH 1.280 0.450 - 4.500 uIU/mL   T4, Total 9.5 4.5 - 12.0 ug/dL   T3 Uptake Ratio 22 (L) 24 - 39 %   Free Thyroxine Index 2.1 1.2 - 4.9  Ferritin  Result Value Ref Range   Ferritin  66 11 - 307 ng/mL  Iron and TIBC([facility]corp/Sunquest)  Result Value Ref Range   Iron 119 28 - 170 ug/dL   TIBC 629 528 - 413 ug/dL   Saturation Ratios 34 (H) 10.4 - 31.8 %   UIBC 234 ug/dL  CBC with Differential/Platelet  Result Value Ref Range   WBC 9.0 4.0 - 10.5 K/uL   RBC 4.74 3.87 - 5.11 MIL/uL   Hemoglobin 13.0 12.0 - 15.0 g/dL   HCT 24.4 01.0 - 27.2 %   MCV 86.7 80.0 - 100.0 fL   MCH 27.4 26.0 - 34.0 pg   MCHC 31.6 30.0 - 36.0 g/dL   RDW 53.6 64.4 - 03.4 %   Platelets 287 150 - 400 K/uL   nRBC 0.0 0.0 - 0.2 %   Neutrophils Relative % 63 %   Neutro Abs 5.7 1.7 - 7.7 K/uL   Lymphocytes Relative 27 %   Lymphs Abs 2.5 0.7 - 4.0 K/uL   Monocytes  Relative 8 %   Monocytes Absolute 0.8 0.1 - 1.0 K/uL   Eosinophils Relative 1 %   Eosinophils Absolute 0.1 0.0 - 0.5 K/uL   Basophils Relative 0 %   Basophils Absolute 0.0 0.0 - 0.1 K/uL   Immature Granulocytes 1 %   Abs Immature Granulocytes 0.09 (H) 0.00 - 0.07 K/uL      Assessment & Plan:   Problem List Items Addressed This Visit     Centrilobular emphysema (HCC)   Relevant Medications   predniSONE (DELTASONE) 20 MG tablet   Other Visit Diagnoses     COPD with acute exacerbation (HCC)    -  Primary   Relevant Medications   doxycycline (VIBRA-TABS) 100 MG tablet   predniSONE (DELTASONE) 20 MG tablet   Acute non-recurrent frontal sinusitis       Relevant Medications   doxycycline (VIBRA-TABS) 100 MG tablet   predniSONE (DELTASONE) 20 MG tablet       Assessment and Plan    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 Discussed the potential for prolonged respiratory symptoms following COVID-19  -Consider breathing exercises and temporary use of steroids or inhalers as needed.  Chronic Obstructive Pulmonary Disease (COPD) Exacerbation of symptoms, currently on Symbicort and Spiriva. Discussed the role of emphysema and COPD in contributing to shortness of breath. -Continue Symbicort and Spiriva OR can switch to [employer] 2 puff BID -Consider discussing with pulmonologist about potential upgrade of inhaler to [facility] or Trelegy. Again - or other options. Return to [facility]  Sinus Infection Symptoms suggestive of sinus infection in the context of COPD and post-COVID syndrome. -Order Doxycycline twice a day for ten days. -Order steroid taper to manage symptoms.    Meds ordered this encounter  Medications   doxycycline (VIBRA-TABS) 100 MG tablet    Sig: Take 1 tablet (100 mg total) by mouth 2 (two) times daily. For 10 days. Take with full glass of water, stay upright 30 min after taking.    Dispense:  20 tablet    Refill:  0    predniSONE (DELTASONE) 20 MG tablet    Sig: Take 2 tablets daily (40mg ) for 4 days, take 1 tab daily (20mg ) for 4 days, take half tab daily (10mg ) for 4 days    Dispense:  14 tablet    Refill:  0      Follow up plan: Return if symptoms worsen or fail to improve.   Patient verbalizes understanding with the above medical recommendations including the  limitation of remote medical advice.  Specific follow-up and call-back criteria were given for patient to follow-up or seek medical care more urgently if needed.  Total duration of direct patient care provided via video conference: 10 minutes   Saralyn Pilar, DO [facility] 01/02/2023, 4:07 PM

## 2023-01-06 ENCOUNTER — Encounter: Payer: Self-pay | Admitting: Family Medicine

## 2023-01-06 DIAGNOSIS — I10 Essential (primary) hypertension: Secondary | ICD-10-CM

## 2023-01-07 MED ORDER — LOSARTAN POTASSIUM 50 MG PO TABS
50.0000 mg | ORAL_TABLET | Freq: Every day | ORAL | 1 refills | Status: DC
Start: 2023-01-07 — End: 2023-07-12

## 2023-01-07 MED ORDER — FUROSEMIDE 20 MG PO TABS: 20.0000 mg | ORAL_TABLET | Freq: Every day | ORAL | 1 refills | Status: DC

## 2023-01-11 ENCOUNTER — Encounter: Payer: Self-pay | Admitting: Family Medicine

## 2023-01-15 ENCOUNTER — Telehealth (INDEPENDENT_AMBULATORY_CARE_PROVIDER_SITE_OTHER): Payer: Medicare PPO | Admitting: Family Medicine

## 2023-01-15 ENCOUNTER — Encounter: Payer: Self-pay | Admitting: Family Medicine

## 2023-01-15 DIAGNOSIS — J432 Centrilobular emphysema: Secondary | ICD-10-CM

## 2023-01-15 DIAGNOSIS — R0609 Other forms of dyspnea: Secondary | ICD-10-CM

## 2023-01-15 DIAGNOSIS — U099 Post covid-19 condition, unspecified: Secondary | ICD-10-CM

## 2023-01-15 MED ORDER — PREDNISONE 20 MG PO TABS: ORAL_TABLET | ORAL | 0 refills | Status: DC

## 2023-01-15 NOTE — Patient Instructions (Signed)
Please schedule a Follow-up Appointment to: No follow-ups on file.  If you have any other questions or concerns, please feel free to call the office or send a message through MyChart. You may also schedule an earlier appointment if necessary.  Additionally, you may be receiving a survey about your experience at our office within a few days to 1 week by e-mail or mail. We value your feedback.  Saralyn Pilar, DO [facility] [place], [place]

## 2023-01-15 NOTE — Progress Notes (Signed)
Subjective:    Patient ID: Elizabeth [name], female    DOB: 01-Jan-1951, 72 y.o.   MRN: [id number]  Elizabeth [name] is a 72 y.o. female presenting on 01/15/2023 for No chief complaint on file.  Virtual / Telehealth Encounter - Video Visit via MyChart The purpose of this virtual visit is to provide medical care while limiting exposure to the novel coronavirus (COVID19) for both patient and office staff.  Consent was obtained for remote visit:  Yes.   Answered questions that patient had about telehealth interaction:  Yes.   I discussed the limitations, risks, security and privacy concerns of performing an evaluation and management service by video/telephone. I also discussed with the patient that there may be a patient responsible charge related to this service. The patient expressed understanding and agreed to proceed.  Patient Location: Home Provider Location: [facility] (Office)  Participants in virtual visit: - Patient: Elizabeth [name] - CMA: Shirley Muscat CMA - Provider: Dr Althea Charon  HPI   Discussed the use of AI scribe software for clinical note transcription with the patient, who gave verbal consent to proceed.     The patient, with a history of COPD and recent COVID-19 infection, continues to experience persistent symptoms, despite recent courses of steroids and antibiotics. The primary concern is the ongoing shortness of breath, which has been gradually improving. The patient has been using inhalers for symptom management, which have been beneficial.  The patient recently completed a 12-day course of prednisone and a 10-day course of doxycycline. Despite these treatments, the patient continues to experience shortness of breath, particularly noticeable during physical exertion such as walking.  She does admit some mild improvement since onset of symptoms however that is reassuring  The patient has been in contact with pulmonology at [facility] and has a routine  appointment scheduled in December. However, due to the ongoing symptoms, the patient is seeking an earlier appointment.  The patient has also undergone a CT scan a month ago.   The patient's symptoms are consistent with long COVID, a condition that can persist for months after the initial infection. The patient is understandably frustrated with the slow recovery and is seeking further treatment options.         09/17/2022    9:46 AM 09/13/2022    2:00 PM 01/05/2022   10:57 AM  Depression screen PHQ 2/9  Decreased Interest 3  1  Down, Depressed, Hopeless 3 1 1   PHQ - 2 Score 6 1 2   Altered sleeping 3 1 1   Tired, decreased energy 3 1 3   Change in appetite 2  2  Feeling bad or failure about yourself  0  0  Trouble concentrating 1  1  Moving slowly or fidgety/restless 0  0  Suicidal thoughts 0  0  PHQ-9 Score 15 3 9   Difficult doing work/chores  Not difficult at all Not difficult at all    Social History   Tobacco Use   Smoking status: Every Day    Current packs/day: 0.50    Average packs/day: 0.5 packs/day for 52.0 years (26.0 ttl pk-yrs)    Types: Cigarettes   Smokeless tobacco: Never   Tobacco comments:    5 ciggarette a day--03/02/2020  Vaping Use   Vaping status: Some Days  Substance Use Topics   Alcohol use: No   Drug use: No    Review of Systems Per HPI unless specifically indicated above     Objective:    There were no  vitals taken for this visit.  Wt Readings from Last 3 Encounters:  12/27/22 196 lb 6.4 oz (89.1 kg)  12/13/22 190 lb (86.2 kg)  10/27/22 188 lb (85.3 kg)    Physical Exam  Note examination was completely remotely via video observation objective data only  Gen - well-appearing, no acute distress or apparent pain, comfortable HEENT - eyes appear clear without discharge or redness Heart/Lungs - cannot examine virtually - observed no evidence of coughing or labored breathing. Abd - cannot examine virtually  Skin - face visible today- no  rash Neuro - awake, alert, oriented Psych - not anxious appearing   Results for orders placed or performed in visit on 12/13/22  Folate  Result Value Ref Range   Folate 21.4 >5.9 ng/mL  Vitamin B12  Result Value Ref Range   Vitamin B-12 580 180 - 914 pg/mL  Thyroid Panel With TSH  Result Value Ref Range   TSH 1.280 0.450 - 4.500 uIU/mL   T4, Total 9.5 4.5 - 12.0 ug/dL   T3 Uptake Ratio 22 (L) 24 - 39 %   Free Thyroxine Index 2.1 1.2 - 4.9  Ferritin  Result Value Ref Range   Ferritin 66 11 - 307 ng/mL  Iron and TIBC([facility])  Result Value Ref Range   Iron 119 28 - 170 ug/dL   TIBC 562 130 - 865 ug/dL   Saturation Ratios 34 (H) 10.4 - 31.8 %   UIBC 234 ug/dL  CBC with Differential/Platelet  Result Value Ref Range   WBC 9.0 4.0 - 10.5 K/uL   RBC 4.74 3.87 - 5.11 MIL/uL   Hemoglobin 13.0 12.0 - 15.0 g/dL   HCT 78.4 69.6 - 29.5 %   MCV 86.7 80.0 - 100.0 fL   MCH 27.4 26.0 - 34.0 pg   MCHC 31.6 30.0 - 36.0 g/dL   RDW 28.4 13.2 - 44.0 %   Platelets 287 150 - 400 K/uL   nRBC 0.0 0.0 - 0.2 %   Neutrophils Relative % 63 %   Neutro Abs 5.7 1.7 - 7.7 K/uL   Lymphocytes Relative 27 %   Lymphs Abs 2.5 0.7 - 4.0 K/uL   Monocytes Relative 8 %   Monocytes Absolute 0.8 0.1 - 1.0 K/uL   Eosinophils Relative 1 %   Eosinophils Absolute 0.1 0.0 - 0.5 K/uL   Basophils Relative 0 %   Basophils Absolute 0.0 0.0 - 0.1 K/uL   Immature Granulocytes 1 %   Abs Immature Granulocytes 0.09 (H) 0.00 - 0.07 K/uL      Assessment & Plan:   Problem List Items Addressed This Visit     Centrilobular emphysema (HCC)   Relevant Medications   predniSONE (DELTASONE) 20 MG tablet   Shortness of breath   Relevant Medications   predniSONE (DELTASONE) 20 MG tablet   Other Visit Diagnoses     Long COVID    -  Primary   Relevant Medications   predniSONE (DELTASONE) 20 MG tablet       Assessment and Plan    Post-Acute Sequelae of SARS-CoV-2 infection (PASC) / Long COVID Persistent  shortness of breath despite recent courses of antibiotics and steroids. No evidence of infection. Discussed the variable timeline for recovery and the lack of proven treatments beyond symptomatic management. -Continue inhalers as prescribed. -Contact [facility] for an earlier appointment. -Consider repeat course of prednisone in early November if symptoms persist. A one-week taper will be prescribed and can be started after a 2-week break from  the last course.  COPD Chronic condition, likely contributing to persistent shortness of breath. -Continue inhalers as prescribed. -Contact [facility] for an earlier appointment.        Meds ordered this encounter  Medications   predniSONE (DELTASONE) 20 MG tablet    Sig: Take daily with food. Start with 60mg  (3 pills) x 2 days, then reduce to 40mg  (2 pills) x 2 days, then 20mg  (1 pill) x 3 days    Dispense:  13 tablet    Refill:  0      Follow up plan: Return if symptoms worsen or fail to improve.   Patient verbalizes understanding with the above medical recommendations including the limitation of remote medical advice.  Specific follow-up and call-back criteria were given for patient to follow-up or seek medical care more urgently if needed.  Total duration of direct patient care provided via video conference: 12 minutes   Saralyn Pilar, DO [facility] 01/15/2023, 11:50 AM

## 2023-01-21 ENCOUNTER — Ambulatory Visit: Payer: Medicare PPO | Admitting: Family Medicine

## 2023-01-29 ENCOUNTER — Encounter: Payer: Self-pay | Admitting: Family Medicine

## 2023-01-29 ENCOUNTER — Other Ambulatory Visit: Payer: Self-pay | Admitting: Family Medicine

## 2023-01-29 DIAGNOSIS — E538 Deficiency of other specified B group vitamins: Secondary | ICD-10-CM

## 2023-01-29 DIAGNOSIS — D510 Vitamin B12 deficiency anemia due to intrinsic factor deficiency: Secondary | ICD-10-CM

## 2023-01-30 MED ORDER — CYANOCOBALAMIN 1000 MCG/ML IJ SOLN: 1000.0000 ug | INTRAMUSCULAR | 1 refills | Status: DC

## 2023-01-30 NOTE — Telephone Encounter (Signed)
[facility] received refill 01/30/23. Requested Prescriptions  Refused Prescriptions Disp Refills   cyanocobalamin (VITAMIN B12) 1000 MCG/ML injection [Pharmacy Med Name: Cyanocobalamin 1000 MCG/ML Injection Solution] 3 mL 0    Sig: INJECT INTO THE MUSCLE EVERY 30 DAYS     Endocrinology:  Vitamins - Vitamin B12 Passed - 01/29/2023  5:39 PM      Passed - HCT in normal range and within 360 days    HCT  Date Value Ref Range Status  12/13/2022 41.1 36.0 - 46.0 % Final  05/06/2014 40.0 35.0 - 47.0 % Final         Passed - HGB in normal range and within 360 days    Hemoglobin  Date Value Ref Range Status  12/13/2022 13.0 12.0 - 15.0 g/dL Final   HGB  Date Value Ref Range Status  05/06/2014 13.2 12.0 - 16.0 g/dL Final         Passed - B12 Level in normal range and within 360 days    Vitamin B-12  Date Value Ref Range Status  12/13/2022 580 180 - 914 pg/mL Final    Comment:    (NOTE) This assay is not validated for testing neonatal or myeloproliferative syndrome specimens for Vitamin B12 levels. Performed at [facility], [address] [place], [place] [address]          Passed - Valid encounter within last 12 months    Recent Outpatient Visits           2 weeks ago Long COVID   [place] [facility] [place], Netta Neat, DO   4 weeks ago COPD with acute exacerbation [facility])   [place] [facility] Smitty Cords, DO   3 months ago Acute non-recurrent frontal sinusitis   [place] [facility] [place], Salvadore Oxford, NP   4 months ago Other iron deficiency anemia   [place] [facility] Smitty Cords, DO   1 year ago Other iron deficiency anemia   [place] [facility] [place], Netta Neat, [place]

## 2023-02-03 ENCOUNTER — Other Ambulatory Visit: Payer: Self-pay | Admitting: Internal Medicine

## 2023-02-03 DIAGNOSIS — E782 Mixed hyperlipidemia: Secondary | ICD-10-CM

## 2023-02-05 NOTE — Telephone Encounter (Signed)
Requested Prescriptions  Pending Prescriptions Disp Refills   simvastatin (ZOCOR) 80 MG tablet [Pharmacy Med Name: Simvastatin 80 MG Oral Tablet] 90 tablet 0    Sig: TAKE 1 TABLET BY MOUTH AT BEDTIME     Cardiovascular:  Antilipid - Statins Failed - 02/03/2023  1:11 PM      Failed - Lipid Panel in normal range within the last 12 months    Cholesterol  Date Value Ref Range Status  09/28/2021 181 <200 mg/dL Final   LDL Cholesterol (Calc)  Date Value Ref Range Status  09/28/2021 99 mg/dL (calc) Final    Comment:    Reference range: <100 . Desirable range <100 mg/dL for primary prevention;   <70 mg/dL for patients with CHD or diabetic patients  with > or = 2 CHD risk factors. Marland Kitchen LDL-C is now calculated using the Martin-Hopkins  calculation, which is a validated novel method providing  better accuracy than the Friedewald equation in the  estimation of LDL-C.  Horald Pollen et al. Lenox Ahr. 1191;478(29): 2061-2068  (http://education.[facility].com/faq/FAQ164)    HDL  Date Value Ref Range Status  09/28/2021 55 > OR = 50 mg/dL Final   Triglycerides  Date Value Ref Range Status  09/28/2021 171 (H) <150 mg/dL Final         Passed - Patient is not pregnant      Passed - Valid encounter within last 12 months    Recent Outpatient Visits           3 weeks ago Long COVID   [place] [facility] [place], Netta Neat, DO   1 month ago COPD with acute exacerbation [facility])   [place] [facility] Smitty Cords, DO   3 months ago Acute non-recurrent frontal sinusitis   [place] [facility] [place], Salvadore Oxford, NP   4 months ago Other iron deficiency anemia   [employer] [facility] Smitty Cords, DO   1 year ago Other iron deficiency anemia   [place] [facility] [place], Netta Neat, [place]

## 2023-02-10 ENCOUNTER — Other Ambulatory Visit: Payer: Self-pay | Admitting: Family Medicine

## 2023-02-10 DIAGNOSIS — F331 Major depressive disorder, recurrent, moderate: Secondary | ICD-10-CM

## 2023-02-12 NOTE — Telephone Encounter (Signed)
Unable to refill per protocol, Rx expired. Discontinued 09/17/22.  Requested Prescriptions  Pending Prescriptions Disp Refills   ARIPiprazole (ABILIFY) 5 MG tablet [Pharmacy Med Name: ARIPiprazole 5 MG Oral Tablet] 90 tablet 0    Sig: Take 1 tablet by mouth once daily     Not Delegated - Psychiatry:  Antipsychotics - Second Generation (Atypical) - aripiprazole Failed - 02/10/2023 11:29 AM      Failed - This refill cannot be delegated      Failed - Last BP in normal range    BP Readings from Last 1 Encounters:  12/27/22 (!) 146/73         Failed - Lipid Panel in normal range within the last 12 months    Cholesterol  Date Value Ref Range Status  09/28/2021 181 <200 mg/dL Final   LDL Cholesterol (Calc)  Date Value Ref Range Status  09/28/2021 99 mg/dL (calc) Final    Comment:    Reference range: <100 . Desirable range <100 mg/dL for primary prevention;   <70 mg/dL for patients with CHD or diabetic patients  with > or = 2 CHD risk factors. Marland Kitchen LDL-C is now calculated using the Martin-Hopkins  calculation, which is a validated novel method providing  better accuracy than the Friedewald equation in the  estimation of LDL-C.  Horald Pollen et al. Lenox Ahr. 5284;132(44): 2061-2068  (http://education.[facility].com/faq/FAQ164)    HDL  Date Value Ref Range Status  09/28/2021 55 > OR = 50 mg/dL Final   Triglycerides  Date Value Ref Range Status  09/28/2021 171 (H) <150 mg/dL Final         Failed - CMP within normal limits and completed in the last 12 months    Albumin  Date Value Ref Range Status  10/18/2020 3.8 3.5 - 5.0 g/dL Final   Alkaline Phosphatase  Date Value Ref Range Status  10/18/2020 84 38 - 126 U/L Final   Alkaline phosphatase (APISO)  Date Value Ref Range Status  09/18/2022 100 37 - 153 U/L Final   ALT  Date Value Ref Range Status  09/18/2022 20 6 - 29 U/L Final   AST  Date Value Ref Range Status  09/18/2022 29 10 - 35 U/L Final   BUN  Date Value  Ref Range Status  10/27/2022 10 8 - 23 mg/dL Final  04/04/7251 6 (L) 7 - 18 mg/dL Final   Calcium  Date Value Ref Range Status  10/27/2022 8.6 (L) 8.9 - 10.3 mg/dL Final   Calcium, Total  Date Value Ref Range Status  05/06/2014 8.8 8.5 - 10.1 mg/dL Final   CO2  Date Value Ref Range Status  10/27/2022 26 22 - 32 mmol/L Final   Co2  Date Value Ref Range Status  05/06/2014 27 21 - 32 mmol/L Final   Bicarbonate  Date Value Ref Range Status  06/25/2019 32.0 (H) 20.0 - 28.0 mmol/L Final   Creat  Date Value Ref Range Status  09/18/2022 0.74 0.60 - 1.00 mg/dL Final   Creatinine, Ser  Date Value Ref Range Status  10/27/2022 0.83 0.44 - 1.00 mg/dL Final   Glucose  Date Value Ref Range Status  05/06/2014 101 (H) 65 - 99 mg/dL Final   Glucose, Bld  Date Value Ref Range Status  10/27/2022 108 (H) 70 - 99 mg/dL Final    Comment:    Glucose reference range applies only to samples taken after fasting for at least 8 hours.   Glucose-Capillary  Date Value Ref Range Status  10/27/2022  132 (H) 70 - 99 mg/dL Final    Comment:    Glucose reference range applies only to samples taken after fasting for at least 8 hours.   Potassium  Date Value Ref Range Status  10/27/2022 3.6 3.5 - 5.1 mmol/L Final  05/06/2014 3.5 3.5 - 5.1 mmol/L Final   Sodium  Date Value Ref Range Status  10/27/2022 137 135 - 145 mmol/L Final  05/06/2014 140 136 - 145 mmol/L Final   Total Bilirubin  Date Value Ref Range Status  09/18/2022 0.5 0.2 - 1.2 mg/dL Final   Protein, ur  Date Value Ref Range Status  10/27/2022 NEGATIVE NEGATIVE mg/dL Final   Total Protein  Date Value Ref Range Status  09/18/2022 6.4 6.1 - 8.1 g/dL Final   EGFR (African American)  Date Value Ref Range Status  05/06/2014 >60 >40mL/min Final   GFR calc Af Amer  Date Value Ref Range Status  10/20/2019 >60 >60 mL/min Final   eGFR  Date Value Ref Range Status  09/18/2022 85 > OR = 60 mL/min/1.31m2 Final   EGFR  (Non-African Amer.)  Date Value Ref Range Status  05/06/2014 >60 >3mL/min Final    Comment:    eGFR values <34mL/min/1.73 m2 may be an indication of chronic kidney disease (CKD). Calculated eGFR, using the MRDR Study equation, is useful in  patients with stable renal function. The eGFR calculation will not be reliable in acutely ill patients when serum creatinine is changing rapidly. It is not useful in patients on dialysis. The eGFR calculation may not be applicable to patients at the low and high extremes of body sizes, pregnant women, and vegetarians.    GFR, Estimated  Date Value Ref Range Status  10/27/2022 >60 >60 mL/min Final    Comment:    (NOTE) Calculated using the CKD-EPI Creatinine Equation (2021)          Passed - TSH in normal range and within 360 days    TSH  Date Value Ref Range Status  12/13/2022 1.280 0.450 - 4.500 uIU/mL Final         Passed - Completed PHQ-2 or PHQ-9 in the last 360 days      Passed - Last Heart Rate in normal range    Pulse Readings from Last 1 Encounters:  12/27/22 95         Passed - Valid encounter within last 6 months    Recent Outpatient Visits           4 weeks ago Long COVID   [facility] [place], Netta Neat, DO   1 month ago COPD with acute exacerbation [facility])   [facility] [place], Netta Neat, DO   4 months ago Acute non-recurrent frontal sinusitis   [facility] [place], Salvadore Oxford, NP   4 months ago Other iron deficiency anemia   [facility] Smitty Cords, DO   1 year ago Other iron deficiency anemia    [facility] [place], Netta Neat, DO              Passed - CBC within normal limits and completed in the last 12 months    WBC  Date Value Ref Range Status  12/13/2022 9.0 4.0 - 10.5 K/uL Final   RBC  Date Value Ref Range Status  12/13/2022 4.74  3.87 - 5.11 MIL/uL Final   Hemoglobin  Date Value Ref Range  Status  12/13/2022 13.0 12.0 - 15.0 g/dL Final   HGB  Date Value Ref Range Status  05/06/2014 13.2 12.0 - 16.0 g/dL Final   HCT  Date Value Ref Range Status  12/13/2022 41.1 36.0 - 46.0 % Final  05/06/2014 40.0 35.0 - 47.0 % Final   MCHC  Date Value Ref Range Status  12/13/2022 31.6 30.0 - 36.0 g/dL Final   [facility]  Date Value Ref Range Status  12/13/2022 27.4 26.0 - 34.0 pg Final   MCV  Date Value Ref Range Status  12/13/2022 86.7 80.0 - 100.0 fL Final  05/06/2014 84 80 - 100 fL Final   No results found for: "PLTCOUNTKUC", "LABPLAT", "POCPLA" RDW  Date Value Ref Range Status  12/13/2022 13.8 11.5 - 15.5 % Final  05/06/2014 13.4 11.5 - 14.5 % Final

## 2023-02-14 ENCOUNTER — Other Ambulatory Visit: Payer: Self-pay | Admitting: Family Medicine

## 2023-02-14 DIAGNOSIS — F331 Major depressive disorder, recurrent, moderate: Secondary | ICD-10-CM

## 2023-02-14 NOTE — Telephone Encounter (Signed)
Requested medications are due for refill today.  no  Requested medications are on the active medications list.  no  Last refill. 02/12/2022 #90 3 rf  Future visit scheduled.   no  Notes to clinic.  Refill/refusal not delegated.    Requested Prescriptions  Pending Prescriptions Disp Refills   ARIPiprazole (ABILIFY) 5 MG tablet [Pharmacy Med Name: ARIPiprazole 5 MG Oral Tablet] 90 tablet 0    Sig: Take 1 tablet by mouth once daily     Not Delegated - Psychiatry:  Antipsychotics - Second Generation (Atypical) - aripiprazole Failed - 02/14/2023 11:38 AM      Failed - This refill cannot be delegated      Failed - Last BP in normal range    BP Readings from Last 1 Encounters:  12/27/22 (!) 146/73         Failed - Lipid Panel in normal range within the last 12 months    Cholesterol  Date Value Ref Range Status  09/28/2021 181 <200 mg/dL Final   LDL Cholesterol (Calc)  Date Value Ref Range Status  09/28/2021 99 mg/dL (calc) Final    Comment:    Reference range: <100 . Desirable range <100 mg/dL for primary prevention;   <70 mg/dL for patients with CHD or diabetic patients  with > or = 2 CHD risk factors. Marland Kitchen LDL-C is now calculated using the Martin-Hopkins  calculation, which is a validated novel method providing  better accuracy than the Friedewald equation in the  estimation of LDL-C.  Horald Pollen et al. Lenox Ahr. 1308;657(84): 2061-2068  (http://education.[facility].com/faq/FAQ164)    HDL  Date Value Ref Range Status  09/28/2021 55 > OR = 50 mg/dL Final   Triglycerides  Date Value Ref Range Status  09/28/2021 171 (H) <150 mg/dL Final         Failed - CMP within normal limits and completed in the last 12 months    Albumin  Date Value Ref Range Status  10/18/2020 3.8 3.5 - 5.0 g/dL Final   Alkaline Phosphatase  Date Value Ref Range Status  10/18/2020 84 38 - 126 U/L Final   Alkaline phosphatase (APISO)  Date Value Ref Range Status  09/18/2022 100 37 - 153  U/L Final   ALT  Date Value Ref Range Status  09/18/2022 20 6 - 29 U/L Final   AST  Date Value Ref Range Status  09/18/2022 29 10 - 35 U/L Final   BUN  Date Value Ref Range Status  10/27/2022 10 8 - 23 mg/dL Final  69/62/9528 6 (L) 7 - 18 mg/dL Final   Calcium  Date Value Ref Range Status  10/27/2022 8.6 (L) 8.9 - 10.3 mg/dL Final   Calcium, Total  Date Value Ref Range Status  05/06/2014 8.8 8.5 - 10.1 mg/dL Final   CO2  Date Value Ref Range Status  10/27/2022 26 22 - 32 mmol/L Final   Co2  Date Value Ref Range Status  05/06/2014 27 21 - 32 mmol/L Final   Bicarbonate  Date Value Ref Range Status  06/25/2019 32.0 (H) 20.0 - 28.0 mmol/L Final   Creat  Date Value Ref Range Status  09/18/2022 0.74 0.60 - 1.00 mg/dL Final   Creatinine, Ser  Date Value Ref Range Status  10/27/2022 0.83 0.44 - 1.00 mg/dL Final   Glucose  Date Value Ref Range Status  05/06/2014 101 (H) 65 - 99 mg/dL Final   Glucose, Bld  Date Value Ref Range Status  10/27/2022 108 (H) 70 - 99  mg/dL Final    Comment:    Glucose reference range applies only to samples taken after fasting for at least 8 hours.   Glucose-Capillary  Date Value Ref Range Status  10/27/2022 132 (H) 70 - 99 mg/dL Final    Comment:    Glucose reference range applies only to samples taken after fasting for at least 8 hours.   Potassium  Date Value Ref Range Status  10/27/2022 3.6 3.5 - 5.1 mmol/L Final  05/06/2014 3.5 3.5 - 5.1 mmol/L Final   Sodium  Date Value Ref Range Status  10/27/2022 137 135 - 145 mmol/L Final  05/06/2014 140 136 - 145 mmol/L Final   Total Bilirubin  Date Value Ref Range Status  09/18/2022 0.5 0.2 - 1.2 mg/dL Final   Protein, ur  Date Value Ref Range Status  10/27/2022 NEGATIVE NEGATIVE mg/dL Final   Total Protein  Date Value Ref Range Status  09/18/2022 6.4 6.1 - 8.1 g/dL Final   EGFR (African American)  Date Value Ref Range Status  05/06/2014 >60 >68mL/min Final   GFR calc Af  Amer  Date Value Ref Range Status  10/20/2019 >60 >60 mL/min Final   eGFR  Date Value Ref Range Status  09/18/2022 85 > OR = 60 mL/min/1.18m2 Final   EGFR (Non-African Amer.)  Date Value Ref Range Status  05/06/2014 >60 >70mL/min Final    Comment:    eGFR values <47mL/min/1.73 m2 may be an indication of chronic kidney disease (CKD). Calculated eGFR, using the MRDR Study equation, is useful in  patients with stable renal function. The eGFR calculation will not be reliable in acutely ill patients when serum creatinine is changing rapidly. It is not useful in patients on dialysis. The eGFR calculation may not be applicable to patients at the low and high extremes of body sizes, pregnant women, and vegetarians.    GFR, Estimated  Date Value Ref Range Status  10/27/2022 >60 >60 mL/min Final    Comment:    (NOTE) Calculated using the CKD-EPI Creatinine Equation (2021)          Passed - TSH in normal range and within 360 days    TSH  Date Value Ref Range Status  12/13/2022 1.280 0.450 - 4.500 uIU/mL Final         Passed - Completed PHQ-2 or PHQ-9 in the last 360 days      Passed - Last Heart Rate in normal range    Pulse Readings from Last 1 Encounters:  12/27/22 95         Passed - Valid encounter within last 6 months    Recent Outpatient Visits           1 month ago Long COVID   [facility] [place], Netta Neat, DO   1 month ago COPD with acute exacerbation [facility])   [place] [facility] [place], Netta Neat, DO   4 months ago Acute non-recurrent frontal sinusitis   [place] [place], Salvadore Oxford, NP   5 months ago Other iron deficiency anemia   [facility] Smitty Cords, DO   1 year ago Other iron deficiency anemia   [facility] [place], Netta Neat, DO              Passed - CBC within normal limits and  completed in the last 12 months    WBC  Date Value Ref  Range Status  12/13/2022 9.0 4.0 - 10.5 K/uL Final   RBC  Date Value Ref Range Status  12/13/2022 4.74 3.87 - 5.11 MIL/uL Final   Hemoglobin  Date Value Ref Range Status  12/13/2022 13.0 12.0 - 15.0 g/dL Final   HGB  Date Value Ref Range Status  05/06/2014 13.2 12.0 - 16.0 g/dL Final   HCT  Date Value Ref Range Status  12/13/2022 41.1 36.0 - 46.0 % Final  05/06/2014 40.0 35.0 - 47.0 % Final   MCHC  Date Value Ref Range Status  12/13/2022 31.6 30.0 - 36.0 g/dL Final   [facility]  Date Value Ref Range Status  12/13/2022 27.4 26.0 - 34.0 pg Final   MCV  Date Value Ref Range Status  12/13/2022 86.7 80.0 - 100.0 fL Final  05/06/2014 84 80 - 100 fL Final   No results found for: "PLTCOUNTKUC", "LABPLAT", "POCPLA" RDW  Date Value Ref Range Status  12/13/2022 13.8 11.5 - 15.5 % Final  05/06/2014 13.4 11.5 - 14.5 % Final

## 2023-02-15 ENCOUNTER — Other Ambulatory Visit: Payer: Self-pay

## 2023-02-15 ENCOUNTER — Encounter: Payer: Self-pay | Admitting: Family Medicine

## 2023-02-15 MED ORDER — ARIPIPRAZOLE 5 MG PO TABS: 5.0000 mg | ORAL_TABLET | Freq: Every day | ORAL | 0 refills | Status: DC

## 2023-02-21 ENCOUNTER — Other Ambulatory Visit: Payer: Self-pay | Admitting: Family Medicine

## 2023-02-21 ENCOUNTER — Encounter: Payer: Self-pay | Admitting: Family Medicine

## 2023-02-22 NOTE — Telephone Encounter (Signed)
Requested medication (s) are due for refill today: No  Requested medication (s) are on the active medication list: Yes  Last refill:  02/15/23 #90, 0RF  Future visit scheduled: No  Notes to clinic:  Unable to refill per protocol, cannot delegate.      Requested Prescriptions  Pending Prescriptions Disp Refills   ARIPiprazole (ABILIFY) 5 MG tablet [Pharmacy Med Name: ARIPiprazole 5 MG Oral Tablet] 90 tablet 0    Sig: Take 1 tablet by mouth once daily     Not Delegated - Psychiatry:  Antipsychotics - Second Generation (Atypical) - aripiprazole Failed - 02/21/2023  1:25 PM      Failed - This refill cannot be delegated      Failed - Last BP in normal range    BP Readings from Last 1 Encounters:  12/27/22 (!) 146/73         Failed - Lipid Panel in normal range within the last 12 months    Cholesterol  Date Value Ref Range Status  09/28/2021 181 <200 mg/dL Final   LDL Cholesterol (Calc)  Date Value Ref Range Status  09/28/2021 99 mg/dL (calc) Final    Comment:    Reference range: <100 . Desirable range <100 mg/dL for primary prevention;   <70 mg/dL for patients with CHD or diabetic patients  with > or = 2 CHD risk factors. Marland Kitchen LDL-C is now calculated using the Martin-Hopkins  calculation, which is a validated novel method providing  better accuracy than the Friedewald equation in the  estimation of LDL-C.  Horald Pollen et al. Lenox Ahr. 8295;621(30): 2061-2068  (http://education.[facility].com/faq/FAQ164)    HDL  Date Value Ref Range Status  09/28/2021 55 > OR = 50 mg/dL Final   Triglycerides  Date Value Ref Range Status  09/28/2021 171 (H) <150 mg/dL Final         Failed - CMP within normal limits and completed in the last 12 months    Albumin  Date Value Ref Range Status  10/18/2020 3.8 3.5 - 5.0 g/dL Final   Alkaline Phosphatase  Date Value Ref Range Status  10/18/2020 84 38 - 126 U/L Final   Alkaline phosphatase (APISO)  Date Value Ref Range Status   09/18/2022 100 37 - 153 U/L Final   ALT  Date Value Ref Range Status  09/18/2022 20 6 - 29 U/L Final   AST  Date Value Ref Range Status  09/18/2022 29 10 - 35 U/L Final   BUN  Date Value Ref Range Status  10/27/2022 10 8 - 23 mg/dL Final  86/57/8469 6 (L) 7 - 18 mg/dL Final   Calcium  Date Value Ref Range Status  10/27/2022 8.6 (L) 8.9 - 10.3 mg/dL Final   Calcium, Total  Date Value Ref Range Status  05/06/2014 8.8 8.5 - 10.1 mg/dL Final   CO2  Date Value Ref Range Status  10/27/2022 26 22 - 32 mmol/L Final   Co2  Date Value Ref Range Status  05/06/2014 27 21 - 32 mmol/L Final   Bicarbonate  Date Value Ref Range Status  06/25/2019 32.0 (H) 20.0 - 28.0 mmol/L Final   Creat  Date Value Ref Range Status  09/18/2022 0.74 0.60 - 1.00 mg/dL Final   Creatinine, Ser  Date Value Ref Range Status  10/27/2022 0.83 0.44 - 1.00 mg/dL Final   Glucose  Date Value Ref Range Status  05/06/2014 101 (H) 65 - 99 mg/dL Final   Glucose, Bld  Date Value Ref Range Status  10/27/2022  108 (H) 70 - 99 mg/dL Final    Comment:    Glucose reference range applies only to samples taken after fasting for at least 8 hours.   Glucose-Capillary  Date Value Ref Range Status  10/27/2022 132 (H) 70 - 99 mg/dL Final    Comment:    Glucose reference range applies only to samples taken after fasting for at least 8 hours.   Potassium  Date Value Ref Range Status  10/27/2022 3.6 3.5 - 5.1 mmol/L Final  05/06/2014 3.5 3.5 - 5.1 mmol/L Final   Sodium  Date Value Ref Range Status  10/27/2022 137 135 - 145 mmol/L Final  05/06/2014 140 136 - 145 mmol/L Final   Total Bilirubin  Date Value Ref Range Status  09/18/2022 0.5 0.2 - 1.2 mg/dL Final   Protein, ur  Date Value Ref Range Status  10/27/2022 NEGATIVE NEGATIVE mg/dL Final   Total Protein  Date Value Ref Range Status  09/18/2022 6.4 6.1 - 8.1 g/dL Final   EGFR (African American)  Date Value Ref Range Status  05/06/2014 >60  >71mL/min Final   GFR calc Af Amer  Date Value Ref Range Status  10/20/2019 >60 >60 mL/min Final   eGFR  Date Value Ref Range Status  09/18/2022 85 > OR = 60 mL/min/1.15m2 Final   EGFR (Non-African Amer.)  Date Value Ref Range Status  05/06/2014 >60 >63mL/min Final    Comment:    eGFR values <32mL/min/1.73 m2 may be an indication of chronic kidney disease (CKD). Calculated eGFR, using the MRDR Study equation, is useful in  patients with stable renal function. The eGFR calculation will not be reliable in acutely ill patients when serum creatinine is changing rapidly. It is not useful in patients on dialysis. The eGFR calculation may not be applicable to patients at the low and high extremes of body sizes, pregnant women, and vegetarians.    GFR, Estimated  Date Value Ref Range Status  10/27/2022 >60 >60 mL/min Final    Comment:    (NOTE) Calculated using the CKD-EPI Creatinine Equation (2021)          Passed - TSH in normal range and within 360 days    TSH  Date Value Ref Range Status  12/13/2022 1.280 0.450 - 4.500 uIU/mL Final         Passed - Completed PHQ-2 or PHQ-9 in the last 360 days      Passed - Last Heart Rate in normal range    Pulse Readings from Last 1 Encounters:  12/27/22 95         Passed - Valid encounter within last 6 months    Recent Outpatient Visits           1 month ago Long COVID   [facility] [place], Netta Neat, DO   1 month ago COPD with acute exacerbation [facility])   [facility] [place], Netta Neat, DO   4 months ago Acute non-recurrent frontal sinusitis   [facility] [place], Salvadore Oxford, NP   5 months ago Other iron deficiency anemia   [place] [facility] Smitty Cords, DO   1 year ago Other iron deficiency anemia   [place] [facility] [place], Netta Neat, DO              Passed -  CBC within normal limits and completed in the last 12 months  WBC  Date Value Ref Range Status  12/13/2022 9.0 4.0 - 10.5 K/uL Final   RBC  Date Value Ref Range Status  12/13/2022 4.74 3.87 - 5.11 MIL/uL Final   Hemoglobin  Date Value Ref Range Status  12/13/2022 13.0 12.0 - 15.0 g/dL Final   HGB  Date Value Ref Range Status  05/06/2014 13.2 12.0 - 16.0 g/dL Final   HCT  Date Value Ref Range Status  12/13/2022 41.1 36.0 - 46.0 % Final  05/06/2014 40.0 35.0 - 47.0 % Final   MCHC  Date Value Ref Range Status  12/13/2022 31.6 30.0 - 36.0 g/dL Final   [facility]  Date Value Ref Range Status  12/13/2022 27.4 26.0 - 34.0 pg Final   MCV  Date Value Ref Range Status  12/13/2022 86.7 80.0 - 100.0 fL Final  05/06/2014 84 80 - 100 fL Final   No results found for: "PLTCOUNTKUC", "LABPLAT", "POCPLA" RDW  Date Value Ref Range Status  12/13/2022 13.8 11.5 - 15.5 % Final  05/06/2014 13.4 11.5 - 14.5 % Final

## 2023-02-23 DIAGNOSIS — R0602 Shortness of breath: Secondary | ICD-10-CM | POA: Diagnosis not present

## 2023-02-23 DIAGNOSIS — J189 Pneumonia, unspecified organism: Secondary | ICD-10-CM | POA: Diagnosis not present

## 2023-03-05 DIAGNOSIS — J410 Simple chronic bronchitis: Secondary | ICD-10-CM | POA: Diagnosis not present

## 2023-03-05 DIAGNOSIS — R0600 Dyspnea, unspecified: Secondary | ICD-10-CM | POA: Diagnosis not present

## 2023-03-10 ENCOUNTER — Other Ambulatory Visit: Payer: Self-pay | Admitting: Family Medicine

## 2023-03-10 DIAGNOSIS — K219 Gastro-esophageal reflux disease without esophagitis: Secondary | ICD-10-CM

## 2023-03-11 ENCOUNTER — Other Ambulatory Visit: Payer: Self-pay | Admitting: Family Medicine

## 2023-03-11 DIAGNOSIS — I10 Essential (primary) hypertension: Secondary | ICD-10-CM

## 2023-03-12 NOTE — Telephone Encounter (Signed)
Requested Prescriptions  Pending Prescriptions Disp Refills   omeprazole (PRILOSEC) 20 MG capsule [Pharmacy Med Name: Omeprazole 20 MG Oral Capsule Delayed Release] 90 capsule 0    Sig: TAKE 1 CAPSULE BY MOUTH ONCE DAILY BEFORE BREAKFAST     Gastroenterology: Proton Pump Inhibitors Passed - 03/10/2023  4:53 PM      Passed - Valid encounter within last 12 months    Recent Outpatient Visits           1 month ago Long COVID   [place] [facility] [place], Netta Neat, DO   2 months ago COPD with acute exacerbation [facility])   [place] [facility] Smitty Cords, DO   5 months ago Acute non-recurrent frontal sinusitis   [place] [facility] [place], Salvadore Oxford, NP   5 months ago Other iron deficiency anemia   [place] [facility] Smitty Cords, DO   1 year ago Other iron deficiency anemia   [place] [place], Netta Neat, [place]

## 2023-03-12 NOTE — Telephone Encounter (Signed)
Requested Prescriptions  Pending Prescriptions Disp Refills   potassium chloride SA (KLOR-CON M) 20 MEQ tablet [Pharmacy Med Name: Potassium Chloride Crys ER 20 MEQ Oral Tablet Extended Release] 90 tablet 0    Sig: Take 1 tablet by mouth once daily     Endocrinology:  Minerals - Potassium Supplementation Passed - 03/11/2023 10:19 AM      Passed - K in normal range and within 360 days    Potassium  Date Value Ref Range Status  10/27/2022 3.6 3.5 - 5.1 mmol/L Final  05/06/2014 3.5 3.5 - 5.1 mmol/L Final         Passed - Cr in normal range and within 360 days    Creat  Date Value Ref Range Status  09/18/2022 0.74 0.60 - 1.00 mg/dL Final   Creatinine, Ser  Date Value Ref Range Status  10/27/2022 0.83 0.44 - 1.00 mg/dL Final         Passed - Valid encounter within last 12 months    Recent Outpatient Visits           1 month ago Long COVID   [place] [facility] Smitty Cords, DO   2 months ago COPD with acute exacerbation [facility])   [facility] [place], Netta Neat, DO   5 months ago Acute non-recurrent frontal sinusitis   [facility] [place] [facility] [place], Salvadore Oxford, NP   5 months ago Other iron deficiency anemia   [place] [facility] Smitty Cords, DO   1 year ago Other iron deficiency anemia   [place] [place], Netta Neat, [place]

## 2023-03-17 ENCOUNTER — Other Ambulatory Visit: Payer: Self-pay | Admitting: Internal Medicine

## 2023-03-21 ENCOUNTER — Other Ambulatory Visit: Payer: Self-pay | Admitting: Internal Medicine

## 2023-03-21 ENCOUNTER — Encounter: Payer: Self-pay | Admitting: Family Medicine

## 2023-03-21 DIAGNOSIS — J432 Centrilobular emphysema: Secondary | ICD-10-CM

## 2023-03-21 MED ORDER — ALBUTEROL SULFATE HFA 108 (90 BASE) MCG/ACT IN AERS
2.0000 | INHALATION_SPRAY | Freq: Four times a day (QID) | RESPIRATORY_TRACT | 5 refills | Status: DC | PRN
Start: 2023-03-21 — End: 2024-02-13

## 2023-03-25 ENCOUNTER — Encounter: Payer: Self-pay | Admitting: Family Medicine

## 2023-03-25 ENCOUNTER — Ambulatory Visit: Payer: Medicare PPO | Admitting: Family Medicine

## 2023-03-25 VITALS — BP 138/84 | HR 83 | Ht 62.0 in | Wt 196.0 lb

## 2023-03-25 DIAGNOSIS — E1169 Type 2 diabetes mellitus with other specified complication: Secondary | ICD-10-CM | POA: Diagnosis not present

## 2023-03-25 DIAGNOSIS — J410 Simple chronic bronchitis: Secondary | ICD-10-CM

## 2023-03-25 DIAGNOSIS — Z23 Encounter for immunization: Secondary | ICD-10-CM | POA: Diagnosis not present

## 2023-03-25 MED ORDER — MOUNJARO 2.5 MG/0.5ML ~~LOC~~ SOAJ: 2.5000 mg | SUBCUTANEOUS | 0 refills | Status: DC

## 2023-03-25 NOTE — Patient Instructions (Addendum)
Thank you for coming to the office today.  Start Mounjaro 2.5mg  weekly for 1 month, contact us back for new order higher dose.  Keep up with Pulmonology for oxygent esting.  Some mild fluid behind R ear.  Okay to try oral decongestant OTC  Please schedule a Follow-up Appointment to: Return in about 3 months (around 06/23/2023) for 3 month DM A1c.  If you have any other questions or concerns, please feel free to call the office or send a message through MyChart. You may also schedule an earlier appointment if necessary.  Additionally, you may be receiving a survey about your experience at our office within a few days to 1 week by e-mail or mail. We value your feedback.  Saralyn Pilar, DO [facility], [place]

## 2023-03-25 NOTE — Progress Notes (Signed)
Subjective:    Patient ID: Elizabeth [name], female    DOB: 1950/12/03, 72 y.o.   MRN: [id number]  Elizabeth [name] is a 72 y.o. female presenting on 03/25/2023 for Cough (Ear ache, couple days ago this started )   HPI  Discussed the use of AI scribe software for clinical note transcription with the patient, who gave verbal consent to proceed.  History of Present Illness      The patient, with a history of COPD, presents with complaints of hearing fluid movement in the right ear and shortness of breath. She was previously diagnosed with bronchitis and was prescribed amoxicillin by a lung specialist at [facility]. The patient reports that she is now able to tolerate amoxicillin, contrary to previous experiences.  The patient also reports significant shortness of breath, describing it as gasping for air after short distances. She attributes this to her COPD and expresses concern about the impact of her weight on her breathing. The patient carries most of her weight in her stomach and believes this is affecting her respiratory function. She expresses a desire to lose weight but notes that her COPD limits her ability to exercise.  The patient also mentions a history of Type 2 Diabetes, previously with A1c at 6.8, and recent diagnosis within past 6 months, has tried and failed Metformin, now expresses interest in starting [facility]  Lastly, the patient reports some congestion and questions whether this could be affecting her balance. She has been using Flonase nasal spray and inquires about the use of over-the-counter oral decongestants.       HM Flu Vaccine today      03/25/2023    9:18 AM 09/17/2022    9:46 AM 09/13/2022    2:00 PM  Depression screen PHQ 2/9  Decreased Interest 0 3   Down, Depressed, Hopeless 0 3 1  PHQ - 2 Score 0 6 1  Altered sleeping  3 1  Tired, decreased energy  3 1  Change in appetite  2   Feeling bad or failure about yourself   0   Trouble concentrating  1   Moving  slowly or fidgety/restless  0   Suicidal thoughts  0   PHQ-9 Score  15 3  Difficult doing work/chores   Not difficult at all       03/25/2023    9:18 AM 09/17/2022    9:47 AM 10/06/2021   10:25 AM 08/04/2021   11:14 AM  GAD 7 : Generalized Anxiety Score  Nervous, Anxious, on Edge 0 3 0   Control/stop worrying 0 2 1 1   Worry too much - different things 0 1 1 1   Trouble relaxing 0 1 1 1   Restless 0 0 0 0  Easily annoyed or irritable 0 3 1 1   Afraid - awful might happen 0 0 0 0  Total GAD 7 Score 0 10 4   Anxiety Difficulty    Not difficult at all    Social History   Tobacco Use   Smoking status: Every Day    Current packs/day: 0.50    Average packs/day: 0.5 packs/day for 52.0 years (26.0 ttl pk-yrs)    Types: Cigarettes   Smokeless tobacco: Never   Tobacco comments:    5 ciggarette a day--03/02/2020  Vaping Use   Vaping status: Some Days  Substance Use Topics   Alcohol use: No   Drug use: No    Review of Systems Per HPI unless specifically indicated above  Objective:    BP 138/84 (BP Location: Left Arm, Cuff Size: Normal)   Pulse 83   Ht 5\' 2"  (1.575 m)   Wt 196 lb (88.9 kg)   SpO2 95%   BMI 35.85 kg/m   Wt Readings from Last 3 Encounters:  03/25/23 196 lb (88.9 kg)  12/27/22 196 lb 6.4 oz (89.1 kg)  12/13/22 190 lb (86.2 kg)    Physical Exam Vitals and nursing note reviewed.  Constitutional:      General: She is not in acute distress.    Appearance: She is well-developed. She is obese. She is not diaphoretic.     Comments: Well-appearing, comfortable, cooperative  HENT:     Head: Normocephalic and atraumatic.     Right Ear: Ear canal and external ear normal. There is no impacted cerumen.     Left Ear: Tympanic membrane, ear canal and external ear normal. There is no impacted cerumen.     Ears:     Comments: Right TM mild clear effusion, no cerumen and no erythema or bulging Eyes:     General:        Right eye: No discharge.        Left eye: No  discharge.     Conjunctiva/sclera: Conjunctivae normal.  Neck:     Thyroid: No thyromegaly.  Cardiovascular:     Rate and Rhythm: Normal rate and regular rhythm.     Heart sounds: Normal heart sounds. No murmur heard. Pulmonary:     Effort: Pulmonary effort is normal. No respiratory distress.     Breath sounds: Wheezing present. No rales.  Musculoskeletal:        General: Normal range of motion.     Cervical back: Normal range of motion and neck supple.  Lymphadenopathy:     Cervical: No cervical adenopathy.  Skin:    General: Skin is warm and dry.     Findings: No erythema or rash.  Neurological:     Mental Status: She is alert and oriented to person, place, and time.  Psychiatric:        Behavior: Behavior normal.     Comments: Well groomed, good eye contact, normal speech and thoughts     Results for orders placed or performed in visit on 12/13/22  Folate   Collection Time: 12/13/22 10:25 AM  Result Value Ref Range   Folate 21.4 >5.9 ng/mL  Vitamin B12   Collection Time: 12/13/22 10:25 AM  Result Value Ref Range   Vitamin B-12 580 180 - 914 pg/mL  Thyroid Panel With TSH   Collection Time: 12/13/22 10:25 AM  Result Value Ref Range   TSH 1.280 0.450 - 4.500 uIU/mL   T4, Total 9.5 4.5 - 12.0 ug/dL   T3 Uptake Ratio 22 (L) 24 - 39 %   Free Thyroxine Index 2.1 1.2 - 4.9  Ferritin   Collection Time: 12/13/22 10:25 AM  Result Value Ref Range   Ferritin 66 11 - 307 ng/mL  Iron and TIBC([facility]/Sun[facility])   Collection Time: 12/13/22 10:25 AM  Result Value Ref Range   Iron 119 28 - 170 ug/dL   TIBC 098 119 - 147 ug/dL   Saturation Ratios 34 (H) 10.4 - 31.8 %   UIBC 234 ug/dL  CBC with Differential/Platelet   Collection Time: 12/13/22 10:25 AM  Result Value Ref Range   WBC 9.0 4.0 - 10.5 K/uL   RBC 4.74 3.87 - 5.11 MIL/uL   Hemoglobin 13.0 12.0 - 15.0 g/dL  HCT 41.1 36.0 - 46.0 %   MCV 86.7 80.0 - 100.0 fL   MCH 27.4 26.0 - 34.0 pg   MCHC 31.6 30.0 - 36.0 g/dL    RDW 21.3 08.6 - 57.8 %   Platelets 287 150 - 400 K/uL   nRBC 0.0 0.0 - 0.2 %   Neutrophils Relative % 63 %   Neutro Abs 5.7 1.7 - 7.7 K/uL   Lymphocytes Relative 27 %   Lymphs Abs 2.5 0.7 - 4.0 K/uL   Monocytes Relative 8 %   Monocytes Absolute 0.8 0.1 - 1.0 K/uL   Eosinophils Relative 1 %   Eosinophils Absolute 0.1 0.0 - 0.5 K/uL   Basophils Relative 0 %   Basophils Absolute 0.0 0.0 - 0.1 K/uL   Immature Granulocytes 1 %   Abs Immature Granulocytes 0.09 (H) 0.00 - 0.07 K/uL      Assessment & Plan:   Problem List Items Addressed This Visit     Type 2 diabetes mellitus with other specified complication (HCC)   Relevant Medications   MOUNJARO 2.5 MG/0.5ML Pen   Other Visit Diagnoses       Simple chronic bronchitis (HCC)    -  Primary     Needs flu shot       Relevant Orders   Flu Vaccine Trivalent High Dose (Fluad) (Completed)        COPD Shortness of breath and wheezing noted on exam. Patient has been seen by a pulmonologist at [facility] and was prescribed Amoxicillin. Patient reports difficulty with exercise due to breathlessness. -Continue current management plan as directed by pulmonologist. -Consider further evaluation for oxygen therapy as discussed with pulmonologist. They were unable to complete 6 minute walk test for oxygen certification. We discussed that she has chronic dyspnea, and that at this point she is on maximum inhaler therapy at this time, and next steps needed would be from Pulmonologist.  Fluid in Right Ear Patient reports hearing fluid move around in right ear. Fluid behind the eardrum noted on exam, potentially affecting balance. -Continue Flonase nasal spray. -Consider over-the-counter oral decongestant.  Type 2 Diabetes A1c 6.8 previously Failed Metformin -Start Mounjaro 2.5mg  weekly for one month. Notify us for monthly dose adjust  General Health Maintenance -Administer influenza vaccine today. -Patient to receive COVID-19 vaccine at pharmacy at  her discretion. -Routine lab work to be done annually, last done in June. No additional labs needed at this time. -Schedule follow-up appointment in three months.      ____________________________________________________ Additional Rx Information (May be used for Prior Authorization if required)  Medication name and Strength: Mounjaro 2.5mg   Primary Diagnosis and ICD10 Code: Type 2 Diabetes (E11.69) Secondary Diagnosis and ICD10 Code: n/a  Previous Failed Medications Metformin IR 500mg  twice a day Quantity and Duration of New Medication: 2 mL for 28 day supply Additional Supporting Information: N/a ____________________________________________________     Orders Placed This Encounter  Procedures   Flu Vaccine Trivalent High Dose (Fluad)    Meds ordered this encounter  Medications   MOUNJARO 2.5 MG/0.5ML Pen    Sig: Inject 2.5 mg into the skin once a week.    Dispense:  2 mL    Refill:  0    Follow up plan: Return in about 3 months (around 06/23/2023) for 3 month DM A1c, COPD.   Saralyn Pilar, DO [facility] 03/25/2023, 9:35 AM

## 2023-04-08 ENCOUNTER — Telehealth: Payer: Self-pay

## 2023-04-08 NOTE — Telephone Encounter (Signed)
 Prior Auth for Rexulti

## 2023-04-18 ENCOUNTER — Encounter: Payer: Self-pay | Admitting: Family Medicine

## 2023-04-18 ENCOUNTER — Telehealth (INDEPENDENT_AMBULATORY_CARE_PROVIDER_SITE_OTHER): Payer: Medicare PPO | Admitting: Family Medicine

## 2023-04-18 DIAGNOSIS — J410 Simple chronic bronchitis: Secondary | ICD-10-CM | POA: Diagnosis not present

## 2023-04-18 DIAGNOSIS — J0110 Acute frontal sinusitis, unspecified: Secondary | ICD-10-CM | POA: Diagnosis not present

## 2023-04-18 DIAGNOSIS — B9789 Other viral agents as the cause of diseases classified elsewhere: Secondary | ICD-10-CM | POA: Diagnosis not present

## 2023-04-18 DIAGNOSIS — F17210 Nicotine dependence, cigarettes, uncomplicated: Secondary | ICD-10-CM

## 2023-04-18 MED ORDER — AMOXICILLIN-POT CLAVULANATE 875-125 MG PO TABS: 1.0000 | ORAL_TABLET | Freq: Two times a day (BID) | ORAL | 0 refills | Status: DC

## 2023-04-18 NOTE — Progress Notes (Signed)
Subjective:    Patient ID: Elizabeth [name], female    DOB: Aug 22, 1950, 73 y.o.   MRN: [id number]  Elizabeth [name] is a 73 y.o. female presenting on 04/18/2023 for Sinusitis   Virtual / Telehealth Encounter - Video Visit via MyChart The purpose of this virtual visit is to provide medical care while limiting exposure to the novel coronavirus (COVID19) for both patient and office staff.  Consent was obtained for remote visit:  Yes.   Answered questions that patient had about telehealth interaction:  Yes.   I discussed the limitations, risks, security and privacy concerns of performing an evaluation and management service by video/telephone. I also discussed with the patient that there may be a patient responsible charge related to this service. The patient expressed understanding and agreed to proceed.  Patient Location: Home Provider Location: [facility] (Office)  Participants in virtual visit: - Patient: Serai Hoyland - CMA: Fuller Plan CMA - Provider: Dr Althea Charon   HPI  Discussed the use of AI scribe software for clinical note transcription with the patient, who gave verbal consent to proceed.  History of Present Illness    Sinusitis / COPD Flare  The patient, with a history of chronic sinusitis and COPD, presents with symptoms suggestive of a sinus infection that has been ongoing for about a week. The symptoms have worsened since the recent snowfall. She reports headaches, mild fever not exceeding 100 degrees, and occasional nosebleeds, which she associates with severe sinus infections. She also experienced an episode of dizziness one night that almost led to a fall. Over-the-counter decongestants have not provided relief.  In addition to the sinus symptoms, the patient has been experiencing a tight, wheezing cough, which she attributes to her underlying COPD. She has been managing her respiratory symptoms with inhalers, a nebulizer, and nighttime oxygen.  She had a similar respiratory episode in December, for which she was prescribed Amoxicillin-Clavulanate by a pulmonologist, and found it to be effective. She does not feel the need for steroids at this point.         03/25/2023    9:18 AM 09/17/2022    9:46 AM 09/13/2022    2:00 PM  Depression screen PHQ 2/9  Decreased Interest 0 3   Down, Depressed, Hopeless 0 3 1  PHQ - 2 Score 0 6 1  Altered sleeping  3 1  Tired, decreased energy  3 1  Change in appetite  2   Feeling bad or failure about yourself   0   Trouble concentrating  1   Moving slowly or fidgety/restless  0   Suicidal thoughts  0   PHQ-9 Score  15 3  Difficult doing work/chores   Not difficult at all       03/25/2023    9:18 AM 09/17/2022    9:47 AM 10/06/2021   10:25 AM 08/04/2021   11:14 AM  GAD 7 : Generalized Anxiety Score  Nervous, Anxious, on Edge 0 3 0   Control/stop worrying 0 2 1 1   Worry too much - different things 0 1 1 1   Trouble relaxing 0 1 1 1   Restless 0 0 0 0  Easily annoyed or irritable 0 3 1 1   Afraid - awful might happen 0 0 0 0  Total GAD 7 Score 0 10 4   Anxiety Difficulty    Not difficult at all    Social History   Tobacco Use   Smoking status: Every Day  Current packs/day: 0.50    Average packs/day: 0.5 packs/day for 52.0 years (26.0 ttl pk-yrs)    Types: Cigarettes   Smokeless tobacco: Never   Tobacco comments:    5 ciggarette a day--03/02/2020  Vaping Use   Vaping status: Some Days  Substance Use Topics   Alcohol use: No   Drug use: No    Review of Systems Per HPI unless specifically indicated above     Objective:    There were no vitals taken for this visit.  Wt Readings from Last 3 Encounters:  03/25/23 196 lb (88.9 kg)  12/27/22 196 lb 6.4 oz (89.1 kg)  12/13/22 190 lb (86.2 kg)     Physical Exam  Note examination was completely remotely via video observation objective data only  Gen - well-appearing, no acute distress or apparent pain, comfortable HEENT -  eyes appear clear without discharge or redness Heart/Lungs - cannot examine virtually - observed evidence of coughing and some wheezing Abd - cannot examine virtually  Skin - face visible today- no rash Neuro - awake, alert, oriented Psych - not anxious appearing   Results for orders placed or performed in visit on 12/13/22  Folate   Collection Time: 12/13/22 10:25 AM  Result Value Ref Range   Folate 21.4 >5.9 ng/mL  Vitamin B12   Collection Time: 12/13/22 10:25 AM  Result Value Ref Range   Vitamin B-12 580 180 - 914 pg/mL  Thyroid Panel With TSH   Collection Time: 12/13/22 10:25 AM  Result Value Ref Range   TSH 1.280 0.450 - 4.500 uIU/mL   T4, Total 9.5 4.5 - 12.0 ug/dL   T3 Uptake Ratio 22 (L) 24 - 39 %   Free Thyroxine Index 2.1 1.2 - 4.9  Ferritin   Collection Time: 12/13/22 10:25 AM  Result Value Ref Range   Ferritin 66 11 - 307 ng/mL  Iron and TIBC([facility])   Collection Time: 12/13/22 10:25 AM  Result Value Ref Range   Iron 119 28 - 170 ug/dL   TIBC 440 347 - 425 ug/dL   Saturation Ratios 34 (H) 10.4 - 31.8 %   UIBC 234 ug/dL  CBC with Differential/Platelet   Collection Time: 12/13/22 10:25 AM  Result Value Ref Range   WBC 9.0 4.0 - 10.5 K/uL   RBC 4.74 3.87 - 5.11 MIL/uL   Hemoglobin 13.0 12.0 - 15.0 g/dL   HCT 95.6 38.7 - 56.4 %   MCV 86.7 80.0 - 100.0 fL   MCH 27.4 26.0 - 34.0 pg   MCHC 31.6 30.0 - 36.0 g/dL   RDW 33.2 95.1 - 88.4 %   Platelets 287 150 - 400 K/uL   nRBC 0.0 0.0 - 0.2 %   Neutrophils Relative % 63 %   Neutro Abs 5.7 1.7 - 7.7 K/uL   Lymphocytes Relative 27 %   Lymphs Abs 2.5 0.7 - 4.0 K/uL   Monocytes Relative 8 %   Monocytes Absolute 0.8 0.1 - 1.0 K/uL   Eosinophils Relative 1 %   Eosinophils Absolute 0.1 0.0 - 0.5 K/uL   Basophils Relative 0 %   Basophils Absolute 0.0 0.0 - 0.1 K/uL   Immature Granulocytes 1 %   Abs Immature Granulocytes 0.09 (H) 0.00 - 0.07 K/uL      Assessment & Plan:   Problem List Items Addressed  This Visit   None Visit Diagnoses       Acute non-recurrent frontal sinusitis    -  Primary   Relevant Medications  amoxicillin-clavulanate (AUGMENTIN) 875-125 MG tablet     Simple chronic bronchitis (HCC)             Sinusitis Symptoms of sinus infection for about a week, exacerbated by recent weather changes. Associated with headaches, slight fever, and dizziness. Over-the-counter decongestants and aspirin have not been effective. -Order Amoxicillin-Clavulanate (previously effective for similar symptoms).  Chronic Obstructive Pulmonary Disease (COPD) Followed by Pulm Exacerbation of symptoms with current illness, including increased coughing. Patient has inhalers, nebulizer, and oxygen for use at home. -Continue current management with inhalers, nebulizer, and oxygen. -Avoid steroids at this time due to potential side effects and patient's current ability to manage symptoms.  Follow-up as needed if symptoms do not improve or worsen.         No orders of the defined types were placed in this encounter.   Meds ordered this encounter  Medications   amoxicillin-clavulanate (AUGMENTIN) 875-125 MG tablet    Sig: Take 1 tablet by mouth 2 (two) times daily.    Dispense:  20 tablet    Refill:  0    Follow up plan: Return if symptoms worsen or fail to improve.   Patient verbalizes understanding with the above medical recommendations including the limitation of remote medical advice.  Specific follow-up and call-back criteria were given for patient to follow-up or seek medical care more urgently if needed.  Total duration of direct patient care provided via video conference: 6 minutes   Saralyn Pilar, DO [facility] 04/18/2023, 11:48 AM

## 2023-04-18 NOTE — Patient Instructions (Addendum)
Thank you for coming to the office today.    Please schedule a Follow-up Appointment to: Return if symptoms worsen or fail to improve.  If you have any other questions or concerns, please feel free to call the office or send a message through [place]. You may also schedule an earlier appointment if necessary.  Additionally, you may be receiving a survey about your experience at our office within a few days to 1 week by e-mail or mail. We value your feedback.  Nobie Putnam, DO [place]

## 2023-04-21 ENCOUNTER — Telehealth: Payer: Self-pay | Admitting: Family Medicine

## 2023-04-21 DIAGNOSIS — E1169 Type 2 diabetes mellitus with other specified complication: Secondary | ICD-10-CM

## 2023-04-22 ENCOUNTER — Other Ambulatory Visit: Payer: Medicare PPO

## 2023-04-22 MED ORDER — MOUNJARO 2.5 MG/0.5ML ~~LOC~~ SOAJ: 2.5000 mg | SUBCUTANEOUS | 5 refills | Status: DC

## 2023-04-22 NOTE — Telephone Encounter (Signed)
Requested medication (s) are due for refill today: routing for review  Requested medication (s) are on the active medication list: yes  Last refill:    Future visit scheduled: yes  Notes to clinic:  Medication not assigned to a protocol, review manually      Requested Prescriptions  Pending Prescriptions Disp Refills   MOUNJARO 2.5 MG/0.5ML Pen [Pharmacy Med Name: Mounjaro 2.5 MG/0.5ML Subcutaneous Solution Pen-injector] 4 mL 0    Sig: INJECT 2.5 MG SUBCUTANEOUSLY  ONCE A WEEK     Off-Protocol Failed - 04/22/2023  9:52 AM      Failed - Medication not assigned to a protocol, review manually.      Passed - Valid encounter within last 12 months    Recent Outpatient Visits           4 days ago Acute non-recurrent frontal sinusitis   [place] [facility] [place], Netta Neat, DO   4 weeks ago Simple chronic bronchitis [facility])   [place] [facility] Althea Charon, Netta Neat, DO   3 months ago Long COVID   [place] [facility] Smitty Cords, DO   3 months ago COPD with acute exacerbation [facility])   [place] [facility] Smitty Cords, DO   6 months ago Acute non-recurrent frontal sinusitis   [place] [facility] [place], Salvadore Oxford, NP       Future Appointments             In 2 months Althea Charon, Netta Neat, DO [place] [facility] [place]

## 2023-04-22 NOTE — Telephone Encounter (Signed)
Please call patient to confirm what dose Mounjaro she is interested to refill.  Received refill request for [name] 2.5mg , but usually we recommend dose adjusting to 5mg  as next dose.  Saralyn Pilar, DO [facility] 04/22/2023, 12:06 PM

## 2023-04-23 ENCOUNTER — Telehealth: Payer: Self-pay

## 2023-04-23 NOTE — Telephone Encounter (Signed)
She is no longer on Rexulti. It was ordered back in June 2024. We switched her back bc it was not covered.  Saralyn Pilar, DO [facility] 04/23/2023, 4:09 PM

## 2023-04-23 NOTE — Progress Notes (Addendum)
PA started   Elizabeth [name] (Key: [id number]) Rexulti 1MG  tablets Form [employer] Electronic PA Form    Patient is no longer on rexulti.

## 2023-04-30 ENCOUNTER — Encounter: Payer: Self-pay | Admitting: Family Medicine

## 2023-04-30 ENCOUNTER — Telehealth (INDEPENDENT_AMBULATORY_CARE_PROVIDER_SITE_OTHER): Payer: Medicare PPO | Admitting: Family Medicine

## 2023-04-30 DIAGNOSIS — R112 Nausea with vomiting, unspecified: Secondary | ICD-10-CM | POA: Diagnosis not present

## 2023-04-30 DIAGNOSIS — A0811 Acute gastroenteropathy due to Norwalk agent: Secondary | ICD-10-CM

## 2023-04-30 MED ORDER — ONDANSETRON 4 MG PO TBDP: 4.0000 mg | ORAL_TABLET | Freq: Every day | ORAL | 0 refills | Status: DC | PRN

## 2023-04-30 NOTE — Progress Notes (Signed)
Subjective:    Patient ID: Elizabeth [name], female    DOB: 1950-06-10, 73 y.o.   MRN: [id number]  Elizabeth [name] is a 73 y.o. female presenting on 04/30/2023 for Norovirus, nausea vomiting   Virtual / Telehealth Encounter - Video Visit via MyChart The purpose of this virtual visit is to provide medical care while limiting exposure to the novel coronavirus (COVID19) for both patient and office staff.  Consent was obtained for remote visit:  Yes.   Answered questions that patient had about telehealth interaction:  Yes.   I discussed the limitations, risks, security and privacy concerns of performing an evaluation and management service by video/telephone. I also discussed with the patient that there may be a patient responsible charge related to this service. The patient expressed understanding and agreed to proceed.  Patient Location: Home Provider Location: [facility] (Office)  Participants in virtual visit: - Patient: Elizabeth [name] - CMA: Fuller Plan CMA - Provider: Dr Althea Charon   HPI  Discussed the use of AI scribe software for clinical note transcription with the patient, who gave verbal consent to proceed.  History of Present Illness    Norovirus  New onset infection recently due to Norovirus. Family members are sick as well with same.  She has been experiencing fever, nausea, vomiting, and diarrhea. The fever fluctuates, reaching up to 101-102F at night, and she describes feeling 'horrible' due to these symptoms.  She is managing symptoms with over-the-counter Tylenol and has taken Zofran, provided by a family member, which has helped reduce vomiting. She has a history of prolonged QT interval, which poses a risk when taking Zofran.  For rehydration, she is consuming a mix of low-calorie Gatorade and water to maintain electrolyte balance.             03/25/2023    9:18 AM 09/17/2022    9:46 AM 09/13/2022    2:00 PM  Depression screen  PHQ 2/9  Decreased Interest 0 3   Down, Depressed, Hopeless 0 3 1  PHQ - 2 Score 0 6 1  Altered sleeping  3 1  Tired, decreased energy  3 1  Change in appetite  2   Feeling bad or failure about yourself   0   Trouble concentrating  1   Moving slowly or fidgety/restless  0   Suicidal thoughts  0   PHQ-9 Score  15 3  Difficult doing work/chores   Not difficult at all       03/25/2023    9:18 AM 09/17/2022    9:47 AM 10/06/2021   10:25 AM 08/04/2021   11:14 AM  GAD 7 : Generalized Anxiety Score  Nervous, Anxious, on Edge 0 3 0   Control/stop worrying 0 2 1 1   Worry too much - different things 0 1 1 1   Trouble relaxing 0 1 1 1   Restless 0 0 0 0  Easily annoyed or irritable 0 3 1 1   Afraid - awful might happen 0 0 0 0  Total GAD 7 Score 0 10 4   Anxiety Difficulty    Not difficult at all    Social History   Tobacco Use   Smoking status: Every Day    Current packs/day: 0.50    Average packs/day: 0.5 packs/day for 52.0 years (26.0 ttl pk-yrs)    Types: Cigarettes   Smokeless tobacco: Never   Tobacco comments:    5 ciggarette a day--03/02/2020  Vaping Use   Vaping status: Some  Days  Substance Use Topics   Alcohol use: No   Drug use: No    Review of Systems Per HPI unless specifically indicated above     Objective:    There were no vitals taken for this visit.  Wt Readings from Last 3 Encounters:  03/25/23 196 lb (88.9 kg)  12/27/22 196 lb 6.4 oz (89.1 kg)  12/13/22 190 lb (86.2 kg)     Physical Exam  Note examination was completely remotely via video observation objective data only  Gen - well-appearing, no acute distress or apparent pain, comfortable HEENT - eyes appear clear without discharge or redness Heart/Lungs - cannot examine virtually - observed no evidence of coughing or labored breathing. Abd - cannot examine virtually  Skin - face visible today- no rash Neuro - awake, alert, oriented Psych - not anxious appearing   Results for orders placed or  performed in visit on 12/13/22  Folate   Collection Time: 12/13/22 10:25 AM  Result Value Ref Range   Folate 21.4 >5.9 ng/mL  Vitamin B12   Collection Time: 12/13/22 10:25 AM  Result Value Ref Range   Vitamin B-12 580 180 - 914 pg/mL  Thyroid Panel With TSH   Collection Time: 12/13/22 10:25 AM  Result Value Ref Range   TSH 1.280 0.450 - 4.500 uIU/mL   T4, Total 9.5 4.5 - 12.0 ug/dL   T3 Uptake Ratio 22 (L) 24 - 39 %   Free Thyroxine Index 2.1 1.2 - 4.9  Ferritin   Collection Time: 12/13/22 10:25 AM  Result Value Ref Range   Ferritin 66 11 - 307 ng/mL  Iron and TIBC([facility])   Collection Time: 12/13/22 10:25 AM  Result Value Ref Range   Iron 119 28 - 170 ug/dL   TIBC 725 366 - 440 ug/dL   Saturation Ratios 34 (H) 10.4 - 31.8 %   UIBC 234 ug/dL  CBC with Differential/Platelet   Collection Time: 12/13/22 10:25 AM  Result Value Ref Range   WBC 9.0 4.0 - 10.5 K/uL   RBC 4.74 3.87 - 5.11 MIL/uL   Hemoglobin 13.0 12.0 - 15.0 g/dL   HCT 34.7 42.5 - 95.6 %   MCV 86.7 80.0 - 100.0 fL   MCH 27.4 26.0 - 34.0 pg   MCHC 31.6 30.0 - 36.0 g/dL   RDW 38.7 56.4 - 33.2 %   Platelets 287 150 - 400 K/uL   nRBC 0.0 0.0 - 0.2 %   Neutrophils Relative % 63 %   Neutro Abs 5.7 1.7 - 7.7 K/uL   Lymphocytes Relative 27 %   Lymphs Abs 2.5 0.7 - 4.0 K/uL   Monocytes Relative 8 %   Monocytes Absolute 0.8 0.1 - 1.0 K/uL   Eosinophils Relative 1 %   Eosinophils Absolute 0.1 0.0 - 0.5 K/uL   Basophils Relative 0 %   Basophils Absolute 0.0 0.0 - 0.1 K/uL   Immature Granulocytes 1 %   Abs Immature Granulocytes 0.09 (H) 0.00 - 0.07 K/uL      Assessment & Plan:   Problem List Items Addressed This Visit   None Visit Diagnoses       Norovirus    -  Primary     Nausea and vomiting, unspecified vomiting type       Relevant Medications   ondansetron (ZOFRAN-ODT) 4 MG disintegrating tablet         Norovirus infection Fever, nausea, vomiting, and diarrhea.   Discussed the risks of  Zofran with prolonged QTc  but patient willing to take the risk for symptomatic relief. -Start Zofran 4mg  once daily as needed for nausea, with caution due to prolonged QTc. Do NOT take more than once per day, use sparingly with caution.  -Start Imodium as needed for diarrhea, with instructions for use provided. -Continue rehydration with water and low calorie Gatorade. -Consider peppermint oil for natural relief of diarrhea. -Continue hand hygiene and precautions to prevent reinfection. -Expect symptoms to last 7-10 days based on typical course of norovirus.  No other medication refills or orders needed at this time. Follow up as needed for further questions or concerns.         No orders of the defined types were placed in this encounter.   Meds ordered this encounter  Medications   ondansetron (ZOFRAN-ODT) 4 MG disintegrating tablet    Sig: Take 1 tablet (4 mg total) by mouth daily as needed for nausea or vomiting.    Dispense:  20 tablet    Refill:  0    Follow up plan: Return if symptoms worsen or fail to improve.   Patient verbalizes understanding with the above medical recommendations including the limitation of remote medical advice.  Specific follow-up and call-back criteria were given for patient to follow-up or seek medical care more urgently if needed.  Total duration of direct patient care provided via video conference: 10 minutes   Saralyn Pilar, DO [facility] 04/30/2023, 4:37 PM

## 2023-04-30 NOTE — Patient Instructions (Addendum)
Thank you for coming to the office today.  Keep on Loperamide 2 doses at once first dose of day, then up to 6 more doses through the rest of the 24 hours. Every 4 hour or sooner or later.   Zofran ODT dissolving tab - be very cautious with side effect on heart rhythm, should only take MAX of ONE DOSE per day.  ------------------------------   Herbal IBgard OTC Peppermint Oil (Triple Coated Capsule) 180mg  take one 3 times daily to reduce diarrhea  Norovirus Infection Norovirus infection causes inflammation in the stomach and intestines (gastroenteritis) and food poisoning. It is caused by exposure to a virus from a group of similar viruses called noroviruses. Norovirus spreads very easily from person to person (is very contagious). It often occurs in places where people are in close contact, such as schools, nursing homes, restaurants, and cruise ships. You can get it from food, water, surfaces, or other people who have the virus. Norovirus is also found in the stool (feces) or vomit of infected people. You can spread the infection as soon as you feel sick, and you may continue to be contagious after you recover. What are the causes? This condition is caused by contact with norovirus. You can catch norovirus if you: Eat or drink something that is contaminated with norovirus. Touch surfaces or objects that are contaminated with norovirus and then put your hand in or by your mouth or nose. Have direct contact with an infected person who may or may not still have symptoms. Share food, drink, or utensils with someone who is contagious with norovirus. What are the signs or symptoms? Symptoms usually begin within 12 hours to 2 days after you become infected. Most norovirus symptoms affect the digestive system.Symptoms may include: Nausea, vomiting, and diarrhea. Stomach cramps. Fever. Chills. Headache. Muscle aches and tiredness. How is this diagnosed? This condition may be diagnosed based  on: Your symptoms. A physical exam. A stool test. How is this treated? There is no specific treatment for norovirus. Most people get better without treatment in about 2 days. Young children, the elderly, and people who are already sick may take up to 6 days to recover. Follow these instructions at home:  Eating and drinking  Drink plenty of water to replace fluids that are lost through diarrhea and vomiting. This prevents dehydration. Drink enough fluid to keep your urine pale yellow. Drink clear fluids in small amounts as you are able. Clear fluids include water, ice chips, fruit juice with water added (diluted fruit juice), and low-calorie sports drinks. Avoid fluids that contain a lot of sugar or caffeine, such as energy drinks, sports drinks, and soda. Avoid alcohol. If instructed by your health care provider, drink an oral rehydration solution (ORS). This is a drink that is sold at pharmacies and retail stores. An ORS contains minerals (electrolytes) that you can lose through diarrhea and vomiting. Eat bland, easy-to-digest foods in small amounts as you are able. These foods include rice, lean meats, toast, and crackers. Avoid spicy or fatty foods. General instructions Rest at home while you recover. Do not prepare food for others while you are infected. Wait at least 3 days after you recover from the illness to do this. Take over-the-counter and prescription medicines only as told by your health care provider. Wash your hands frequently with soap and water for at least 20 seconds. Alcohol-based hand sanitizer can be used in addition to soap and water, but sanitizer should not be the only cleansing method because  it is not effective at removing norovirus from your hands or surfaces. Make sure that each person in your household washes his or her hands well and often. Keep all follow-up visits. This is important. How is this prevented? To help prevent the spread of norovirus: Stay at  home if you are feeling sick. This will reduce the risk of spreading the virus to others. Wash your hands often with soap and water for at least 20 seconds, especially after using the toilet, helping a child use the toilet, or changing a child's diaper. Wash fruits and vegetables thoroughly before peeling, preparing, or serving them. Throw out any food that a sick person may have touched. Disinfect contaminated surfaces immediately after someone in the household has been sick. Disinfect frequently used surfaces, such as counters, doorknobs, and faucets. Use a bleach-based household cleaner. Immediately remove and wash soiled clothes or sheets. Contact a health care provider if: You have vomiting, diarrhea, or stomach pain that gets worse. You have symptoms that do not go away after 3-6 days. You have a fever. You cannot drink without vomiting. You feel light-headed or dizzy. Your symptoms get worse. Get help right away if: You develop symptoms of dehydration that do not improve with fluid replacement, such as: Excessive sleepiness. Lack of tears. Very little urine production. Dry mouth. Muscle cramps. Weak pulse. Confusion. Summary Norovirus infection is common and often occurs in places where people are in close contact, such as schools, nursing homes, restaurants, and cruise ships. To help prevent the spread of this infection, wash hands with soap and water for at least 20 seconds before handling food or after having contact with stool or body fluids. There is no specific treatment for norovirus, but most people get better without treatment in about 2 days. People who are healthy when infected often recover sooner than those who are elderly, young, or already sick. Replace lost fluids by drinking plenty of water, or by drinking oral rehydration solution (ORS), which contains important minerals called electrolytes. This prevents dehydration. This information is not intended to replace  advice given to you by your health care provider. Make sure you discuss any questions you have with your health care provider. Document Revised: 10/26/2020 Document Reviewed: 10/26/2020 Elsevier Patient Education  2024 Elsevier Inc.  Please schedule a Follow-up Appointment to: No follow-ups on file.  If you have any other questions or concerns, please feel free to call the office or send a message through MyChart. You may also schedule an earlier appointment if necessary.  Additionally, you may be receiving a survey about your experience at our office within a few days to 1 week by e-mail or mail. We value your feedback.  Saralyn Pilar, DO [facility], [place]

## 2023-05-01 ENCOUNTER — Ambulatory Visit: Payer: Medicare PPO | Admitting: Oncology

## 2023-05-13 ENCOUNTER — Other Ambulatory Visit: Payer: Self-pay | Admitting: Internal Medicine

## 2023-05-13 DIAGNOSIS — F331 Major depressive disorder, recurrent, moderate: Secondary | ICD-10-CM

## 2023-05-14 NOTE — Telephone Encounter (Signed)
Requested medication (s) are due for refill today: Yes  Requested medication (s) are on the active medication list: Yes  Last refill:  02/15/23  Future visit scheduled: Yes  Notes to clinic:  Unable to refill per protocol, cannot delegate.      Requested Prescriptions  Pending Prescriptions Disp Refills   ARIPiprazole (ABILIFY) 5 MG tablet [Pharmacy Med Name: ARIPiprazole 5 MG Oral Tablet] 90 tablet 0    Sig: Take 1 tablet by mouth once daily     Not Delegated - Psychiatry:  Antipsychotics - Second Generation (Atypical) - aripiprazole Failed - 05/14/2023 12:50 PM      Failed - This refill cannot be delegated      Failed - Lipid Panel in normal range within the last 12 months    Cholesterol  Date Value Ref Range Status  09/28/2021 181 <200 mg/dL Final   LDL Cholesterol (Calc)  Date Value Ref Range Status  09/28/2021 99 mg/dL (calc) Final    Comment:    Reference range: <100 . Desirable range <100 mg/dL for primary prevention;   <70 mg/dL for patients with CHD or diabetic patients  with > or = 2 CHD risk factors. Marland Kitchen LDL-C is now calculated using the Martin-Hopkins  calculation, which is a validated novel method providing  better accuracy than the Friedewald equation in the  estimation of LDL-C.  Horald Pollen et al. Lenox Ahr. 1610;960(45): 2061-2068  (http://education.[employer].com/faq/FAQ164)    HDL  Date Value Ref Range Status  09/28/2021 55 > OR = 50 mg/dL Final   Triglycerides  Date Value Ref Range Status  09/28/2021 171 (H) <150 mg/dL Final         Failed - CMP within normal limits and completed in the last 12 months    Albumin  Date Value Ref Range Status  10/18/2020 3.8 3.5 - 5.0 g/dL Final   Alkaline Phosphatase  Date Value Ref Range Status  10/18/2020 84 38 - 126 U/L Final   Alkaline phosphatase (APISO)  Date Value Ref Range Status  09/18/2022 100 37 - 153 U/L Final   ALT  Date Value Ref Range Status  09/18/2022 20 6 - 29 U/L Final   AST   Date Value Ref Range Status  09/18/2022 29 10 - 35 U/L Final   BUN  Date Value Ref Range Status  10/27/2022 10 8 - 23 mg/dL Final  40/98/1191 6 (L) 7 - 18 mg/dL Final   Calcium  Date Value Ref Range Status  10/27/2022 8.6 (L) 8.9 - 10.3 mg/dL Final   Calcium, Total  Date Value Ref Range Status  05/06/2014 8.8 8.5 - 10.1 mg/dL Final   CO2  Date Value Ref Range Status  10/27/2022 26 22 - 32 mmol/L Final   Co2  Date Value Ref Range Status  05/06/2014 27 21 - 32 mmol/L Final   Bicarbonate  Date Value Ref Range Status  06/25/2019 32.0 (H) 20.0 - 28.0 mmol/L Final   Creat  Date Value Ref Range Status  09/18/2022 0.74 0.60 - 1.00 mg/dL Final   Creatinine, Ser  Date Value Ref Range Status  10/27/2022 0.83 0.44 - 1.00 mg/dL Final   Glucose  Date Value Ref Range Status  05/06/2014 101 (H) 65 - 99 mg/dL Final   Glucose, Bld  Date Value Ref Range Status  10/27/2022 108 (H) 70 - 99 mg/dL Final    Comment:    Glucose reference range applies only to samples taken after fasting for at least 8 hours.  Glucose-Capillary  Date Value Ref Range Status  10/27/2022 132 (H) 70 - 99 mg/dL Final    Comment:    Glucose reference range applies only to samples taken after fasting for at least 8 hours.   Potassium  Date Value Ref Range Status  10/27/2022 3.6 3.5 - 5.1 mmol/L Final  05/06/2014 3.5 3.5 - 5.1 mmol/L Final   Sodium  Date Value Ref Range Status  10/27/2022 137 135 - 145 mmol/L Final  05/06/2014 140 136 - 145 mmol/L Final   Total Bilirubin  Date Value Ref Range Status  09/18/2022 0.5 0.2 - 1.2 mg/dL Final   Protein, ur  Date Value Ref Range Status  10/27/2022 NEGATIVE NEGATIVE mg/dL Final   Total Protein  Date Value Ref Range Status  09/18/2022 6.4 6.1 - 8.1 g/dL Final   EGFR (African American)  Date Value Ref Range Status  05/06/2014 >60 >100mL/min Final   GFR calc Af Amer  Date Value Ref Range Status  10/20/2019 >60 >60 mL/min Final   eGFR  Date  Value Ref Range Status  09/18/2022 85 > OR = 60 mL/min/1.72m2 Final   EGFR (Non-African Amer.)  Date Value Ref Range Status  05/06/2014 >60 >37mL/min Final    Comment:    eGFR values <54mL/min/1.73 m2 may be an indication of chronic kidney disease (CKD). Calculated eGFR, using the MRDR Study equation, is useful in  patients with stable renal function. The eGFR calculation will not be reliable in acutely ill patients when serum creatinine is changing rapidly. It is not useful in patients on dialysis. The eGFR calculation may not be applicable to patients at the low and high extremes of body sizes, pregnant women, and vegetarians.    GFR, Estimated  Date Value Ref Range Status  10/27/2022 >60 >60 mL/min Final    Comment:    (NOTE) Calculated using the CKD-EPI Creatinine Equation (2021)          Passed - TSH in normal range and within 360 days    TSH  Date Value Ref Range Status  12/13/2022 1.280 0.450 - 4.500 uIU/mL Final         Passed - Completed PHQ-2 or PHQ-9 in the last 360 days      Passed - Last BP in normal range    BP Readings from Last 1 Encounters:  03/25/23 138/84         Passed - Last Heart Rate in normal range    Pulse Readings from Last 1 Encounters:  03/25/23 83         Passed - Valid encounter within last 6 months    Recent Outpatient Visits           2 weeks ago Norovirus   [place] [facility] [place], Netta Neat, DO   3 weeks ago Acute non-recurrent frontal sinusitis   [place] [facility] [place], Netta Neat, DO   1 month ago Simple chronic bronchitis [facility])   [place] [facility] Althea Charon, Netta Neat, DO   3 months ago Long COVID   [place] [facility] [place], Netta Neat, DO   4 months ago COPD with acute exacerbation [facility])   [place] [facility] Smitty Cords, DO       Future Appointments             In 1  month Althea Charon, Netta Neat, DO  [facility], [facility]  Passed - CBC within normal limits and completed in the last 12 months    WBC  Date Value Ref Range Status  12/13/2022 9.0 4.0 - 10.5 K/uL Final   RBC  Date Value Ref Range Status  12/13/2022 4.74 3.87 - 5.11 MIL/uL Final   Hemoglobin  Date Value Ref Range Status  12/13/2022 13.0 12.0 - 15.0 g/dL Final   HGB  Date Value Ref Range Status  05/06/2014 13.2 12.0 - 16.0 g/dL Final   HCT  Date Value Ref Range Status  12/13/2022 41.1 36.0 - 46.0 % Final  05/06/2014 40.0 35.0 - 47.0 % Final   MCHC  Date Value Ref Range Status  12/13/2022 31.6 30.0 - 36.0 g/dL Final   [place]  Date Value Ref Range Status  12/13/2022 27.4 26.0 - 34.0 pg Final   MCV  Date Value Ref Range Status  12/13/2022 86.7 80.0 - 100.0 fL Final  05/06/2014 84 80 - 100 fL Final   No results found for: "PLTCOUNTKUC", "LABPLAT", "POCPLA" RDW  Date Value Ref Range Status  12/13/2022 13.8 11.5 - 15.5 % Final  05/06/2014 13.4 11.5 - 14.5 % Final

## 2023-05-15 ENCOUNTER — Ambulatory Visit: Payer: Medicare PPO | Admitting: Oncology

## 2023-05-19 ENCOUNTER — Telehealth: Payer: Medicare PPO | Admitting: Nurse Practitioner

## 2023-05-19 ENCOUNTER — Encounter: Payer: Self-pay | Admitting: Nurse Practitioner

## 2023-05-19 DIAGNOSIS — J0111 Acute recurrent frontal sinusitis: Secondary | ICD-10-CM | POA: Diagnosis not present

## 2023-05-19 MED ORDER — AMOXICILLIN-POT CLAVULANATE 875-125 MG PO TABS: 1.0000 | ORAL_TABLET | Freq: Two times a day (BID) | ORAL | 0 refills | Status: DC

## 2023-05-19 NOTE — Patient Instructions (Signed)
Langley Adie, thank you for joining Claiborne Rigg, NP for today's virtual visit.  While this provider is not your primary care provider (PCP), if your PCP is located in our provider database this encounter information will be shared with them immediately following your visit.   A [facility] MyChart account gives you access to today's visit and all your visits, tests, and labs performed at [facility] " click here if you don't have a [facility] MyChart account or go to [email or web address]  Consent: (Patient) Elizabeth [name] provided verbal consent for this virtual visit at the beginning of the encounter.  Current Medications:  Current Outpatient Medications:    albuterol (VENTOLIN HFA) 108 (90 Base) MCG/ACT inhaler, Inhale 2 puffs into the lungs every 6 (six) hours as needed for wheezing or shortness of breath., Disp: 8 g, Rfl: 5   amoxicillin-clavulanate (AUGMENTIN) 875-125 MG tablet, Take 1 tablet by mouth 2 (two) times daily for 7 days., Disp: 14 tablet, Rfl: 0   ARIPiprazole (ABILIFY) 5 MG tablet, Take 1 tablet by mouth once daily, Disp: 90 tablet, Rfl: 1   cyanocobalamin (VITAMIN B12) 1000 MCG/ML injection, Inject 1 mL (1,000 mcg total) into the muscle every 30 (thirty) days., Disp: 3 mL, Rfl: 1   fluticasone (FLONASE) 50 MCG/ACT nasal spray, Place 2 sprays into both nostrils daily., Disp: , Rfl: 1   furosemide (LASIX) 20 MG tablet, Take 1 tablet (20 mg total) by mouth daily., Disp: 90 tablet, Rfl: 1   losartan (COZAAR) 50 MG tablet, Take 1 tablet (50 mg total) by mouth daily., Disp: 90 tablet, Rfl: 1   magnesium oxide (MAG-OX) 400 MG tablet, Take 1 tablet (400 mg total) by mouth daily., Disp: 90 tablet, Rfl: 3   MOUNJARO 2.5 MG/0.5ML Pen, Inject 2.5 mg into the skin once a week., Disp: 2 mL, Rfl: 5   Multiple Vitamin (MULTI-VITAMINS) TABS, Take by mouth., Disp: , Rfl:    omeprazole (PRILOSEC) 20 MG capsule, TAKE 1 CAPSULE BY MOUTH ONCE DAILY BEFORE BREAKFAST, Disp:  90 capsule, Rfl: 0   ondansetron (ZOFRAN-ODT) 4 MG disintegrating tablet, Take 1 tablet (4 mg total) by mouth daily as needed for nausea or vomiting., Disp: 20 tablet, Rfl: 0   potassium chloride SA (KLOR-CON M) 20 MEQ tablet, Take 1 tablet by mouth once daily, Disp: 90 tablet, Rfl: 0   predniSONE (DELTASONE) 20 MG tablet, Take daily with food. Start with 60mg  (3 pills) x 2 days, then reduce to 40mg  (2 pills) x 2 days, then 20mg  (1 pill) x 3 days (Patient not taking: Reported on 03/25/2023), Disp: 13 tablet, Rfl: 0   simvastatin (ZOCOR) 80 MG tablet, TAKE 1 TABLET BY MOUTH AT BEDTIME, Disp: 90 tablet, Rfl: 0   SPIRIVA HANDIHALER 18 MCG inhalation capsule, Place 1 capsule (18 mcg total) into inhaler and inhale daily., Disp: 90 capsule, Rfl: 3   SYMBICORT 160-4.5 MCG/ACT inhaler, Inhale 2 puffs into the lungs., Disp: , Rfl:    venlafaxine XR (EFFEXOR-XR) 75 MG 24 hr capsule, TAKE 3 CAPSULES BY MOUTH ONCE DAILY WITH BREAKFAST, Disp: 270 capsule, Rfl: 1   Medications ordered in this encounter:  Meds ordered this encounter  Medications   amoxicillin-clavulanate (AUGMENTIN) 875-125 MG tablet    Sig: Take 1 tablet by mouth 2 (two) times daily for 7 days.    Dispense:  14 tablet    Refill:  0    Supervising Provider:   Merrilee Jansky [phone]     *If you need refills  on other medications prior to your next appointment, please contact your pharmacy*  Follow-Up: Call back or seek an in-person evaluation if the symptoms worsen or if the condition fails to improve as anticipated.  [facility] [phone]  Other Instructions INSTRUCTIONS: use a humidifier for nasal congestion Drink plenty of fluids, rest and wash hands frequently to avoid the spread of infection Alternate tylenol and Motrin for relief of fever    If you have been instructed to have an in-person evaluation today at a local Urgent Care facility, please use the link below. It will take you to a list of all of our  available [facility], including address, phone number and hours of operation. Please do not delay care.  [facility]  If you or a family member do not have a primary care provider, use the link below to schedule a visit and establish care. When you choose a [facility] primary care physician or advanced practice provider, you gain a long-term partner in health. Find a Primary Care Provider  Learn more about [facility]'s in-office and virtual care options:  - Get Care Now

## 2023-05-19 NOTE — Progress Notes (Signed)
Virtual Visit Consent   Elizabeth [name], you are scheduled for a virtual visit with a [facility] provider today. Just as with appointments in the office, your consent must be obtained to participate. Your consent will be active for this visit and any virtual visit you may have with one of our providers in the next 365 days. If you have a MyChart account, a copy of this consent can be sent to you electronically.  As this is a virtual visit, video technology does not allow for your provider to perform a traditional examination. This may limit your provider's ability to fully assess your condition. If your provider identifies any concerns that need to be evaluated in person or the need to arrange testing (such as labs, EKG, etc.), we will make arrangements to do so. Although advances in technology are sophisticated, we cannot ensure that it will always work on either your end or our end. If the connection with a video visit is poor, the visit may have to be switched to a telephone visit. With either a video or telephone visit, we are not always able to ensure that we have a secure connection.  By engaging in this virtual visit, you consent to the provision of healthcare and authorize for your insurance to be billed (if applicable) for the services provided during this visit. Depending on your insurance coverage, you may receive a charge related to this service.  I need to obtain your verbal consent now. Are you willing to proceed with your visit today? Elizabeth [name] has provided verbal consent on 05/19/2023 for a virtual visit (video or telephone). Claiborne Rigg, NP  Date: 05/19/2023 9:05 AM   Virtual Visit via Video Note   I, Claiborne Rigg, connected with  Elizabeth [name]  ([id number], 07-15-1950) on 05/19/23 at  8:30 AM EST by a video-enabled telemedicine application and verified that I am speaking with the correct person using two identifiers.  Location: Patient: Virtual Visit Location Patient:  Home Provider: Virtual Visit Location Provider: Home Office   I discussed the limitations of evaluation and management by telemedicine and the availability of in person appointments. The patient expressed understanding and agreed to proceed.    History of Present Illness: Elizabeth [name] is a 73 y.o. who identifies as a female who was assigned female at birth, and is being seen today for Sinus infection.  [name] is being seen today for sinusitis. She has been experiencing fever (Temp 99.9-100.5) Sinus pressure, Sinus headache, nose bleeds, sneezing, over the past 7-8 days. Symptoms seemed to have worsened this week.   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 03/11/2018   Senile nuclear sclerosis, left 03/11/2018   Substance abuse (HCC)    Genetic testing    Pernicious anemia 11/27/2016   Normocytic anemia 10/16/2016   Iron deficiency anemia 10/16/2016   Centrilobular emphysema (HCC)    Major depressive disorder, recurrent, moderate (HCC) 09/24/2016   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   Bilateral carpal tunnel syndrome 05/27/2015   Bilateral hand numbness 05/17/2015   Fracture of humerus, proximal, closed 03/23/2013   Lipoma of skin and subcutaneous tissue 12/26/2011   B12  deficiency 02/15/2011   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   Dyspepsia and other specified disorders of function of stomach 10/11/2010   Benign essential hypertension 10/11/2010   Mixed hyperlipidemia 10/11/2010   Tobacco use disorder 10/11/2010   Anxiety state 08/25/2009   Shortness of breath 07/24/2006    Allergies:  Allergies  Allergen Reactions    Moxifloxacin Itching, Other (See Comments), Photosensitivity, Rash and Swelling    Causes swelling, redness, burning in eyes    Levofloxacin Other (See Comments)    Other reaction(s): Muscle Pain Other reaction(s): Muscle Pain   Medications:  Current Outpatient Medications:    albuterol (VENTOLIN HFA) 108 (90 Base) MCG/ACT inhaler, Inhale 2 puffs into the lungs every 6 (six) hours as needed for wheezing or shortness of breath., Disp: 8 g, Rfl: 5   amoxicillin-clavulanate (AUGMENTIN) 875-125 MG tablet, Take 1 tablet by mouth 2 (two) times daily for 7 days., Disp: 14 tablet, Rfl: 0   ARIPiprazole (ABILIFY) 5 MG tablet, Take 1 tablet by mouth once daily, Disp: 90 tablet, Rfl: 1   cyanocobalamin (VITAMIN B12) 1000 MCG/ML injection, Inject 1 mL (1,000 mcg total) into the muscle every 30 (thirty) days., Disp: 3 mL, Rfl: 1   fluticasone (FLONASE) 50 MCG/ACT nasal spray, Place 2 sprays into both nostrils daily., Disp: , Rfl: 1   furosemide (LASIX) 20 MG tablet, Take 1 tablet (20 mg total) by mouth daily., Disp: 90 tablet, Rfl: 1   losartan (COZAAR) 50 MG tablet, Take 1 tablet (50 mg total) by mouth daily., Disp: 90 tablet, Rfl: 1   magnesium oxide (MAG-OX) 400 MG tablet, Take 1 tablet (400 mg total) by mouth daily., Disp: 90 tablet, Rfl: 3   MOUNJARO 2.5 MG/0.5ML Pen, Inject 2.5 mg into the skin once a week., Disp: 2 mL, Rfl: 5   Multiple Vitamin (MULTI-VITAMINS) TABS, Take by mouth., Disp: , Rfl:    omeprazole (PRILOSEC) 20 MG capsule, TAKE 1 CAPSULE BY MOUTH ONCE DAILY BEFORE BREAKFAST, Disp: 90 capsule, Rfl: 0   ondansetron (ZOFRAN-ODT) 4 MG disintegrating tablet, Take 1 tablet (4 mg total) by mouth daily as needed for nausea or vomiting., Disp: 20 tablet, Rfl: 0   potassium chloride SA (KLOR-CON M) 20 MEQ tablet, Take 1 tablet by mouth once daily, Disp: 90 tablet, Rfl: 0   predniSONE (DELTASONE) 20 MG tablet, Take daily with food. Start with 60mg  (3 pills) x 2 days, then reduce to 40mg  (2 pills) x  2 days, then 20mg  (1 pill) x 3 days (Patient not taking: Reported on 03/25/2023), Disp: 13 tablet, Rfl: 0   simvastatin (ZOCOR) 80 MG tablet, TAKE 1 TABLET BY MOUTH AT BEDTIME, Disp: 90 tablet, Rfl: 0   SPIRIVA HANDIHALER 18 MCG inhalation capsule, Place 1 capsule (18 mcg total) into inhaler and inhale daily., Disp: 90 capsule, Rfl: 3   SYMBICORT 160-4.5 MCG/ACT inhaler, Inhale 2 puffs into the lungs., Disp: , Rfl:    venlafaxine XR (EFFEXOR-XR) 75 MG 24 hr capsule, TAKE 3 CAPSULES BY MOUTH ONCE DAILY WITH BREAKFAST, Disp: 270 capsule, Rfl: 1  Observations/Objective: Patient is well-developed, well-nourished in no acute distress.  Resting comfortably  at home.  Head is normocephalic, atraumatic.  No labored breathing.  Speech is clear and coherent with logical content.  Patient is alert and oriented at baseline.    Assessment and Plan: 1. Acute recurrent frontal sinusitis - amoxicillin-clavulanate (AUGMENTIN) 875-125 MG tablet; Take 1 tablet by mouth 2 (two) times daily for 7 days.  Dispense: 14 tablet;  Refill: 0  INSTRUCTIONS: use a humidifier for nasal congestion Drink plenty of fluids, rest and wash hands frequently to avoid the spread of infection Alternate tylenol and Motrin for relief of fever   Follow Up Instructions: I discussed the assessment and treatment plan with the patient. The patient was provided an opportunity to ask questions and all were answered. The patient agreed with the plan and demonstrated an understanding of the instructions.  A copy of instructions were sent to the patient via MyChart unless otherwise noted below.    The patient was advised to call back or seek an in-person evaluation if the symptoms worsen or if the condition fails to improve as anticipated.    Claiborne Rigg, NP

## 2023-05-22 ENCOUNTER — Encounter: Payer: Self-pay | Admitting: Family Medicine

## 2023-05-22 DIAGNOSIS — J0110 Acute frontal sinusitis, unspecified: Secondary | ICD-10-CM

## 2023-05-22 MED ORDER — DOXYCYCLINE HYCLATE 100 MG PO TABS: 100.0000 mg | ORAL_TABLET | Freq: Two times a day (BID) | ORAL | 0 refills | Status: DC

## 2023-05-22 NOTE — Addendum Note (Signed)
Addended by: Smitty Cords on: 05/22/2023 01:40 PM   Modules accepted: Orders

## 2023-05-29 ENCOUNTER — Encounter: Payer: Self-pay | Admitting: Family Medicine

## 2023-06-07 ENCOUNTER — Other Ambulatory Visit: Payer: Self-pay | Admitting: Family Medicine

## 2023-06-07 DIAGNOSIS — F331 Major depressive disorder, recurrent, moderate: Secondary | ICD-10-CM

## 2023-06-10 NOTE — Telephone Encounter (Signed)
 Requested medication (s) are due for refill today: yes  Requested medication (s) are on the active medication list: yes  Last refill:  09/14/22 #270 1 RF  Future visit scheduled: yes  Notes to clinic:  overdue lab work    Requested Prescriptions  Pending Prescriptions Disp Refills   venlafaxine XR (EFFEXOR-XR) 75 MG 24 hr capsule [Pharmacy Med Name: Venlafaxine HCl ER 75 MG Oral Capsule Extended Release 24 Hour] 270 capsule 0    Sig: TAKE 3 CAPSULES BY MOUTH ONCE DAILY WITH BREAKFAST     Psychiatry: Antidepressants - SNRI - desvenlafaxine & venlafaxine Failed - 06/10/2023 11:43 AM      Failed - Lipid Panel in normal range within the last 12 months    Cholesterol  Date Value Ref Range Status  09/28/2021 181 <200 mg/dL Final   LDL Cholesterol (Calc)  Date Value Ref Range Status  09/28/2021 99 mg/dL (calc) Final    Comment:    Reference range: <100 . Desirable range <100 mg/dL for primary prevention;   <70 mg/dL for patients with CHD or diabetic patients  with > or = 2 CHD risk factors. Marland Kitchen LDL-C is now calculated using the Martin-Hopkins  calculation, which is a validated novel method providing  better accuracy than the Friedewald equation in the  estimation of LDL-C.  Horald Pollen et al. Lenox Ahr. 3086;578(46): 2061-2068  (http://education.[employer].com/faq/FAQ164)    HDL  Date Value Ref Range Status  09/28/2021 55 > OR = 50 mg/dL Final   Triglycerides  Date Value Ref Range Status  09/28/2021 171 (H) <150 mg/dL Final         Passed - Cr in normal range and within 360 days    Creat  Date Value Ref Range Status  09/18/2022 0.74 0.60 - 1.00 mg/dL Final   Creatinine, Ser  Date Value Ref Range Status  10/27/2022 0.83 0.44 - 1.00 mg/dL Final         Passed - Completed PHQ-2 or PHQ-9 in the last 360 days      Passed - Last BP in normal range    BP Readings from Last 1 Encounters:  03/25/23 138/84         Passed - Valid encounter within last 6 months    Recent  Outpatient Visits           1 month ago Norovirus   [facility] [place], Elizabeth Neat, Elizabeth [name]   1 month ago Acute non-recurrent frontal sinusitis   [place] [place], Elizabeth Neat, Elizabeth [name]   2 months ago Simple chronic bronchitis [facility])   [facility] [place], Elizabeth Neat, Elizabeth [name]   4 months ago Long COVID   [facility] [place], Elizabeth Neat, Elizabeth [name]   5 months ago COPD with acute exacerbation [facility])   [place] [facility] Elizabeth [name], Elizabeth Neat, Elizabeth [name]       Future Appointments             In 2 weeks Elizabeth [name], Elizabeth Neat, Elizabeth [name] [facility], [facility]

## 2023-06-14 ENCOUNTER — Other Ambulatory Visit: Payer: Self-pay | Admitting: Family Medicine

## 2023-06-14 DIAGNOSIS — K219 Gastro-esophageal reflux disease without esophagitis: Secondary | ICD-10-CM

## 2023-06-14 NOTE — Telephone Encounter (Signed)
 Requested Prescriptions  Pending Prescriptions Disp Refills   omeprazole (PRILOSEC) 20 MG capsule [Pharmacy Med Name: Omeprazole 20 MG Oral Capsule Delayed Release] 90 capsule 0    Sig: TAKE 1 CAPSULE BY MOUTH ONCE DAILY BEFORE BREAKFAST     Gastroenterology: Proton Pump Inhibitors Passed - 06/14/2023  4:15 PM      Passed - Valid encounter within last 12 months    Recent Outpatient Visits           1 month ago Norovirus   [facility] [place], Netta Neat, DO   1 month ago Acute non-recurrent frontal sinusitis   [facility] Smitty Cords, DO   2 months ago Simple chronic bronchitis [facility])   [facility] Smitty Cords, DO   5 months ago Long COVID   [place] [facility] [place], Netta Neat, DO   5 months ago COPD with acute exacerbation [facility])   [place] [facility] Smitty Cords, DO       Future Appointments             In 1 week Althea Charon, Netta Neat, DO [facility], [facility]

## 2023-06-24 ENCOUNTER — Ambulatory Visit: Payer: Medicare PPO | Admitting: Family Medicine

## 2023-06-24 ENCOUNTER — Encounter: Payer: Self-pay | Admitting: Family Medicine

## 2023-06-24 ENCOUNTER — Other Ambulatory Visit: Payer: Self-pay | Admitting: Family Medicine

## 2023-06-24 VITALS — BP 148/68 | HR 92 | Ht 62.0 in | Wt 187.0 lb

## 2023-06-24 DIAGNOSIS — E1169 Type 2 diabetes mellitus with other specified complication: Secondary | ICD-10-CM

## 2023-06-24 DIAGNOSIS — E049 Nontoxic goiter, unspecified: Secondary | ICD-10-CM

## 2023-06-24 DIAGNOSIS — Z0000 Encounter for general adult medical examination without abnormal findings: Secondary | ICD-10-CM

## 2023-06-24 DIAGNOSIS — J410 Simple chronic bronchitis: Secondary | ICD-10-CM | POA: Diagnosis not present

## 2023-06-24 DIAGNOSIS — E538 Deficiency of other specified B group vitamins: Secondary | ICD-10-CM

## 2023-06-24 DIAGNOSIS — D508 Other iron deficiency anemias: Secondary | ICD-10-CM

## 2023-06-24 DIAGNOSIS — R82998 Other abnormal findings in urine: Secondary | ICD-10-CM

## 2023-06-24 DIAGNOSIS — D510 Vitamin B12 deficiency anemia due to intrinsic factor deficiency: Secondary | ICD-10-CM

## 2023-06-24 DIAGNOSIS — E559 Vitamin D deficiency, unspecified: Secondary | ICD-10-CM

## 2023-06-24 DIAGNOSIS — E782 Mixed hyperlipidemia: Secondary | ICD-10-CM

## 2023-06-24 LAB — POCT GLYCOSYLATED HEMOGLOBIN (HGB A1C): Hemoglobin A1C: 7.1 % — AB (ref 4.0–5.6)

## 2023-06-24 MED ORDER — AMOXICILLIN-POT CLAVULANATE 400-57 MG/5ML PO SUSR: 875.0000 mg | Freq: Three times a day (TID) | ORAL | 0 refills | Status: DC

## 2023-06-24 MED ORDER — PREDNISONE 20 MG PO TABS: ORAL_TABLET | ORAL | 0 refills | Status: DC

## 2023-06-24 NOTE — Patient Instructions (Addendum)
 Thank you for coming to the office today.  Take Augmentin antibiotic today - liquid. Take Prednisone taper as well  Urinalysis today to check for UTI  Keep up with Pulmonology Dr Glenard Haring and Hematology Dr Orlie Dakin  DUE for FASTING BLOOD WORK (no food or drink after midnight before the lab appointment, only water or coffee without cream/sugar on the morning of)  SCHEDULE "Lab Only" visit in the morning at the clinic for lab draw in 3 MONTHS   - Make sure Lab Only appointment is at about 1 week before your next appointment, so that results will be available  For Lab Results, once available within 2-3 days of blood draw, you can can log in to MyChart online to view your results and a brief explanation. Also, we can discuss results at next follow-up visit.   Please schedule a Follow-up Appointment to: Return in about 3 months (around 09/24/2023) for 3 month fasting lab > 1 week later Annual Physical.  If you have any other questions or concerns, please feel free to call the office or send a message through MyChart. You may also schedule an earlier appointment if necessary.  Additionally, you may be receiving a survey about your experience at our office within a few days to 1 week by e-mail or mail. We value your feedback.  Saralyn Pilar, DO [facility], [place]

## 2023-06-24 NOTE — Progress Notes (Signed)
 Subjective:    Patient ID: Jadon Ressler, female    DOB: 04-21-1950, 73 y.o.   MRN: [id number]  Waunita Sandstrom is a 73 y.o. female presenting on 06/24/2023 for COPD and Diabetes   HPI  Discussed the use of AI scribe software for clinical note transcription with the patient, who gave verbal consent to proceed.  History of Present Illness   Marcedes Tech "Andrey Campanile" is a 73 year old female with COPD and type 2 diabetes who presents with worsening respiratory symptoms and fatigue. She is accompanied by her daughter, who assists with her care.  She experiences persistent wheezing and shortness of breath, which have worsened over time. Significant fatigue and weakness are present, and she 'can't even walk to the front door without gasping.' She has a history of COPD, and any respiratory infection tends to exacerbate her symptoms. She is currently on oxygen at night and is scheduled for a walking test at [facility] for oxygen evaluation. No recent fever, and her last antibiotic treatment was about a month ago, which temporarily improved her symptoms. -Upcoming apt with [facility] Dr Glenard Haring for COPD -Prior work up cardiac 2023-2024 with CT imaging and further eval negative for significant ischemic cardiac disease  She mentions a recent weight loss of ten pounds over the past three months without trying, which she finds concerning. She attributes some of her symptoms to a possible sinus infection that she feels has moved to her lungs, causing her to cough up phlegm. - Reduced oral intake lately  Type 2 Diabetes Newer diagnosis. Her type 2 diabetes is noted, with a recent HbA1c of 7.1, up from 6.8. She has not started [facility] and expresses reluctance to do so. She drinks a large amount of sweet tea daily, which may impact her blood sugar levels.  She describes her urine as dark and sometimes almost orange with an odor, raising concerns about a possible kidney infection. She has not had a urine analysis since July  of the previous year.          06/24/2023    1:19 PM 03/25/2023    9:18 AM 09/17/2022    9:46 AM  Depression screen PHQ 2/9  Decreased Interest 0 0 3  Down, Depressed, Hopeless 0 0 3  PHQ - 2 Score 0 0 6  Altered sleeping   3  Tired, decreased energy   3  Change in appetite   2  Feeling bad or failure about yourself    0  Trouble concentrating   1  Moving slowly or fidgety/restless   0  Suicidal thoughts   0  PHQ-9 Score   15       06/24/2023    1:19 PM 03/25/2023    9:18 AM 09/17/2022    9:47 AM 10/06/2021   10:25 AM  GAD 7 : Generalized Anxiety Score  Nervous, Anxious, on Edge  0 3 0  Control/stop worrying  0 2 1  Worry too much - different things  0 1 1  Trouble relaxing  0 1 1  Restless  0 0 0  Easily annoyed or irritable  0 3 1  Afraid - awful might happen  0 0 0  Total GAD 7 Score  0 10 4  Anxiety Difficulty Not difficult at all       Social History   Tobacco Use   Smoking status: Every Day    Current packs/day: 0.50    Average packs/day: 0.5 packs/day for 52.0 years (26.0 ttl  pk-yrs)    Types: Cigarettes   Smokeless tobacco: Never   Tobacco comments:    5 ciggarette a day--03/02/2020  Vaping Use   Vaping status: Some Days  Substance Use Topics   Alcohol use: No   Drug use: No    Review of Systems Per HPI unless specifically indicated above     Objective:    BP (!) 148/68 (BP Location: Left Arm, Cuff Size: Normal)   Pulse 92   Ht 5\' 2"  (1.575 m)   Wt 187 lb (84.8 kg)   SpO2 97%   BMI 34.20 kg/m   Wt Readings from Last 3 Encounters:  06/24/23 187 lb (84.8 kg)  03/25/23 196 lb (88.9 kg)  12/27/22 196 lb 6.4 oz (89.1 kg)    Physical Exam Vitals and nursing note reviewed.  Constitutional:      General: She is not in acute distress.    Appearance: She is well-developed. She is obese. She is not diaphoretic.     Comments: Elderly tired appearing 73 yr female, comfortable but coughing, cooperative  HENT:     Head: Normocephalic and  atraumatic.  Eyes:     General:        Right eye: No discharge.        Left eye: No discharge.     Conjunctiva/sclera: Conjunctivae normal.  Neck:     Thyroid: No thyromegaly.  Cardiovascular:     Rate and Rhythm: Normal rate and regular rhythm.     Heart sounds: Normal heart sounds. No murmur heard. Pulmonary:     Effort: No respiratory distress.     Breath sounds: Wheezing present. No rales.     Comments: Slight inc work of respiratory effort, some coughing Musculoskeletal:        General: Normal range of motion.     Cervical back: Normal range of motion and neck supple.  Lymphadenopathy:     Cervical: No cervical adenopathy.  Skin:    General: Skin is warm and dry.     Findings: No erythema or rash.  Neurological:     Mental Status: She is alert and oriented to person, place, and time.  Psychiatric:        Behavior: Behavior normal.     Comments: Well groomed, good eye contact, normal speech and thoughts     I have personally reviewed the radiology report from 12/21/22 on CT Chest.  CLINICAL DATA:  Left lung cancer restaging * Tracking Code: BO *   EXAM: CT CHEST WITH CONTRAST   TECHNIQUE: Multidetector CT imaging of the chest was performed during intravenous contrast administration.   RADIATION DOSE REDUCTION: This exam was performed according to the departmental dose-optimization program which includes automated exposure control, adjustment of the mA and/or kV according to patient size and/or use of iterative reconstruction technique.   CONTRAST:  75mL OMNIPAQUE IOHEXOL 300 MG/ML  SOLN   COMPARISON:  04/19/2022   FINDINGS: Cardiovascular: Aortic atherosclerosis. Normal heart size. Left coronary artery calcifications. No pericardial effusion.   Mediastinum/Nodes: No enlarged mediastinal, hilar, or axillary lymph nodes. Thyroid gland, trachea, and esophagus demonstrate no significant findings.   Lungs/Pleura: Mild centrilobular emphysema. Diffuse  bilateral bronchial wall thickening. Background of fine centrilobular nodularity, most concentrated in the lung apices. Numerous small unchanged small solid and ground-glass nodules, for example a 0.5 cm solid nodule of the extreme superior segment right lower lobe (series 3, image 29) and a 0.4 cm ground-glass nodule of the posterior right upper lobe (series 3,  image 37). Unchanged small focus of radiation fibrosis and irregular scarring in the peripheral left lower lobe (series 3, image 65). No pleural effusion or pneumothorax.   Upper Abdomen: No acute abnormality. Hepatic steatosis. Small gallstones. 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   Musculoskeletal: No chest wall abnormality. Unchanged chronic, sclerotic fractures of the posterolateral left sixth rib underlying radiation change (series 2, image 58).   IMPRESSION: 1. Unchanged small focus of radiation fibrosis and irregular scarring in the peripheral left lower lobe. 2. Unchanged chronic, sclerotic fractures of the posterolateral left sixth rib underlying radiation change. 3. Numerous small unchanged small solid and ground-glass nodules, measuring up to 0.5 cm. These are most likely benign and incidental. Continued attention on follow-up. 4. Emphysema with background of fine centrilobular nodularity, most concentrated in the lung apices, consistent with smoking-related respiratory bronchiolitis. 5. Cholelithiasis. 6. Coronary artery disease.   Aortic Atherosclerosis (ICD10-I70.0) and Emphysema (ICD10-J43.9).     Electronically Signed   By: Jearld Lesch M.D.   On: 12/26/2022 11:07   Results for orders placed or performed in visit on 06/24/23  POCT HgB A1C   Collection Time: 06/24/23  1:25 PM  Result Value Ref Range   Hemoglobin A1C 7.1 (A) 4.0 - 5.6 %   HbA1c POC (<> result,  manual entry)     HbA1c, POC (prediabetic range)     HbA1c, POC (controlled diabetic range)        Assessment & Plan:   Problem List Items Addressed This Visit     Type 2 diabetes mellitus with other specified complication (HCC) - Primary   Relevant Orders   POCT HgB A1C (Completed)   Other Visit Diagnoses       Dark urine       Relevant Orders   Urinalysis, Routine w reflex microscopic   Urine Culture     Simple chronic bronchitis (HCC)       Relevant Medications   amoxicillin-clavulanate (AUGMENTIN) 400-57 MG/5ML suspension   predniSONE (DELTASONE) 20 MG tablet       Emphysema vs Simple Chronic Bronchitis / Chronic Obstructive Pulmonary Disease (COPD) Followed by [facility] Dr Glenard Haring, has upcoming apt further  Currently Experiencing exacerbation with increased wheezing, dyspnea, and productive cough. Fatigue, weakness, and weight loss attributed to exacerbation. Nocturnal oxygen use.   Scheduled for walking test at [facility] for oxygen evaluation. Augmentin liquid preferred due to dysphagia. Steroid taper considered beneficial despite potential side effects. - Prescribe Augmentin liquid for 10 days, twice daily. - Initiate a steroid taper, as it has been three months since the last course. - Encourage follow-up with Dr. Glenard Haring next month for further evaluation.  Dark Urine + Odor - Perform urinalysis to check for infection.  Type 2 Diabetes Mellitus Recent diagnosis elevated sugar HbA1c increased from 6.8% to 7.1%. Diabetes may contribute to fatigue and weight fluctuations. She has not started [facility] due to concerns about weight loss. Current HbA1c level not alarming. - Continue monitoring blood glucose levels and HbA1c. - Reassess the need for [facility] in the future if necessary. She has 2.5mg  initial order but never started yet  General Health Maintenance Due for a physical examination and routine blood work, planned for June per insurance recommendation. - Schedule a  physical examination and blood work in June. - Ensure she receives a reminder for the appointment.        Orders Placed  This Encounter  Procedures   Urine Culture   Urinalysis, Routine w reflex microscopic   POCT HgB A1C    Meds ordered this encounter  Medications   amoxicillin-clavulanate (AUGMENTIN) 400-57 MG/5ML suspension    Sig: Take 10.9 mLs (875 mg total) by mouth 3 (three) times daily for 10 days.    Dispense:  327 mL    Refill:  0   predniSONE (DELTASONE) 20 MG tablet    Sig: Take daily with food. Start with 60mg  (3 pills) x 2 days, then reduce to 40mg  (2 pills) x 2 days, then 20mg  (1 pill) x 3 days    Dispense:  13 tablet    Refill:  0    Follow up plan: Return in about 3 months (around 09/24/2023) for 3 month fasting lab > 1 week later Annual Physical.  Future labs ordered for 09/24/23   Saralyn Pilar, DO [facility] 06/24/2023, 1:30 PM

## 2023-06-25 ENCOUNTER — Encounter: Payer: Self-pay | Admitting: Family Medicine

## 2023-06-25 LAB — URINALYSIS, ROUTINE W REFLEX MICROSCOPIC
Bilirubin Urine: NEGATIVE
Glucose, UA: NEGATIVE
Hgb urine dipstick: NEGATIVE
Hyaline Cast: NONE SEEN /LPF
Ketones, ur: NEGATIVE
Leukocytes,Ua: NEGATIVE
Nitrite: NEGATIVE
Specific Gravity, Urine: 1.018 (ref 1.001–1.035)
pH: 5.5 (ref 5.0–8.0)

## 2023-06-25 LAB — MICROSCOPIC MESSAGE

## 2023-06-25 LAB — URINE CULTURE
MICRO NUMBER:: 16239185
Result:: NO GROWTH
SPECIMEN QUALITY:: ADEQUATE

## 2023-06-30 ENCOUNTER — Encounter: Payer: Self-pay | Admitting: Family Medicine

## 2023-06-30 DIAGNOSIS — J410 Simple chronic bronchitis: Secondary | ICD-10-CM

## 2023-07-01 MED ORDER — DOXYCYCLINE HYCLATE 100 MG PO TABS: 100.0000 mg | ORAL_TABLET | Freq: Two times a day (BID) | ORAL | 0 refills | Status: DC

## 2023-07-09 DIAGNOSIS — J449 Chronic obstructive pulmonary disease, unspecified: Secondary | ICD-10-CM | POA: Diagnosis not present

## 2023-07-12 ENCOUNTER — Other Ambulatory Visit: Payer: Self-pay | Admitting: Family Medicine

## 2023-07-12 DIAGNOSIS — I10 Essential (primary) hypertension: Secondary | ICD-10-CM

## 2023-07-12 NOTE — Telephone Encounter (Signed)
 Requested Prescriptions  Pending Prescriptions Disp Refills   losartan (COZAAR) 50 MG tablet [Pharmacy Med Name: Losartan Potassium 50 MG Oral Tablet] 90 tablet 0    Sig: Take 1 tablet by mouth once daily     Cardiovascular:  Angiotensin Receptor Blockers Failed - 07/12/2023  3:47 PM      Failed - Cr in normal range and within 180 days    Creat  Date Value Ref Range Status  09/18/2022 0.74 0.60 - 1.00 mg/dL Final   Creatinine, Ser  Date Value Ref Range Status  10/27/2022 0.83 0.44 - 1.00 mg/dL Final         Failed - K in normal range and within 180 days    Potassium  Date Value Ref Range Status  10/27/2022 3.6 3.5 - 5.1 mmol/L Final  05/06/2014 3.5 3.5 - 5.1 mmol/L Final         Failed - Last BP in normal range    BP Readings from Last 1 Encounters:  06/24/23 (!) 148/68         Passed - Patient is not pregnant      Passed - Valid encounter within last 6 months    Recent Outpatient Visits           2 weeks ago Type 2 diabetes mellitus with other specified complication, without long-term current use of insulin [facility])   [facility] Ollie, Netta Neat, DO

## 2023-07-15 ENCOUNTER — Other Ambulatory Visit: Payer: Self-pay | Admitting: Family Medicine

## 2023-07-16 NOTE — Telephone Encounter (Signed)
 Requested medication (s) are due for refill today: yes  Requested medication (s) are on the active medication list: yes  Last refill:  01/07/23 #90/1  Future visit scheduled: yes  Notes to clinic:  Unable to refill per protocol due to failed labs, no updated results.      Requested Prescriptions  Pending Prescriptions Disp Refills   furosemide (LASIX) 20 MG tablet [Pharmacy Med Name: Furosemide 20 MG Oral Tablet] 90 tablet 0    Sig: Take 1 tablet by mouth once daily     Cardiovascular:  Diuretics - Loop Failed - 07/16/2023 11:29 AM      Failed - K in normal range and within 180 days    Potassium  Date Value Ref Range Status  10/27/2022 3.6 3.5 - 5.1 mmol/L Final  05/06/2014 3.5 3.5 - 5.1 mmol/L Final         Failed - Ca in normal range and within 180 days    Calcium  Date Value Ref Range Status  10/27/2022 8.6 (L) 8.9 - 10.3 mg/dL Final   Calcium, Total  Date Value Ref Range Status  05/06/2014 8.8 8.5 - 10.1 mg/dL Final         Failed - Na in normal range and within 180 days    Sodium  Date Value Ref Range Status  10/27/2022 137 135 - 145 mmol/L Final  05/06/2014 140 136 - 145 mmol/L Final         Failed - Cr in normal range and within 180 days    Creat  Date Value Ref Range Status  09/18/2022 0.74 0.60 - 1.00 mg/dL Final   Creatinine, Ser  Date Value Ref Range Status  10/27/2022 0.83 0.44 - 1.00 mg/dL Final         Failed - Cl in normal range and within 180 days    Chloride  Date Value Ref Range Status  10/27/2022 101 98 - 111 mmol/L Final  05/06/2014 105 98 - 107 mmol/L Final         Failed - Mg Level in normal range and within 180 days    No results found for: "MG"       Failed - Last BP in normal range    BP Readings from Last 1 Encounters:  06/24/23 (!) 148/68         Passed - Valid encounter within last 6 months    Recent Outpatient Visits           3 weeks ago Type 2 diabetes mellitus with other specified complication, without long-term  current use of insulin [facility])   [facility] Craig, Kayleen Party, DO

## 2023-08-22 ENCOUNTER — Encounter: Payer: Self-pay | Admitting: Family Medicine

## 2023-08-27 ENCOUNTER — Other Ambulatory Visit

## 2023-08-27 DIAGNOSIS — E049 Nontoxic goiter, unspecified: Secondary | ICD-10-CM

## 2023-08-27 DIAGNOSIS — E1169 Type 2 diabetes mellitus with other specified complication: Secondary | ICD-10-CM | POA: Diagnosis not present

## 2023-08-27 DIAGNOSIS — Z0000 Encounter for general adult medical examination without abnormal findings: Secondary | ICD-10-CM

## 2023-08-27 DIAGNOSIS — E538 Deficiency of other specified B group vitamins: Secondary | ICD-10-CM

## 2023-08-27 DIAGNOSIS — D508 Other iron deficiency anemias: Secondary | ICD-10-CM

## 2023-08-27 DIAGNOSIS — J410 Simple chronic bronchitis: Secondary | ICD-10-CM

## 2023-08-27 DIAGNOSIS — E782 Mixed hyperlipidemia: Secondary | ICD-10-CM

## 2023-08-27 DIAGNOSIS — D510 Vitamin B12 deficiency anemia due to intrinsic factor deficiency: Secondary | ICD-10-CM | POA: Diagnosis not present

## 2023-08-27 DIAGNOSIS — E559 Vitamin D deficiency, unspecified: Secondary | ICD-10-CM

## 2023-08-28 DIAGNOSIS — R600 Localized edema: Secondary | ICD-10-CM | POA: Diagnosis not present

## 2023-08-28 DIAGNOSIS — E782 Mixed hyperlipidemia: Secondary | ICD-10-CM | POA: Diagnosis not present

## 2023-08-28 DIAGNOSIS — R5381 Other malaise: Secondary | ICD-10-CM | POA: Diagnosis not present

## 2023-08-28 DIAGNOSIS — I2510 Atherosclerotic heart disease of native coronary artery without angina pectoris: Secondary | ICD-10-CM | POA: Diagnosis not present

## 2023-08-28 DIAGNOSIS — E669 Obesity, unspecified: Secondary | ICD-10-CM | POA: Diagnosis not present

## 2023-08-28 DIAGNOSIS — R0602 Shortness of breath: Secondary | ICD-10-CM | POA: Diagnosis not present

## 2023-08-28 DIAGNOSIS — I10 Essential (primary) hypertension: Secondary | ICD-10-CM | POA: Diagnosis not present

## 2023-08-28 DIAGNOSIS — J431 Panlobular emphysema: Secondary | ICD-10-CM | POA: Diagnosis not present

## 2023-08-28 DIAGNOSIS — R531 Weakness: Secondary | ICD-10-CM | POA: Diagnosis not present

## 2023-08-28 LAB — COMPLETE METABOLIC PANEL WITHOUT GFR
AG Ratio: 1.5 (calc) (ref 1.0–2.5)
ALT: 18 U/L (ref 6–29)
AST: 35 U/L (ref 10–35)
Albumin: 4 g/dL (ref 3.6–5.1)
Alkaline phosphatase (APISO): 104 U/L (ref 37–153)
BUN: 9 mg/dL (ref 7–25)
CO2: 30 mmol/L (ref 20–32)
Calcium: 9.2 mg/dL (ref 8.6–10.4)
Chloride: 103 mmol/L (ref 98–110)
Creat: 0.81 mg/dL (ref 0.60–1.00)
Globulin: 2.7 g/dL (ref 1.9–3.7)
Glucose, Bld: 126 mg/dL — ABNORMAL HIGH (ref 65–99)
Potassium: 4 mmol/L (ref 3.5–5.3)
Sodium: 143 mmol/L (ref 135–146)
Total Bilirubin: 0.5 mg/dL (ref 0.2–1.2)
Total Protein: 6.7 g/dL (ref 6.1–8.1)

## 2023-08-28 LAB — LIPID PANEL
Cholesterol: 190 mg/dL (ref <200)
HDL: 62 mg/dL (ref >=50)
LDL Cholesterol (Calc): 104 mg/dL — ABNORMAL HIGH
Non-HDL Cholesterol (Calc): 128 mg/dL (ref <130)
Total CHOL/HDL Ratio: 3.1 (calc) (ref <5.0)
Triglycerides: 137 mg/dL (ref <150)

## 2023-08-28 LAB — CBC WITH DIFFERENTIAL/PLATELET
Absolute Lymphocytes: 2643 {cells}/uL (ref 850–3900)
Absolute Monocytes: 518 {cells}/uL (ref 200–950)
Basophils Absolute: 7 {cells}/uL (ref 0–200)
Basophils Relative: 0.1 %
Eosinophils Absolute: 7 {cells}/uL — ABNORMAL LOW (ref 15–500)
Eosinophils Relative: 0.1 %
HCT: 37 % (ref 35.0–45.0)
Hemoglobin: 11.8 g/dL (ref 11.7–15.5)
MCH: 26.9 pg — ABNORMAL LOW (ref 27.0–33.0)
MCHC: 31.9 g/dL — ABNORMAL LOW (ref 32.0–36.0)
MCV: 84.5 fL (ref 80.0–100.0)
MPV: 10.2 fL (ref 7.5–12.5)
Monocytes Relative: 7.1 %
Neutro Abs: 4125 {cells}/uL (ref 1500–7800)
Neutrophils Relative %: 56.5 %
Platelets: 287 10*3/uL (ref 140–400)
RBC: 4.38 10*6/uL (ref 3.80–5.10)
RDW: 13.4 % (ref 11.0–15.0)
Total Lymphocyte: 36.2 %
WBC: 7.3 10*3/uL (ref 3.8–10.8)

## 2023-08-28 LAB — VITAMIN D 25 HYDROXY (VIT D DEFICIENCY, FRACTURES): Vit D, 25-Hydroxy: 31 ng/mL (ref 30–100)

## 2023-08-28 LAB — HEMOGLOBIN A1C
Hgb A1c MFr Bld: 7.1 % — ABNORMAL HIGH (ref <5.7)
Mean Plasma Glucose: 157 mg/dL
eAG (mmol/L): 8.7 mmol/L

## 2023-08-28 LAB — MICROALBUMIN / CREATININE URINE RATIO
Creatinine, Urine: 229 mg/dL (ref 20–275)
Microalb Creat Ratio: 246 mg/g{creat} — ABNORMAL HIGH (ref <30)
Microalb, Ur: 56.3 mg/dL

## 2023-08-28 LAB — IRON,TIBC AND FERRITIN PANEL
%SAT: 19 % (ref 16–45)
Ferritin: 20 ng/mL (ref 16–288)
Iron: 67 ng/mL (ref 45–288)
TIBC: 344 ug/dL (ref 250–450)

## 2023-08-28 LAB — TSH: TSH: 1.31 m[IU]/L (ref 0.40–4.50)

## 2023-08-28 LAB — VITAMIN B12: Vitamin B-12: 642 pg/mL (ref 200–1100)

## 2023-08-29 ENCOUNTER — Ambulatory Visit: Payer: Self-pay | Admitting: Family Medicine

## 2023-08-29 ENCOUNTER — Telehealth: Payer: Self-pay | Admitting: *Deleted

## 2023-08-29 NOTE — Telephone Encounter (Signed)
 Spoke with patient and advised per Dr. Adrian Alba, labs were within normal limits and he did not think her symptoms were related to her iron. I advised that she keep her follow up appointment with her PCP on 6/6 to discuss her symptoms further. She expressed understanding.

## 2023-08-29 NOTE — Telephone Encounter (Signed)
 Patient thinks that she should get an appointment for Dr. Adrian Alba because she is weak wants an appointment if can get 1 and if the ferritin is at a level of 20 and they feel like she should get some IV iron she really wants 1.  Also she says she stumbles a little bit when she is walking around.

## 2023-09-02 DIAGNOSIS — L218 Other seborrheic dermatitis: Secondary | ICD-10-CM | POA: Diagnosis not present

## 2023-09-02 DIAGNOSIS — L538 Other specified erythematous conditions: Secondary | ICD-10-CM | POA: Diagnosis not present

## 2023-09-02 DIAGNOSIS — B079 Viral wart, unspecified: Secondary | ICD-10-CM | POA: Diagnosis not present

## 2023-09-02 DIAGNOSIS — L821 Other seborrheic keratosis: Secondary | ICD-10-CM | POA: Diagnosis not present

## 2023-09-02 DIAGNOSIS — D485 Neoplasm of uncertain behavior of skin: Secondary | ICD-10-CM | POA: Diagnosis not present

## 2023-09-02 DIAGNOSIS — D225 Melanocytic nevi of trunk: Secondary | ICD-10-CM | POA: Diagnosis not present

## 2023-09-02 DIAGNOSIS — B078 Other viral warts: Secondary | ICD-10-CM | POA: Diagnosis not present

## 2023-09-02 DIAGNOSIS — D2262 Melanocytic nevi of left upper limb, including shoulder: Secondary | ICD-10-CM | POA: Diagnosis not present

## 2023-09-02 DIAGNOSIS — D2261 Melanocytic nevi of right upper limb, including shoulder: Secondary | ICD-10-CM | POA: Diagnosis not present

## 2023-09-03 ENCOUNTER — Encounter: Admitting: Family Medicine

## 2023-09-04 ENCOUNTER — Encounter: Payer: Self-pay | Admitting: Family Medicine

## 2023-09-04 ENCOUNTER — Ambulatory Visit (INDEPENDENT_AMBULATORY_CARE_PROVIDER_SITE_OTHER): Admitting: Family Medicine

## 2023-09-04 ENCOUNTER — Other Ambulatory Visit: Payer: Self-pay | Admitting: Internal Medicine

## 2023-09-04 VITALS — BP 122/74 | HR 84 | Ht 62.0 in | Wt 182.5 lb

## 2023-09-04 DIAGNOSIS — R0602 Shortness of breath: Secondary | ICD-10-CM

## 2023-09-04 DIAGNOSIS — Z0000 Encounter for general adult medical examination without abnormal findings: Secondary | ICD-10-CM | POA: Diagnosis not present

## 2023-09-04 DIAGNOSIS — E782 Mixed hyperlipidemia: Secondary | ICD-10-CM | POA: Diagnosis not present

## 2023-09-04 DIAGNOSIS — Z23 Encounter for immunization: Secondary | ICD-10-CM | POA: Diagnosis not present

## 2023-09-04 DIAGNOSIS — E1169 Type 2 diabetes mellitus with other specified complication: Secondary | ICD-10-CM

## 2023-09-04 DIAGNOSIS — Z7984 Long term (current) use of oral hypoglycemic drugs: Secondary | ICD-10-CM

## 2023-09-04 DIAGNOSIS — E559 Vitamin D deficiency, unspecified: Secondary | ICD-10-CM

## 2023-09-04 DIAGNOSIS — J410 Simple chronic bronchitis: Secondary | ICD-10-CM | POA: Diagnosis not present

## 2023-09-04 MED ORDER — RYBELSUS 3 MG PO TABS: 3.0000 mg | ORAL_TABLET | Freq: Every day | ORAL | Status: DC

## 2023-09-04 MED ORDER — OHTUVAYRE 3 MG/2.5ML IN SUSP: 3.0000 mg | Freq: Two times a day (BID) | RESPIRATORY_TRACT | 2 refills | Status: DC

## 2023-09-04 NOTE — Patient Instructions (Addendum)
 Thank you for coming to the office today.  Routine Diabetic Eye Exam is due.  Starting dose is 3mg  once daily, first thing in morning on empty stomach, sip of water, no other medicines. Wait 30 min before first meal of day.  After 30 days we need to increase dose up to 7 mg - call or message us  to request new rx of the higher dose.  New COPD medication - nebulized  Ohtuvayre  twice a day. Every day.  Check with insurance on which company medical supply can order the Nebulizer machine, or can purchase online. Portable is a good option  We can use a mail order if need DirectRx  Okay to use the Lasix  20mg  x 2 = 40mg  for 3 days short term if need for swelling.  Please schedule a Follow-up Appointment to: Return in about 4 months (around 01/04/2024) for 4 month follow-up DM A1c, COPD Dyspnea, Heme/Onc updates.  If you have any other questions or concerns, please feel free to call the office or send a message through MyChart. You may also schedule an earlier appointment if necessary.  Additionally, you may be receiving a survey about your experience at our office within a few days to 1 week by e-mail or mail. We value your feedback.  Domingo Friend, DO [facility], [place]

## 2023-09-04 NOTE — Progress Notes (Signed)
 Subjective:    Patient ID: Elizabeth [name], female    DOB: [birth year], 73 y.o.   MRN: [id number]  Elizabeth [name] is a 73 y.o. female presenting on 09/04/2023 for Annual Exam, Diabetes, and COPD   HPI  Discussed the use of AI scribe software for clinical note transcription with the patient, who gave verbal consent to proceed.  History of Present Illness   [name] "Raynelle Callow" is a 73 year old female who presents for an annual physical exam.   She has a history of vision loss following a stroke and is followed by [facility]. She has not had a recent eye exam and plans to schedule one. She is concerned about potential diabetic changes in her eyes.     Hyperlipidemia Her lipid panel shows an LDL cholesterol of 104 mg/dL. She was recently switched to rosuvastatin by her cardiologist and is concerned about the strength of her new medication compared to her previous one.  She experiences persistent wheezing and shortness of breath, which have worsened over time. Significant fatigue and weakness are present, and she 'can't even walk to the front door without gasping.' She has a history of COPD, and any respiratory infection tends to exacerbate her symptoms. She is currently on oxygen at night and is scheduled for a walking test at [facility] for oxygen evaluation. No recent fever, and her last antibiotic treatment was about a month ago, which temporarily improved her symptoms. -Upcoming apt with [facility] Dr Marene Shape for COPD -Prior work up cardiac 2023-2024 with CT imaging and further eval negative for significant ischemic cardiac disease   Weight Loss She mentions a recent weight loss of ten pounds over the past three months without trying, which she finds concerning. She attributes some of her symptoms to a possible sinus infection that she feels has moved to her lungs, causing her to cough up phlegm. - Reduced oral intake lately  COPD / Chronic Bronchitis Dyspnea Followed by [facility] She has  a history of COPD and uses a nebulizer machine for quick relief. She feels tired and weak, attributing these symptoms to her COPD. She is on oxygen at night but not during the day and is interested in exploring additional treatments to improve her breathing. Dyspnea during day persistent. - On Spiriva  daily - On Symbicort TWICE A DAY - Albuterol  neb AS NEEDED   Type 2 Diabetes Newer diagnosis.  She has been managing her diabetes through dietary changes, specifically by eliminating certain foods. Her current blood sugar level is 126 mg/dL, and her W0J is 8.1%. She is not on any diabetes medication due to intolerance to metformin  and concerns about other medications. She is interested in starting a new medication to help manage her diabetes and improve her energy levels.  Anemia, normocytic Followed by Hematology Last anemia panel did not show low range with iron deficiency She has a history of low ferritin levels and feels tired, which she attributes to this. Her hemoglobin is 11.8 g/dL, and her iron levels are within normal range, but she remains concerned about her energy levels. She has Hematology apt 01/2024  Health Maintenance:  Last Mammogram 10/2021 negative. She declines repeat.  She is due for an upgraded pneumonia vaccine, which she is interested in receiving. She last received a pneumonia vaccine in 2018.      09/04/2023    8:20 AM 06/24/2023    1:19 PM 03/25/2023    9:18 AM  Depression screen PHQ 2/9  Decreased Interest 0  0 0  Down, Depressed, Hopeless 0 0 0  PHQ - 2 Score 0 0 0  Altered sleeping 0    Tired, decreased energy 0    Change in appetite 0    Feeling bad or failure about yourself  0    Trouble concentrating 0    Moving slowly or fidgety/restless 0    Suicidal thoughts 0    PHQ-9 Score 0    Difficult doing work/chores Not difficult at all         09/04/2023    8:20 AM 06/24/2023    1:19 PM 03/25/2023    9:18 AM 09/17/2022    9:47 AM  GAD 7 : Generalized  Anxiety Score  Nervous, Anxious, on Edge 0  0 3  Control/stop worrying 0  0 2  Worry too much - different things 0  0 1  Trouble relaxing 0  0 1  Restless 0  0 0  Easily annoyed or irritable 0  0 3  Afraid - awful might happen 0  0 0  Total GAD 7 Score 0  0 10  Anxiety Difficulty Not difficult at all Not difficult at all       Past Medical History:  Diagnosis Date   Allergy    Anxiety    B12 deficiency    Carpal tunnel syndrome    Centrilobular emphysema (HCC)    COPD (chronic obstructive pulmonary disease) (HCC)    Depression    Genetic testing    Common Cancers panel (47 genes) @ [facility] - No pathogenic mutations detected   HLD (hyperlipidemia)    Hyperlipidemia    Hypertension    Iron deficiency anemia 10/16/2016   Lipoma of skin    Lung nodule    Normocytic anemia 10/16/2016   QT prolongation    Substance abuse (HCC)    Past Surgical History:  Procedure Laterality Date   ABDOMINAL HYSTERECTOMY     BREAST EXCISIONAL BIOPSY Left    1980's neg   CESAREAN SECTION     TUBAL LIGATION     Social History   Socioeconomic History   Marital status: Divorced    Spouse name: Not on file   Number of children: 1   Years of education: Not on file   Highest education level: Associate degree: academic program  Occupational History   Occupation: Retired  Tobacco Use   Smoking status: Every Day    Current packs/day: 0.50    Average packs/day: 0.5 packs/day for 52.0 years (26.0 ttl pk-yrs)    Types: Cigarettes   Smokeless tobacco: Never   Tobacco comments:    5 ciggarette a day--03/02/2020  Vaping Use   Vaping status: Some Days  Substance and Sexual Activity   Alcohol use: No   Drug use: No   Sexual activity: Not Currently  Other Topics Concern   Not on file  Social History Narrative   Not on file   Social Drivers of Health   Financial Resource Strain: Low Risk  (03/22/2023)   Overall Financial Resource Strain (CARDIA)    Difficulty of Paying Living Expenses:  Not hard at all  Food Insecurity: No Food Insecurity (03/22/2023)   Hunger Vital Sign    Worried About Running Out of Food in the Last Year: Never true    Ran Out of Food in the Last Year: Never true  Transportation Needs: No Transportation Needs (03/22/2023)   PRAPARE - Administrator, Civil Service (Medical): No  Lack of Transportation (Non-Medical): No  Physical Activity: Inactive (03/22/2023)   Exercise Vital Sign    Days of Exercise per Week: 0 days    Minutes of Exercise per Session: 0 min  Stress: No Stress Concern Present (03/22/2023)   [employer] of Occupational Health - Occupational Stress Questionnaire    Feeling of Stress : Not at all  Social Connections: Moderately Integrated (03/22/2023)   Social Connection and Isolation Panel [NHANES]    Frequency of Communication with Friends and Family: Once a week    Frequency of Social Gatherings with Friends and Family: More than three times a week    Attends Religious Services: More than 4 times per year    Active Member of [employer] or Organizations: Yes    Attends Banker Meetings: Never    Marital Status: Divorced  Catering manager Violence: Not At Risk (09/13/2022)   Humiliation, Afraid, Rape, and Kick questionnaire    Fear of Current or Ex-Partner: No    Emotionally Abused: No    Physically Abused: No    Sexually Abused: No   Family History  Problem Relation Age of Onset   Ovarian cancer Mother 32       deceased 62   Breast cancer Mother 64   Stroke Father    Lung cancer Paternal Aunt    Lung cancer Cousin    Current Outpatient Medications on File Prior to Visit  Medication Sig   albuterol  (VENTOLIN  HFA) 108 (90 Base) MCG/ACT inhaler Inhale 2 puffs into the lungs every 6 (six) hours as needed for wheezing or shortness of breath.   ARIPiprazole  (ABILIFY ) 5 MG tablet Take 1 tablet by mouth once daily   cyanocobalamin  (VITAMIN B12) 1000 MCG/ML injection Inject 1 mL (1,000 mcg total) into  the muscle every 30 (thirty) days.   fluticasone (FLONASE) 50 MCG/ACT nasal spray Place 2 sprays into both nostrils daily.   furosemide  (LASIX ) 20 MG tablet Take 1 tablet by mouth once daily   losartan  (COZAAR ) 50 MG tablet Take 1 tablet by mouth once daily   magnesium  oxide (MAG-OX) 400 MG tablet Take 1 tablet (400 mg total) by mouth daily.   Multiple Vitamin (MULTI-VITAMINS) TABS Take by mouth.   omeprazole  (PRILOSEC) 20 MG capsule TAKE 1 CAPSULE BY MOUTH ONCE DAILY BEFORE BREAKFAST   potassium chloride  SA (KLOR-CON  M) 20 MEQ tablet Take 1 tablet by mouth once daily   rosuvastatin (CRESTOR) 40 MG tablet Take 40 mg by mouth daily.   SPIRIVA  HANDIHALER 18 MCG inhalation capsule Place 1 capsule (18 mcg total) into inhaler and inhale daily.   SYMBICORT 160-4.5 MCG/ACT inhaler Inhale 2 puffs into the lungs.   venlafaxine  XR (EFFEXOR -XR) 75 MG 24 hr capsule TAKE 3 CAPSULES BY MOUTH ONCE DAILY WITH BREAKFAST   OHTUVAYRE  3 MG/2.5ML SUSP Take 3 mg by nebulization in the morning and at bedtime.   No current facility-administered medications on file prior to visit.    Review of Systems Per HPI unless specifically indicated above     Objective:     BP 122/74 (BP Location: Right Arm, Patient Position: Sitting, Cuff Size: Normal)   Pulse 84   Ht 5\' 2"  (1.575 m)   Wt 182 lb 8 oz (82.8 kg)   SpO2 97%   BMI 33.38 kg/m   Wt Readings from Last 3 Encounters:  09/04/23 182 lb 8 oz (82.8 kg)  06/24/23 187 lb (84.8 kg)  03/25/23 196 lb (88.9 kg)    Physical Exam  Vitals and nursing note reviewed.  Constitutional:      General: She is not in acute distress.    Appearance: She is well-developed. She is not diaphoretic.     Comments: Well-appearing, comfortable, cooperative  HENT:     Head: Normocephalic and atraumatic.  Eyes:     General:        Right eye: No discharge.        Left eye: No discharge.     Conjunctiva/sclera: Conjunctivae normal.  Neck:     Thyroid : No thyromegaly.   Cardiovascular:     Rate and Rhythm: Normal rate and regular rhythm.     Heart sounds: Normal heart sounds. No murmur heard. Pulmonary:     Effort: Pulmonary effort is normal. No respiratory distress.     Breath sounds: Normal breath sounds. No wheezing or rales.     Comments: Reduced air movement general. No focal abnormality Musculoskeletal:        General: Normal range of motion.     Cervical back: Normal range of motion and neck supple.     Right lower leg: No edema.     Left lower leg: No edema.     Comments: Some varicose veins without edema.  Lymphadenopathy:     Cervical: No cervical adenopathy.  Skin:    General: Skin is warm and dry.     Findings: No erythema or rash.  Neurological:     Mental Status: She is alert and oriented to person, place, and time.  Psychiatric:        Behavior: Behavior normal.     Comments: Well groomed, good eye contact, normal speech and thoughts      Diabetic Foot Exam - Simple   Simple Foot Form Diabetic Foot exam was performed with the following findings: Yes 09/04/2023  9:03 AM  Visual Inspection No deformities, no ulcerations, no other skin breakdown bilaterally: Yes Sensation Testing Intact to touch and monofilament testing bilaterally: Yes Pulse Check Posterior Tibialis and Dorsalis pulse intact bilaterally: Yes Comments       Results for orders placed or performed in visit on 08/27/23  Iron, TIBC and Ferritin Panel   Collection Time: 08/27/23  8:29 AM  Result Value Ref Range   Iron 67 45 - 160 mcg/dL   TIBC 161 096 - 045 mcg/dL (calc)   %SAT 19 16 - 45 % (calc)   Ferritin 20 16 - 288 ng/mL  VITAMIN D  25 Hydroxy (Vit-D Deficiency, Fractures)   Collection Time: 08/27/23  8:29 AM  Result Value Ref Range   Vit D, 25-Hydroxy 31 30 - 100 ng/mL  Vitamin B12   Collection Time: 08/27/23  8:29 AM  Result Value Ref Range   Vitamin B-12 642 200 - 1,100 pg/mL  TSH   Collection Time: 08/27/23  8:29 AM  Result Value Ref Range    TSH 1.31 0.40 - 4.50 mIU/L  Microalbumin / creatinine urine ratio   Collection Time: 08/27/23  8:29 AM  Result Value Ref Range   Creatinine, Urine 229 20 - 275 mg/dL   Microalb, Ur 40.9 mg/dL   Microalb Creat Ratio 246 (H) <30 mg/g creat  CBC with Differential/Platelet   Collection Time: 08/27/23  8:29 AM  Result Value Ref Range   WBC 7.3 3.8 - 10.8 Thousand/uL   RBC 4.38 3.80 - 5.10 Million/uL   Hemoglobin 11.8 11.7 - 15.5 g/dL   HCT 81.1 91.4 - 78.2 %   MCV 84.5 80.0 - 100.0 fL  MCH 26.9 (L) 27.0 - 33.0 pg   MCHC 31.9 (L) 32.0 - 36.0 g/dL   RDW 62.9 52.8 - 41.3 %   Platelets 287 140 - 400 Thousand/uL   MPV 10.2 7.5 - 12.5 fL   Neutro Abs 4,125 1,500 - 7,800 cells/uL   Absolute Lymphocytes 2,643 850 - 3,900 cells/uL   Absolute Monocytes 518 200 - 950 cells/uL   Eosinophils Absolute 7 (L) 15 - 500 cells/uL   Basophils Absolute 7 0 - 200 cells/uL   Neutrophils Relative % 56.5 %   Total Lymphocyte 36.2 %   Monocytes Relative 7.1 %   Eosinophils Relative 0.1 %   Basophils Relative 0.1 %  Hemoglobin A1c   Collection Time: 08/27/23  8:29 AM  Result Value Ref Range   Hgb A1c MFr Bld 7.1 (H) <5.7 %   Mean Plasma Glucose 157 mg/dL   eAG (mmol/L) 8.7 mmol/L  Lipid panel   Collection Time: 08/27/23  8:29 AM  Result Value Ref Range   Cholesterol 190 <200 mg/dL   HDL 62 > OR = 50 mg/dL   Triglycerides 244 <010 mg/dL   LDL Cholesterol (Calc) 104 (H) mg/dL (calc)   Total CHOL/HDL Ratio 3.1 <5.0 (calc)   Non-HDL Cholesterol (Calc) 128 <130 mg/dL (calc)  COMPLETE METABOLIC PANEL WITHOUT GFR   Collection Time: 08/27/23  8:29 AM  Result Value Ref Range   Glucose, Bld 126 (H) 65 - 99 mg/dL   BUN 9 7 - 25 mg/dL   Creat 2.72 5.36 - 6.44 mg/dL   BUN/Creatinine Ratio SEE NOTE: 6 - 22 (calc)   Sodium 143 135 - 146 mmol/L   Potassium 4.0 3.5 - 5.3 mmol/L   Chloride 103 98 - 110 mmol/L   CO2 30 20 - 32 mmol/L   Calcium 9.2 8.6 - 10.4 mg/dL   Total Protein 6.7 6.1 - 8.1 g/dL    Albumin 4.0 3.6 - 5.1 g/dL   Globulin 2.7 1.9 - 3.7 g/dL (calc)   AG Ratio 1.5 1.0 - 2.5 (calc)   Total Bilirubin 0.5 0.2 - 1.2 mg/dL   Alkaline phosphatase (APISO) 104 37 - 153 U/L   AST 35 10 - 35 U/L   ALT 18 6 - 29 U/L      Assessment & Plan:   Problem List Items Addressed This Visit     Mixed hyperlipidemia   Relevant Medications   rosuvastatin (CRESTOR) 40 MG tablet   Shortness of breath   Relevant Medications   OHTUVAYRE  3 MG/2.5ML SUSP   Type 2 diabetes mellitus with other specified complication (HCC)   Relevant Medications   rosuvastatin (CRESTOR) 40 MG tablet   RYBELSUS 3 MG TABS   Other Visit Diagnoses       Annual physical exam    -  Primary     Simple chronic bronchitis (HCC)       Relevant Medications   OHTUVAYRE  3 MG/2.5ML SUSP     Vitamin D  deficiency         Need for Streptococcus pneumoniae vaccination       Relevant Orders   Pneumococcal conjugate vaccine 20-valent (Completed)     Long term current use of oral hypoglycemic drug            Updated Health Maintenance information Reviewed recent lab results with patient Encouraged improvement to lifestyle with diet and exercise Goal of weight loss   Type 2 Diabetes Mellitus Blood glucose 126 mg/dL, I3K 7.4%. Interested in medication for fatigue  and glycemic control. Discussed Rybelsus as an alternative to injectables, addressing concerns about allergies and side effects. - Provide 30-day free trial of Rybelsus. - Evaluate response in 2-3 weeks, consider higher dose if tolerated for Rybelsus 7mg  next order  COPD (Chronic Obstructive Pulmonary Disease) / Chronic Bronchitis  Followed by [facility] Fatigue and dyspnea possibly related to COPD. Uses daily maintenance inhalers Spiriva  (LAMA) and Symbicort (ICS LABA) and Albuterol  nebulizer and nocturnal oxygen.  Discussed Ohtuvayre  for improved breathing / dyspnea. Prescribe Ohtuvayre  nebulizer solution twice daily, cancelled through local pharmacy,  will send through [employer] program specialty pharmacy - Continue current inhalers and nebulizer treatments. - Discuss obtaining a new nebulizer machine through this order  Hyperlipidemia LDL 104 mg/dL, close to target. On rosuvastatin 40mg , adjusted by cardiologist, effective at lower dosage compared to previous Simvastatin  higher dose.  Iron Deficiency Hemoglobin 11.8 g/dL, ferritin 20, still normal range, but lower compared to past readings. Fatigue possibly related to iron levels. Monitoring advised. - Monitor iron levels, re-evaluate after hematologist appointment in 4 months. As scheduled w/ Hematology - Consider rechecking ferritin if symptoms persist or worsen.  General Health Maintenance Due for pneumonia vaccine upgrade. No mammogram since August 2023, not interested in further mammograms. Eye exam due for diabetic monitoring. - Administer Prevnar 20 vaccine. - Encourage scheduling routine eye exam at [facility].  Follow-up Follow-up with hematologist in 4 months, lab work prior. Follow-up with family practice 4 months after hematologist visit. - Schedule follow-up appointment 4 months after hematologist visit. - Ensure lab work completed before hematologist appointment.      Print everything note and demographic, insurance card fax to [facility]  Specialty pharmacy program.Fax [phone]    Orders Placed This Encounter  Procedures   Pneumococcal conjugate vaccine 20-valent    Meds ordered this encounter  Medications   RYBELSUS 3 MG TABS    Sig: Take 1 tablet (3 mg total) by mouth daily.   DISCONTD: OHTUVAYRE  3 MG/2.5ML SUSP    Sig: Take 3 mg by nebulization in the morning and at bedtime.    Dispense:  150 mL    Refill:  2     Follow up plan: Return in about 4 months (around 01/04/2024) for 4 month follow-up DM A1c, COPD Dyspnea, Heme/Onc updates.  Domingo Friend, DO [facility] 09/04/2023, 8:34  AM

## 2023-09-06 ENCOUNTER — Telehealth: Payer: Self-pay | Admitting: Pharmacy Technician

## 2023-09-06 ENCOUNTER — Other Ambulatory Visit (HOSPITAL_COMMUNITY): Payer: Self-pay

## 2023-09-06 NOTE — Telephone Encounter (Signed)
 Pharmacy Patient Advocate Encounter   Received notification from CoverMyMeds that prior authorization for Ohtuvayre  3mg /2.67ml nebs is required/requested.   Insurance verification completed.   The patient is insured through [employer] .   Per test claim: PA required; PA submitted to above mentioned insurance via CoverMyMeds Key/confirmation #/EOC [id number] Status is pending

## 2023-09-08 ENCOUNTER — Other Ambulatory Visit: Payer: Self-pay | Admitting: Family Medicine

## 2023-09-08 DIAGNOSIS — K219 Gastro-esophageal reflux disease without esophagitis: Secondary | ICD-10-CM

## 2023-09-09 ENCOUNTER — Other Ambulatory Visit: Payer: Self-pay | Admitting: Family Medicine

## 2023-09-09 DIAGNOSIS — I10 Essential (primary) hypertension: Secondary | ICD-10-CM

## 2023-09-09 NOTE — Telephone Encounter (Signed)
 Pharmacy Patient Advocate Encounter  Received notification from [employer] that Prior Authorization for Ohhtuvayre 3mg /2.21ml has been APPROVED from 04/03/23 to 04/01/24. Approved under Medicare part B benefit.   PA #/Case ID/Reference #: (Key: [id number]) PA Case ID #: [id number]

## 2023-09-10 NOTE — Telephone Encounter (Signed)
 Requested Prescriptions  Pending Prescriptions Disp Refills   omeprazole  (PRILOSEC) 20 MG capsule [Pharmacy Med Name: Omeprazole  20 MG Oral Capsule Delayed Release] 90 capsule 0    Sig: TAKE 1 CAPSULE BY MOUTH ONCE DAILY BEFORE BREAKFAST     Gastroenterology: Proton Pump Inhibitors Passed - 09/10/2023  8:41 AM      Passed - Valid encounter within last 12 months    Recent Outpatient Visits           6 days ago Annual physical exam   [place] [facility] [place], Kayleen Party, DO   2 months ago Type 2 diabetes mellitus with other specified complication, without long-term current use of insulin [facility])   [place] [facility] [place], Kayleen Party, [place]

## 2023-09-10 NOTE — Telephone Encounter (Signed)
 Requested Prescriptions  Pending Prescriptions Disp Refills   potassium chloride  SA (KLOR-CON  M) 20 MEQ tablet [Pharmacy Med Name: Potassium Chloride  Crys ER 20 MEQ Oral Tablet Extended Release] 90 tablet 0    Sig: Take 1 tablet by mouth once daily     Endocrinology:  Minerals - Potassium Supplementation Passed - 09/10/2023 10:30 AM      Passed - K in normal range and within 360 days    Potassium  Date Value Ref Range Status  08/27/2023 4.0 3.5 - 5.3 mmol/L Final  05/06/2014 3.5 3.5 - 5.1 mmol/L Final         Passed - Cr in normal range and within 360 days    Creat  Date Value Ref Range Status  08/27/2023 0.81 0.60 - 1.00 mg/dL Final   Creatinine, Urine  Date Value Ref Range Status  08/27/2023 229 20 - 275 mg/dL Final         Passed - Valid encounter within last 12 months    Recent Outpatient Visits           6 days ago Annual physical exam   [place] [facility] Raina Bunting, DO   2 months ago Type 2 diabetes mellitus with other specified complication, without long-term current use of insulin [facility])   [place] [facility] [place], Kayleen Party, [place]

## 2023-09-19 ENCOUNTER — Ambulatory Visit: Payer: Medicare PPO

## 2023-09-23 ENCOUNTER — Telehealth: Admitting: Physician Assistant

## 2023-09-23 NOTE — Progress Notes (Addendum)
  Virtual Visit Consent   Elizabeth [name], you are scheduled for a virtual visit with a [facility] provider today. Just as with appointments in the office, your consent must be obtained to participate. Your consent will be active for this visit and any virtual visit you may have with one of our providers in the next 365 days. If you have a MyChart account, a copy of this consent can be sent to you electronically.  As this is a virtual visit, video technology does not allow for your provider to perform a traditional examination. This may limit your provider's ability to fully assess your condition. If your provider identifies any concerns that need to be evaluated in person or the need to arrange testing (such as labs, EKG, etc.), we will make arrangements to do so. Although advances in technology are sophisticated, we cannot ensure that it will always work on either your end or our end. If the connection with a video visit is poor, the visit may have to be switched to a telephone visit. With either a video or telephone visit, we are not always able to ensure that we have a secure connection.  By engaging in this virtual visit, you consent to the provision of healthcare and authorize for your insurance to be billed (if applicable) for the services provided during this visit. Depending on your insurance coverage, you may receive a charge related to this service.  I need to obtain your verbal consent now. Are you willing to proceed with your visit today? Franceska Strahm has provided verbal consent on 09/23/2023 for a virtual visit (video or telephone). Lovette Borg, PA-C  Date: 09/23/2023 4:47 PM   Virtual Visit via Video Note     Location: Patient:  Provider: Virtual Visit Location Provider: Home Office   History of Present Illness:  Pt did not present for a telehealth video visit at her scheduled time. Sent direct link via text and email.    Assessment and Plan: There are no diagnoses linked  to this encounter.       Malaisha Silliman, PA-C

## 2023-09-24 ENCOUNTER — Telehealth: Admitting: Physician Assistant

## 2023-09-24 ENCOUNTER — Other Ambulatory Visit

## 2023-09-24 DIAGNOSIS — J441 Chronic obstructive pulmonary disease with (acute) exacerbation: Secondary | ICD-10-CM | POA: Diagnosis not present

## 2023-09-24 MED ORDER — DOXYCYCLINE HYCLATE 100 MG PO TABS: 100.0000 mg | ORAL_TABLET | Freq: Two times a day (BID) | ORAL | 0 refills | Status: DC

## 2023-09-24 MED ORDER — BENZONATATE 100 MG PO CAPS: 100.0000 mg | ORAL_CAPSULE | Freq: Three times a day (TID) | ORAL | 0 refills | Status: DC | PRN

## 2023-09-24 MED ORDER — PREDNISONE 20 MG PO TABS: 40.0000 mg | ORAL_TABLET | Freq: Every day | ORAL | 0 refills | Status: DC

## 2023-09-24 NOTE — Progress Notes (Signed)
 Virtual Visit Consent   Elizabeth [name], you are scheduled for a virtual visit with a [facility] provider today. Just as with appointments in the office, your consent must be obtained to participate. Your consent will be active for this visit and any virtual visit you may have with one of our providers in the next 365 days. If you have a MyChart account, a copy of this consent can be sent to you electronically.  As this is a virtual visit, video technology does not allow for your provider to perform a traditional examination. This may limit your provider's ability to fully assess your condition. If your provider identifies any concerns that need to be evaluated in person or the need to arrange testing (such as labs, EKG, etc.), we will make arrangements to do so. Although advances in technology are sophisticated, we cannot ensure that it will always work on either your end or our end. If the connection with a video visit is poor, the visit may have to be switched to a telephone visit. With either a video or telephone visit, we are not always able to ensure that we have a secure connection.  By engaging in this virtual visit, you consent to the provision of healthcare and authorize for your insurance to be billed (if applicable) for the services provided during this visit. Depending on your insurance coverage, you may receive a charge related to this service.  I need to obtain your verbal consent now. Are you willing to proceed with your visit today? Elizabeth [name] has provided verbal consent on 09/24/2023 for a virtual visit (video or telephone). Elizabeth [name], [place]  Date: 09/24/2023 12:47 PM   Virtual Visit via Video Note   I, Elizabeth [name], connected with  Elizabeth [name]  ([id number], 1951-03-14) on 09/24/23 at 12:45 PM EDT by a video-enabled telemedicine application and verified that I am speaking with the correct person using two identifiers.  Location: Patient: Virtual Visit Location  Patient: Home Provider: Virtual Visit Location Provider: Home Office   I discussed the limitations of evaluation and management by telemedicine and the availability of in person appointments. The patient expressed understanding and agreed to proceed.    History of Present Illness: Elizabeth [name] is a 73 y.o. who identifies as a female who was assigned female at birth, and is being seen today for URI symptoms starting about a week or so ago, initially with sinus congestion, sinus pressure, now moved down into her lungs with chest congestion, productive cough and increased wheeze and chest tightness. Notes intermittent low-grade fever. Pulse ox has been reading at 96% on RA.  Still using her CPAP at nighttime.   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 03/11/2018   Senile nuclear sclerosis, left 03/11/2018   Substance abuse (HCC)    Genetic testing    Pernicious anemia 11/27/2016   Normocytic anemia 10/16/2016   Iron deficiency anemia 10/16/2016   Centrilobular emphysema (HCC)    Major depressive disorder, recurrent, moderate (HCC) 09/24/2016   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   Bilateral carpal tunnel syndrome 05/27/2015   Bilateral hand numbness 05/17/2015   Fracture  of humerus, proximal, closed 03/23/2013   Lipoma of skin and subcutaneous tissue 12/26/2011   B12 deficiency 02/15/2011   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   Dyspepsia and other specified disorders of function of stomach 10/11/2010   Benign essential hypertension 10/11/2010   Mixed hyperlipidemia 10/11/2010   Tobacco use disorder 10/11/2010   Anxiety state  08/25/2009   Shortness of breath 07/24/2006    Allergies:  Allergies  Allergen Reactions   Moxifloxacin Itching, Other (See Comments), Photosensitivity, Rash and Swelling    Causes swelling, redness, burning in eyes    Levofloxacin Other (See Comments)    Other reaction(s): Muscle Pain Other reaction(s): Muscle Pain   Medications:  Current Outpatient Medications:    benzonatate (TESSALON) 100 MG capsule, Take 1 capsule (100 mg total) by mouth 3 (three) times daily as needed for cough., Disp: 30 capsule, Rfl: 0   doxycycline  (VIBRA -TABS) 100 MG tablet, Take 1 tablet (100 mg total) by mouth 2 (two) times daily., Disp: 14 tablet, Rfl: 0   predniSONE  (DELTASONE ) 20 MG tablet, Take 2 tablets (40 mg total) by mouth daily with breakfast., Disp: 10 tablet, Rfl: 0   albuterol  (VENTOLIN  HFA) 108 (90 Base) MCG/ACT inhaler, Inhale 2 puffs into the lungs every 6 (six) hours as needed for wheezing or shortness of breath., Disp: 8 g, Rfl: 5   ARIPiprazole  (ABILIFY ) 5 MG tablet, Take 1 tablet by mouth once daily, Disp: 90 tablet, Rfl: 1   cyanocobalamin  (VITAMIN B12) 1000 MCG/ML injection, Inject 1 mL (1,000 mcg total) into the muscle every 30 (thirty) days., Disp: 3 mL, Rfl: 1   fluticasone (FLONASE) 50 MCG/ACT nasal spray, Place 2 sprays into both nostrils daily., Disp: , Rfl: 1   furosemide  (LASIX ) 20 MG tablet, Take 1 tablet by mouth once daily, Disp: 90 tablet, Rfl: 0   losartan  (COZAAR ) 50 MG tablet, Take 1 tablet by mouth once daily, Disp: 90 tablet, Rfl: 0   magnesium  oxide (MAG-OX) 400 MG tablet, Take 1 tablet (400 mg total) by mouth daily., Disp: 90 tablet, Rfl: 3   Multiple Vitamin (MULTI-VITAMINS) TABS, Take by mouth., Disp: , Rfl:    OHTUVAYRE  3 MG/2.5ML SUSP, Take 3 mg by nebulization in the morning and at bedtime., Disp: , Rfl:    omeprazole  (PRILOSEC) 20 MG capsule, TAKE 1 CAPSULE BY MOUTH ONCE DAILY BEFORE BREAKFAST, Disp: 90 capsule, Rfl: 0   potassium chloride  SA (KLOR-CON  M) 20 MEQ  tablet, Take 1 tablet by mouth once daily, Disp: 90 tablet, Rfl: 0   rosuvastatin (CRESTOR) 40 MG tablet, Take 40 mg by mouth daily., Disp: , Rfl:    RYBELSUS  3 MG TABS, Take 1 tablet (3 mg total) by mouth daily., Disp: , Rfl:    SPIRIVA  HANDIHALER 18 MCG inhalation capsule, Place 1 capsule (18 mcg total) into inhaler and inhale daily., Disp: 90 capsule, Rfl: 3   SYMBICORT 160-4.5 MCG/ACT inhaler, Inhale 2 puffs into the lungs., Disp: , Rfl:    venlafaxine  XR (EFFEXOR -XR) 75 MG 24 hr capsule, TAKE 3 CAPSULES BY MOUTH ONCE DAILY WITH BREAKFAST, Disp: 270 capsule, Rfl: 0  Observations/Objective: Patient is well-developed, well-nourished in no acute distress.  Resting comfortably at home.  Head is normocephalic, atraumatic.  No labored breathing. Speech is clear and coherent with logical content.  Patient is alert and oriented at baseline.   Assessment and Plan: 1. COPD exacerbation (HCC) (Primary) - benzonatate (TESSALON) 100 MG capsule; Take 1 capsule (100 mg total) by mouth 3 (three) times  daily as needed for cough.  Dispense: 30 capsule; Refill: 0 - predniSONE  (DELTASONE ) 20 MG tablet; Take 2 tablets (40 mg total) by mouth daily with breakfast.  Dispense: 10 tablet; Refill: 0 - doxycycline  (VIBRA -TABS) 100 MG tablet; Take 1 tablet (100 mg total) by mouth 2 (two) times daily.  Dispense: 14 tablet; Refill: 0  Rx Prednisone  and Doxycycline  per orders.  Increase fluids.  Rest.  Saline nasal spray.  Probiotic.  Mucinex as directed.  Humidifier in bedroom. Tessalon per orders.  Continue close watch on O2 status. If < 88 or any worsening symptoms despite treatment, needs in-person evaluation ASAP.   Follow Up Instructions: I discussed the assessment and treatment plan with the patient. The patient was provided an opportunity to ask questions and all were answered. The patient agreed with the plan and demonstrated an understanding of the instructions.  A copy of instructions were sent to the  patient via MyChart unless otherwise noted below.   The patient was advised to call back or seek an in-person evaluation if the symptoms worsen or if the condition fails to improve as anticipated.    Elizabeth Velma Lunger, PA-C

## 2023-09-24 NOTE — Patient Instructions (Signed)
 [name], thank you for joining [name], PA-C for today's virtual visit.  While this provider is not your primary care provider (PCP), if your PCP is located in our provider database this encounter information will be shared with them immediately following your visit.   A [facility] MyChart account gives you access to today's visit and all your visits, tests, and labs performed at [facility]  click here if you don't have a [facility] MyChart account or go to [email or web address]  Consent: (Patient) Elizabeth [name] provided verbal consent for this virtual visit at the beginning of the encounter.  Current Medications:  Current Outpatient Medications:    albuterol  (VENTOLIN  HFA) 108 (90 Base) MCG/ACT inhaler, Inhale 2 puffs into the lungs every 6 (six) hours as needed for wheezing or shortness of breath., Disp: 8 g, Rfl: 5   ARIPiprazole  (ABILIFY ) 5 MG tablet, Take 1 tablet by mouth once daily, Disp: 90 tablet, Rfl: 1   cyanocobalamin  (VITAMIN B12) 1000 MCG/ML injection, Inject 1 mL (1,000 mcg total) into the muscle every 30 (thirty) days., Disp: 3 mL, Rfl: 1   fluticasone (FLONASE) 50 MCG/ACT nasal spray, Place 2 sprays into both nostrils daily., Disp: , Rfl: 1   furosemide  (LASIX ) 20 MG tablet, Take 1 tablet by mouth once daily, Disp: 90 tablet, Rfl: 0   losartan  (COZAAR ) 50 MG tablet, Take 1 tablet by mouth once daily, Disp: 90 tablet, Rfl: 0   magnesium  oxide (MAG-OX) 400 MG tablet, Take 1 tablet (400 mg total) by mouth daily., Disp: 90 tablet, Rfl: 3   Multiple Vitamin (MULTI-VITAMINS) TABS, Take by mouth., Disp: , Rfl:    OHTUVAYRE  3 MG/2.5ML SUSP, Take 3 mg by nebulization in the morning and at bedtime., Disp: , Rfl:    omeprazole  (PRILOSEC) 20 MG capsule, TAKE 1 CAPSULE BY MOUTH ONCE DAILY BEFORE BREAKFAST, Disp: 90 capsule, Rfl: 0   potassium chloride  SA (KLOR-CON  M) 20 MEQ tablet, Take 1 tablet by mouth once daily, Disp: 90 tablet, Rfl: 0    rosuvastatin (CRESTOR) 40 MG tablet, Take 40 mg by mouth daily., Disp: , Rfl:    RYBELSUS  3 MG TABS, Take 1 tablet (3 mg total) by mouth daily., Disp: , Rfl:    SPIRIVA  HANDIHALER 18 MCG inhalation capsule, Place 1 capsule (18 mcg total) into inhaler and inhale daily., Disp: 90 capsule, Rfl: 3   SYMBICORT 160-4.5 MCG/ACT inhaler, Inhale 2 puffs into the lungs., Disp: , Rfl:    venlafaxine  XR (EFFEXOR -XR) 75 MG 24 hr capsule, TAKE 3 CAPSULES BY MOUTH ONCE DAILY WITH BREAKFAST, Disp: 270 capsule, Rfl: 0   Medications ordered in this encounter:  No orders of the defined types were placed in this encounter.    *If you need refills on other medications prior to your next appointment, please contact your pharmacy*  Follow-Up: Call back or seek an in-person evaluation if the symptoms worsen or if the condition fails to improve as anticipated.  [facility] [phone]  Other Instructions Chronic Obstructive Pulmonary Disease Exacerbation  Chronic obstructive pulmonary disease (COPD) is a long-term (chronic) lung problem. When you have COPD, it can feel harder to breathe in or out. COPD exacerbation is a flare-up of symptoms when breathing gets worse and more treatment may be needed. Without treatment, flare-ups can be life-threatening. If they happen often, your lungs can become more damaged. What are the causes? Not taking your usual COPD medicines as told by your health care provider. A cold or  the flu, which can cause infection in your lungs. Being exposed to things that make your breathing worse, such as: Smoke. Air pollution. Fumes. Dust. Allergies. Weather changes. What are the signs or symptoms? Symptoms do not get better or get worse even if you take your medicines as told by your provider. Symptoms may include: More shortness of breath. You may only be able to speak one or two words at a time. More coughing or mucus from your lungs. More wheezing or chest  tightness. Being more tired and having less energy. Confusion. How is this diagnosed? This condition is diagnosed based on: Symptoms that get worse. Your medical history. A physical exam. You may also have tests, including: A chest X-ray. Blood or mucus tests. How is this treated? You may be able to stay home or you may need to go to the hospital. Treatment may include: Taking medicines. These may include: Inhalers. These have medicines in them that you breathe in. These may be more of what you already take or they may be new. Steroids. These reduce inflammation in the airways. These may be inhaled, taken by mouth, or given in an IV. Antibiotics. These treat infection. Using oxygen. Using a device to help you clear mucus. Follow these instructions at home: Medicines Take your medicines only as told by your provider. If you were given antibiotics or steroids, take them as told by your provider. Do not stop taking them even if you start to feel better. Lifestyle Several times a day, wash your hands with soap and water for at least 20 seconds. If you cannot use soap and water, use hand sanitizer. This may help keep you from getting an infection. Avoid being around crowds or people who are sick. Do not smoke or use any products that contain nicotine or tobacco. If you need help quitting, ask your provider. Return to your normal activities when your provider says that it's safe. Use breathing methods to control your stress and catch your breath. How is this prevented? Follow your COPD action plan. The action plan tells you what to do if you're feeling good and what to do when you start feeling worse. Discuss the plan often with your provider. Make sure you get all the shots, also called vaccines, that your provider recommends. Ask your provider about a flu shot and a pneumonia shot. Use oxygen therapy if told by your provider. If you need home oxygen therapy, ask your provider how often  to check your oxygen level with a device called an oximeter. Keep all follow-up visits to review your COPD action plan. Your provider will want to check on your condition often to keep you healthy and out of the hospital. Contact a health care provider if: Your COPD symptoms get worse. You have a fever or chills. You have trouble doing daily activities. You have trouble breathing even when you are resting. Get help right away if: You are short of breath and cannot: Talk in full sentences. Do normal activities. You have chest pain. You feel confused. These symptoms may be an emergency. Call 911 right away. Do not wait to see if the symptoms will go away. Do not drive yourself to the hospital. This information is not intended to replace advice given to you by your health care provider. Make sure you discuss any questions you have with your health care provider. Document Revised: 12/20/2022 Document Reviewed: 06/04/2022 Elsevier Patient Education  2024 [employer]   If you have been instructed to  have an in-person evaluation today at a local Urgent Care facility, please use the link below. It will take you to a list of all of our available [facility], including address, phone number and hours of operation. Please do not delay care.  [facility]  If you or a family member do not have a primary care provider, use the link below to schedule a visit and establish care. When you choose a [facility] primary care physician or advanced practice provider, you gain a long-term partner in health. Find a Primary Care Provider  Learn more about [facility]'s in-office and virtual care options:  - Get Care Now

## 2023-09-30 ENCOUNTER — Telehealth: Admitting: Physician Assistant

## 2023-09-30 DIAGNOSIS — J019 Acute sinusitis, unspecified: Secondary | ICD-10-CM | POA: Diagnosis not present

## 2023-09-30 DIAGNOSIS — B9689 Other specified bacterial agents as the cause of diseases classified elsewhere: Secondary | ICD-10-CM

## 2023-09-30 MED ORDER — AMOXICILLIN-POT CLAVULANATE 875-125 MG PO TABS: 1.0000 | ORAL_TABLET | Freq: Two times a day (BID) | ORAL | 0 refills | Status: DC

## 2023-09-30 NOTE — Patient Instructions (Signed)
 Elizabeth [name], thank you for joining Delon Elizabeth Dickinson, PA-C for today's virtual visit.  While this provider is not your primary care provider (PCP), if your PCP is located in our provider database this encounter information will be shared with them immediately following your visit.   A [facility] MyChart account gives you access to today's visit and all your visits, tests, and labs performed at [facility]  click here if you don't have a [name] or go to [email or web address]  Consent: (Patient) Elizabeth [name] provided verbal consent for this virtual visit at the beginning of the encounter.  Current Medications:  Current Outpatient Medications:    amoxicillin -clavulanate (AUGMENTIN ) 875-125 MG tablet, Take 1 tablet by mouth 2 (two) times daily., Disp: 20 tablet, Rfl: 0   albuterol  (VENTOLIN  HFA) 108 (90 Base) MCG/ACT inhaler, Inhale 2 puffs into the lungs every 6 (six) hours as needed for wheezing or shortness of breath., Disp: 8 g, Rfl: 5   ARIPiprazole  (ABILIFY ) 5 MG tablet, Take 1 tablet by mouth once daily, Disp: 90 tablet, Rfl: 1   benzonatate  (TESSALON ) 100 MG capsule, Take 1 capsule (100 mg total) by mouth 3 (three) times daily as needed for cough., Disp: 30 capsule, Rfl: 0   cyanocobalamin  (VITAMIN B12) 1000 MCG/ML injection, Inject 1 mL (1,000 mcg total) into the muscle every 30 (thirty) days., Disp: 3 mL, Rfl: 1   doxycycline  (VIBRA -TABS) 100 MG tablet, Take 1 tablet (100 mg total) by mouth 2 (two) times daily., Disp: 14 tablet, Rfl: 0   fluticasone (FLONASE) 50 MCG/ACT nasal spray, Place 2 sprays into both nostrils daily., Disp: , Rfl: 1   furosemide  (LASIX ) 20 MG tablet, Take 1 tablet by mouth once daily, Disp: 90 tablet, Rfl: 0   losartan  (COZAAR ) 50 MG tablet, Take 1 tablet by mouth once daily, Disp: 90 tablet, Rfl: 0   magnesium  oxide (MAG-OX) 400 MG tablet, Take 1 tablet (400 mg total) by mouth daily., Disp: 90 tablet, Rfl: 3   Multiple  Vitamin (MULTI-VITAMINS) TABS, Take by mouth., Disp: , Rfl:    OHTUVAYRE  3 MG/2.5ML SUSP, Take 3 mg by nebulization in the morning and at bedtime., Disp: , Rfl:    omeprazole  (PRILOSEC) 20 MG capsule, TAKE 1 CAPSULE BY MOUTH ONCE DAILY BEFORE BREAKFAST, Disp: 90 capsule, Rfl: 0   potassium chloride  SA (KLOR-CON  M) 20 MEQ tablet, Take 1 tablet by mouth once daily, Disp: 90 tablet, Rfl: 0   predniSONE  (DELTASONE ) 20 MG tablet, Take 2 tablets (40 mg total) by mouth daily with breakfast., Disp: 10 tablet, Rfl: 0   rosuvastatin (CRESTOR) 40 MG tablet, Take 40 mg by mouth daily., Disp: , Rfl:    RYBELSUS  3 MG TABS, Take 1 tablet (3 mg total) by mouth daily., Disp: , Rfl:    SPIRIVA  HANDIHALER 18 MCG inhalation capsule, Place 1 capsule (18 mcg total) into inhaler and inhale daily., Disp: 90 capsule, Rfl: 3   SYMBICORT 160-4.5 MCG/ACT inhaler, Inhale 2 puffs into the lungs., Disp: , Rfl:    venlafaxine  XR (EFFEXOR -XR) 75 MG 24 hr capsule, TAKE 3 CAPSULES BY MOUTH ONCE DAILY WITH BREAKFAST, Disp: 270 capsule, Rfl: 0   Medications ordered in this encounter:  Meds ordered this encounter  Medications   amoxicillin -clavulanate (AUGMENTIN ) 875-125 MG tablet    Sig: Take 1 tablet by mouth 2 (two) times daily.    Dispense:  20 tablet    Refill:  0    Supervising Provider:   BLAISE ALEENE KIDD [  [id number]]     *If you need refills on other medications prior to your next appointment, please contact your pharmacy*  Follow-Up: Call back or seek an in-person evaluation if the symptoms worsen or if the condition fails to improve as anticipated.  [facility] [phone]  Other Instructions Sinus Infection, Adult A sinus infection, also called sinusitis, is inflammation of your sinuses. Sinuses are hollow spaces in the bones around your face. Your sinuses are located: Around your eyes. In the middle of your forehead. Behind your nose. In your cheekbones. Mucus normally drains out of your  sinuses. When your nasal tissues become inflamed or swollen, mucus can become trapped or blocked. This allows bacteria, viruses, and fungi to grow, which leads to infection. Most infections of the sinuses are caused by a virus. A sinus infection can develop quickly. It can last for up to 4 weeks (acute) or for more than 12 weeks (chronic). A sinus infection often develops after a cold. What are the causes? This condition is caused by anything that creates swelling in the sinuses or stops mucus from draining. This includes: Allergies. Asthma. Infection from bacteria or viruses. Deformities or blockages in your nose or sinuses. Abnormal growths in the nose (nasal polyps). Pollutants, such as chemicals or irritants in the air. Infection from fungi. This is rare. What increases the risk? You are more likely to develop this condition if you: Have a weak body defense system (immune system). Do a lot of swimming or diving. Overuse nasal sprays. Smoke. What are the signs or symptoms? The main symptoms of this condition are pain and a feeling of pressure around the affected sinuses. Other symptoms include: Stuffy nose or congestion that makes it difficult to breathe through your nose. Thick yellow or greenish drainage from your nose. Tenderness, swelling, and warmth over the affected sinuses. A cough that may get worse at night. Decreased sense of smell and taste. Extra mucus that collects in the throat or the back of the nose (postnasal drip) causing a sore throat or bad breath. Tiredness (fatigue). Fever. How is this diagnosed? This condition is diagnosed based on: Your symptoms. Your medical history. A physical exam. Tests to find out if your condition is acute or chronic. This may include: Checking your nose for nasal polyps. Viewing your sinuses using a device that has a light (endoscope). Testing for allergies or bacteria. Imaging tests, such as an MRI or CT scan. In rare cases, a  bone biopsy may be done to rule out more serious types of fungal sinus disease. How is this treated? Treatment for a sinus infection depends on the cause and whether your condition is chronic or acute. If caused by a virus, your symptoms should go away on their own within 10 days. You may be given medicines to relieve symptoms. They include: Medicines that shrink swollen nasal passages (decongestants). A spray that eases inflammation of the nostrils (topical intranasal corticosteroids). Rinses that help get rid of thick mucus in your nose (nasal saline washes). Medicines that treat allergies (antihistamines). Over-the-counter pain relievers. If caused by bacteria, your health care provider may recommend waiting to see if your symptoms improve. Most bacterial infections will get better without antibiotic medicine. You may be given antibiotics if you have: A severe infection. A weak immune system. If caused by narrow nasal passages or nasal polyps, surgery may be needed. Follow these instructions at home: Medicines Take, use, or apply over-the-counter and prescription medicines only as told by your  health care provider. These may include nasal sprays. If you were prescribed an antibiotic medicine, take it as told by your health care provider. Do not stop taking the antibiotic even if you start to feel better. Hydrate and humidify  Drink enough fluid to keep your urine pale yellow. Staying hydrated will help to thin your mucus. Use a cool mist humidifier to keep the humidity level in your home above 50%. Inhale steam for 10-15 minutes, 3-4 times a day, or as told by your health care provider. You can do this in the bathroom while a hot shower is running. Limit your exposure to cool or dry air. Rest Rest as much as possible. Sleep with your head raised (elevated). Make sure you get enough sleep each night. General instructions  Apply a warm, moist washcloth to your face 3-4 times a day or as  told by your health care provider. This will help with discomfort. Use nasal saline washes as often as told by your health care provider. Wash your hands often with soap and water to reduce your exposure to germs. If soap and water are not available, use hand sanitizer. Do not smoke. Avoid being around people who are smoking (secondhand smoke). Keep all follow-up visits. This is important. Contact a health care provider if: You have a fever. Your symptoms get worse. Your symptoms do not improve within 10 days. Get help right away if: You have a severe headache. You have persistent vomiting. You have severe pain or swelling around your face or eyes. You have vision problems. You develop confusion. Your neck is stiff. You have trouble breathing. These symptoms may be an emergency. Get help right away. Call 911. Do not wait to see if the symptoms will go away. Do not drive yourself to the hospital. Summary A sinus infection is soreness and inflammation of your sinuses. Sinuses are hollow spaces in the bones around your face. This condition is caused by nasal tissues that become inflamed or swollen. The swelling traps or blocks the flow of mucus. This allows bacteria, viruses, and fungi to grow, which leads to infection. If you were prescribed an antibiotic medicine, take it as told by your health care provider. Do not stop taking the antibiotic even if you start to feel better. Keep all follow-up visits. This is important. This information is not intended to replace advice given to you by your health care provider. Make sure you discuss any questions you have with your health care provider. Document Revised: 02/21/2021 Document Reviewed: 02/21/2021 Elsevier Patient Education  2024 Elsevier Inc.   If you have been instructed to have an in-person evaluation today at a local Urgent Care facility, please use the link below. It will take you to a list of all of our available [facility], including address, phone number and hours of operation. Please do not delay care.  [facility]  If you or a family member do not have a primary care provider, use the link below to schedule a visit and establish care. When you choose a [facility] primary care physician or advanced practice provider, you gain a long-term partner in health. Find a Primary Care Provider  Learn more about [facility]'s in-office and virtual care options: [facility] - Get Care Now

## 2023-09-30 NOTE — Progress Notes (Signed)
 Virtual Visit Consent   Elizabeth [name], you are scheduled for a virtual visit with a [facility] provider today. Just as with appointments in the office, your consent must be obtained to participate. Your consent will be active for this visit and any virtual visit you may have with one of our providers in the next 365 days. If you have a MyChart account, a copy of this consent can be sent to you electronically.  As this is a virtual visit, video technology does not allow for your provider to perform a traditional examination. This may limit your provider's ability to fully assess your condition. If your provider identifies any concerns that need to be evaluated in person or the need to arrange testing (such as labs, EKG, etc.), we will make arrangements to do so. Although advances in technology are sophisticated, we cannot ensure that it will always work on either your end or our end. If the connection with a video visit is poor, the visit may have to be switched to a telephone visit. With either a video or telephone visit, we are not always able to ensure that we have a secure connection.  By engaging in this virtual visit, you consent to the provision of healthcare and authorize for your insurance to be billed (if applicable) for the services provided during this visit. Depending on your insurance coverage, you may receive a charge related to this service.  I need to obtain your verbal consent now. Are you willing to proceed with your visit today? Elizabeth [name] has provided verbal consent on 09/30/2023 for a virtual visit (video or telephone). Delon CHRISTELLA Dickinson, PA-C  Date: 09/30/2023 12:04 PM   Virtual Visit via Video Note   I, Delon CHRISTELLA Dickinson, connected with  Elizabeth [name]  ([id number], Jun 27, 1950) on 09/30/23 at 12:00 PM EDT by a video-enabled telemedicine application and verified that I am speaking with the correct person using two identifiers.  Location: Patient: Virtual Visit Location  Patient: Home Provider: Virtual Visit Location Provider: Home Office   I discussed the limitations of evaluation and management by telemedicine and the availability of in person appointments. The patient expressed understanding and agreed to proceed.    History of Present Illness: Elizabeth [name] is a 73 y.o. who identifies as a female who was assigned female at birth, and is being seen today for continued sinus infection symptoms. Was seen [facility] on 09/24/23. Was placed on Doxycycline  100mg  BID x 7 days, has completed 6 days. COPD symptoms are improving, but sinus symptoms persist. Reports Augmentin  works better for her normally and is requesting this. Has completed the Prednisone  40mg  x 5 days as well. Has been continuing to use her inhalers and nebulizer machine. Has home O2 she wears nightly and as needed throughout the day.   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 03/11/2018   Senile nuclear sclerosis, left 03/11/2018   Substance abuse (HCC)    Genetic testing    Pernicious anemia 11/27/2016   Normocytic anemia 10/16/2016   Iron deficiency anemia 10/16/2016   Centrilobular emphysema (HCC)    Major depressive disorder, recurrent, moderate (HCC) 09/24/2016   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/2015   Bilateral carpal tunnel syndrome 05/27/2015   Bilateral hand numbness 05/17/2015   Fracture of humerus, proximal, closed 03/23/2013   Lipoma of skin and subcutaneous tissue 12/26/2011   B12 deficiency 02/15/2011   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   Dyspepsia and other specified disorders of function of stomach 10/11/2010   Benign  essential hypertension 10/11/2010   Mixed hyperlipidemia 10/11/2010   Tobacco use disorder 10/11/2010   Anxiety state 08/25/2009   Shortness of breath 07/24/2006    Allergies:  Allergies  Allergen Reactions   Moxifloxacin Itching, Other (See Comments), Photosensitivity, Rash and Swelling    Causes swelling, redness, burning in eyes    Levofloxacin Other (See Comments)    Other reaction(s): Muscle Pain Other reaction(s): Muscle Pain   Medications:  Current Outpatient Medications:    amoxicillin -clavulanate (AUGMENTIN ) 875-125 MG tablet, Take 1 tablet by mouth 2 (two) times daily., Disp: 20 tablet, Rfl: 0   albuterol  (VENTOLIN  HFA) 108 (90 Base) MCG/ACT inhaler, Inhale 2 puffs into the lungs every 6 (six) hours as needed for wheezing or shortness of breath., Disp: 8 g, Rfl: 5   ARIPiprazole  (ABILIFY ) 5 MG tablet, Take 1 tablet by mouth once daily, Disp: 90 tablet, Rfl: 1   benzonatate  (TESSALON ) 100 MG capsule, Take 1 capsule (100 mg total) by mouth 3 (three) times daily as needed for cough., Disp: 30 capsule, Rfl: 0   cyanocobalamin  (VITAMIN B12) 1000 MCG/ML injection, Inject 1 mL (1,000 mcg total) into the muscle every 30 (thirty) days., Disp: 3 mL, Rfl: 1   doxycycline  (VIBRA -TABS) 100 MG tablet, Take 1 tablet (100 mg total) by mouth 2 (two) times daily., Disp: 14 tablet, Rfl: 0   fluticasone (FLONASE) 50 MCG/ACT nasal spray, Place 2 sprays into both nostrils daily., Disp: , Rfl: 1   furosemide  (LASIX ) 20 MG tablet, Take 1 tablet by mouth once daily, Disp: 90 tablet, Rfl: 0   losartan  (COZAAR ) 50 MG tablet, Take 1 tablet by mouth once daily, Disp: 90 tablet, Rfl: 0   magnesium  oxide (MAG-OX) 400 MG tablet, Take 1 tablet (400 mg total) by mouth daily., Disp: 90 tablet, Rfl: 3   Multiple Vitamin (MULTI-VITAMINS) TABS, Take by mouth., Disp: , Rfl:    OHTUVAYRE  3 MG/2.5ML SUSP, Take 3 mg by nebulization in the morning and at bedtime., Disp: , Rfl:    omeprazole  (PRILOSEC) 20 MG capsule,  TAKE 1 CAPSULE BY MOUTH ONCE DAILY BEFORE BREAKFAST, Disp: 90 capsule, Rfl: 0   potassium chloride  SA (KLOR-CON  M) 20 MEQ tablet, Take 1 tablet by mouth once daily, Disp: 90 tablet, Rfl: 0   predniSONE  (DELTASONE ) 20 MG tablet, Take 2 tablets (40 mg total) by mouth daily with breakfast., Disp: 10 tablet, Rfl: 0   rosuvastatin (CRESTOR) 40 MG tablet, Take 40 mg by mouth daily., Disp: , Rfl:    RYBELSUS  3 MG TABS, Take 1 tablet (3 mg total) by mouth daily., Disp: , Rfl:    SPIRIVA  HANDIHALER 18 MCG inhalation capsule, Place 1 capsule (18 mcg total) into inhaler and inhale daily., Disp: 90 capsule, Rfl: 3   SYMBICORT 160-4.5 MCG/ACT inhaler, Inhale 2 puffs into the lungs., Disp: , Rfl:    venlafaxine  XR (EFFEXOR -XR) 75 MG 24 hr capsule, TAKE 3 CAPSULES BY MOUTH ONCE DAILY WITH BREAKFAST, Disp: 270 capsule, Rfl: 0  Observations/Objective: Patient is well-developed, well-nourished in no acute distress.  Resting comfortably at home.  Head is normocephalic, atraumatic.  No labored breathing.  Speech is clear  and coherent with logical content.  Patient is alert and oriented at baseline.    Assessment and Plan: 1. Acute bacterial sinusitis (Primary) - amoxicillin -clavulanate (AUGMENTIN ) 875-125 MG tablet; Take 1 tablet by mouth 2 (two) times daily.  Dispense: 20 tablet; Refill: 0  - Worsening symptoms that have not responded to OTC medications.  - Will give Augmentin  - Continue allergy medications.  - Steam and humidifier can help - Stay well hydrated and get plenty of rest.  - Seek in person evaluation if no symptom improvement or if symptoms worsen   Follow Up Instructions: I discussed the assessment and treatment plan with the patient. The patient was provided an opportunity to ask questions and all were answered. The patient agreed with the plan and demonstrated an understanding of the instructions.  A copy of instructions were sent to the patient via MyChart unless otherwise noted below.     The patient was advised to call back or seek an in-person evaluation if the symptoms worsen or if the condition fails to improve as anticipated.    Delon CHRISTELLA Dickinson, PA-C

## 2023-10-01 ENCOUNTER — Encounter: Admitting: Family Medicine

## 2023-10-04 ENCOUNTER — Telehealth: Admitting: Family Medicine

## 2023-10-04 ENCOUNTER — Other Ambulatory Visit: Payer: Self-pay | Admitting: Family Medicine

## 2023-10-04 DIAGNOSIS — E538 Deficiency of other specified B group vitamins: Secondary | ICD-10-CM

## 2023-10-04 DIAGNOSIS — D510 Vitamin B12 deficiency anemia due to intrinsic factor deficiency: Secondary | ICD-10-CM

## 2023-10-04 MED ORDER — CYANOCOBALAMIN 1000 MCG/ML IJ SOLN: 1000.0000 ug | INTRAMUSCULAR | 0 refills | Status: DC

## 2023-10-04 NOTE — Patient Instructions (Signed)
 Elizabeth [name], thank you for joining [name], PA-C for today's virtual visit.  While this provider is not your primary care provider (PCP), if your PCP is located in our provider database this encounter information will be shared with them immediately following your visit.   A [facility] MyChart account gives you access to today's visit and all your visits, tests, and labs performed at [facility]  click here if you don't have a [facility] MyChart account or go to [email or web address]  Consent: (Patient) Elizabeth [name] provided verbal consent for this virtual visit at the beginning of the encounter.  Current Medications:  Current Outpatient Medications:    albuterol  (VENTOLIN  HFA) 108 (90 Base) MCG/ACT inhaler, Inhale 2 puffs into the lungs every 6 (six) hours as needed for wheezing or shortness of breath., Disp: 8 g, Rfl: 5   amoxicillin -clavulanate (AUGMENTIN ) 875-125 MG tablet, Take 1 tablet by mouth 2 (two) times daily., Disp: 20 tablet, Rfl: 0   ARIPiprazole  (ABILIFY ) 5 MG tablet, Take 1 tablet by mouth once daily, Disp: 90 tablet, Rfl: 1   benzonatate  (TESSALON ) 100 MG capsule, Take 1 capsule (100 mg total) by mouth 3 (three) times daily as needed for cough., Disp: 30 capsule, Rfl: 0   cyanocobalamin  (VITAMIN B12) 1000 MCG/ML injection, Inject 1 mL (1,000 mcg total) into the muscle every 30 (thirty) days., Disp: 3 mL, Rfl: 0   doxycycline  (VIBRA -TABS) 100 MG tablet, Take 1 tablet (100 mg total) by mouth 2 (two) times daily., Disp: 14 tablet, Rfl: 0   fluticasone (FLONASE) 50 MCG/ACT nasal spray, Place 2 sprays into both nostrils daily., Disp: , Rfl: 1   furosemide  (LASIX ) 20 MG tablet, Take 1 tablet by mouth once daily, Disp: 90 tablet, Rfl: 0   losartan  (COZAAR ) 50 MG tablet, Take 1 tablet by mouth once daily, Disp: 90 tablet, Rfl: 0   magnesium  oxide (MAG-OX) 400 MG tablet, Take 1 tablet (400 mg total) by mouth daily., Disp: 90 tablet, Rfl: 3   Multiple Vitamin  (MULTI-VITAMINS) TABS, Take by mouth., Disp: , Rfl:    OHTUVAYRE  3 MG/2.5ML SUSP, Take 3 mg by nebulization in the morning and at bedtime., Disp: , Rfl:    omeprazole  (PRILOSEC) 20 MG capsule, TAKE 1 CAPSULE BY MOUTH ONCE DAILY BEFORE BREAKFAST, Disp: 90 capsule, Rfl: 0   potassium chloride  SA (KLOR-CON  M) 20 MEQ tablet, Take 1 tablet by mouth once daily, Disp: 90 tablet, Rfl: 0   predniSONE  (DELTASONE ) 20 MG tablet, Take 2 tablets (40 mg total) by mouth daily with breakfast., Disp: 10 tablet, Rfl: 0   rosuvastatin (CRESTOR) 40 MG tablet, Take 40 mg by mouth daily., Disp: , Rfl:    RYBELSUS  3 MG TABS, Take 1 tablet (3 mg total) by mouth daily., Disp: , Rfl:    SPIRIVA  HANDIHALER 18 MCG inhalation capsule, Place 1 capsule (18 mcg total) into inhaler and inhale daily., Disp: 90 capsule, Rfl: 3   SYMBICORT 160-4.5 MCG/ACT inhaler, Inhale 2 puffs into the lungs., Disp: , Rfl:    venlafaxine  XR (EFFEXOR -XR) 75 MG 24 hr capsule, TAKE 3 CAPSULES BY MOUTH ONCE DAILY WITH BREAKFAST, Disp: 270 capsule, Rfl: 0   Medications ordered in this encounter:  Meds ordered this encounter  Medications   cyanocobalamin  (VITAMIN B12) 1000 MCG/ML injection    Sig: Inject 1 mL (1,000 mcg total) into the muscle every 30 (thirty) days.    Dispense:  3 mL    Refill:  0     *If you  need refills on other medications prior to your next appointment, please contact your pharmacy*  Follow-Up: Call back or seek an in-person evaluation if the symptoms worsen or if the condition fails to improve as anticipated.  [facility] [phone]  Other Instructions Vitamin B12 Deficiency Vitamin B12 deficiency means that your body does not have enough vitamin B12. The body needs this important vitamin: To make red blood cells. To make genes (DNA). To help the nerves work. If you do not have enough vitamin B12 in your body, you can have health problems, such as not having enough red blood cells in the blood  (anemia). What are the causes? Not eating enough foods that contain vitamin B12. Not being able to take in (absorb) vitamin B12 from the food that you eat. Certain diseases. A condition in which the body does not make enough of a certain protein. This results in your body not taking in enough vitamin B12. Having a surgery in which part of the stomach or small intestine is taken out. Taking medicines that make it hard for the body to take in vitamin B12. These include: Heartburn medicines. Some medicines that are used to treat diabetes. What increases the risk? Being an older adult. Eating a vegetarian or vegan diet that does not include any foods that come from animals. Not eating enough foods that contain vitamin B12 while you are pregnant. Taking certain medicines. Having alcoholism. What are the signs or symptoms? In some cases, there are no symptoms. If the condition leads to too few blood cells or nerve damage, symptoms can occur, such as: Feeling weak or tired. Not being hungry. Losing feeling (numbness) or tingling in your hands and feet. Redness and burning of the tongue. Feeling sad (depressed). Confusion or memory problems. Trouble walking. If anemia is very bad, symptoms can include: Being short of breath. Being dizzy. Having a very fast heartbeat. How is this treated? Changing the way you eat and drink, such as: Eating more foods that contain vitamin B12. Drinking little or no alcohol. Getting vitamin B12 shots. Taking vitamin B12 supplements by mouth (orally). Your doctor will tell you the dose that is best for you. Follow these instructions at home: Eating and drinking  Eat foods that come from animals and have a lot of vitamin B12 in them. These include: Meats and poultry. This includes beef, pork, chicken, [place], and organ meats, such as liver. Seafood, such as clams, rainbow trout, salmon, tuna, and haddock. Eggs. Dairy foods such as milk, yogurt, and  cheese. Eat breakfast cereals that have vitamin B12 added to them (are fortified). Check the label. The items listed above may not be a complete list of foods and beverages you can eat and drink. Contact a dietitian for more information. Alcohol use Do not drink alcohol if: Your doctor tells you not to drink. You are pregnant, may be pregnant, or are planning to become pregnant. If you drink alcohol: Limit how much you have to: 0-1 drink a day for women. 0-2 drinks a day for men. Know how much alcohol is in your drink. In the [place] one drink equals one 12 oz bottle of beer (355 mL), one 5 oz glass of wine (148 mL), or one 1 oz glass of hard liquor (44 mL). General instructions Get any vitamin B12 shots if told by your doctor. Take supplements only as told by your doctor. Follow the directions. Keep all follow-up visits. Contact a doctor if: Your symptoms come back.  Your symptoms get worse or do not get better with treatment. Get help right away if: You have trouble breathing. You have a very fast heartbeat. You have chest pain. You get dizzy. You faint. These symptoms may be an emergency. Get help right away. Call 911. Do not wait to see if the symptoms will go away. Do not drive yourself to the hospital. Summary Vitamin B12 deficiency means that your body is not getting enough of the vitamin. In some cases, there are no symptoms of this condition. Treatment may include making a change in the way you eat and drink, getting shots, or taking supplements. Eat foods that have vitamin B12 in them. This information is not intended to replace advice given to you by your health care provider. Make sure you discuss any questions you have with your health care provider. Document Revised: 11/11/2020 Document Reviewed: 11/11/2020 Elsevier Patient Education  2024 Elsevier Inc.   If you have been instructed to have an in-person evaluation today at a local Urgent Care facility, please use  the link below. It will take you to a list of all of our available [facility], including address, phone number and hours of operation. Please do not delay care.  [facility]  If you or a family member do not have a primary care provider, use the link below to schedule a visit and establish care. When you choose a [facility] primary care physician or advanced practice provider, you gain a long-term partner in health. Find a Primary Care Provider  Learn more about [facility]'s in-office and virtual care options: [facility] - Get Care Now

## 2023-10-04 NOTE — Progress Notes (Signed)
 Virtual Visit Consent   Elizabeth [name], you are scheduled for a virtual visit with a [facility] provider today. Just as with appointments in the office, your consent must be obtained to participate. Your consent will be active for this visit and any virtual visit you may have with one of our providers in the next 365 days. If you have a MyChart account, a copy of this consent can be sent to you electronically.  As this is a virtual visit, video technology does not allow for your provider to perform a traditional examination. This may limit your provider's ability to fully assess your condition. If your provider identifies any concerns that need to be evaluated in person or the need to arrange testing (such as labs, EKG, etc.), we will make arrangements to do so. Although advances in technology are sophisticated, we cannot ensure that it will always work on either your end or our end. If the connection with a video visit is poor, the visit may have to be switched to a telephone visit. With either a video or telephone visit, we are not always able to ensure that we have a secure connection.  By engaging in this virtual visit, you consent to the provision of healthcare and authorize for your insurance to be billed (if applicable) for the services provided during this visit. Depending on your insurance coverage, you may receive a charge related to this service.  I need to obtain your verbal consent now. Are you willing to proceed with your visit today? Elizabeth [name] has provided verbal consent on 10/04/2023 for a virtual visit (video or telephone). Elizabeth [name], [place]  Date: 10/04/2023 11:08 AM   Virtual Visit via Video Note   I, Elizabeth [name], connected with  Elizabeth [name]  ([id number], 12-30-1950) on 10/04/23 at 11:00 AM EDT by a video-enabled telemedicine application and verified that I am speaking with the correct person using two identifiers.  Location: Patient: Virtual Visit Location Patient:  Home Provider: Virtual Visit Location Provider: Home Office   I discussed the limitations of evaluation and management by telemedicine and the availability of in person appointments. The patient expressed understanding and agreed to proceed.    History of Present Illness: Elizabeth [name] is a 73 y.o. who identifies as a female who was assigned female at birth, and is being seen today for c/o she is taking antibiotics because she had a COPD flare up. Daughter is present and helping with history.  Pt is requesting refills on her vitamin B-12.  Pt states has not had her medications in 2 months.  Pt states she called PCP office but they are closed today and was advised to make an appointment with virtual urgent care for refills.   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 03/11/2018   Senile nuclear sclerosis, left 03/11/2018   Substance abuse (HCC)    Genetic testing    Pernicious anemia 11/27/2016   Normocytic anemia 10/16/2016   Iron deficiency anemia 10/16/2016   Centrilobular emphysema (HCC)    Major depressive disorder, recurrent, moderate (HCC) 09/24/2016   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   Bilateral carpal tunnel syndrome  05/27/2015   Bilateral hand numbness 05/17/2015   Fracture of humerus, proximal, closed 03/23/2013   Lipoma of skin and subcutaneous tissue 12/26/2011   B12 deficiency 02/15/2011   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   Dyspepsia and other specified disorders of function of stomach 10/11/2010   Benign essential hypertension 10/11/2010   Mixed hyperlipidemia 10/11/2010   Tobacco use disorder 10/11/2010    Anxiety state 08/25/2009   Shortness of breath 07/24/2006    Allergies:  Allergies  Allergen Reactions   Moxifloxacin Itching, Other (See Comments), Photosensitivity, Rash and Swelling    Causes swelling, redness, burning in eyes    Levofloxacin Other (See Comments)    Other reaction(s): Muscle Pain Other reaction(s): Muscle Pain   Medications:  Current Outpatient Medications:    albuterol  (VENTOLIN  HFA) 108 (90 Base) MCG/ACT inhaler, Inhale 2 puffs into the lungs every 6 (six) hours as needed for wheezing or shortness of breath., Disp: 8 g, Rfl: 5   amoxicillin -clavulanate (AUGMENTIN ) 875-125 MG tablet, Take 1 tablet by mouth 2 (two) times daily., Disp: 20 tablet, Rfl: 0   ARIPiprazole  (ABILIFY ) 5 MG tablet, Take 1 tablet by mouth once daily, Disp: 90 tablet, Rfl: 1   benzonatate  (TESSALON ) 100 MG capsule, Take 1 capsule (100 mg total) by mouth 3 (three) times daily as needed for cough., Disp: 30 capsule, Rfl: 0   cyanocobalamin  (VITAMIN B12) 1000 MCG/ML injection, Inject 1 mL (1,000 mcg total) into the muscle every 30 (thirty) days., Disp: 3 mL, Rfl: 0   doxycycline  (VIBRA -TABS) 100 MG tablet, Take 1 tablet (100 mg total) by mouth 2 (two) times daily., Disp: 14 tablet, Rfl: 0   fluticasone (FLONASE) 50 MCG/ACT nasal spray, Place 2 sprays into both nostrils daily., Disp: , Rfl: 1   furosemide  (LASIX ) 20 MG tablet, Take 1 tablet by mouth once daily, Disp: 90 tablet, Rfl: 0   losartan  (COZAAR ) 50 MG tablet, Take 1 tablet by mouth once daily, Disp: 90 tablet, Rfl: 0   magnesium  oxide (MAG-OX) 400 MG tablet, Take 1 tablet (400 mg total) by mouth daily., Disp: 90 tablet, Rfl: 3   Multiple Vitamin (MULTI-VITAMINS) TABS, Take by mouth., Disp: , Rfl:    OHTUVAYRE  3 MG/2.5ML SUSP, Take 3 mg by nebulization in the morning and at bedtime., Disp: , Rfl:    omeprazole  (PRILOSEC) 20 MG capsule, TAKE 1 CAPSULE BY MOUTH ONCE DAILY BEFORE BREAKFAST, Disp: 90 capsule, Rfl: 0   potassium chloride  SA  (KLOR-CON  M) 20 MEQ tablet, Take 1 tablet by mouth once daily, Disp: 90 tablet, Rfl: 0   predniSONE  (DELTASONE ) 20 MG tablet, Take 2 tablets (40 mg total) by mouth daily with breakfast., Disp: 10 tablet, Rfl: 0   rosuvastatin (CRESTOR) 40 MG tablet, Take 40 mg by mouth daily., Disp: , Rfl:    RYBELSUS  3 MG TABS, Take 1 tablet (3 mg total) by mouth daily., Disp: , Rfl:    SPIRIVA  HANDIHALER 18 MCG inhalation capsule, Place 1 capsule (18 mcg total) into inhaler and inhale daily., Disp: 90 capsule, Rfl: 3   SYMBICORT 160-4.5 MCG/ACT inhaler, Inhale 2 puffs into the lungs., Disp: , Rfl:    venlafaxine  XR (EFFEXOR -XR) 75 MG 24 hr capsule, TAKE 3 CAPSULES BY MOUTH ONCE DAILY WITH BREAKFAST, Disp: 270 capsule, Rfl: 0  Observations/Objective: Patient is well-developed, well-nourished in no acute distress.  Resting comfortably at home.  Head is normocephalic, atraumatic.  No labored breathing.  Speech is clear and coherent with logical content.  Patient is  alert and oriented at baseline.    Assessment and Plan: 1. Pernicious anemia - cyanocobalamin  (VITAMIN B12) 1000 MCG/ML injection; Inject 1 mL (1,000 mcg total) into the muscle every 30 (thirty) days.  Dispense: 3 mL; Refill: 0  2. B12 deficiency - cyanocobalamin  (VITAMIN B12) 1000 MCG/ML injection; Inject 1 mL (1,000 mcg total) into the muscle every 30 (thirty) days.  Dispense: 3 mL; Refill: 0  -Medication refills today.  Pt to follow up with PCP for further refills   Follow Up Instructions: I discussed the assessment and treatment plan with the patient. The patient was provided an opportunity to ask questions and all were answered. The patient agreed with the plan and demonstrated an understanding of the instructions.  A copy of instructions were sent to the patient via MyChart unless otherwise noted below.    The patient was advised to call back or seek an in-person evaluation if the symptoms worsen or if the condition fails to improve as  anticipated.    Elizabeth Mater, PA-C

## 2023-10-08 NOTE — Telephone Encounter (Signed)
 Requested by interface [email or web address]. Duplicate request. Patient picked up Rx 10/04/23  Requested Prescriptions  Refused Prescriptions Disp Refills   cyanocobalamin  (VITAMIN B12) 1000 MCG/ML injection [Pharmacy Med Name: Cyanocobalamin  1000 MCG/ML Injection Solution] 3 mL 0    Sig: INJECT  1ML  INTRAMUSCULARLY ONCE EVERY 30 DAYS     Endocrinology:  Vitamins - Vitamin B12 Passed - 10/08/2023 11:38 AM      Passed - HCT in normal range and within 360 days    HCT  Date Value Ref Range Status  08/27/2023 37.0 35.0 - 45.0 % Final  05/06/2014 40.0 35.0 - 47.0 % Final         Passed - HGB in normal range and within 360 days    Hemoglobin  Date Value Ref Range Status  08/27/2023 11.8 11.7 - 15.5 g/dL Final   HGB  Date Value Ref Range Status  05/06/2014 13.2 12.0 - 16.0 g/dL Final         Passed - B12 Level in normal range and within 360 days    Vitamin B-12  Date Value Ref Range Status  08/27/2023 642 200 - 1,100 pg/mL Final         Passed - Valid encounter within last 12 months    Recent Outpatient Visits           1 month ago Annual physical exam   [facility] Marsa PARAS, DO   3 months ago Type 2 diabetes mellitus with other specified complication, without long-term current use of insulin [facility])   [facility], Marsa PARAS, [place]

## 2023-10-08 NOTE — Telephone Encounter (Signed)
 Called pharmacy to confirm receipt 10/04/23 at 11:08 am and patient did pick up Rx on 10/04/23

## 2023-10-09 ENCOUNTER — Emergency Department
Admission: EM | Admit: 2023-10-09 | Discharge: 2023-10-09 | Disposition: A | Discharge status: Home / Self Care | Attending: Emergency Medicine | Admitting: Emergency Medicine

## 2023-10-09 ENCOUNTER — Ambulatory Visit: Payer: Self-pay

## 2023-10-09 ENCOUNTER — Emergency Department

## 2023-10-09 ENCOUNTER — Other Ambulatory Visit: Payer: Self-pay

## 2023-10-09 ENCOUNTER — Ambulatory Visit

## 2023-10-09 VITALS — BP 148/80 | HR 59 | Temp 98.4°F | Ht 62.0 in | Wt 183.2 lb

## 2023-10-09 DIAGNOSIS — R531 Weakness: Secondary | ICD-10-CM | POA: Diagnosis not present

## 2023-10-09 DIAGNOSIS — J441 Chronic obstructive pulmonary disease with (acute) exacerbation: Secondary | ICD-10-CM | POA: Insufficient documentation

## 2023-10-09 DIAGNOSIS — J9811 Atelectasis: Secondary | ICD-10-CM | POA: Diagnosis not present

## 2023-10-09 DIAGNOSIS — R0602 Shortness of breath: Secondary | ICD-10-CM | POA: Diagnosis not present

## 2023-10-09 DIAGNOSIS — E860 Dehydration: Secondary | ICD-10-CM | POA: Diagnosis not present

## 2023-10-09 DIAGNOSIS — J984 Other disorders of lung: Secondary | ICD-10-CM | POA: Diagnosis not present

## 2023-10-09 DIAGNOSIS — R918 Other nonspecific abnormal finding of lung field: Secondary | ICD-10-CM | POA: Diagnosis not present

## 2023-10-09 DIAGNOSIS — R7309 Other abnormal glucose: Secondary | ICD-10-CM | POA: Diagnosis not present

## 2023-10-09 DIAGNOSIS — D509 Iron deficiency anemia, unspecified: Secondary | ICD-10-CM | POA: Diagnosis not present

## 2023-10-09 LAB — BASIC METABOLIC PANEL WITH GFR
Anion gap: 13 (ref 5–15)
BUN: 10 mg/dL (ref 8–23)
CO2: 22 mmol/L (ref 22–32)
Calcium: 8.8 mg/dL — ABNORMAL LOW (ref 8.9–10.3)
Chloride: 102 mmol/L (ref 98–111)
Creatinine, Ser: 0.88 mg/dL (ref 0.44–1.00)
GFR, Estimated: >60 mL/min (ref >60)
Glucose, Bld: 159 mg/dL — ABNORMAL HIGH (ref 70–99)
Potassium: 3.2 mmol/L — ABNORMAL LOW (ref 3.5–5.1)
Sodium: 137 mmol/L (ref 135–145)

## 2023-10-09 LAB — CBC
HCT: 36.3 % (ref 36.0–46.0)
Hemoglobin: 11.8 g/dL — ABNORMAL LOW (ref 12.0–15.0)
MCH: 27.2 pg (ref 26.0–34.0)
MCHC: 32.5 g/dL (ref 30.0–36.0)
MCV: 83.6 fL (ref 80.0–100.0)
Platelets: 268 K/uL (ref 150–400)
RBC: 4.34 MIL/uL (ref 3.87–5.11)
RDW: 13.9 % (ref 11.5–15.5)
WBC: 9.8 K/uL (ref 4.0–10.5)
nRBC: 0 % (ref 0.0–0.2)

## 2023-10-09 LAB — TROPONIN I (HIGH SENSITIVITY)
Troponin I (High Sensitivity): 5 ng/L (ref <18)
Troponin I (High Sensitivity): 5 ng/L (ref <18)

## 2023-10-09 MED ORDER — PREDNISONE 10 MG (21) PO TBPK: ORAL_TABLET | ORAL | 0 refills | Status: DC

## 2023-10-09 MED ORDER — SODIUM CHLORIDE 0.9 % IV BOLUS
500.0000 mL | Freq: Once | INTRAVENOUS | Status: AC
  Administered 2023-10-09: 500 mL via INTRAVENOUS

## 2023-10-09 MED ORDER — METHYLPREDNISOLONE SODIUM SUCC 125 MG IJ SOLR
125.0000 mg | Freq: Once | INTRAMUSCULAR | Status: AC
  Administered 2023-10-09: 125 mg via INTRAVENOUS
  Filled 2023-10-09: qty 2

## 2023-10-09 NOTE — Progress Notes (Signed)
 Acute Office Visit  Subjective:     Patient ID: Elizabeth [name], female    DOB: [birth year], 73 y.o.   MRN: [id number]  Chief Complaint  Patient presents with   Pneumonia    Has been feeling weak no pain for 1 month. Has been feeling dizzy. Has had 2 antibiotics prescribed. Would like to get blood work done to make sure its not her iron levels being low as she has had ferritin transfusion done in the past.    HPI Patient is in today for Patient seen for acute visit.  She was roomed so on the last minute.  She is feeling quite tired and weak and dizzy.  She reports shortness of breath which is not new for her.  She has a lengthy medical history including emphysema and type 2 diabetes.  She reports that her ferritin was low on last test.  Denies cough fever chills or other symptoms.  She uses her inhalers on a as needed basis but finds this is not helping with weakness or dizziness.  ROS  Patient denies any chest pain.  She denies any palpitations.  Shortness of breath is at baseline, possibly worse     Objective:    BP (!) 148/80 (BP Location: Left Arm, Patient Position: Sitting, Cuff Size: Normal)   Pulse (!) 59   Temp 98.4 F (36.9 C) (Oral)   Ht 5' 2 (1.575 m)   Wt 183 lb 3.2 oz (83.1 kg)   SpO2 98%   BMI 33.51 kg/m    Physical Exam Physical Exam Vitals reviewed.  Constitutional:      Appearance: Normal appearance. Obese Cardiovascular:     Rate and Rhythm: Additional heart sounds noted.  Normal peripheral pulses Pulmonary:     Normal breath sounds with normal effort Skin:    General: Skin is warm and dry Neurological:     General: No focal deficit present.  Psychiatric:        Mood and Affect: Mood, behavior and cognition normal  No results found for any visits on 10/09/23.      Assessment & Plan:  #1 Shortness of breath with generalized weakness and dizziness.  It does not seem that she is having a COPD exacerbation but the underlying cause of her symptoms is  not entirely clear as yet.  Is absolutely impossible in the time of our visit without knowing this patient to provide adequate medical care.  I will obtain baseline chest x-ray for her, labs.  EKG showing PACs and underlying irregular rhythm which may be causing her underlying dizziness.  Have advised patient to go to the emergency room which she adamantly declined.  She has agreed to go through urgent care as well.  We will obtain these tests simply to document stability and have her follow-up with her PCP as soon as possible:   Note update:  Patient appears to have presented to the ED.   Vital signs stable.   Problem List Items Addressed This Visit   None Visit Diagnoses       Weakness    -  Primary   Relevant Orders   EKG 12-Lead   DG Chest 2 View   CBC with Differential/Platelet   CMP14+EGFR   Iron, TIBC and Ferritin Panel   Hemoglobin A1c   Urinalysis, Routine w reflex microscopic   C-reactive protein      Patient has follow up with her primary care physician Friday. July 11th  Jerico Grisso A Melaya [place]

## 2023-10-09 NOTE — ED Provider Notes (Signed)
 [facility] Provider Note    Event Date/Time   First MD Initiated Contact with Patient 10/09/23 1750     (approximate)   History   Sinus infection   HPI  Elizabeth [name] is a 73 y.o. female  who presents to the emergency department today because of concern for continued sinus infection and shortness of breath. She has had a sinus infection for roughly 1 month. Describes a pressure behind her forehead. It moved down into her chest. Has been on two rounds of antibiotics without any significant relief. Has history of COPD and afib. Went to urgent care today where she says they were concerned about her EKG so asked her to come to the emergency department. She denies any chest pain. Denies any lower extremity edema.     Physical Exam   Triage Vital Signs: ED Triage Vitals [10/09/23 1555]  Encounter Vitals Group     BP (!) 177/77     Girls Systolic BP Percentile      Girls Diastolic BP Percentile      Boys Systolic BP Percentile      Boys Diastolic BP Percentile      Pulse Rate (!) 135     Resp 17     Temp 98.6 F (37 C)     Temp Source Oral     SpO2 99 %     Weight 182 lb (82.6 kg)     Height 5' 2 (1.575 m)     Head Circumference      Peak Flow      Pain Score 0     Pain Loc      Pain Education      Exclude from Growth Chart     Most recent vital signs: Vitals:   10/09/23 1555  BP: (!) 177/77  Pulse: (!) 135  Resp: 17  Temp: 98.6 F (37 C)  SpO2: 99%   General: Awake, alert, oriented. CV:  Good peripheral perfusion. Regular rate and rhythm. Resp:  Slightly increased respiratory effort. Slight expiratory wheezing noted diffusely Abd:  No distention.    ED Results / Procedures / Treatments   Labs (all labs ordered are listed, but only abnormal results are displayed) Labs Reviewed  BASIC METABOLIC PANEL WITH GFR - Abnormal; Notable for the following components:      Result Value   Potassium 3.2 (*)    Glucose, Bld 159 (*)     Calcium 8.8 (*)    All other components within normal limits  CBC - Abnormal; Notable for the following components:   Hemoglobin 11.8 (*)    All other components within normal limits  TROPONIN I (HIGH SENSITIVITY)  TROPONIN I (HIGH SENSITIVITY)     EKG  I, Guadalupe Eagles, attending physician, personally viewed and interpreted this EKG  EKG Time: 1557 Rate: 123 Rhythm: atrial fibrillation Axis: normal Intervals: qtc 495 QRS: narrow ST changes: no st elevation Impression: abnormal ekg  RADIOLOGY I independently interpreted and visualized the CXR. My interpretation: No pneumonia Radiology interpretation: IMPRESSION: *No active cardiopulmonary disease. Redemonstration of focal irregular thickening with associated scarring/atelectasis in the lateral aspect of the left lower lung zone, without significant interval change.    PROCEDURES:  Critical Care performed: No    MEDICATIONS ORDERED IN ED: Medications - No data to display   IMPRESSION / MDM / ASSESSMENT AND PLAN / ED COURSE  I reviewed the triage vital signs and the nursing notes.  Differential diagnosis includes, but is not limited to, dehydration, infection, pneumonia, hypoxia, COPD  Patient's presentation is most consistent with acute presentation with potential threat to life or bodily function.   The patient is on the cardiac monitor to evaluate for evidence of arrhythmia and/or significant heart rate changes.  Patient presented to the emergency department today because of concerns for continued sinus infection, shortness of breath.  On exam patient does have some expiratory wheezing.  Workup without any concerning leukocytosis.  Troponin was negative.  Chest x-ray without pneumonia.  Patient was given some fluids here as well as steroids.  Did state that she felt better.  On repeat exam it did appear that she continued to have some increased respiratory effort.  She however  stated she felt at her baseline.  I did offer to give patient breathing treatments and continue to manage her here in the emergency department.  However she felt comfortable being discharged home.  Will give patient prescription for steroids.      FINAL CLINICAL IMPRESSION(S) / ED DIAGNOSES   Final diagnoses:  Weakness  Dehydration  COPD exacerbation (HCC)      Note:  This document was prepared using Dragon voice recognition software and may include unintentional dictation errors.    Floy Roberts, MD 10/09/23 [phone]

## 2023-10-09 NOTE — Telephone Encounter (Signed)
 FYI Only or Action Required?: FYI only for provider.  Patient was last seen in primary care on 09/04/2023 by Edman Marsa PARAS, DO.  Called Nurse Triage reporting Fatigue and Cough.  Symptoms began several weeks ago.  Interventions attempted: Prescription medications: doxycycline , prednisone  and albuterol .  Symptoms are: unchanged.  Triage Disposition: See Physician Within 24 Hours  Patient/caregiver understands and will follow disposition?: Yes                             Copied from CRM [id number]. Topic: Clinical - Red Word Triage >> Oct 09, 2023 12:53 PM Rachelle R wrote: [facility] that prompted transfer to Nurse Triage: Patients daughter Reena states her mother for the last 3-4 weeks has not been feeling well. She is very weak and she thinks her iron may be low. Would like to get her in to be seen or have labs done.  1. ONSET: When did the cough begin?  3 weeks ago  2. SEVERITY: How bad is the cough today?  Frequent coughing spells  3. SPUTUM: Describe the color of your sputum (e.g., none, dry cough; clear, white, yellow, green) Thick and chunky  5. DIFFICULTY BREATHING: Are you having difficulty breathing? If Yes, ask: How bad is it? (e.g., mild, moderate, severe)  - MILD: No SOB at rest, mild SOB with walking, speaks normally in sentences, can lie down, no retractions, pulse < 100.  - MODERATE: SOB at rest, SOB with minimal exertion and prefers to sit, cannot lie down flat, speaks in phrases, mild retractions, audible wheezing, pulse 100-120.  - SEVERE: Very SOB at rest, speaks in single words, struggling to breathe, sitting hunched forward, retractions, pulse > 120.  Daughter states O2 has been at 95%, denies SOB at rest   6. FEVER: Do you have a fever? If Yes, ask: What is your temperature, how was it measured, and when did it start? Low grade fever around 100 for past 3 weeks  7. CARDIAC HISTORY: Do you have any history of  heart disease? (e.g., heart attack, congestive heart failure)  Denies  8. LUNG HISTORY: Do you have any history of lung disease? (e.g., pulmonary embolus, asthma, emphysema) COPD  10. OTHER SYMPTOMS: Do you have any other symptoms? (e.g., runny nose, wheezing, chest pain) Generalized fatigue    No appointments available until 10/21/23. This RN called CAL and patient was added to schedule today.  Reason for Disposition  Fever present > 3 days (72 hours)  Protocols used: Cough - Acute Productive-A-AH

## 2023-10-09 NOTE — ED Notes (Signed)
Goodman MD at bedside. 

## 2023-10-09 NOTE — ED Triage Notes (Signed)
 Pt sts that she has been having SOB since having a sinus infection three weeks ago. Pt sts that she does have afib and she has QT prolongation.

## 2023-10-10 LAB — CBC WITH DIFFERENTIAL/PLATELET
Absolute Lymphocytes: 2903 {cells}/uL (ref 850–3900)
Absolute Monocytes: 592 {cells}/uL (ref 200–950)
Basophils Absolute: 9 {cells}/uL (ref 0–200)
Basophils Relative: 0.1 %
Eosinophils Absolute: 9 {cells}/uL — ABNORMAL LOW (ref 15–500)
Eosinophils Relative: 0.1 %
HCT: 37.7 % (ref 35.0–45.0)
Hemoglobin: 11.9 g/dL (ref 11.7–15.5)
MCH: 26.8 pg — ABNORMAL LOW (ref 27.0–33.0)
MCHC: 31.6 g/dL — ABNORMAL LOW (ref 32.0–36.0)
MCV: 84.9 fL (ref 80.0–100.0)
MPV: 9.9 fL (ref 7.5–12.5)
Monocytes Relative: 6.5 %
Neutro Abs: 5587 {cells}/uL (ref 1500–7800)
Neutrophils Relative %: 61.4 %
Platelets: 267 Thousand/uL (ref 140–400)
RBC: 4.44 Million/uL (ref 3.80–5.10)
RDW: 13.4 % (ref 11.0–15.0)
Total Lymphocyte: 31.9 %
WBC: 9.1 Thousand/uL (ref 3.8–10.8)

## 2023-10-10 LAB — IRON,TIBC AND FERRITIN PANEL
%SAT: 16 % (ref 16–45)
Ferritin: 24 ng/mL (ref 16–288)
Iron: 51 ug/dL (ref 45–160)
TIBC: 322 ug/dL (ref 250–450)

## 2023-10-10 LAB — HEMOGLOBIN A1C
Hgb A1c MFr Bld: 6.9 % — ABNORMAL HIGH (ref <5.7)
Mean Plasma Glucose: 151 mg/dL
eAG (mmol/L): 8.4 mmol/L

## 2023-10-10 LAB — C-REACTIVE PROTEIN: CRP: 3 mg/L (ref <8.0)

## 2023-10-11 ENCOUNTER — Ambulatory Visit

## 2023-10-11 ENCOUNTER — Encounter: Payer: Self-pay | Admitting: Family Medicine

## 2023-10-11 ENCOUNTER — Ambulatory Visit: Admitting: Family Medicine

## 2023-10-11 VITALS — BP 134/70 | HR 56 | Ht 62.0 in | Wt 184.5 lb

## 2023-10-11 DIAGNOSIS — R531 Weakness: Secondary | ICD-10-CM | POA: Diagnosis not present

## 2023-10-11 DIAGNOSIS — Z0000 Encounter for general adult medical examination without abnormal findings: Secondary | ICD-10-CM | POA: Diagnosis not present

## 2023-10-11 DIAGNOSIS — J0110 Acute frontal sinusitis, unspecified: Secondary | ICD-10-CM | POA: Diagnosis not present

## 2023-10-11 DIAGNOSIS — I4891 Unspecified atrial fibrillation: Secondary | ICD-10-CM

## 2023-10-11 MED ORDER — DOXYCYCLINE HYCLATE 100 MG PO TABS: 100.0000 mg | ORAL_TABLET | Freq: Two times a day (BID) | ORAL | 0 refills | Status: DC

## 2023-10-11 MED ORDER — METOPROLOL SUCCINATE ER 25 MG PO TB24: 25.0000 mg | ORAL_TABLET | Freq: Every day | ORAL | 1 refills | Status: AC

## 2023-10-11 NOTE — Progress Notes (Signed)
 Subjective:   Elizabeth [name] is a 73 y.o. who presents for a Medicare Wellness preventive visit.  As a reminder, Annual Wellness Visits don't include a physical exam, and some assessments may be limited, especially if this visit is performed virtually. We may recommend an in-person follow-up visit with your provider if needed.  Visit Complete: Virtual I connected with  [name] on 10/11/23 by a audio enabled telemedicine application and verified that I am speaking with the correct person using two identifiers.  Patient Location: Home  Provider Location: Home Office  I discussed the limitations of evaluation and management by telemedicine. The patient expressed understanding and agreed to proceed.  Vital Signs: Because this visit was a virtual/telehealth visit, some criteria may be missing or patient reported. Any vitals not documented were not able to be obtained and vitals that have been documented are patient reported.  VideoDeclined- This patient declined Librarian, academic. Therefore the visit was completed with audio only.  Persons Participating in Visit: Patient.  AWV Questionnaire: No: Patient Medicare AWV questionnaire was not completed prior to this visit.  Cardiac Risk Factors include: advanced age (>70men, >49 women);hypertension;dyslipidemia;sedentary lifestyle;obesity (BMI >30kg/m2);smoking/ tobacco exposure     Objective:    Today's Vitals   10/11/23 1439  PainSc: 0-No pain   There is no height or weight on file to calculate BMI.     10/11/2023    2:59 PM 10/09/2023    3:56 PM 12/27/2022   10:05 AM 12/13/2022    9:47 AM 09/13/2022    2:01 PM 04/23/2022   10:53 AM 10/20/2020   11:32 AM  Advanced Directives  Does Patient Have a Medical Advance Directive? No No No No No No No  Would patient like information on creating a medical advance directive? No - Patient declined  No - Patient declined No - Patient declined No - Patient  declined No - Patient declined     Current Medications (verified) Outpatient Encounter Medications as of 10/11/2023  Medication Sig   albuterol  (VENTOLIN  HFA) 108 (90 Base) MCG/ACT inhaler Inhale 2 puffs into the lungs every 6 (six) hours as needed for wheezing or shortness of breath.   amoxicillin -clavulanate (AUGMENTIN ) 875-125 MG tablet Take 1 tablet by mouth 2 (two) times daily.   ARIPiprazole  (ABILIFY ) 5 MG tablet Take 1 tablet by mouth once daily   benzonatate  (TESSALON ) 100 MG capsule Take 1 capsule (100 mg total) by mouth 3 (three) times daily as needed for cough.   cyanocobalamin  (VITAMIN B12) 1000 MCG/ML injection Inject 1 mL (1,000 mcg total) into the muscle every 30 (thirty) days.   doxycycline  (VIBRA -TABS) 100 MG tablet Take 1 tablet (100 mg total) by mouth 2 (two) times daily. For 10 days. Take with full glass of water, stay upright 30 min after taking.   fluticasone (FLONASE) 50 MCG/ACT nasal spray Place 2 sprays into both nostrils daily.   furosemide  (LASIX ) 20 MG tablet Take 1 tablet by mouth once daily   losartan  (COZAAR ) 50 MG tablet Take 1 tablet by mouth once daily   magnesium  oxide (MAG-OX) 400 MG tablet Take 1 tablet (400 mg total) by mouth daily.   metoprolol  succinate (TOPROL -XL) 25 MG 24 hr tablet Take 1 tablet (25 mg total) by mouth daily. Take with or immediately following a meal.   Multiple Vitamin (MULTI-VITAMINS) TABS Take by mouth.   OHTUVAYRE  3 MG/2.5ML SUSP Take 3 mg by nebulization in the morning and at bedtime.   omeprazole  (PRILOSEC) 20 MG  capsule TAKE 1 CAPSULE BY MOUTH ONCE DAILY BEFORE BREAKFAST   potassium chloride  SA (KLOR-CON  M) 20 MEQ tablet Take 1 tablet by mouth once daily   predniSONE  (DELTASONE ) 20 MG tablet Take 2 tablets (40 mg total) by mouth daily with breakfast.   predniSONE  (STERAPRED UNI-PAK 21 TAB) 10 MG (21) TBPK tablet Per packaging instructions   rosuvastatin (CRESTOR) 40 MG tablet Take 40 mg by mouth daily.   RYBELSUS  3 MG TABS Take 1  tablet (3 mg total) by mouth daily.   SPIRIVA  HANDIHALER 18 MCG inhalation capsule Place 1 capsule (18 mcg total) into inhaler and inhale daily.   SYMBICORT 160-4.5 MCG/ACT inhaler Inhale 2 puffs into the lungs.   venlafaxine  XR (EFFEXOR -XR) 75 MG 24 hr capsule TAKE 3 CAPSULES BY MOUTH ONCE DAILY WITH BREAKFAST   [DISCONTINUED] doxycycline  (VIBRA -TABS) 100 MG tablet Take 1 tablet (100 mg total) by mouth 2 (two) times daily.   No facility-administered encounter medications on file as of 10/11/2023.    Allergies (verified) Moxifloxacin, Levofloxacin, and Amoxicillin -pot clavulanate   History: Past Medical History:  Diagnosis Date   Allergy    Anxiety    B12 deficiency    Carpal tunnel syndrome    Centrilobular emphysema (HCC)    COPD (chronic obstructive pulmonary disease) (HCC)    Depression    Genetic testing    Common Cancers panel (47 genes) @ [facility] - No pathogenic mutations detected   HLD (hyperlipidemia)    Hyperlipidemia    Hypertension    Iron deficiency anemia 10/16/2016   Lipoma of skin    Lung nodule    Normocytic anemia 10/16/2016   QT prolongation    Substance abuse (HCC)    Past Surgical History:  Procedure Laterality Date   ABDOMINAL HYSTERECTOMY     BREAST EXCISIONAL BIOPSY Left    1980's neg   CESAREAN SECTION     TUBAL LIGATION     Family History  Problem Relation Age of Onset   Ovarian cancer Mother 21       deceased 68   Breast cancer Mother 62   Stroke Father    Lung cancer Paternal Aunt    Lung cancer Cousin    Social History   Socioeconomic History   Marital status: Divorced    Spouse name: Not on file   Number of children: 1   Years of education: Not on file   Highest education level: Associate degree: occupational, Scientist, product/process development, or vocational program  Occupational History   Occupation: Retired  Tobacco Use   Smoking status: Every Day    Current packs/day: 0.50    Average packs/day: 0.5 packs/day for 52.0 years (26.0 ttl pk-yrs)     Types: Cigarettes   Smokeless tobacco: Never   Tobacco comments:    5 ciggarette a day--03/02/2020  Vaping Use   Vaping status: Some Days  Substance and Sexual Activity   Alcohol use: No   Drug use: No   Sexual activity: Not Currently  Other Topics Concern   Not on file  Social History Narrative   Not on file   Social Drivers of Health   Financial Resource Strain: Low Risk  (10/11/2023)   Overall Financial Resource Strain (CARDIA)    Difficulty of Paying Living Expenses: Not hard at all  Food Insecurity: No Food Insecurity (10/11/2023)   Hunger Vital Sign    Worried About Running Out of Food in the Last Year: Never true    Ran Out of Food in the  Last Year: Never true  Transportation Needs: No Transportation Needs (10/11/2023)   PRAPARE - Administrator, Civil Service (Medical): No    Lack of Transportation (Non-Medical): No  Physical Activity: Inactive (10/11/2023)   Exercise Vital Sign    Days of Exercise per Week: 0 days    Minutes of Exercise per Session: 0 min  Stress: No Stress Concern Present (10/11/2023)   [employer] of Occupational Health - Occupational Stress Questionnaire    Feeling of Stress: Not at all  Social Connections: Socially Isolated (10/11/2023)   Social Connection and Isolation Panel    Frequency of Communication with Friends and Family: Twice a week    Frequency of Social Gatherings with Friends and Family: Once a week    Attends Religious Services: Never    Database administrator or Organizations: No    Attends Engineer, structural: Never    Marital Status: Divorced    Tobacco Counseling Ready to quit: Not Answered Counseling given: Not Answered Tobacco comments: 5 ciggarette a day--03/02/2020    Clinical Intake:  Pre-visit preparation completed: Yes  Pain : No/denies pain Pain Score: 0-No pain     BMI - recorded: 33.75 Nutritional Status: BMI > 30  Obese Nutritional Risks: None Diabetes: No  Lab Results   Component Value Date   HGBA1C 6.9 (H) 10/09/2023   HGBA1C 7.1 (H) 08/27/2023   HGBA1C 7.1 (A) 06/24/2023     How often do you need to have someone help you when you read instructions, pamphlets, or other written materials from your doctor or pharmacy?: 1 - Never  Interpreter Needed?: No  Information entered by :: JHONNIE DAS, LPN   Activities of Daily Living     10/11/2023    2:59 PM 10/09/2023   12:36 PM  In your present state of health, do you have any difficulty performing the following activities:  Hearing? 0 0  Vision? 0 0  Difficulty concentrating or making decisions? 0 0  Walking or climbing stairs? 0 1  Dressing or bathing? 0 0  Doing errands, shopping? 0 0  Preparing Food and eating ? N N  Using the Toilet? N N  In the past six months, have you accidently leaked urine? N N  Do you have problems with loss of bowel control? N N  Managing your Medications? N N  Managing your Finances? N N  Housekeeping or managing your Housekeeping? N N    Patient Care Team: Edman Marsa PARAS, DO as PCP - General (Family Medicine) Verdene Gills, RN as Oncology Nurse Navigator Lenn Aran, MD as Radiation Oncologist (Radiation Oncology)  I have updated your Care Teams any recent Medical Services you may have received from other providers in the past year.     Assessment:   This is a routine wellness examination for [place].  Hearing/Vision screen Hearing Screening - Comments:: NO AIDS Vision Screening - Comments:: [facility]   Goals Addressed             This Visit's Progress    DIET - INCREASE WATER INTAKE         Depression Screen     10/11/2023    2:57 PM 09/04/2023    8:20 AM 06/24/2023    1:19 PM 03/25/2023    9:18 AM 09/17/2022    9:46 AM 09/13/2022    2:00 PM 01/05/2022   10:57 AM  PHQ 2/9 Scores  PHQ - 2 Score 0 0 0 0  6 1 2   PHQ- 9 Score 0 0   15 3 9     Fall Risk     10/11/2023    2:59 PM 10/09/2023   12:36 PM 09/04/2023    8:20  AM 03/25/2023    9:18 AM 09/17/2022    9:47 AM  Fall Risk   Falls in the past year? 0 0 0 0 1  Number falls in past yr: 0 0   0  Injury with Fall? 0 0   0  Risk for fall due to : No Fall Risks      Follow up Falls evaluation completed        MEDICARE RISK AT HOME:  Medicare Risk at Home Any stairs in or around the home?: Yes If so, are there any without handrails?: No Home free of loose throw rugs in walkways, pet beds, electrical cords, etc?: No Adequate lighting in your home to reduce risk of falls?: Yes Life alert?: No Use of a cane, walker or w/c?: No Grab bars in the bathroom?: Yes Shower chair or bench in shower?: Yes Elevated toilet seat or a handicapped toilet?: No  TIMED UP AND GO:  Was the test performed?  No  Cognitive Function: 6CIT completed        10/11/2023    3:01 PM 09/13/2022    2:07 PM 09/07/2021    2:36 PM  6CIT Screen  What Year? 0 points 0 points 0 points  What month? 0 points 0 points 0 points  What time? 0 points 0 points 0 points  Count back from 20 0 points 0 points 0 points  Months in reverse 0 points 0 points 0 points  Repeat phrase 0 points 2 points 0 points  Total Score 0 points 2 points 0 points    Immunizations Immunization History  Administered Date(s) Administered   Fluad Quad(high Dose 65+) 12/21/2020, 01/05/2022   Fluad Trivalent(High Dose 65+) 12/31/2017, 03/25/2023   Influenza Split 01/14/2009   Influenza, High Dose Seasonal PF 01/15/2017, 01/01/2020   Influenza, Seasonal, Injecte, Preservative Fre 01/20/2010, 12/07/2011   Influenza,inj,Quad PF,6+ Mos 01/07/2019   Influenza-Unspecified 03/16/2008, 12/28/2010, 01/03/2012, 12/25/2012, 12/15/2014, 01/14/2016, 01/15/2017, 12/31/2017   PFIZER Comirnaty(Gray Top)Covid-19 Tri-Sucrose Vaccine 04/28/2019, 05/19/2019, 01/01/2020   PFIZER(Purple Top)SARS-COV-2 Vaccination 04/28/2019, 05/19/2019   PNEUMOCOCCAL CONJUGATE-20 09/04/2023   Pfizer Covid-19 Vaccine Bivalent Booster 11yrs & up  03/07/2021   Pneumococcal Conjugate-13 02/16/2014   Pneumococcal Polysaccharide-23 09/01/2009, 04/27/2016   Rsv, Bivalent, Protein Subunit Rsvpref,pf [place]) 01/10/2022   Tdap 12/01/2009, 09/26/2010    Screening Tests Health Maintenance  Topic Date Due   OPHTHALMOLOGY EXAM  Never done   Zoster Vaccines- Shingrix (1 of 2) Never done   DTaP/Tdap/Td (3 - Td or Tdap) 09/25/2020   COVID-19 Vaccine (7 - 2024-25 season) 12/02/2022   MAMMOGRAM  09/03/2024 (Originally 11/08/2022)   DEXA SCAN  09/03/2024 (Originally 07/11/2014)   Hepatitis C Screening  09/03/2024 (Originally 07/11/1968)   INFLUENZA VACCINE  11/01/2023   HEMOGLOBIN A1C  04/10/2024   Diabetic kidney evaluation - Urine ACR  08/26/2024   FOOT EXAM  09/03/2024   Fecal DNA (Cologuard)  09/14/2024   Diabetic kidney evaluation - eGFR measurement  10/08/2024   Medicare Annual Wellness (AWV)  10/10/2024   Pneumococcal Vaccine: 50+ Years  Completed   Hepatitis B Vaccines  Aged Out   HPV VACCINES  Aged Out   Meningococcal B Vaccine  Aged Out    Health Maintenance  Health Maintenance Due  Topic Date Due  OPHTHALMOLOGY EXAM  Never done   Zoster Vaccines- Shingrix (1 of 2) Never done   DTaP/Tdap/Td (3 - Td or Tdap) 09/25/2020   COVID-19 Vaccine (7 - 2024-25 season) 12/02/2022   Health Maintenance Items Addressed: DECLINES MAMMOGRAM, COLONOSCOPY & BDS; NEEDS COVID & TDAP  Additional Screening:  Vision Screening: Recommended annual ophthalmology exams for early detection of glaucoma and other disorders of the eye. Would you like a referral to an eye doctor? No    Dental Screening: Recommended annual dental exams for proper oral hygiene  Community Resource Referral / Chronic Care Management: CRR required this visit?  No   CCM required this visit?  No   Plan:    I have personally reviewed and noted the following in the patient's chart:   Medical and social history Use of alcohol, tobacco or illicit drugs  Current  medications and supplements including opioid prescriptions. Patient is not currently taking opioid prescriptions. Functional ability and status Nutritional status Physical activity Advanced directives List of other physicians Hospitalizations, surgeries, and ER visits in previous 12 months Vitals Screenings to include cognitive, depression, and falls Referrals and appointments  In addition, I have reviewed and discussed with patient certain preventive protocols, quality metrics, and best practice recommendations. A written personalized care plan for preventive services as well as general preventive health recommendations were provided to patient.   Jhonnie GORMAN Das, LPN   2/88/7974   After Visit Summary: (MyChart) Due to this being a telephonic visit, the after visit summary with patients personalized plan was offered to patient via MyChart   Notes: Nothing significant to report at this time.

## 2023-10-11 NOTE — Progress Notes (Signed)
 Subjective:    Patient ID: Elizabeth [name], female    DOB: 06/17/1950, 73 y.o.   MRN: [id number]  Elizabeth [name] is a 73 y.o. female presenting on 10/11/2023 for Atrial Fibrillation   HPI  Discussed the use of AI scribe software for clinical note transcription with the patient, who gave verbal consent to proceed.  History of Present Illness   Elizabeth [name] is a 73 year old female with atrial fibrillation who presents with weakness and dizziness.   Atrial fibrillation , new onset Likely explanation of recent worsening dyspnea weakness dizziness. Visit here 10/09/23 acutely and had initial work up done. Sent to ED - Recent diagnosis of atrial fibrillation after emergency room visit - No current use of heart rate control medications such as metoprolol  - Unable to take anticoagulants due to easy bleeding tendency - Hemoglobin level recorded at 11.8 g/dL, slightly below normal with anemia - History of stroke approximately four years ago  Followed by Cardiology Dr Florencio at [facility]  Generalized weakness and dizziness - Weakness and dizziness present for the past three weeks - Symptoms have significantly limited daily activities, resulting in remaining in bed most of the time  Emphysema / Shortness of breath Baseline COPD. She is on several treatments with Symbicort, Spiriva , and recently added Ohtuvayre  but has not started this one yet. Followed by [facility] Shortness of breath present during the same period as other symptoms  Sinus symptoms and infection Persistent 3 week sinus drainage congestion  Recent emergency room visit and interventions - Received intravenous fluids and steroids in the emergency room - Chest x-ray performed and appeared clear - Prednisone  dose pack prescribed but not started due to unawareness of prescription        10/11/2023    2:57 PM 09/04/2023    8:20 AM 06/24/2023    1:19 PM  Depression screen PHQ 2/9  Decreased Interest 0  0 0  Down, Depressed, Hopeless 0 0 0  PHQ - 2 Score 0 0 0  Altered sleeping 0 0   Tired, decreased energy 0 0   Change in appetite 0 0   Feeling bad or failure about yourself  0 0   Trouble concentrating 0 0   Moving slowly or fidgety/restless 0 0   Suicidal thoughts 0 0   PHQ-9 Score 0 0   Difficult doing work/chores Not difficult at all Not difficult at all        09/04/2023    8:20 AM 06/24/2023    1:19 PM 03/25/2023    9:18 AM 09/17/2022    9:47 AM  GAD 7 : Generalized Anxiety Score  Nervous, Anxious, on Edge 0  0 3  Control/stop worrying 0  0 2  Worry too much - different things 0  0 1  Trouble relaxing 0  0 1  Restless 0  0 0  Easily annoyed or irritable 0  0 3  Afraid - awful might happen 0  0 0  Total GAD 7 Score 0  0 10  Anxiety Difficulty Not difficult at all Not difficult at all      Social History   Tobacco Use   Smoking status: Every Day    Current packs/day: 0.50    Average packs/day: 0.5 packs/day for 52.0 years (26.0 ttl pk-yrs)    Types: Cigarettes   Smokeless tobacco: Never   Tobacco comments:    5 ciggarette a day--03/02/2020  Vaping Use   Vaping status: Some Days  Substance  Use Topics   Alcohol use: No   Drug use: No    Review of Systems Per HPI unless specifically indicated above     Objective:    BP 134/70 (BP Location: Right Arm, Patient Position: Sitting, Cuff Size: Normal)   Pulse (!) 56   Ht 5' 2 (1.575 m)   Wt 184 lb 8 oz (83.7 kg)   SpO2 99%   BMI 33.75 kg/m   Wt Readings from Last 3 Encounters:  10/11/23 184 lb 8 oz (83.7 kg)  10/09/23 182 lb (82.6 kg)  10/09/23 183 lb 3.2 oz (83.1 kg)    Physical Exam Vitals and nursing note reviewed.  Constitutional:      General: She is not in acute distress.    Appearance: She is well-developed. She is not diaphoretic.     Comments: Well-appearing, comfortable, cooperative  HENT:     Head: Normocephalic and atraumatic.  Eyes:     General:        Right eye: No discharge.         Left eye: No discharge.     Conjunctiva/sclera: Conjunctivae normal.  Neck:     Thyroid : No thyromegaly.  Cardiovascular:     Rate and Rhythm: Normal rate. Rhythm irregular.     Heart sounds: Normal heart sounds. No murmur heard. Pulmonary:     Effort: Pulmonary effort is normal. No respiratory distress.     Breath sounds: No wheezing or rales.     Comments: Reduced air movement at baseline Musculoskeletal:        General: Normal range of motion.     Cervical back: Normal range of motion and neck supple.     Right lower leg: No edema.     Left lower leg: No edema.  Lymphadenopathy:     Cervical: No cervical adenopathy.  Skin:    General: Skin is warm and dry.     Findings: No erythema or rash.  Neurological:     Mental Status: She is alert and oriented to person, place, and time.  Psychiatric:        Behavior: Behavior normal.     Comments: Well groomed, good eye contact, normal speech and thoughts     Results for orders placed or performed during the hospital encounter of 10/09/23  Basic metabolic panel   Collection Time: 10/09/23  3:57 PM  Result Value Ref Range   Sodium 137 135 - 145 mmol/L   Potassium 3.2 (L) 3.5 - 5.1 mmol/L   Chloride 102 98 - 111 mmol/L   CO2 22 22 - 32 mmol/L   Glucose, Bld 159 (H) 70 - 99 mg/dL   BUN 10 8 - 23 mg/dL   Creatinine, Ser 9.11 0.44 - 1.00 mg/dL   Calcium 8.8 (L) 8.9 - 10.3 mg/dL   GFR, Estimated >39 >39 mL/min   Anion gap 13 5 - 15  CBC   Collection Time: 10/09/23  3:57 PM  Result Value Ref Range   WBC 9.8 4.0 - 10.5 K/uL   RBC 4.34 3.87 - 5.11 MIL/uL   Hemoglobin 11.8 (L) 12.0 - 15.0 g/dL   HCT 63.6 63.9 - 53.9 %   MCV 83.6 80.0 - 100.0 fL   MCH 27.2 26.0 - 34.0 pg   MCHC 32.5 30.0 - 36.0 g/dL   RDW 86.0 88.4 - 84.4 %   Platelets 268 150 - 400 K/uL   nRBC 0.0 0.0 - 0.2 %  Troponin I (High Sensitivity)   Collection  Time: 10/09/23  3:57 PM  Result Value Ref Range   Troponin I (High Sensitivity) 5 <18 ng/L  Troponin I  (High Sensitivity)   Collection Time: 10/09/23  6:04 PM  Result Value Ref Range   Troponin I (High Sensitivity) 5 <18 ng/L      Assessment & Plan:   Problem List Items Addressed This Visit     Weakness generalized   Other Visit Diagnoses       Atrial fibrillation with RVR (HCC)    -  Primary   Relevant Medications   metoprolol  succinate (TOPROL -XL) 25 MG 24 hr tablet     Acute non-recurrent frontal sinusitis       Relevant Medications   doxycycline  (VIBRA -TABS) 100 MG tablet        Atrial Fibrillation Newly diagnosed AFib with symptoms of weakness, dizziness, and fatigue. Increased stroke risk due to AFib and history of stroke. Anticoagulants contraindicated due to bleeding risk. Cardiologist follow-up required for management. - Contact Dr. Florencio to expedite cardiology appointment. - Patient also asked to call Dr Florencio [place] office to schedule ASAP - Start metoprolol  XL 25 mg daily for heart rate control.  Sinusitis Symptoms consistent with sinusitis, untreated during ER visit. - Prescribe doxycycline . - Instruct her to pick up prednisone  dose pack from pharmacy.  Anemia Mild anemia with hemoglobin at 11.8 g/dL. Not primary cause of symptoms, attributed to AFib.  Follow-up Cardiology follow-up needed for AFib management and treatment discussion. - She to follow up with Dr. Florencio for cardiology evaluation and AFib management.       No orders of the defined types were placed in this encounter.   Meds ordered this encounter  Medications   metoprolol  succinate (TOPROL -XL) 25 MG 24 hr tablet    Sig: Take 1 tablet (25 mg total) by mouth daily. Take with or immediately following a meal.    Dispense:  90 tablet    Refill:  1   doxycycline  (VIBRA -TABS) 100 MG tablet    Sig: Take 1 tablet (100 mg total) by mouth 2 (two) times daily. For 10 days. Take with full glass of water, stay upright 30 min after taking.    Dispense:  20 tablet    Refill:  0    Follow  up plan: Return if symptoms worsen or fail to improve.   Marsa Officer, DO [facility] 10/11/2023, 1:49 PM

## 2023-10-11 NOTE — Patient Instructions (Addendum)
 Elizabeth [name] , Thank you for taking time out of your busy schedule to complete your Annual Wellness Visit with me. I enjoyed our conversation and look forward to speaking with you again next year. I, as well as your care team,  appreciate your ongoing commitment to your health goals. Please review the following plan we discussed and let me know if I can assist you in the future.   Follow up Visits: Next Medicare AWV with our clinical staff:  10/23/24 @ 2:40 PM BY PHONE  Have you seen your provider in the last 6 months (3 months if uncontrolled diabetes)? Yes   Clinician Recommendations:  Aim for 30 minutes of exercise or brisk walking, 6-8 glasses of water, and 5 servings of fruits and vegetables each day. TAKE CARE!      This is a list of the screening recommended for you and due dates:  Health Maintenance  Topic Date Due   Eye exam for diabetics  Never done   Zoster (Shingles) Vaccine (1 of 2) Never done   DTaP/Tdap/Td vaccine (3 - Td or Tdap) 09/25/2020   COVID-19 Vaccine (7 - 2024-25 season) 12/02/2022   Mammogram  09/03/2024*   DEXA scan (bone density measurement)  09/03/2024*   Hepatitis C Screening  09/03/2024*   Flu Shot  11/01/2023   Hemoglobin A1C  04/10/2024   Yearly kidney health urinalysis for diabetes  08/26/2024   Complete foot exam   09/03/2024   Cologuard (Stool DNA test)  09/14/2024   Yearly kidney function blood test for diabetes  10/08/2024   Medicare Annual Wellness Visit  10/10/2024   Pneumococcal Vaccine for age over 31  Completed   Hepatitis B Vaccine  Aged Out   HPV Vaccine  Aged Out   Meningitis B Vaccine  Aged Out  *Topic was postponed. The date shown is not the original due date.    Advanced directives: (ACP Link)Information on Advanced Care Planning can be found at [place]  Secretary of [facility] Advance Health Care Directives Advance Health Care Directives. [email or web address]  Advance Care Planning is important because it:  [x]  Makes sure you receive the  medical care that is consistent with your values, goals, and preferences  [x]  It provides guidance to your family and loved ones and reduces their decisional burden about whether or not they are making the right decisions based on your wishes.  Follow the link provided in your after visit summary or read over the paperwork we have mailed to you to help you started getting your Advance Directives in place. If you need assistance in completing these, please reach out to us  so that we can help you!

## 2023-10-11 NOTE — Patient Instructions (Addendum)
 Thank you for coming to the office today.  Start taking Doxycycline  antibiotic 100mg  twice daily for 10 days. Take with full glass of water and stay upright for at least 30 min after taking, may be seated or standing, but should NOT lay down. This is just a safety precaution, if this medicine does not go all the way down throat well it could cause some burning discomfort to throat and esophagus. Also NOTE - do not take medicine within 2 hours (before or after) consuming dairy or foods / vitamins containing high calcium or iron.   Start Metoprolol  XL 25mg  daily for slowing down heart rate  Please call Dr Florencio and schedule sooner than you have current apt in December. You need to be seen within a few weeks or less.  Dr Florencio will discuss blood thinners or treatment options to stop the AFib   Please schedule a Follow-up Appointment to: Return if symptoms worsen or fail to improve.  If you have any other questions or concerns, please feel free to call the office or send a message through MyChart. You may also schedule an earlier appointment if necessary.  Additionally, you may be receiving a survey about your experience at our office within a few days to 1 week by e-mail or mail. We value your feedback.  Marsa Officer, DO [facility], [place]

## 2023-10-13 ENCOUNTER — Other Ambulatory Visit: Payer: Self-pay | Admitting: Family Medicine

## 2023-10-13 DIAGNOSIS — I10 Essential (primary) hypertension: Secondary | ICD-10-CM

## 2023-10-13 DIAGNOSIS — D510 Vitamin B12 deficiency anemia due to intrinsic factor deficiency: Secondary | ICD-10-CM

## 2023-10-15 DIAGNOSIS — I10 Essential (primary) hypertension: Secondary | ICD-10-CM | POA: Diagnosis not present

## 2023-10-15 DIAGNOSIS — E782 Mixed hyperlipidemia: Secondary | ICD-10-CM | POA: Diagnosis not present

## 2023-10-15 DIAGNOSIS — E669 Obesity, unspecified: Secondary | ICD-10-CM | POA: Diagnosis not present

## 2023-10-15 DIAGNOSIS — J9611 Chronic respiratory failure with hypoxia: Secondary | ICD-10-CM | POA: Diagnosis not present

## 2023-10-15 DIAGNOSIS — I2510 Atherosclerotic heart disease of native coronary artery without angina pectoris: Secondary | ICD-10-CM | POA: Diagnosis not present

## 2023-10-15 DIAGNOSIS — G4733 Obstructive sleep apnea (adult) (pediatric): Secondary | ICD-10-CM | POA: Diagnosis not present

## 2023-10-15 DIAGNOSIS — I4891 Unspecified atrial fibrillation: Secondary | ICD-10-CM | POA: Diagnosis not present

## 2023-10-15 DIAGNOSIS — R531 Weakness: Secondary | ICD-10-CM | POA: Diagnosis not present

## 2023-10-15 DIAGNOSIS — R600 Localized edema: Secondary | ICD-10-CM | POA: Diagnosis not present

## 2023-10-15 NOTE — Progress Notes (Signed)
 Established Patient Visit   Chief Complaint:No chief complaint on file.  Date of Service: 10/15/2023 Date of Birth: 11-Sep-1950 PCP: Edman Marsa PARAS, DO  History of Present Illness: Ms. Santo is a 73 y.o.female patient who presents for a 6 month follow up. PMH significant for hypertension, hyperlipidemia, prediabetes, morbid obesity, sleep apnea, and COPD.SABRA  Patient has hypercoagulable state with factor XI followed by Dr. Jacobo history of A-fib COPD obesity bronchitis previous lung cancer status post XRT obstructive sleep apnea anemia hypertension generalized weakness.  Patient found to have significant atrial fibrillation with factor XI  abnormality will need to consult with hematology about anticoagulation  Today, pt presents with recent a-fib. Endorses having some SOB. Slight swelling in feet. Will order echocardiogram and 72 hr holter for further assessment of a-fib.   Visit Summaries: 08/28/2023 Patient was seen by me for a follow up. Will increase Lasix  for 3 days. Order Cardiac MRI for further assessment of SOB. Stop simvastatin . Start Crestor .  Past Medical and Surgical History  Past Medical History Past Medical History:  Diagnosis Date  . Allergic state   . Arrhythmia   . Arthritis   . Cataract cortical, senile 2015?  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 COPD (chronic obstructive pulmonary disease) (CMS/HHS-HCC)   . Dyspepsia and other specified disorders of function of stomach 10/11/2010  . 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 Emphysema of lung (CMS/HHS-HCC)   . Essential hypertension, benign 10/11/2010  . GERD (gastroesophageal reflux disease)   . Heart murmur   . History of stroke eye stroke 06/2018  . Malignant neoplasm of lower lobe of left lung (CMS/HHS-HCC) 07/04/2021  . Mixed hyperlipidemia 10/11/2010  . Pernicious anemia 12/04/2016  . Simple chronic bronchitis (CMS/HHS-HCC) 10/11/2010  . Sinusitis, unspecified   . Sleep apnea   . Tobacco  use disorder 10/11/2010    Past Surgical History She has a past surgical history that includes Tonsillectomy; Tubal ligation; Cesarean section; Appendectomy; Adenoidectomy; Hysterectomy (age 31); Oophorectomy; left  carpal tunnel release  (Left, 07/04/2015); Breast excisional biopsy; Coronary angioplasty; and Fracture surgery (2004?).   Medications and Allergies  Current Medications  Current Outpatient Medications  Medication Sig Dispense Refill  . albuterol  (PROVENTIL ) 2.5 mg /3 mL (0.083 %) nebulizer solution Take 3 mLs (2.5 mg total) by nebulization every 6 (six) hours as needed for Wheezing or Shortness of Breath for up to 14 days 75 mL 0  . ARIPiprazole  (ABILIFY ) 5 MG tablet TAKE 1 TABLET EVERY DAY 90 tablet 3  . budesonide-formoteroL (SYMBICORT) 160-4.5 mcg/actuation inhaler Inhale 2 inhalations into the lungs 2 (two) times daily 10.2 g 12  . cyanocobalamin  (VITAMIN B12) 1,000 mcg/mL injection INJECT 1ML INTRAMUSCULARLY ONE TIME MONTHLY 3 mL 4  . fluticasone  (FLONASE) 50 mcg/actuation nasal spray Place 2 sprays into both nostrils once daily. 16 g 11  . FUROsemide  (LASIX ) 40 MG tablet Take 1 tablet (40 mg total) by mouth once daily 90 tablet 0  . losartan  (COZAAR ) 50 MG tablet Take 1 tablet by mouth once daily 90 tablet 0  . omeprazole  (PRILOSEC) 20 MG DR capsule Take 1 capsule by mouth once daily 90 capsule 3  . potassium chloride  (KLOR-CON  M20) 20 MEQ ER tablet Take 1 tablet (20 mEq total) by mouth once daily 90 tablet 3  . rosuvastatin  (CRESTOR ) 40 MG tablet Take 1 tablet (40 mg total) by mouth once daily 90 tablet 3  . SPIRIVA  WITH HANDIHALER 18 mcg inhalation capsule PLACE 1 CAPSULE IN INHALER AND INHALE  BY MOUTH INTO THE LUNGS ONCE DAILY 30 capsule 11  . venlafaxine  (EFFEXOR -XR) 75 MG XR capsule Take 3 capsules (225 mg total) by mouth once daily 90 capsule 11  . albuterol  (ACCUNEB ) 0.63 mg/3 mL nebulizer solution Inhale into the lungs every 6 (six) hours as needed (Patient not  taking: Reported on 10/15/2023)    . albuterol  (PROVENTIL  HFA) 90 mcg/actuation inhaler Inhale 2 inhalations into the lungs 4 (four) times daily as needed for Wheezing. 1 Inhaler 3   No current facility-administered medications for this visit.    Allergies: Moxifloxacin and Augmentin  [amoxicillin -pot clavulanate]  Social and Family History  Social History  reports that she has been smoking cigarettes. She has never used smokeless tobacco. She reports that she does not currently use alcohol. She reports that she does not use drugs.  Family History Family History  Problem Relation Name Age of Onset  . Anxiety Mother mary   . Depression Mother mary   . Cancer Mother mary   . Breast cancer Mother mary   . High blood pressure (Hypertension) Father cecil   . Skin cancer Father cecil   . Stroke Father cecil   . Drug abuse Father cecil   . Clotting disorder Father cecil   . Dementia Father cecil   . Kidney disease Father cecil   . Alzheimer's disease Father cecil   . Hyperlipidemia (Elevated cholesterol) Father cecil   . Drug abuse Sister ronda   . High blood pressure (Hypertension) Sister ronda   . Drug abuse Brother    . Anxiety Daughter shanon   . Bipolar disorder Daughter shanon   . Depression Daughter shanon   . High blood pressure (Hypertension) Daughter shanon   . Obesity Daughter shanon   . Coronary Artery Disease (Blocked arteries around heart) Brother Rick   . Anxiety Daughter mary   . Bipolar disorder Daughter mary   . Depression Daughter mary   . High blood pressure (Hypertension) Daughter mary   . Obesity Daughter mary   . Coronary Artery Disease (Blocked arteries around heart) Brother ricky        discovered in [place]  . High blood pressure (Hypertension) Brother ricky   . Emphysema Maternal Grandfather Victory     Review of Systems   Pertinent positives and negatives are mentioned above in HPI and all other systems are negative.  Physical Examination    Vitals:BP (!) 154/88 (BP Location: Left upper arm, Patient Position: Sitting, BP Cuff Size: Adult)   Pulse 95   Ht 157.5 cm (5' 2)   Wt 82.6 kg (182 lb 3.2 oz)   SpO2 97%   BMI 33.32 kg/m  Ht:157.5 cm (5' 2) Wt:82.6 kg (182 lb 3.2 oz) ADJ:Anib surface area is 1.9 meters squared. Body mass index is 33.32 kg/m.  HEENT: Pupils equally reactive to light and accomodation  Neck: Supple without thyromegaly, carotid pulses 2+ Lungs: clear to auscultation bilaterally; no wheezes, rales, rhonchi Heart: Regular rate and rhythm.  No gallops, murmurs or rub Abdomen: soft nontender, nondistended, with normal bowel sounds Extremities: no cyanosis, clubbing, or edema Peripheral Pulses: 2+ in all extremities, 2+ femoral pulses bilaterally Neurologic: Alert and oriented X3; speech intact; face symmetrical; moves all extremities well  Cardiovascular Studies:    Echocardiogram 2D complete: (07/05/2021) INTERPRETATION NORMAL LEFT VENTRICULAR SYSTOLIC FUNCTION   WITH MILD LVH NORMAL RIGHT VENTRICULAR SYSTOLIC FUNCTION TRIVIAL REGURGITATION NOTED (See above) NO VALVULAR STENOSIS ESTIMATED LVEF >55% (CALCULATED: 56.7%) GLS: -13.9% Aortic: NORMAL GRADIENTS Mitral: TRIVIAL MR  Tricuspid: TRACE TR Pulmonic: TRIVIAL PI Closest EF: >55% (Estimated) Mitral: TRIVIAL MR Tricuspid: TRIVIAL TR  NM Myocardial Perfusion SPECT multiple (stress and rest): (02/05/2022) IMPRESSION: Negative Lexiscan  stress. LV function normal. No evidence of ischemia. Low risk study.   Cardiac Catheterization:   Holter:  Cardiac CT Scan:  (08/09/2022) IMPRESSION:  1. Coronary calcium  score of 76.8. This was 60th percentile for age  and sex matched control.   2. Normal coronary origin with right dominance.   3. Mild proximal LAD stenosis (25%).   4. CAD-RADS 2. Mild non-obstructive CAD (25-49%). Consider  non-atherosclerotic causes of dyspnea. Consider preventive therapy  and risk factor modification.    Cardiac MRI:   Assessment   73 y.o. female with  1. Weakness generalized   2. Atrial fibrillation, unspecified type (CMS/HHS-HCC)   3. Coronary artery disease involving native coronary artery of native heart without angina pectoris   4. Chronic respiratory failure with hypoxia (CMS/HHS-HCC)   5. Essential hypertension, benign   6. Mixed hyperlipidemia   7. Obstructive sleep apnea syndrome   8. Obesity (BMI 30-39.9), unspecified   9. Edema of lower extremity   10. Gastroesophageal reflux disease, unspecified whether esophagitis present   11. History of tobacco use    Plan   A-fib, recent, ordered echocardiogram and 72 hr holter for further assessment  CAD, mild, continue crestor , losartan  Hypertension, today's BP was 154/88, continue losartan  Hyperlipidemia, continue crestor  therapy for lipid management  OSA, recommend sleep study, CPAP if indicated, weight loss Obesity, recommend weight loss, exercise, and portion control Edema, continue lasix  GERD, by history, continue omeprazole  therapy for reflux type symptoms  Smoking history, smokes around half a pack, recommend tobacco cessation    Return in about 1 month (around 11/15/2023).  This note is partially written by Leita Ellen, in the presence of and acting as the scribe of Dr. Cara Lovelace.      Leita Ellen  I have reviewed, edited and added to the note to reflect my best personal medical judgment.  Attestation Statement:   I personally performed the service. (TP)  DWAYNE JONETTA LOVELACE, MD  [facility] [place], [place] Ph:  [phone] Fax:  [phone] This note was generated in part with voice recognition software, Dragon.  I apologize for any typographical errors that were not detected and corrected from this process.  They are unintentional.

## 2023-10-15 NOTE — Telephone Encounter (Signed)
 Requested Prescriptions  Pending Prescriptions Disp Refills   MAGNESIUM -OXIDE 400 (240 Mg) MG tablet [Pharmacy Med Name: MAGnesium -Oxide 400 (241.3 Mg) MG Oral Tablet] 90 tablet 0    Sig: Take 1 tablet by mouth once daily     Endocrinology:  Minerals - Magnesium  Supplementation Failed - 10/15/2023 12:59 PM      Failed - Mg Level in normal range and within 360 days    No results found for: MG       Passed - Cr in normal range and within 360 days    Creat  Date Value Ref Range Status  08/27/2023 0.81 0.60 - 1.00 mg/dL Final   Creatinine, Ser  Date Value Ref Range Status  10/09/2023 0.88 0.44 - 1.00 mg/dL Final   Creatinine, Urine  Date Value Ref Range Status  08/27/2023 229 20 - 275 mg/dL Final         Passed - Valid encounter within last 12 months    Recent Outpatient Visits           4 days ago Atrial fibrillation with RVR (HCC)   [place] [facility] [place] Marsa PARAS, DO   6 days ago Weakness   [place], Parris LABOR, MD   1 month ago Annual physical exam   [place] [facility] [place] Marsa PARAS, DO   3 months ago Type 2 diabetes mellitus with other specified complication, without long-term current use of insulin (HCC)   [place] [facility] [place], Marsa PARAS, DO               losartan  (COZAAR ) 50 MG tablet [Pharmacy Med Name: Losartan  Potassium 50 MG Oral Tablet] 90 tablet 0    Sig: Take 1 tablet by mouth once daily     Cardiovascular:  Angiotensin Receptor Blockers Failed - 10/15/2023 12:59 PM      Failed - K in normal range and within 180 days    Potassium  Date Value Ref Range Status  10/09/2023 3.2 (L) 3.5 - 5.1 mmol/L Final  05/06/2014 3.5 3.5 - 5.1 mmol/L Final         Passed - Cr in normal range and within 180 days    Creat  Date Value Ref Range Status  08/27/2023 0.81 0.60 - 1.00 mg/dL Final   Creatinine, Ser  Date Value Ref Range  Status  10/09/2023 0.88 0.44 - 1.00 mg/dL Final   Creatinine, Urine  Date Value Ref Range Status  08/27/2023 229 20 - 275 mg/dL Final         Passed - Patient is not pregnant      Passed - Last BP in normal range    BP Readings from Last 1 Encounters:  10/11/23 134/70         Passed - Valid encounter within last 6 months    Recent Outpatient Visits           4 days ago Atrial fibrillation with RVR [facility])   [place] [facility] [place] Marsa PARAS, DO   6 days ago Weakness   [place] [facility] Parris LABOR, MD   1 month ago Annual physical exam   [place] [facility] [place] Marsa PARAS, DO   3 months ago Type 2 diabetes mellitus with other specified complication, without long-term current use of insulin [facility])   [place] [facility] [place], Marsa PARAS,  DO

## 2023-10-16 ENCOUNTER — Other Ambulatory Visit: Payer: Self-pay | Admitting: Family Medicine

## 2023-10-16 DIAGNOSIS — D510 Vitamin B12 deficiency anemia due to intrinsic factor deficiency: Secondary | ICD-10-CM

## 2023-10-17 NOTE — Telephone Encounter (Signed)
 Duplicate request, refilled 10/15/23.  Requested Prescriptions  Pending Prescriptions Disp Refills   MAGNESIUM -OXIDE 400 (240 Mg) MG tablet [Pharmacy Med Name: MAGnesium -Oxide 400 (241.3 Mg) MG Oral Tablet] 90 tablet 0    Sig: Take 1 tablet by mouth once daily     Endocrinology:  Minerals - Magnesium  Supplementation Failed - 10/17/2023  2:38 PM      Failed - Mg Level in normal range and within 360 days    No results found for: MG       Passed - Cr in normal range and within 360 days    Creat  Date Value Ref Range Status  08/27/2023 0.81 0.60 - 1.00 mg/dL Final   Creatinine, Ser  Date Value Ref Range Status  10/09/2023 0.88 0.44 - 1.00 mg/dL Final   Creatinine, Urine  Date Value Ref Range Status  08/27/2023 229 20 - 275 mg/dL Final         Passed - Valid encounter within last 12 months    Recent Outpatient Visits           6 days ago Atrial fibrillation with RVR [facility])   [place] [facility] [place] PARAS, DO   1 week ago Weakness   [facility] Ever[facility] Parris LABOR, MD   1 month ago Annual physical exam   [place] [facility] [place] Marsa PARAS, DO   3 months ago Type 2 diabetes mellitus with other specified complication, without long-term current use of insulin [facility])    [facility] [place], Marsa PARAS, [place]

## 2023-10-18 ENCOUNTER — Emergency Department

## 2023-10-18 ENCOUNTER — Encounter: Payer: Self-pay | Admitting: Oncology

## 2023-10-18 ENCOUNTER — Telehealth (HOSPITAL_COMMUNITY): Payer: Self-pay | Admitting: Pharmacy Technician

## 2023-10-18 ENCOUNTER — Inpatient Hospital Stay
Admission: EM | Admit: 2023-10-18 | Discharge: 2023-10-22 | DRG: 871 | Disposition: A | Discharge status: Home Health | Attending: Internal Medicine | Admitting: Internal Medicine

## 2023-10-18 ENCOUNTER — Other Ambulatory Visit: Payer: Self-pay

## 2023-10-18 ENCOUNTER — Other Ambulatory Visit (HOSPITAL_COMMUNITY): Payer: Self-pay

## 2023-10-18 DIAGNOSIS — J9601 Acute respiratory failure with hypoxia: Principal | ICD-10-CM | POA: Diagnosis present

## 2023-10-18 DIAGNOSIS — E66811 Obesity, class 1: Secondary | ICD-10-CM | POA: Diagnosis present

## 2023-10-18 DIAGNOSIS — J189 Pneumonia, unspecified organism: Secondary | ICD-10-CM | POA: Diagnosis present

## 2023-10-18 DIAGNOSIS — Z85118 Personal history of other malignant neoplasm of bronchus and lung: Secondary | ICD-10-CM

## 2023-10-18 DIAGNOSIS — A419 Sepsis, unspecified organism: Principal | ICD-10-CM | POA: Diagnosis present

## 2023-10-18 DIAGNOSIS — Z79899 Other long term (current) drug therapy: Secondary | ICD-10-CM

## 2023-10-18 DIAGNOSIS — E1169 Type 2 diabetes mellitus with other specified complication: Secondary | ICD-10-CM | POA: Diagnosis present

## 2023-10-18 DIAGNOSIS — J440 Chronic obstructive pulmonary disease with acute lower respiratory infection: Secondary | ICD-10-CM | POA: Diagnosis present

## 2023-10-18 DIAGNOSIS — E861 Hypovolemia: Secondary | ICD-10-CM | POA: Diagnosis not present

## 2023-10-18 DIAGNOSIS — R6520 Severe sepsis without septic shock: Secondary | ICD-10-CM | POA: Diagnosis present

## 2023-10-18 DIAGNOSIS — F17200 Nicotine dependence, unspecified, uncomplicated: Secondary | ICD-10-CM | POA: Diagnosis present

## 2023-10-18 DIAGNOSIS — J159 Unspecified bacterial pneumonia: Secondary | ICD-10-CM

## 2023-10-18 DIAGNOSIS — E8729 Other acidosis: Secondary | ICD-10-CM | POA: Diagnosis present

## 2023-10-18 DIAGNOSIS — Z6833 Body mass index (BMI) 33.0-33.9, adult: Secondary | ICD-10-CM

## 2023-10-18 DIAGNOSIS — F419 Anxiety disorder, unspecified: Secondary | ICD-10-CM | POA: Diagnosis present

## 2023-10-18 DIAGNOSIS — Z880 Allergy status to penicillin: Secondary | ICD-10-CM

## 2023-10-18 DIAGNOSIS — Z7951 Long term (current) use of inhaled steroids: Secondary | ICD-10-CM | POA: Diagnosis not present

## 2023-10-18 DIAGNOSIS — N179 Acute kidney failure, unspecified: Secondary | ICD-10-CM | POA: Diagnosis not present

## 2023-10-18 DIAGNOSIS — I4891 Unspecified atrial fibrillation: Secondary | ICD-10-CM | POA: Diagnosis not present

## 2023-10-18 DIAGNOSIS — I2489 Other forms of acute ischemic heart disease: Secondary | ICD-10-CM | POA: Diagnosis present

## 2023-10-18 DIAGNOSIS — I4581 Long QT syndrome: Secondary | ICD-10-CM | POA: Diagnosis not present

## 2023-10-18 DIAGNOSIS — J810 Acute pulmonary edema: Secondary | ICD-10-CM | POA: Diagnosis present

## 2023-10-18 DIAGNOSIS — K8020 Calculus of gallbladder without cholecystitis without obstruction: Secondary | ICD-10-CM | POA: Diagnosis not present

## 2023-10-18 DIAGNOSIS — Z7952 Long term (current) use of systemic steroids: Secondary | ICD-10-CM

## 2023-10-18 DIAGNOSIS — R140 Abdominal distension (gaseous): Secondary | ICD-10-CM | POA: Diagnosis not present

## 2023-10-18 DIAGNOSIS — R918 Other nonspecific abnormal finding of lung field: Secondary | ICD-10-CM | POA: Diagnosis not present

## 2023-10-18 DIAGNOSIS — Z801 Family history of malignant neoplasm of trachea, bronchus and lung: Secondary | ICD-10-CM

## 2023-10-18 DIAGNOSIS — J432 Centrilobular emphysema: Secondary | ICD-10-CM | POA: Diagnosis present

## 2023-10-18 DIAGNOSIS — R531 Weakness: Secondary | ICD-10-CM

## 2023-10-18 DIAGNOSIS — J441 Chronic obstructive pulmonary disease with (acute) exacerbation: Secondary | ICD-10-CM | POA: Diagnosis not present

## 2023-10-18 DIAGNOSIS — I110 Hypertensive heart disease with heart failure: Secondary | ICD-10-CM | POA: Diagnosis present

## 2023-10-18 DIAGNOSIS — F39 Unspecified mood [affective] disorder: Secondary | ICD-10-CM | POA: Diagnosis present

## 2023-10-18 DIAGNOSIS — I482 Chronic atrial fibrillation, unspecified: Secondary | ICD-10-CM

## 2023-10-18 DIAGNOSIS — R000 Tachycardia, unspecified: Secondary | ICD-10-CM | POA: Diagnosis not present

## 2023-10-18 DIAGNOSIS — J439 Emphysema, unspecified: Secondary | ICD-10-CM | POA: Diagnosis not present

## 2023-10-18 DIAGNOSIS — Z9851 Tubal ligation status: Secondary | ICD-10-CM

## 2023-10-18 DIAGNOSIS — E782 Mixed hyperlipidemia: Secondary | ICD-10-CM | POA: Diagnosis present

## 2023-10-18 DIAGNOSIS — D681 Hereditary factor XI deficiency: Secondary | ICD-10-CM | POA: Diagnosis not present

## 2023-10-18 DIAGNOSIS — Z8041 Family history of malignant neoplasm of ovary: Secondary | ICD-10-CM

## 2023-10-18 DIAGNOSIS — Z90710 Acquired absence of both cervix and uterus: Secondary | ICD-10-CM

## 2023-10-18 DIAGNOSIS — C3490 Malignant neoplasm of unspecified part of unspecified bronchus or lung: Secondary | ICD-10-CM | POA: Diagnosis not present

## 2023-10-18 DIAGNOSIS — R062 Wheezing: Secondary | ICD-10-CM | POA: Diagnosis not present

## 2023-10-18 DIAGNOSIS — G4733 Obstructive sleep apnea (adult) (pediatric): Secondary | ICD-10-CM | POA: Diagnosis present

## 2023-10-18 DIAGNOSIS — F17210 Nicotine dependence, cigarettes, uncomplicated: Secondary | ICD-10-CM | POA: Diagnosis present

## 2023-10-18 DIAGNOSIS — Z881 Allergy status to other antibiotic agents status: Secondary | ICD-10-CM

## 2023-10-18 DIAGNOSIS — I10 Essential (primary) hypertension: Secondary | ICD-10-CM | POA: Diagnosis not present

## 2023-10-18 DIAGNOSIS — I2510 Atherosclerotic heart disease of native coronary artery without angina pectoris: Secondary | ICD-10-CM | POA: Diagnosis not present

## 2023-10-18 DIAGNOSIS — R0989 Other specified symptoms and signs involving the circulatory and respiratory systems: Secondary | ICD-10-CM | POA: Diagnosis not present

## 2023-10-18 LAB — BLOOD GAS, VENOUS
Acid-base deficit: 9.6 mmol/L — ABNORMAL HIGH (ref 0.0–2.0)
Acid-base deficit: 4.2 mmol/L — ABNORMAL HIGH (ref 0.0–2.0)
Bicarbonate: 18.7 mmol/L — ABNORMAL LOW (ref 20.0–28.0)
Bicarbonate: 21 mmol/L (ref 20.0–28.0)
Delivery systems: POSITIVE
FIO2: 40 %
O2 Saturation: 81.7 %
O2 Saturation: 88 %
Patient temperature: 37
Patient temperature: 37
pCO2, Ven: 49 mmHg (ref 44–60)
pCO2, Ven: 38 mmHg — ABNORMAL LOW (ref 44–60)
pH, Ven: 7.19 — CL (ref 7.25–7.43)
pH, Ven: 7.35 (ref 7.25–7.43)
pO2, Ven: 57 mmHg — ABNORMAL HIGH (ref 32–45)
pO2, Ven: 57 mmHg — ABNORMAL HIGH (ref 32–45)

## 2023-10-18 LAB — HEMOGLOBIN A1C
Hgb A1c MFr Bld: 6.7 % — ABNORMAL HIGH (ref 4.8–5.6)
Mean Plasma Glucose: 145.59 mg/dL

## 2023-10-18 LAB — COMPREHENSIVE METABOLIC PANEL WITH GFR
ALT: 34 U/L (ref 0–44)
AST: 77 U/L — ABNORMAL HIGH (ref 15–41)
Albumin: 3.3 g/dL — ABNORMAL LOW (ref 3.5–5.0)
Alkaline Phosphatase: 105 U/L (ref 38–126)
Anion gap: 14 (ref 5–15)
BUN: 17 mg/dL (ref 8–23)
CO2: 19 mmol/L — ABNORMAL LOW (ref 22–32)
Calcium: 8.6 mg/dL — ABNORMAL LOW (ref 8.9–10.3)
Chloride: 105 mmol/L (ref 98–111)
Creatinine, Ser: 1.03 mg/dL — ABNORMAL HIGH (ref 0.44–1.00)
GFR, Estimated: 57 mL/min — ABNORMAL LOW (ref >60)
Glucose, Bld: 238 mg/dL — ABNORMAL HIGH (ref 70–99)
Potassium: 4.7 mmol/L (ref 3.5–5.1)
Sodium: 138 mmol/L (ref 135–145)
Total Bilirubin: 0.6 mg/dL (ref 0.0–1.2)
Total Protein: 6.6 g/dL (ref 6.5–8.1)

## 2023-10-18 LAB — CBC WITH DIFFERENTIAL/PLATELET
Abs Immature Granulocytes: 0.54 K/uL — ABNORMAL HIGH (ref 0.00–0.07)
Basophils Absolute: 0.1 K/uL (ref 0.0–0.1)
Basophils Relative: 0 %
Eosinophils Absolute: 0 K/uL (ref 0.0–0.5)
Eosinophils Relative: 0 %
HCT: 38.4 % (ref 36.0–46.0)
Hemoglobin: 12 g/dL (ref 12.0–15.0)
Immature Granulocytes: 2 %
Lymphocytes Relative: 44 %
Lymphs Abs: 9.9 K/uL — ABNORMAL HIGH (ref 0.7–4.0)
MCH: 27.2 pg (ref 26.0–34.0)
MCHC: 31.3 g/dL (ref 30.0–36.0)
MCV: 87.1 fL (ref 80.0–100.0)
Monocytes Absolute: 1.4 K/uL — ABNORMAL HIGH (ref 0.1–1.0)
Monocytes Relative: 6 %
Neutro Abs: 10.8 K/uL — ABNORMAL HIGH (ref 1.7–7.7)
Neutrophils Relative %: 48 %
Platelets: 379 K/uL (ref 150–400)
RBC: 4.41 MIL/uL (ref 3.87–5.11)
RDW: 14.4 % (ref 11.5–15.5)
Smear Review: NORMAL
WBC: 22.7 K/uL — ABNORMAL HIGH (ref 4.0–10.5)
nRBC: 0 % (ref 0.0–0.2)

## 2023-10-18 LAB — GLUCOSE, CAPILLARY
Glucose-Capillary: 222 mg/dL — ABNORMAL HIGH (ref 70–99)
Glucose-Capillary: 143 mg/dL — ABNORMAL HIGH (ref 70–99)

## 2023-10-18 LAB — TROPONIN I (HIGH SENSITIVITY): Troponin I (High Sensitivity): 29 ng/L — ABNORMAL HIGH (ref <18)

## 2023-10-18 LAB — STREP PNEUMONIAE URINARY ANTIGEN: Strep Pneumo Urinary Antigen: NEGATIVE

## 2023-10-18 LAB — PROTIME-INR
INR: 1 (ref 0.8–1.2)
Prothrombin Time: 14 s (ref 11.4–15.2)

## 2023-10-18 LAB — RESP PANEL BY RT-PCR (RSV, FLU A&B, COVID)  RVPGX2
Influenza A by PCR: NEGATIVE
Influenza B by PCR: NEGATIVE
Resp Syncytial Virus by PCR: NEGATIVE
SARS Coronavirus 2 by RT PCR: NEGATIVE

## 2023-10-18 LAB — LACTIC ACID, PLASMA
Lactic Acid, Venous: 7.9 mmol/L (ref 0.5–1.9)
Lactic Acid, Venous: 3.1 mmol/L (ref 0.5–1.9)

## 2023-10-18 LAB — MAGNESIUM: Magnesium: 2.9 mg/dL — ABNORMAL HIGH (ref 1.7–2.4)

## 2023-10-18 LAB — BRAIN NATRIURETIC PEPTIDE: B Natriuretic Peptide: 329.7 pg/mL — ABNORMAL HIGH (ref 0.0–100.0)

## 2023-10-18 LAB — PATHOLOGIST SMEAR REVIEW

## 2023-10-18 LAB — CBG MONITORING, ED
Glucose-Capillary: 142 mg/dL — ABNORMAL HIGH (ref 70–99)
Glucose-Capillary: 158 mg/dL — ABNORMAL HIGH (ref 70–99)

## 2023-10-18 LAB — PHOSPHORUS: Phosphorus: 6.8 mg/dL — ABNORMAL HIGH (ref 2.5–4.6)

## 2023-10-18 MED ORDER — NICOTINE 14 MG/24HR TD PT24
14.0000 mg | MEDICATED_PATCH | Freq: Every day | TRANSDERMAL | Status: DC
  Filled 2023-10-18 (×5): qty 1

## 2023-10-18 MED ORDER — INSULIN ASPART 100 UNIT/ML IJ SOLN
0.0000 [IU] | Freq: Three times a day (TID) | INTRAMUSCULAR | Status: DC
  Administered 2023-10-18: 3 [IU] via SUBCUTANEOUS
  Administered 2023-10-18: 5 [IU] via SUBCUTANEOUS
  Administered 2023-10-18: 2 [IU] via SUBCUTANEOUS
  Administered 2023-10-19 – 2023-10-21 (×4): 3 [IU] via SUBCUTANEOUS
  Administered 2023-10-22: 1 [IU] via SUBCUTANEOUS
  Filled 2023-10-18 (×8): qty 1

## 2023-10-18 MED ORDER — MAGNESIUM OXIDE -MG SUPPLEMENT 400 (240 MG) MG PO TABS
400.0000 mg | ORAL_TABLET | Freq: Every day | ORAL | Status: DC
  Administered 2023-10-19 – 2023-10-22 (×4): 400 mg via ORAL
  Filled 2023-10-18 (×5): qty 1

## 2023-10-18 MED ORDER — ARIPIPRAZOLE 5 MG PO TABS
5.0000 mg | ORAL_TABLET | Freq: Every day | ORAL | Status: DC
  Administered 2023-10-18 – 2023-10-22 (×5): 5 mg via ORAL
  Filled 2023-10-18 (×5): qty 1

## 2023-10-18 MED ORDER — MORPHINE SULFATE (PF) 2 MG/ML IV SOLN
1.0000 mg | Freq: Once | INTRAVENOUS | Status: AC
  Administered 2023-10-18: 1 mg via INTRAVENOUS
  Filled 2023-10-18: qty 1

## 2023-10-18 MED ORDER — PANTOPRAZOLE SODIUM 40 MG PO TBEC
40.0000 mg | DELAYED_RELEASE_TABLET | Freq: Every day | ORAL | Status: DC
  Administered 2023-10-18 – 2023-10-22 (×5): 40 mg via ORAL
  Filled 2023-10-18 (×5): qty 1

## 2023-10-18 MED ORDER — METOPROLOL SUCCINATE ER 25 MG PO TB24
25.0000 mg | ORAL_TABLET | Freq: Every day | ORAL | Status: DC
  Administered 2023-10-19 – 2023-10-22 (×4): 25 mg via ORAL
  Filled 2023-10-18 (×6): qty 1

## 2023-10-18 MED ORDER — LACTATED RINGERS IV BOLUS (SEPSIS)
1000.0000 mL | Freq: Once | INTRAVENOUS | Status: AC
  Administered 2023-10-18: 1000 mL via INTRAVENOUS

## 2023-10-18 MED ORDER — ADULT MULTIVITAMIN W/MINERALS CH
1.0000 | ORAL_TABLET | Freq: Every day | ORAL | Status: DC
  Administered 2023-10-19 – 2023-10-22 (×4): 1 via ORAL
  Filled 2023-10-18 (×5): qty 1

## 2023-10-18 MED ORDER — IPRATROPIUM-ALBUTEROL 0.5-2.5 (3) MG/3ML IN SOLN
3.0000 mL | Freq: Four times a day (QID) | RESPIRATORY_TRACT | Status: DC
  Administered 2023-10-18 – 2023-10-20 (×9): 3 mL via RESPIRATORY_TRACT
  Filled 2023-10-18 (×9): qty 3

## 2023-10-18 MED ORDER — UMECLIDINIUM BROMIDE 62.5 MCG/ACT IN AEPB
1.0000 | INHALATION_SPRAY | Freq: Every day | RESPIRATORY_TRACT | Status: DC
  Administered 2023-10-20 – 2023-10-22 (×3): 1 via RESPIRATORY_TRACT
  Filled 2023-10-18: qty 7

## 2023-10-18 MED ORDER — ACETAMINOPHEN 650 MG RE SUPP: 650.0000 mg | Freq: Four times a day (QID) | RECTAL | Status: DC | PRN

## 2023-10-18 MED ORDER — ROSUVASTATIN CALCIUM 10 MG PO TABS
40.0000 mg | ORAL_TABLET | Freq: Every day | ORAL | Status: DC
  Administered 2023-10-18 – 2023-10-22 (×5): 40 mg via ORAL
  Filled 2023-10-18 (×4): qty 4
  Filled 2023-10-18: qty 2

## 2023-10-18 MED ORDER — VENLAFAXINE HCL ER 75 MG PO CP24
225.0000 mg | ORAL_CAPSULE | Freq: Every day | ORAL | Status: DC
  Administered 2023-10-18 – 2023-10-22 (×5): 225 mg via ORAL
  Filled 2023-10-18 (×5): qty 3

## 2023-10-18 MED ORDER — POLYETHYLENE GLYCOL 3350 17 G PO PACK: 17.0000 g | PACK | Freq: Every day | ORAL | Status: DC | PRN

## 2023-10-18 MED ORDER — METHYLPREDNISOLONE SODIUM SUCC 125 MG IJ SOLR: 125.0000 mg | Freq: Once | INTRAMUSCULAR | Status: DC

## 2023-10-18 MED ORDER — METHYLPREDNISOLONE SODIUM SUCC 125 MG IJ SOLR
125.0000 mg | Freq: Every day | INTRAMUSCULAR | Status: DC
  Administered 2023-10-18 – 2023-10-19 (×2): 125 mg via INTRAVENOUS
  Filled 2023-10-18 (×2): qty 2

## 2023-10-18 MED ORDER — SODIUM CHLORIDE 0.9 % IV SOLN
2.0000 g | Freq: Once | INTRAVENOUS | Status: AC
  Administered 2023-10-18: 2 g via INTRAVENOUS
  Filled 2023-10-18: qty 20

## 2023-10-18 MED ORDER — SODIUM CHLORIDE 0.9 % IV BOLUS
500.0000 mL | Freq: Once | INTRAVENOUS | Status: AC
  Administered 2023-10-18: 500 mL via INTRAVENOUS

## 2023-10-18 MED ORDER — CYANOCOBALAMIN 1000 MCG/ML IJ SOLN
1000.0000 ug | INTRAMUSCULAR | Status: DC
  Filled 2023-10-18: qty 1

## 2023-10-18 MED ORDER — APIXABAN 5 MG PO TABS
5.0000 mg | ORAL_TABLET | Freq: Two times a day (BID) | ORAL | Status: DC
  Administered 2023-10-18 – 2023-10-22 (×9): 5 mg via ORAL
  Filled 2023-10-18 (×10): qty 1

## 2023-10-18 MED ORDER — FLUTICASONE FUROATE-VILANTEROL 200-25 MCG/ACT IN AEPB
1.0000 | INHALATION_SPRAY | Freq: Every day | RESPIRATORY_TRACT | Status: DC
  Administered 2023-10-18 – 2023-10-22 (×4): 1 via RESPIRATORY_TRACT
  Filled 2023-10-18: qty 28

## 2023-10-18 MED ORDER — ACETAMINOPHEN 325 MG PO TABS
650.0000 mg | ORAL_TABLET | Freq: Four times a day (QID) | ORAL | Status: DC | PRN
  Administered 2023-10-22: 650 mg via ORAL
  Filled 2023-10-18: qty 2

## 2023-10-18 MED ORDER — TIOTROPIUM BROMIDE MONOHYDRATE 18 MCG IN CAPS: 18.0000 ug | ORAL_CAPSULE | Freq: Every day | RESPIRATORY_TRACT | Status: DC

## 2023-10-18 MED ORDER — ENSIFENTRINE 3 MG/2.5ML IN SUSP: 3.0000 mg | Freq: Two times a day (BID) | RESPIRATORY_TRACT | Status: DC

## 2023-10-18 MED ORDER — SODIUM CHLORIDE 0.9 % IV BOLUS
1000.0000 mL | Freq: Once | INTRAVENOUS | Status: AC
  Administered 2023-10-18: 1000 mL via INTRAVENOUS

## 2023-10-18 MED ORDER — ALBUTEROL SULFATE (2.5 MG/3ML) 0.083% IN NEBU
2.5000 mg | INHALATION_SOLUTION | RESPIRATORY_TRACT | Status: DC | PRN
Start: 2023-10-18 — End: 2023-10-22
  Administered 2023-10-18 – 2023-10-20 (×4): 2.5 mg via RESPIRATORY_TRACT
  Filled 2023-10-18 (×3): qty 3

## 2023-10-18 NOTE — Progress Notes (Signed)
   10/18/23 2116  Assess: MEWS Score  Temp 97.9 F (36.6 C)  BP (!) 158/81  MAP (mmHg) 100  Pulse Rate (!) 124  Resp (!) 24  SpO2 98 %  O2 Device Nasal Cannula  O2 Flow Rate (L/min) 6 L/min  Assess: MEWS Score  MEWS Temp 0  MEWS Systolic 0  MEWS Pulse 2  MEWS RR 1  MEWS LOC 0  MEWS Score 3  MEWS Score Color Yellow  Assess: if the MEWS score is Yellow or Red  Were vital signs accurate and taken at a resting state? No, vital signs rechecked  Does the patient meet 2 or more of the SIRS criteria? No  MEWS guidelines implemented  Yes, yellow  Treat  MEWS Interventions Considered administering scheduled or prn medications/treatments as ordered  Take Vital Signs  Increase Vital Sign Frequency  Yellow: Q2hr x1, continue Q4hrs until patient remains green for 12hrs  Escalate  MEWS: Escalate Yellow: Discuss with charge nurse and consider notifying provider and/or RRT  Notify: Charge Nurse/RN  Name of Charge Nurse/RN Notified [facility] RN  Provider Notification  Provider Name/Title KATHEE Cone NP  Date Provider Notified 10/18/23  Time Provider Notified 2115  Method of Notification Page  Notification Reason Change in status  Provider response At bedside  Date of Provider Response 10/18/23  Time of Provider Response 2116  Assess: SIRS CRITERIA  SIRS Temperature  0  SIRS Respirations  1  SIRS Pulse 1  SIRS WBC 0  SIRS Score Sum  2

## 2023-10-18 NOTE — Sepsis Progress Note (Signed)
 Notified provider of need to order fluid bolus to complete weight based fluid needs.

## 2023-10-18 NOTE — ED Triage Notes (Signed)
 Arrives via EMS from home pt woke up and couldn't breath; EMS initial O2 62%   Hx of CPOD and AFib

## 2023-10-18 NOTE — Assessment & Plan Note (Signed)
 The patient does not use oxygen at home, and was saturating in the 60's on room air upon presentation and required BIPAP initially due to respiratory acidosis. She was ultimately converted to heated high flow. She is currently saturating at 97% on HHJF 30% at 50L/minute.

## 2023-10-18 NOTE — ED Notes (Signed)
Informed RN bed assigned 

## 2023-10-18 NOTE — Assessment & Plan Note (Signed)
 Pt will receive CPAP for sleep.

## 2023-10-18 NOTE — Assessment & Plan Note (Signed)
Nicotine patch will be made available.

## 2023-10-18 NOTE — ED Provider Notes (Addendum)
 [facility] Provider Note    Event Date/Time   First MD Initiated Contact with Patient 10/18/23 [phone]     (approximate)   History   Shortness of Breath   HPI Elizabeth [name] is a 73 y.o. female with history of COPD, HTN, HLD, A-fib, CHF presenting today for shortness of breath.  Patient reports she has had worsening shortness of breath symptoms over the past several days.  She has had productive cough with yellow sputum.  She has been doing breathing treatments at home without relief.  Denies congestion, fever, chills, chest pain, abdominal pain, nausea, vomiting, leg pain, leg swelling.  Reports she was in the ED 1 week ago for sinus symptoms with shortness of breath but otherwise had reassuring workup.  With EMS, they reported she was initially lethargic and found to have oxygenation in the 60s on room air.  Given albuterol , DuoNeb, magnesium  with improvement in symptoms.     Physical Exam   Triage Vital Signs: ED Triage Vitals  Encounter Vitals Group     BP 10/18/23 0553 (!) 130/103     Girls Systolic BP Percentile --      Girls Diastolic BP Percentile --      Boys Systolic BP Percentile --      Boys Diastolic BP Percentile --      Pulse Rate 10/18/23 0553 (!) 156     Resp 10/18/23 0553 (!) 21     Temp --      Temp src --      SpO2 10/18/23 0552 100 %     Weight --      Height --      Head Circumference --      Peak Flow --      Pain Score --      Pain Loc --      Pain Education --      Exclude from Growth Chart --     Most recent vital signs: Vitals:   10/18/23 0615 10/18/23 0645  BP:    Pulse: (!) 108 (!) 102  Resp: (!) 24 (!) 25  Temp:    SpO2: 100% 100%   Physical Exam: I have reviewed the vital signs and nursing notes. General: Awake, alert, acute respiratory distress. Head:  Atraumatic, normocephalic.   ENT:  EOM intact, PERRL. Oral mucosa is pink and moist with no lesions. Neck: Neck is supple with full range of motion, No  meningeal signs. Cardiovascular: Tachycardia, No murmurs. Peripheral pulses palpable and equal bilaterally. Respiratory: Tachypnea with diminished air movement in the bases.  No significant crackles present.  Expiratory wheezing noted throughout.  Accessory muscle usage present. Musculoskeletal:  No cyanosis or edema. Moving extremities with full ROM Abdomen:  Soft, nontender, nondistended. Neuro:  GCS 15, moving all four extremities, interacting appropriately. Speech clear. Psych:  Calm, appropriate.   Skin:  Warm, dry, no rash.    ED Results / Procedures / Treatments   Labs (all labs ordered are listed, but only abnormal results are displayed) Labs Reviewed  LACTIC ACID, PLASMA - Abnormal; Notable for the following components:      Result Value   Lactic Acid, Venous 7.9 (*)    All other components within normal limits  COMPREHENSIVE METABOLIC PANEL WITH GFR - Abnormal; Notable for the following components:   CO2 19 (*)    Glucose, Bld 238 (*)    Creatinine, Ser 1.03 (*)    Calcium 8.6 (*)    Albumin  3.3 (*)    AST 77 (*)    GFR, Estimated 57 (*)    All other components within normal limits  BLOOD GAS, VENOUS - Abnormal; Notable for the following components:   pH, Ven 7.19 (*)    pO2, Ven 57 (*)    Bicarbonate 18.7 (*)    Acid-base deficit 9.6 (*)    All other components within normal limits  CBC WITH DIFFERENTIAL/PLATELET - Abnormal; Notable for the following components:   WBC 22.7 (*)    All other components within normal limits  RESP PANEL BY RT-PCR (RSV, FLU A&B, COVID)  RVPGX2  CULTURE, BLOOD (ROUTINE X 2)  CULTURE, BLOOD (ROUTINE X 2)  PROTIME-INR  LACTIC ACID, PLASMA  CBC WITH DIFFERENTIAL/PLATELET  BRAIN NATRIURETIC PEPTIDE  TROPONIN I (HIGH SENSITIVITY)     EKG My EKG interpretation: Rate of 152, A-fib with RVR.  Left axis deviation.  No acute ST elevations or depressions   RADIOLOGY Chest x-ray pending at time of signout   PROCEDURES:  Critical  Care performed: Yes, see critical care procedure note(s)  .Critical Care  Performed by: Malvina Alm DASEN, MD Authorized by: Malvina Alm DASEN, MD   Critical care provider statement:    Critical care time (minutes):  35   Critical care was necessary to treat or prevent imminent or life-threatening deterioration of the following conditions:  Sepsis and respiratory failure   Critical care was time spent personally by me on the following activities:  Development of treatment plan with patient or surrogate, discussions with consultants, evaluation of patient's response to treatment, examination of patient, ordering and review of laboratory studies, ordering and review of radiographic studies, ordering and performing treatments and interventions, pulse oximetry, re-evaluation of patient's condition and review of old charts   I assumed direction of critical care for this patient from another provider in my specialty: no      MEDICATIONS ORDERED IN ED: Medications  lactated ringers bolus 1,000 mL (1,000 mLs Intravenous New Bag/Given 10/18/23 0619)  sodium chloride  0.9 % bolus 1,000 mL (has no administration in time range)  cefTRIAXone (ROCEPHIN) 2 g in sodium chloride  0.9 % 100 mL IVPB (2 g Intravenous New Bag/Given 10/18/23 0634)     IMPRESSION / MDM / ASSESSMENT AND PLAN / ED COURSE  I reviewed the triage vital signs and the nursing notes.                              Differential diagnosis includes, but is not limited to, COPD exacerbation, CHF exacerbation, pneumonia, COVID, flu, RSV  Patient's presentation is most consistent with acute presentation with potential threat to life or bodily function.  Patient is a 72 year old female presenting today in acute respiratory distress.  On exam she has wheezing noted throughout all lung fields likely indicating COPD is most likely source but will evaluate infectious etiologies contributing to this as well as any potential for signs of fluid overload.  No  edema to her lower extremities however.  She is oxygenating well on the BiPAP.  Will start with 1 L fluids and ceftriaxone.  Will hold off initially on albuterol  treatments as her heart rate is significantly elevated after receiving 3 treatments and she is feeling better.  Heart rate normalizing after receiving fluids and magnesium .  VBG shows pH of 7.19 but CO2 is only 49 and bicarb is 18 likely representing combination of respiratory and metabolic acidosis.  Suspect likely secondary  to sepsis.  Patient was reassessed with improvement in breathing symptoms while on BiPAP.  Laboratory workup shows lactic acidosis of 7.9 and patient given additional liter.  White blood cell count 22.7.  Chest x-ray confirms pneumonia.  Will admit to hospitalist for sepsis, pneumonia, hypoxic respiratory failure.  The patient is on the cardiac monitor to evaluate for evidence of arrhythmia and/or significant heart rate changes. Clinical Course as of 10/18/23 0709  Fri Oct 18, 2023  0615 Blood gas, venous(!!) Acidosis likely combination of respiratory and metabolic acidosis [DW]  0624 Reassessed and breathing significantly improved on the BiPAP. [DW]    Clinical Course User Index [DW] Malvina Alm DASEN, MD     FINAL CLINICAL IMPRESSION(S) / ED DIAGNOSES   Final diagnoses:  Acute hypoxic respiratory failure (HCC)  Sepsis, due to unspecified organism, unspecified whether acute organ dysfunction present (HCC)  COPD exacerbation (HCC)  Bacterial pneumonia     Rx / DC Orders   ED Discharge Orders     None        Note:  This document was prepared using Dragon voice recognition software and may include unintentional dictation errors.   Malvina Alm DASEN, MD 10/18/23 9354    Malvina Alm DASEN, MD 10/18/23 [id number]

## 2023-10-18 NOTE — ED Notes (Signed)
 Called and spoke to CCMD to place patient on monitoring.

## 2023-10-18 NOTE — Assessment & Plan Note (Signed)
 HR of 154, RR of 24, and a drop in systolic pressure from 130/103 to 86/52. She was also hypoxic requiring 40% on BIPAP.   In the ED the patient is now found to have increased streaky bilateral lower lunch opacity consistent with pneumonia. WBC count is 22.7. Her HbZ1c is 6.7. ABG demonstrated pH of 7.19 representing a respiratory acidosis. CO2 is 19 reflecting a metabolic acidosis. Initial lactic acid was 7.9. It came down to 3.1 after IV fluids. She has been given another 500 cc.

## 2023-10-18 NOTE — Assessment & Plan Note (Signed)
 The patient will receive IV steroids, scheduled duonebs, ensifentrine , and as needed albuterol .

## 2023-10-18 NOTE — Telephone Encounter (Signed)
 Pharmacy Patient Advocate Encounter  Insurance verification completed.    The patient is insured through [employer]. Patient has Medicare and is not eligible for a copay card, but may be able to apply for patient assistance or Medicare RX Payment Plan (Patient Must reach out to their plan, if eligible for payment plan), if available.    Ran test claim for Eliquis 5mg  tablets and the current 30 day co-pay is $40.00.   This test claim was processed through [facility]- copay amounts may vary at other pharmacies due to pharmacy/plan contracts, or as the patient moves through the different stages of their insurance plan.

## 2023-10-18 NOTE — Sepsis Progress Note (Signed)
 Elink monitoring for the code sepsis protocol.

## 2023-10-18 NOTE — ED Notes (Signed)
 Pt unable to finish taking all PO meds, reports increased shob, increased RR noted. Will place back on bipap

## 2023-10-18 NOTE — Assessment & Plan Note (Signed)
 The patient will be placed on sliding scale insulin to address her glucoses. She is a diet controlled diabetic ordinarily, but she will be receiving steroids and her glucoses can get quite high.

## 2023-10-18 NOTE — ED Notes (Signed)
 Pt 100% on bipap, titrated to 3L [place]. Ginny- medic made aware

## 2023-10-18 NOTE — ED Notes (Signed)
 Patient tolerating HHF very well. Meal tray delivered and patient feeding self.

## 2023-10-18 NOTE — Assessment & Plan Note (Signed)
 Due to sepsis, pneumonia, hypoxia, and COPD exacerbation. Will have patient evaluated by PT/OT.

## 2023-10-18 NOTE — Assessment & Plan Note (Signed)
 The patient will receive IV Rocephin and Azithromycin . Blood cultures x 2 will be obtained.

## 2023-10-18 NOTE — Consult Note (Signed)
 CODE SEPSIS - PHARMACY COMMUNICATION  **Broad Spectrum Antibiotics should be administered within 1 hour of Sepsis diagnosis**  Time Code Sepsis Called/Page Received: 9443  Antibiotics Ordered: ceftriaxone  Time of 1st antibiotic administration: 0634  Additional action taken by pharmacy: N/A   Kayla JULIANNA Blew ,PharmD Clinical Pharmacist  10/18/2023  7:23 AM

## 2023-10-18 NOTE — ED Notes (Signed)
 Patient pulled out PIV L hand. Bleeding controlled and dressings applied.

## 2023-10-18 NOTE — Assessment & Plan Note (Signed)
 Continue statin as prior to presentation.

## 2023-10-18 NOTE — H&P (Signed)
 History and Physical    Patient: Elizabeth [name] FMW:[id number] DOB: 1950/05/21 DOA: 10/18/2023 DOS: the patient was seen and examined on 10/18/2023 PCP: Edman Marsa PARAS, DO  Patient coming from: Home  Chief Complaint:  Chief Complaint  Patient presents with   Shortness of Breath   HPI: Elizabeth [name] is a 73 y.o. female with medical history significant of new diagnosis of atrial fibrillation, DM II, COPD, LLL Ca, Morbid obesity, OSA, Pernicious, normocytic, and iron deficiency anemia. Congenital factor XI deficiency, Dyspnea, hypertension, hyperlipidemia, anxiety, tobacco abuse. The patient has been having progressive shortness of breath for which she has sought outpatient treatment on 10/09/2023 and 10/11/2023 for increasing shortness of breath. She was given oral doxycycline  and augmentin  as well as a prednisone  taper. Despite this the patient's shortness of breath has only worsened. She also states that she feels dizzy and generalized weak.   Upon arrival at ED the patient had a HR of 154, RR of 24, and a drop in systolic pressure from 130/103 to 86/52. She was also hypoxic requiring 40% on BIPAP.   In the ED the patient is now found to have increased streaky bilateral lower lunch opacity consistent with pneumonia. WBC count is 22.7. Her HbZ1c is 6.7. ABG demonstrated pH of 7.19 representing a respiratory acidosis. CO2 is 19 reflecting a metabolic acidosis. Initial lactic acid was 7.9. It came down to 3.1 after IV fluids. She has been given another 500 cc.  She states that she has felt hot and cold at home, but has not taken her temperature.  Review of Systems: As mentioned in the history of present illness. All other systems reviewed and are negative. Past Medical History:  Diagnosis Date   Allergy    Anxiety    B12 deficiency    Carpal tunnel syndrome    Centrilobular emphysema (HCC)    COPD (chronic obstructive pulmonary disease) (HCC)    Depression    Genetic testing     Common Cancers panel (47 genes) @ [facility] - No pathogenic mutations detected   HLD (hyperlipidemia)    Hyperlipidemia    Hypertension    Iron deficiency anemia 10/16/2016   Lipoma of skin    Lung nodule    Normocytic anemia 10/16/2016   QT prolongation    Substance abuse (HCC)    Past Surgical History:  Procedure Laterality Date   ABDOMINAL HYSTERECTOMY     BREAST EXCISIONAL BIOPSY Left    1980's neg   CESAREAN SECTION     TUBAL LIGATION     Social History:  reports that she has been smoking cigarettes. She has a 26 pack-year smoking history. She has never used smokeless tobacco. She reports that she does not drink alcohol and does not use drugs.  Allergies  Allergen Reactions   Moxifloxacin Itching, Other (See Comments), Photosensitivity, Rash and Swelling    Causes swelling, redness, burning in eyes    Levofloxacin Other (See Comments)    Other reaction(s): Muscle Pain  Other reaction(s): Muscle Pain Other reaction(s): Muscle Pain   Amoxicillin -Pot Clavulanate Nausea Only and Other (See Comments)    Other Reaction: DIARRHEA.  Other reaction(s): Other (See Comments)  Other Reaction: DIARRHEA. Other reaction(s): Other (See Comments) Other Reaction: DIARRHEA. Other Reaction: DIARRHEA.  Other reaction(s): Other (See Comments) Other Reaction: DIARRHEA.    Other Reaction: DIARRHEA. Other Reaction: DIARRHEA.  Other reaction(s): Other (See Comments) Other Reaction: DIARRHEA.    Family History  Problem Relation Age of Onset   Ovarian cancer Mother  58       deceased 23   Breast cancer Mother 38   Stroke Father    Lung cancer Paternal Aunt    Lung cancer Cousin     Prior to Admission medications   Medication Sig Start Date End Date Taking? Authorizing Provider  albuterol  (VENTOLIN  HFA) 108 (90 Base) MCG/ACT inhaler Inhale 2 puffs into the lungs every 6 (six) hours as needed for wheezing or shortness of breath. 03/21/23  Yes Karamalegos, Marsa PARAS, DO  ARIPiprazole   (ABILIFY ) 5 MG tablet Take 1 tablet by mouth once daily 05/14/23  Yes Karamalegos, Marsa PARAS, DO  benzonatate  (TESSALON ) 100 MG capsule Take 1 capsule (100 mg total) by mouth 3 (three) times daily as needed for cough. 09/24/23  Yes Gladis Elsie BROCKS, PA-C  cyanocobalamin  (VITAMIN B12) 1000 MCG/ML injection Inject 1 mL (1,000 mcg total) into the muscle every 30 (thirty) days. 10/04/23  Yes Reyes, Roosvelt, PA-C  doxycycline  (VIBRA -TABS) 100 MG tablet Take 1 tablet (100 mg total) by mouth 2 (two) times daily. For 10 days. Take with full glass of water, stay upright 30 min after taking. 10/11/23  Yes Karamalegos, Alexander J, DO  fluticasone (FLONASE) 50 MCG/ACT nasal spray Place 2 sprays into both nostrils daily. 10/10/16  Yes [provider]  furosemide  (LASIX ) 40 MG tablet Take 40 mg by mouth.  Take 1 tablet (40 mg total) by mouth once daily 08/28/23  Yes [provider]  losartan  (COZAAR ) 50 MG tablet Take 1 tablet by mouth once daily 10/15/23  Yes Karamalegos, Marsa PARAS, DO  MAGNESIUM -OXIDE 400 (240 Mg) MG tablet Take 1 tablet by mouth once daily 10/15/23  Yes Karamalegos, Marsa PARAS, DO  metoprolol  succinate (TOPROL -XL) 25 MG 24 hr tablet Take 1 tablet (25 mg total) by mouth daily. Take with or immediately following a meal. 10/11/23  Yes Karamalegos, Marsa PARAS, DO  Multiple Vitamin (MULTI-VITAMINS) TABS Take by mouth.   Yes [provider]  Nebulizers (PARI PRONEB MAX LC SPRINT) MISC  09/23/23  Yes [provider]  OHTUVAYRE  3 MG/2.5ML SUSP Take 3 mg by nebulization in the morning and at bedtime. 09/04/23  Yes Karamalegos, Marsa PARAS, DO  omeprazole  (PRILOSEC) 20 MG capsule TAKE 1 CAPSULE BY MOUTH ONCE DAILY BEFORE BREAKFAST 09/10/23  Yes Karamalegos, Marsa PARAS, DO  potassium chloride  SA (KLOR-CON  M) 20 MEQ tablet Take 1 tablet by mouth once daily 09/10/23  Yes Karamalegos, Marsa PARAS, DO  predniSONE  (DELTASONE ) 20 MG tablet Take 2 tablets (40 mg total) by mouth daily  with breakfast. 09/24/23  Yes Gladis Elsie BROCKS, PA-C  predniSONE  (STERAPRED UNI-PAK 21 TAB) 10 MG (21) TBPK tablet Per packaging instructions 10/09/23  Yes Goodman, Graydon, MD  rosuvastatin (CRESTOR) 40 MG tablet Take 40 mg by mouth daily. 08/28/23 08/27/24 Yes [provider]  SPIRIVA  HANDIHALER 18 MCG inhalation capsule Place 1 capsule (18 mcg total) into inhaler and inhale daily. 12/20/22  Yes Karamalegos, Marsa PARAS, DO  SYMBICORT 160-4.5 MCG/ACT inhaler Inhale 2 puffs into the lungs. 04/02/22  Yes [provider]  venlafaxine  XR (EFFEXOR -XR) 75 MG 24 hr capsule TAKE 3 CAPSULES BY MOUTH ONCE DAILY WITH BREAKFAST 06/10/23  Yes Karamalegos, Marsa PARAS, DO  amoxicillin -clavulanate (AUGMENTIN ) 875-125 MG tablet Take 1 tablet by mouth 2 (two) times daily. Patient not taking: Reported on 10/18/2023 09/30/23   Vivienne Delon HERO, PA-C  RYBELSUS  3 MG TABS Take 1 tablet (3 mg total) by mouth daily. 09/04/23   Edman Marsa PARAS, DO  Physical Exam: Vitals:   10/18/23 1030 10/18/23 1203 10/18/23 1330 10/18/23 1452  BP: 103/78 126/77 117/83 126/83  Pulse: 79 99 93 94  Resp: 20 (!) 22 20 20   Temp:      TempSrc:      SpO2: 97% 100% 100% 99%  Weight:      Height:       Exam:  Constitutional:  The patient is awake, alert, and oriented x 3. She is on BIPAP. She is in mild respiratory distress currently. Eyes:  pupils and irises appear normal Normal lids and conjunctivae Neck:  neck appears normal, no masses, normal ROM, supple no thyromegaly Respiratory:  Positive for accessory muscle use. Diminished breath sounds bilaterally.  No wheezes or rhonchi. Positive for rales at bases bilaterally. No tactile fremitus Cardiovascular:  Regular rate and rhythm No murmurs, ectopy, or gallups. No lateral PMI. No thrills. Abdomen:  Abdomen is soft, non-tender, non-distended No hernias, masses, or organomegaly Normoactive bowel sounds.  Musculoskeletal:  No cyanosis, clubbing,  or edema Skin:  No rashes, lesions, ulcers palpation of skin: no induration or nodules Neurologic:  CN 2-12 intact Sensation all 4 extremities intact Psychiatric:  Mental status Mood, affect appropriate Orientation to person, place, time  judgment and insight appear intact  Data Reviewed:  CXR CBC CMP ABG  Assessment and Plan: Severe sepsis (HCC) HR of 154, RR of 24, and a drop in systolic pressure from 130/103 to 86/52. She was also hypoxic requiring 40% on BIPAP.   In the ED the patient is now found to have increased streaky bilateral lower lunch opacity consistent with pneumonia. WBC count is 22.7. Her HbZ1c is 6.7. ABG demonstrated pH of 7.19 representing a respiratory acidosis. CO2 is 19 reflecting a metabolic acidosis. Initial lactic acid was 7.9. It came down to 3.1 after IV fluids. She has been given another 500 cc.  Tobacco use disorder Nicotine patch will be made available.  Type 2 diabetes mellitus with other specified complication (HCC) The patient will be placed on sliding scale insulin to address her glucoses. She is a diet controlled diabetic ordinarily, but she will be receiving steroids and her glucoses can get quite high.  OSA on CPAP Pt will receive CPAP for sleep.  COPD with acute exacerbation (HCC) The patient will receive IV steroids, scheduled duonebs, ensifentrine , and as needed albuterol .   CAP (community acquired pneumonia) The patient will receive IV Rocephin and Azithromycin . Blood cultures x 2 will be obtained.   Mixed hyperlipidemia Continue statin as prior to presentation.  Weakness generalized Due to sepsis, pneumonia, hypoxia, and COPD exacerbation. Will have patient evaluated by PT/OT.  Acute hypoxic respiratory failure (HCC) The patient does not use oxygen at home, and was saturating in the 60's on room air upon presentation and required BIPAP initially due to respiratory acidosis. She was ultimately converted to heated high flow. She  is currently saturating at 97% on HHJF 30% at 50L/minute.    Advance Care Planning:   Code Status: Full Code   Consults: None  Family Communication: None available.  Severity of Illness: The appropriate patient status for this patient is INPATIENT. Inpatient status is judged to be reasonable and necessary in order to provide the required intensity of service to ensure the patient's safety. The patient's presenting symptoms, physical exam findings, and initial radiographic and laboratory data in the context of their chronic comorbidities is felt to place them at high risk for further clinical deterioration. Furthermore, it is not anticipated that the  patient will be medically stable for discharge from the hospital within 2 midnights of admission.   * I certify that at the point of admission it is my clinical judgment that the patient will require inpatient hospital care spanning beyond 2 midnights from the point of admission due to high intensity of service, high risk for further deterioration and high frequency of surveillance required.*  Author: Breydan Shillingburg, DO 10/18/2023 3:38 PM  For on call review [email or web address].

## 2023-10-19 ENCOUNTER — Inpatient Hospital Stay

## 2023-10-19 ENCOUNTER — Inpatient Hospital Stay
Admit: 2023-10-19 | Discharge: 2023-10-19 | Disposition: A | Discharge status: Home / Self Care | Attending: Acute Care | Admitting: Acute Care

## 2023-10-19 DIAGNOSIS — J9601 Acute respiratory failure with hypoxia: Secondary | ICD-10-CM | POA: Diagnosis not present

## 2023-10-19 DIAGNOSIS — R000 Tachycardia, unspecified: Secondary | ICD-10-CM

## 2023-10-19 DIAGNOSIS — I4581 Long QT syndrome: Secondary | ICD-10-CM

## 2023-10-19 LAB — BLOOD GAS, ARTERIAL
Acid-base deficit: 5.8 mmol/L — ABNORMAL HIGH (ref 0.0–2.0)
Bicarbonate: 20.1 mmol/L (ref 20.0–28.0)
FIO2: 100 %
O2 Saturation: 99.6 %
Patient temperature: 37
pCO2 arterial: 40 mmHg (ref 32–48)
pH, Arterial: 7.31 — ABNORMAL LOW (ref 7.35–7.45)
pO2, Arterial: 205 mmHg — ABNORMAL HIGH (ref 83–108)

## 2023-10-19 LAB — BASIC METABOLIC PANEL WITH GFR
Anion gap: 12 (ref 5–15)
BUN: 25 mg/dL — ABNORMAL HIGH (ref 8–23)
CO2: 20 mmol/L — ABNORMAL LOW (ref 22–32)
Calcium: 8.2 mg/dL — ABNORMAL LOW (ref 8.9–10.3)
Chloride: 109 mmol/L (ref 98–111)
Creatinine, Ser: 1.1 mg/dL — ABNORMAL HIGH (ref 0.44–1.00)
GFR, Estimated: 53 mL/min — ABNORMAL LOW (ref >60)
Glucose, Bld: 153 mg/dL — ABNORMAL HIGH (ref 70–99)
Potassium: 4.7 mmol/L (ref 3.5–5.1)
Sodium: 141 mmol/L (ref 135–145)

## 2023-10-19 LAB — CBC WITH DIFFERENTIAL/PLATELET
Abs Immature Granulocytes: 0.21 K/uL — ABNORMAL HIGH (ref 0.00–0.07)
Basophils Absolute: 0 K/uL (ref 0.0–0.1)
Basophils Relative: 0 %
Eosinophils Absolute: 0 K/uL (ref 0.0–0.5)
Eosinophils Relative: 0 %
HCT: 33.3 % — ABNORMAL LOW (ref 36.0–46.0)
Hemoglobin: 10.8 g/dL — ABNORMAL LOW (ref 12.0–15.0)
Immature Granulocytes: 1 %
Lymphocytes Relative: 10 %
Lymphs Abs: 2.5 K/uL (ref 0.7–4.0)
MCH: 27.3 pg (ref 26.0–34.0)
MCHC: 32.4 g/dL (ref 30.0–36.0)
MCV: 84.1 fL (ref 80.0–100.0)
Monocytes Absolute: 2 K/uL — ABNORMAL HIGH (ref 0.1–1.0)
Monocytes Relative: 8 %
Neutro Abs: 20.8 K/uL — ABNORMAL HIGH (ref 1.7–7.7)
Neutrophils Relative %: 81 %
Platelets: 295 K/uL (ref 150–400)
RBC: 3.96 MIL/uL (ref 3.87–5.11)
RDW: 14.7 % (ref 11.5–15.5)
Smear Review: NORMAL
WBC: 25.5 K/uL — ABNORMAL HIGH (ref 4.0–10.5)
nRBC: 0 % (ref 0.0–0.2)

## 2023-10-19 LAB — GLUCOSE, CAPILLARY
Glucose-Capillary: 107 mg/dL — ABNORMAL HIGH (ref 70–99)
Glucose-Capillary: 180 mg/dL — ABNORMAL HIGH (ref 70–99)
Glucose-Capillary: 169 mg/dL — ABNORMAL HIGH (ref 70–99)
Glucose-Capillary: 128 mg/dL — ABNORMAL HIGH (ref 70–99)

## 2023-10-19 LAB — TROPONIN I (HIGH SENSITIVITY)
Troponin I (High Sensitivity): 332 ng/L (ref <18)
Troponin I (High Sensitivity): 306 ng/L (ref <18)
Troponin I (High Sensitivity): 288 ng/L (ref <18)

## 2023-10-19 LAB — PROCALCITONIN: Procalcitonin: 0.23 ng/mL

## 2023-10-19 LAB — LACTIC ACID, PLASMA: Lactic Acid, Venous: 2.4 mmol/L (ref 0.5–1.9)

## 2023-10-19 LAB — D-DIMER, QUANTITATIVE: D-Dimer, Quant: 1.94 ug{FEU}/mL — ABNORMAL HIGH (ref 0.00–0.50)

## 2023-10-19 LAB — MAGNESIUM: Magnesium: 2.7 mg/dL — ABNORMAL HIGH (ref 1.7–2.4)

## 2023-10-19 LAB — LEGIONELLA PNEUMOPHILA SEROGP 1 UR AG: L. pneumophila Serogp 1 Ur Ag: NEGATIVE

## 2023-10-19 MED ORDER — MORPHINE SULFATE (PF) 2 MG/ML IV SOLN
1.0000 mg | Freq: Once | INTRAVENOUS | Status: AC
  Administered 2023-10-19: 1 mg via INTRAVENOUS
  Filled 2023-10-19: qty 1

## 2023-10-19 MED ORDER — IOHEXOL 350 MG/ML SOLN
100.0000 mL | Freq: Once | INTRAVENOUS | Status: AC | PRN
  Administered 2023-10-19: 100 mL via INTRAVENOUS

## 2023-10-19 MED ORDER — VANCOMYCIN HCL 2000 MG/400ML IV SOLN
2000.0000 mg | Freq: Once | INTRAVENOUS | Status: AC
  Administered 2023-10-19: 2000 mg via INTRAVENOUS
  Filled 2023-10-19 (×2): qty 400

## 2023-10-19 MED ORDER — SODIUM CHLORIDE 0.9 % IV SOLN
2.0000 g | Freq: Two times a day (BID) | INTRAVENOUS | Status: DC
  Administered 2023-10-19 – 2023-10-22 (×7): 2 g via INTRAVENOUS
  Filled 2023-10-19 (×9): qty 12.5

## 2023-10-19 MED ORDER — METOPROLOL TARTRATE 5 MG/5ML IV SOLN
2.5000 mg | Freq: Once | INTRAVENOUS | Status: AC
  Administered 2023-10-19: 2.5 mg via INTRAVENOUS
  Filled 2023-10-19: qty 5

## 2023-10-19 MED ORDER — LORAZEPAM 2 MG/ML IJ SOLN
0.5000 mg | INTRAMUSCULAR | Status: DC | PRN
  Administered 2023-10-19 – 2023-10-22 (×4): 0.5 mg via INTRAVENOUS
  Filled 2023-10-19 (×4): qty 1

## 2023-10-19 MED ORDER — FUROSEMIDE 10 MG/ML IJ SOLN
40.0000 mg | Freq: Once | INTRAMUSCULAR | Status: AC
  Administered 2023-10-19: 40 mg via INTRAVENOUS
  Filled 2023-10-19: qty 4

## 2023-10-19 MED ORDER — FUROSEMIDE 40 MG PO TABS
40.0000 mg | ORAL_TABLET | Freq: Every day | ORAL | Status: DC
  Filled 2023-10-19: qty 1

## 2023-10-19 MED ORDER — VANCOMYCIN HCL IN DEXTROSE 1-5 GM/200ML-% IV SOLN
1000.0000 mg | INTRAVENOUS | Status: DC
  Administered 2023-10-20: 1000 mg via INTRAVENOUS
  Filled 2023-10-19 (×2): qty 200

## 2023-10-19 NOTE — Progress Notes (Signed)
 Pt placed on non rebreather, B Morrison in to see pt. See orderso

## 2023-10-19 NOTE — Progress Notes (Signed)
 Progress Note   Patient: Elizabeth [name] FMW:[id number] DOB: 23-Jul-1950 DOA: 10/18/2023     1 DOS: the patient was seen and examined on 10/19/2023   Brief hospital course: 73yo with h/o afib, DM, COPD, LLL lung cancer, OSA, class 1 obesity, HTN, HLD, and anemia who presented on 7/18 with SOB.  She was treated with oral doxycyline and Augmentin  plus prednisone  as an outpatient without improvement.  On presentation, she was in respiratory distress and started on BIPAP.  CXR with BLL PNA.  pH 7.19, CO2 19, lactate 7.9 -> 3.1, WBC 22.7.  She was diagnosed with severe sepsis due to PNA and started on Ceftriaxone  and Azithromycin ; she was transitioned to HFNC O2 for acute hypoxic respiratory failure (sats as low as 81%).  CTA negative for PE, small R effusion with interstitial edema, R-sided GGO and airspace densities.  Echo ordered.  Assessment and Plan:  Acute hypoxic respiratory failure  Patient with prior diagnosis of ongoing sinusitis, treated with abx and steroids without improvement She developed acute worsening on day of admission with respiratory distress O2 sats reported in the 60s on presentation She was placed on BIPAP -> HFNC O2 and has transitioned to [place] O2 Thought to be combination of severe sepsis due to PNA with concomitant CHF exacerbation (see below) She is a smoker but there is low current concern for COPD component to presentation Admitted to progressive care  Severe sepsis due to CAP Tachycardia, tachypnea, leukocytosis, and hypoxia on presentation  CTA with increased streaky bilateral lower lobe opacity consistent with pneumonia Sepsis physiology has resolved and lactic acidosis improved after IVF/abx Continue abx but broaden to Cefepime  and Vancomycin  for now given outpatient treatment failure Blood cultures are NTD  Acute heart failure No prior reported h/o CHF, no echo seen in the system but was ordered by Dr. Florencio at clinic visit on 7/15 CTA was suggestive of  pulmonary edema Mildly elevated BNP without prior baseline Mildly elevated troponin, will recheck to ensure no delta Echo is pending Improved with Lasix  IV x 1 Resume home Lasix  on 7/20   Type 2 diabetes mellitus  Diet controlled A1c 6.7, good control Cover with moderate-scale SSI Carb modified diet   HTN Hold losartan  (mildly increased creatinine but not c/w AKI)   OSA on CPAP Continue CPAP  COPD  No current evidence of exacerbation, minimal wheezing on today's exam Continue Ensifentrine , Symbicort (Breo per formulary) Albuterol  prn, standing Duonebs (in lieu of Spiriva ) Will stop steroids given low suspicion for acute exacerbation  Afib Start Eliquis  (no evidence of PE on imaging)   Mixed hyperlipidemia Continue rosuvastatin     Weakness generalized Due to presenting issues PT/OT consulted STR may be needed  Tobacco use disorder Cessation encouraged Nicotine  patch ordered   Mood d/o Continue Abilify , Effexor  Ativan  prn anxiety, as there appears to be a substantial anxiety component to her dyspnea at this time  Class 1 obesity Body mass index is 33.75 kg/m.SABRA  Weight loss should be encouraged She was recently prescribed oral semaglutide  but has not yet started this Outpatient PCP/bariatric medicine f/u encouraged Significantly low or high BMI is associated with higher medical risk including morbidity and mortality     Consultants: PT OT  Procedures: None  Antibiotics: Ceftriaxone  x 1 Cefepime  7/19- Vancomycin  7/19-   30 Day Unplanned Readmission Risk Score    Flowsheet Row ED to Hosp-Admission (Current) from 10/18/2023 in [facility]  30 Day Unplanned Readmission Risk Score (%) 31.04 Filed at  10/19/2023 0400    This score is the patient's risk of an unplanned readmission within 30 days of being discharged (0 -100%). The score is based on dignosis, age, lab data, medications, orders, and past utilization.    Low:  0-14.9   Medium: 15-21.9   High: 22-29.9   Extreme: 30 and above           Subjective: Somewhat improved this AM.  She had a rough night with ongoing SOB and anxiety.  Transferred to progressive care.  Has been able to transition to [place] O2.  Daughter reported that she thought she was having a stroke, wasn't responding well, staring straight ahead.   Objective: Vitals:   10/19/23 1622 10/19/23 1644  BP: (!) 119/46   Pulse: 96   Resp:    Temp: 97.7 F (36.5 C)   SpO2: 96% 96%    Intake/Output Summary (Last 24 hours) at 10/19/2023 1654 Last data filed at 10/19/2023 1500 Gross per 24 hour  Intake 274.75 ml  Output --  Net 274.75 ml   Filed Weights   10/18/23 0558  Weight: 83.7 kg    Exam:  General:  Appears calm and comfortable and is in NAD, on 4L  O2 Eyes:   normal lids, iris ENT:  grossly normal hearing, lips & tongue, mmm Cardiovascular:  Irregularly irregular rate with mild tachycardia. No LE edema.  Respiratory:   RLL rhonchi.  Normal respiratory effort. Abdomen:  soft, NT, ND Skin:  no rash or induration seen on limited exam Musculoskeletal:  grossly normal tone BUE/BLE, good ROM, no bony abnormality Psychiatric:  grossly normal mood and affect, speech fluent and appropriate, AOx3 Neurologic:  CN 2-12 grossly intact, moves all extremities in coordinated fashion   Data Reviewed: I have reviewed the patient's lab results since admission.  Pertinent labs for today include:   ABG: 7.31/40/205/20.1/99.6% on NRB at 0453; improved from 7.19/49/18.7 on 7/18 at 0630 CO2 20 Glucose 153 BUN 25/Creatinine 1.10/GFR 53 Albumin 2.7 WBC 25.5 Hgb 10.8 A1c 6.7 BNP 329.7 HS troponin 29    Family Communication: None present; I spoke with her daughter by telephone   Disposition: Status is: Inpatient Remains inpatient appropriate because: ongoing management     Time spent: 50 minutes  Unresulted Labs (From admission, onward)     Start     Ordered    10/20/23 0500  CBC with Differential/Platelet  Tomorrow morning,   R        10/19/23 1629   10/20/23 0500  Basic metabolic panel with GFR  Tomorrow morning,   R        10/19/23 1629   10/19/23 1629  Lactic acid, plasma  (Lactic Acid)  Once,   R        10/19/23 1629   10/19/23 1629  Procalcitonin  Once,   R       References:    Procalcitonin Lower Respiratory Tract Infection AND Sepsis Procalcitonin Algorithm   10/19/23 1629   10/19/23 0755  MRSA Next Gen by PCR, Nasal  (MRSA Screening)  ONCE - URGENT,   TIMED        10/19/23 0754   10/19/23 0542  CBC with Differential/Platelet  Add-on,   AD        10/19/23 0542   10/18/23 0735  Expectorated Sputum Assessment w Gram Stain, Rflx to Resp Cult  (COPD / Pneumonia / Cellulitis / Lower Extremity Wound (Diabetic Foot Infection))  Once,   R  10/18/23 0748   10/18/23 0735  Legionella Pneumophila Serogp 1 Ur Ag  (COPD / Pneumonia / Cellulitis / Lower Extremity Wound (Diabetic Foot Infection))  Once,   R        10/18/23 9251             Author: Delon Herald, MD 10/19/2023 4:54 PM  For on call review [email or web address].

## 2023-10-19 NOTE — Progress Notes (Signed)
 Pt anxious, SOB, can't catch breath, wheezing. B Morrison notified, in to see patient, orders given

## 2023-10-19 NOTE — Progress Notes (Signed)
 Pt c/o SOB,, can't catch her breath, states I am going to die. Resp in with patient. B Morrison notified.

## 2023-10-19 NOTE — Significant Event (Addendum)
       CROSS COVER NOTE  NAME: Elizabeth [name] MRN: [id number] DOB : [birth year] ATTENDING PHYSICIAN: Soledad Ligas, DO    Date of Service   10/19/2023   HPI/Events of Note   Called to bedside, patient with acute resp distress. Similar episode earlier in shift was abated with dose of morphine  ad start of cpap  HPI:  Elizabeth [name] admitted with Sepsis as patient with leukocytosis, tachycardia and hypoxic on admission. However, leukocytosis may be secondary to outpatient steroids given. Patient stated she completed 3 days of the taper, was taking augmentin  and doxy prior to being brought to hospital. Patient with new onset a fib at beginning of month. Echo not yet performed, and eliquis  never started. BNP 329.7 initially on admit to ER. Troponin 29. EKG a fib with RVR Patient initially given fluids with elevated lactic acid 7.9 improved with IV fluids.  Interventions   Assessment/Plan:    10/19/2023    3:46 AM 10/18/2023   11:23 PM 10/18/2023    9:21 PM  Vitals with BMI  Systolic 129 105 876  Diastolic 73 54 74  Pulse 105 94 116    Temp                      98.4 Bedside patient with ST 147 on tel - ? Inverted T waves lead and prolonged QT Rapid shallow breathing exp wheeze mostly on right. Sats droped as low as 81% when patient initially taken off cpap and put on 6L [place]. Sats improved with use of NRB ABG shows pH 7.31 PCO2 40, PO2 205, HCO 3 20.1 Ddimer 1.94 EKG - pending Cbc - pending  Bmp  Latest Reference Range & Units 10/19/23 04:43  Sodium 135 - 145 mmol/L 141  Potassium 3.5 - 5.1 mmol/L 4.7  Chloride 98 - 111 mmol/L 109  CO2 22 - 32 mmol/L 20 (L)  Glucose 70 - 99 mg/dL 846 (H)  BUN 8 - 23 mg/dL 25 (H)  Creatinine 9.55 - 1.00 mg/dL 8.89 (H)  Calcium  8.9 - 10.3 mg/dL 8.2 (L)  Anion gap 5 - 15  12  Magnesium  1.7 - 2.4 mg/dL 2.7 (H)  GFR, Estimated >60 mL/min 53 (L)  (L): Data is abnormally low (H): Data is abnormally high   Metroprolol 2.5 mg IV x 1 HR improved around 100   CTA chest r.o PE + further differentiate pulmonary problem - received only one dose of lasix  last night (first initial dose, so may need IV heaprin if + PE - resulted 0646 no PE, + right pleural effusion and interstitial edema, peripheral ground glass appearance right upper and right lower lobe Echo complete ordered - see dr florencio outpatient Continue titrate oxygen and cpap prn Rocephin  and azithromycin  not ordered on admission - defer to attending for coverage preference. Findings to me favor CHF superimposed on emphysema - may benefit from pulm consult       Elizabeth [name] [facility] NP [facility] Cross Cover 7pm-7am - check amion for availability Pager [phone]

## 2023-10-19 NOTE — Hospital Course (Signed)
 73yo with h/o afib, DM, COPD, LLL lung cancer, OSA, class 1 obesity, HTN, HLD, and anemia who presented on 7/18 with SOB.  She was treated with oral doxycyline and Augmentin  plus prednisone  as an outpatient without improvement.  On presentation, she was in respiratory distress and started on BIPAP.  CXR with BLL PNA.  pH 7.19, CO2 19, lactate 7.9 -> 3.1, WBC 22.7.  She was diagnosed with severe sepsis due to PNA and started on Ceftriaxone  and Azithromycin ; she was transitioned to HFNC O2 for acute hypoxic respiratory failure (sats as low as 81%).  CTA negative for PE, small R effusion with interstitial edema, R-sided GGO and airspace densities.  Echo unremarkable.

## 2023-10-19 NOTE — Progress Notes (Signed)
 Pt SOB, anxious, resp notified, CPAP removed replaced on NCL, still states can't breath, states I am going to die, B Jesus notified, In to see Patient, resp in with patient, meds given as ordered

## 2023-10-19 NOTE — Progress Notes (Signed)
 Patient tx via bed with o2 by RN and Tech from 221 to 244. Pt is alert and oriented x4 but forgetful, states she has been feeling foggy headed. Pt denies other complaints. Provided pt with call bell, phone, and water at bedside. Bed locked and low, pt oriented to unit. Pt placed on tele box 19 and tele notified of move.

## 2023-10-19 NOTE — Consult Note (Signed)
 Pharmacy Antibiotic Note  Elizabeth [name] is a 73 y.o. female with PMH of new diagnosis of atrial fibrillation, DM II, COPD, LLL Ca, Morbid obesity, OSA, Pernicious, normocytic, and iron deficiency anemia. Congenital factor XI deficiency, Dyspnea, hypertension, hyperlipidemia, anxiety, tobacco abuse  admitted on 10/18/2023 with respiratory failure / pneumonia.  Pharmacy has been consulted for vancomycin  and cefepime  dosing.  Plan:  Cefepime  2 g IV q12h  Vancomycin  2 g IV followed by 1 g IV q24h --Calculated AUC: 490, Cmin: 13.7 --Daily Scr per protocol, levels at Css or as clinically indicated  Height: 5' 2 (157.5 cm) Weight: 83.7 kg (184 lb 8.4 oz) IBW/kg (Calculated) : 50.1  Temp (24hrs), Avg:98.1 F (36.7 C), Min:97.5 F (36.4 C), Max:98.5 F (36.9 C)  Recent Labs  Lab 10/18/23 0600 10/18/23 0819 10/19/23 0443 10/19/23 0626  WBC 22.7*  --   --  25.5*  CREATININE 1.03*  --  1.10*  --   LATICACIDVEN 7.9* 3.1*  --   --     Estimated Creatinine Clearance: 45 mL/min (A) (by C-G formula based on SCr of 1.1 mg/dL (H)).    Allergies  Allergen Reactions   Moxifloxacin Itching, Other (See Comments), Photosensitivity, Rash and Swelling    Causes swelling, redness, burning in eyes    Levofloxacin Other (See Comments)    Other reaction(s): Muscle Pain  Other reaction(s): Muscle Pain Other reaction(s): Muscle Pain   Amoxicillin -Pot Clavulanate Nausea Only and Other (See Comments)    Other Reaction: DIARRHEA.  Other reaction(s): Other (See Comments)  Other Reaction: DIARRHEA. Other reaction(s): Other (See Comments) Other Reaction: DIARRHEA. Other Reaction: DIARRHEA.  Other reaction(s): Other (See Comments) Other Reaction: DIARRHEA.    Other Reaction: DIARRHEA. Other Reaction: DIARRHEA.  Other reaction(s): Other (See Comments) Other Reaction: DIARRHEA.    Antimicrobials this admission: Ceftriaxone  7/19 x 1 Cefepime  7/19 >>  Vancomycin  7/19 >>   Dose adjustments this  admission: N/A  Microbiology results: 7/18 BCx: NGTD 7/19 Sputum: pending  7/19 MRSA PCR: pending  Thank you for allowing pharmacy to be a part of this patient's care.  Marolyn KATHEE Mare 10/19/2023 7:58 AM

## 2023-10-19 NOTE — Care Management Important Message (Signed)
 Important Message  Patient Details  Name: Elizabeth [name] MRN: [id number] Date of Birth: [birth year]   Important Message Given:  Yes - Medicare IM     Rojelio SHAUNNA Rattler 10/19/2023, 2:13 PM

## 2023-10-19 NOTE — Progress Notes (Signed)
*  PRELIMINARY RESULTS* Echocardiogram 2D Echocardiogram has been performed.  Elizabeth [name] Dwaine Pringle 10/19/2023, 4:06 PM    Attempted using Definity on patient but IV was not good. I didn't see the contrast when I pushed the Definity through IV.

## 2023-10-20 DIAGNOSIS — J9601 Acute respiratory failure with hypoxia: Secondary | ICD-10-CM | POA: Diagnosis not present

## 2023-10-20 LAB — CBC WITH DIFFERENTIAL/PLATELET
Abs Immature Granulocytes: 0.1 K/uL — ABNORMAL HIGH (ref 0.00–0.07)
Basophils Absolute: 0 K/uL (ref 0.0–0.1)
Basophils Relative: 0 %
Eosinophils Absolute: 0 K/uL (ref 0.0–0.5)
Eosinophils Relative: 0 %
HCT: 32.9 % — ABNORMAL LOW (ref 36.0–46.0)
Hemoglobin: 10.7 g/dL — ABNORMAL LOW (ref 12.0–15.0)
Immature Granulocytes: 1 %
Lymphocytes Relative: 10 %
Lymphs Abs: 1.5 K/uL (ref 0.7–4.0)
MCH: 27.2 pg (ref 26.0–34.0)
MCHC: 32.5 g/dL (ref 30.0–36.0)
MCV: 83.7 fL (ref 80.0–100.0)
Monocytes Absolute: 0.9 K/uL (ref 0.1–1.0)
Monocytes Relative: 6 %
Neutro Abs: 12.8 K/uL — ABNORMAL HIGH (ref 1.7–7.7)
Neutrophils Relative %: 83 %
Platelets: 249 K/uL (ref 150–400)
RBC: 3.93 MIL/uL (ref 3.87–5.11)
RDW: 14.7 % (ref 11.5–15.5)
WBC: 15.4 K/uL — ABNORMAL HIGH (ref 4.0–10.5)
nRBC: 0 % (ref 0.0–0.2)

## 2023-10-20 LAB — BASIC METABOLIC PANEL WITH GFR
Anion gap: 11 (ref 5–15)
BUN: 33 mg/dL — ABNORMAL HIGH (ref 8–23)
CO2: 25 mmol/L (ref 22–32)
Calcium: 8.2 mg/dL — ABNORMAL LOW (ref 8.9–10.3)
Chloride: 105 mmol/L (ref 98–111)
Creatinine, Ser: 1.23 mg/dL — ABNORMAL HIGH (ref 0.44–1.00)
GFR, Estimated: 46 mL/min — ABNORMAL LOW (ref >60)
Glucose, Bld: 128 mg/dL — ABNORMAL HIGH (ref 70–99)
Potassium: 3.8 mmol/L (ref 3.5–5.1)
Sodium: 141 mmol/L (ref 135–145)

## 2023-10-20 LAB — ECHOCARDIOGRAM COMPLETE
AR max vel: 1.7 cm2
AV Area VTI: 1.94 cm2
AV Area mean vel: 1.87 cm2
AV Mean grad: 3 mmHg
AV Peak grad: 8.1 mmHg
Ao pk vel: 1.42 m/s
Area-P 1/2: 5.7 cm2
Calc EF: 52.1 %
Height: 62 in
MV VTI: 1.92 cm2
S' Lateral: 3 cm
Single Plane A2C EF: 50.2 %
Single Plane A4C EF: 55.6 %
Weight: 2952.4 [oz_av]

## 2023-10-20 LAB — GLUCOSE, CAPILLARY
Glucose-Capillary: 106 mg/dL — ABNORMAL HIGH (ref 70–99)
Glucose-Capillary: 183 mg/dL — ABNORMAL HIGH (ref 70–99)
Glucose-Capillary: 87 mg/dL (ref 70–99)
Glucose-Capillary: 114 mg/dL — ABNORMAL HIGH (ref 70–99)

## 2023-10-20 LAB — EXPECTORATED SPUTUM ASSESSMENT W GRAM STAIN, RFLX TO RESP C

## 2023-10-20 LAB — MRSA NEXT GEN BY PCR, NASAL: MRSA by PCR Next Gen: NOT DETECTED

## 2023-10-20 MED ORDER — FUROSEMIDE 10 MG/ML IJ SOLN
40.0000 mg | Freq: Once | INTRAMUSCULAR | Status: AC
  Administered 2023-10-20: 40 mg via INTRAVENOUS
  Filled 2023-10-20: qty 4

## 2023-10-20 MED ORDER — BUSPIRONE HCL 10 MG PO TABS
5.0000 mg | ORAL_TABLET | Freq: Three times a day (TID) | ORAL | Status: DC
  Administered 2023-10-20 – 2023-10-22 (×6): 5 mg via ORAL
  Filled 2023-10-20 (×6): qty 1

## 2023-10-20 MED ORDER — MORPHINE SULFATE (PF) 2 MG/ML IV SOLN
1.0000 mg | Freq: Once | INTRAVENOUS | Status: AC
  Administered 2023-10-20: 1 mg via INTRAVENOUS
  Filled 2023-10-20: qty 1

## 2023-10-20 MED ORDER — SODIUM CHLORIDE 0.9 % IV SOLN: Freq: Once | INTRAVENOUS | Status: AC

## 2023-10-20 MED ORDER — IPRATROPIUM-ALBUTEROL 0.5-2.5 (3) MG/3ML IN SOLN
3.0000 mL | Freq: Three times a day (TID) | RESPIRATORY_TRACT | Status: DC
  Administered 2023-10-20 – 2023-10-22 (×6): 3 mL via RESPIRATORY_TRACT
  Filled 2023-10-20 (×6): qty 3

## 2023-10-20 NOTE — Evaluation (Signed)
 Physical Therapy Evaluation Co-Eval with OT Patient Details Name: Elizabeth [name] MRN: [id number] DOB: 1950/10/20 Today's Date: 10/20/2023  History of Present Illness  Pt is a 73 y.o. female presents with progressive shortness of breath, also states that she feels dizzy and generalized weakness. Admitted for management of severe sepsis d/t CAP and acute hypoxic respiratory failure. PMH of new diagnosis of atrial fibrillation, DM II, COPD, LLL Ca, Morbid obesity, OSA, Pernicious, normocytic, and iron deficiency anemia. Congenital factor XI deficiency, Dyspnea, hypertension, hyperlipidemia, anxiety, tobacco abuse.  Clinical Impression  73 yo Female admitted with worsening shortness of breath. Patient was on supplemental O2 for nighttime prior to admittance; However recently she reports worsening shortness of breath with all mobility feeling like she can't get up and move around her home. She lives at home with her daughter, son-in-law and another woman who assists with her care. Patient reports her daughter recently had knee surgery and can't physically help her at this time. However the other girl that lives with her was helping her with some ADLs prior to admittance. She would help put tub bench in shower and assist with cooking/cleaning. She reports someone is at her home 24/7 to assist with ADLs. Patient reports minimal shortness of breath at rest. However vitals monitored and at rest, HR fluctuated between 125-145 bpm. Patient on 3L O2; SPO2 was >93% throughout therapy evaluation. Upon sitting edge of bed and standing, HR increased up to 180 bpm. RN notified and aware. She was agreeable to patient doing light activity as tolerated. PT/OT monitored vitals throughout and provided multiple rest breaks. Patient was able to transfer to bedside commode and bedside chair. She transfers with hand hold assist and can take a few steps with min A/hand hold assist. Patient most limited by low endurance with elevated HR  and shortness of breath. She would benefit from skilled PT intervention to improve strength and mobility while monitoring vitals for safety with mobility.       If plan is discharge home, recommend the following: A lot of help with walking and/or transfers;A lot of help with bathing/dressing/bathroom;Assistance with cooking/housework;Assist for transportation;Help with stairs or ramp for entrance   Can travel by private vehicle        Equipment Recommendations None recommended by PT  Recommendations for Other Services       Functional Status Assessment Patient has had a recent decline in their functional status and demonstrates the ability to make significant improvements in function in a reasonable and predictable amount of time.     Precautions / Restrictions Precautions Precautions: Fall Recall of Precautions/Restrictions: Intact Precaution/Restrictions Comments: reports 1 fall in last 6 months Restrictions Weight Bearing Restrictions Per Provider Order: No      Mobility  Bed Mobility Overal bed mobility: Needs Assistance Bed Mobility: Supine to Sit     Supine to sit: Min assist     General bed mobility comments: required hand hold assist to pull self up to sitting; HR monitored throughout    Transfers Overall transfer level: Needs assistance Equipment used: 1 person hand held assist Transfers: Bed to chair/wheelchair/BSC   Stand pivot transfers: Contact guard assist         General transfer comment: Pt transferred stand pivot from bed to bedside commode with hand held CGA. She had been incontinent in bed and required assistance to clean self. After changing, she required min A to transfer to stand and step to bedside chair.    Ambulation/Gait Ambulation/Gait assistance: Min assist Gait  Distance (Feet): 3 Feet Assistive device: 1 person hand held assist Gait Pattern/deviations: Step-through pattern, Wide base of support, Trunk flexed Gait velocity:  decreased     General Gait Details: pt ambulates with wide base of support, short step length but reciprocal pattern, slower gait speed, limited to short distances due to vitals.  Stairs            Wheelchair Mobility     Tilt Bed    Modified Rankin (Stroke Patients Only)       Balance Overall balance assessment: Needs assistance Sitting-balance support: Bilateral upper extremity supported, Feet supported Sitting balance-Leahy Scale: Good     Standing balance support: Single extremity supported Standing balance-Leahy Scale: Fair Standing balance comment: Required CGA to min A upon standing to steady                             Pertinent Vitals/Pain Pain Assessment Pain Assessment: No/denies pain    Home Living Family/patient expects to be discharged to:: Private residence Living Arrangements: Spouse/significant other Available Help at Discharge: Family;Available 24 hours/day (Daughter/son in Social worker and another friend, someone available 24/7) Type of Home: House Home Access: Stairs to enter Entrance Stairs-Rails: Right;Left;Can reach both Entrance Stairs-Number of Steps: 4-5   Home Layout: One level Home Equipment: Tub bench;Grab bars - tub/shower;Grab bars - toilet;Rollator (4 wheels);BSC/3in1 Additional Comments: Pt reports being on 2L O2 for nighttime;    Prior Function               Mobility Comments: Pt reports getting short winded walking through home recently; Pt reports limiting ambulation to home ambulation last several years, still drives, will go to grocery story and wait for delivery to car; Reports 1 fall in last 6 months ADLs Comments: Daughter assists with bathing and will position tub bench; daughter does the laundry; girl that lives with them helps patient with cleaning and some cooking;     Extremity/Trunk Assessment   Upper Extremity Assessment Upper Extremity Assessment: Overall WFL for tasks assessed    Lower Extremity  Assessment Lower Extremity Assessment: Overall WFL for tasks assessed       Communication   Communication Communication: No apparent difficulties    Cognition Arousal: Alert Behavior During Therapy: WFL for tasks assessed/performed                           PT - Cognition Comments: Oriented x4 Following commands: Intact       Cueing Cueing Techniques: Verbal cues     General Comments General comments (skin integrity, edema, etc.): vitals monitored throughout session, patient on 3L O2; at rest HR fluctated between 125-145 bpm, upon sitting edge of bed/standing HR increased up to 180 bpm. RN aware and was agreeable to patient doing light activity. Patient given frequent rest breaks with verbal cues for slow breathing to help reduce HR. Pt denies significant shortness of breath during activity    Exercises Other Exercises Other Exercises: Pt educated on role of PT and recommendations including positioning and  breath support to help manage vitals   Assessment/Plan    PT Assessment Patient needs continued PT services  PT Problem List Decreased strength;Cardiopulmonary status limiting activity;Decreased activity tolerance;Decreased mobility       PT Treatment Interventions DME instruction;Balance training;Gait training;Functional mobility training;Patient/family education;Therapeutic activities;Therapeutic exercise    PT Goals (Current goals can be found in the Care Plan section)  Acute Rehab PT Goals Patient Stated Goal: to get stronger PT Goal Formulation: With patient Time For Goal Achievement: 11/03/23 Potential to Achieve Goals: Good    Frequency Min 2X/week     Co-evaluation PT/OT/SLP Co-Evaluation/Treatment: Yes Reason for Co-Treatment: Complexity of the patient's impairments (multi-system involvement);Other (comment) PT goals addressed during session: Mobility/safety with mobility;Balance;Proper use of DME OT goals addressed during session: ADL's and  self-care;Proper use of Adaptive equipment and DME       AM-PAC PT 6 Clicks Mobility  Outcome Measure Help needed turning from your back to your side while in a flat bed without using bedrails?: A Little Help needed moving from lying on your back to sitting on the side of a flat bed without using bedrails?: A Little Help needed moving to and from a bed to a chair (including a wheelchair)?: A Little Help needed standing up from a chair using your arms (e.g., wheelchair or bedside chair)?: A Little Help needed to walk in hospital room?: A Lot Help needed climbing 3-5 steps with a railing? : A Lot 6 Click Score: 16    End of Session Equipment Utilized During Treatment: Gait belt Activity Tolerance: Patient tolerated treatment well Patient left: in chair;with call bell/phone within reach;with chair alarm set Nurse Communication: Mobility status;Other (comment) (RN aware of vitals) PT Visit Diagnosis: Other abnormalities of gait and mobility (R26.89);Difficulty in walking, not elsewhere classified (R26.2);Muscle weakness (generalized) (M62.81)    Time: 8975-8897 PT Time Calculation (min) (ACUTE ONLY): 38 min   Charges:   PT Evaluation $PT Eval Low Complexity: 1 Low   PT General Charges $$ ACUTE PT VISIT: 1 Visit           Brixton Schnapp PT, DPT 10/20/2023, 2:02 PM

## 2023-10-20 NOTE — Evaluation (Addendum)
 Occupational Therapy Evaluation Patient Details Name: Elizabeth [name] MRN: [id number] DOB: 06-27-1950 Today's Date: 10/20/2023   History of Present Illness   Pt is a 73 y.o. female presents with progressive shortness of breath, also states that she feels dizzy and generalized weakness. Admitted for management of severe sepsis d/t CAP and acute hypoxic respiratory failure. PMH of new diagnosis of atrial fibrillation, DM II, COPD, LLL Ca, Morbid obesity, OSA, Pernicious, normocytic, and iron deficiency anemia. Congenital factor XI deficiency, Dyspnea, hypertension, hyperlipidemia, anxiety, tobacco abuse.     Clinical Impressions Pt was seen for OT evaluation this date. PTA, pt resides in a one level home with 4-5 STE with her daughter, son in law and additional friend. Reports he is IND/MOD I with ADL performance and does not utilize a device for mobility. Her friend assists with household chores/IADLs and setting up shower chair into tub/shower. Daughter had recent surgery on her knee and will not be assisting with as much.   Pt presents to acute OT demonstrating impaired ADL performance and functional mobility 2/2 low activity tolerance and mild strength deficits. Pt with resting HR of 125-145 on entry, however bed fully soiled from purewick malfunction. Nurse okay with limited session this date. Monitored HR and sp02 throughout session. Pt currently requires Min A to reach upright sitting at EOB. She needed CGA for STS from EOB and for SPT to [facility] and to take a few steps to recliner. HR up to 180 max with little activity. Increased time to recover and edu on pacing, ECS, and PLB to prevent overexertion. Mod A provided for LB ADL management d/t poor endurance. UB ADLs to doff/don gowns with Min A to manage lines/leads. Sp02 >93% throughout session on 3L. Pt would benefit from skilled OT services to address noted impairments and functional limitations to maximize safety and independence while minimizing  falls risk and caregiver burden. Do anticipate the need for follow up OT services upon acute hospital DC.      If plan is discharge home, recommend the following:   A little help with bathing/dressing/bathroom;A little help with walking and/or transfers;Help with stairs or ramp for entrance     Functional Status Assessment   Patient has had a recent decline in their functional status and demonstrates the ability to make significant improvements in function in a reasonable and predictable amount of time.     Equipment Recommendations   None recommended by OT (has needed DME)     Recommendations for Other Services         Precautions/Restrictions   Precautions Precautions: Fall Recall of Precautions/Restrictions: Intact Precaution/Restrictions Comments: reports 1 fall in last 6 months Restrictions Weight Bearing Restrictions Per Provider Order: No     Mobility Bed Mobility Overal bed mobility: Needs Assistance Bed Mobility: Supine to Sit     Supine to sit: Min assist     General bed mobility comments: HHA to reach upright seated position    Transfers Overall transfer level: Needs assistance Equipment used: 1 person hand held assist Transfers: Bed to chair/wheelchair/BSC, Sit to/from Stand Sit to Stand: Contact guard assist Stand pivot transfers: Contact guard assist         General transfer comment: CGA for sts from EOB and SPT to [facility]; able to take a few steps to recliner from [facility] with HHA and CGA; multiple rest breaks needed d/t high HR and fatigue      Balance Overall balance assessment: Needs assistance Sitting-balance support: Bilateral upper extremity supported, Feet supported  Sitting balance-Leahy Scale: Good     Standing balance support: Single extremity supported Standing balance-Leahy Scale: Fair Standing balance comment: min/cga via HHA                           ADL either performed or assessed with clinical judgement    ADL Overall ADL's : Needs assistance/impaired             Lower Body Bathing: Moderate assistance;Sit to/from stand;Sitting/lateral leans Lower Body Bathing Details (indicate cue type and reason): assisted pt d/t high HR and poor endurance Upper Body Dressing : Set up;Sitting Upper Body Dressing Details (indicate cue type and reason): on [facility] Lower Body Dressing: Moderate assistance;Sit to/from stand;Sitting/lateral leans Lower Body Dressing Details (indicate cue type and reason): able to pull over hips in standing, assist with the rest d/t high HR and poor endurance Toilet Transfer: Contact guard assist;BSC/3in1;Stand-pivot   Toileting- Clothing Manipulation and Hygiene: Sitting/lateral lean;Sit to/from stand;Contact guard assist               Vision         Perception         Praxis         Pertinent Vitals/Pain Pain Assessment Pain Assessment: No/denies pain     Extremity/Trunk Assessment Upper Extremity Assessment Upper Extremity Assessment: Overall WFL for tasks assessed   Lower Extremity Assessment Lower Extremity Assessment: Overall WFL for tasks assessed       Communication Communication Communication: No apparent difficulties   Cognition Arousal: Alert Behavior During Therapy: WFL for tasks assessed/performed                                 Following commands: Intact       Cueing  General Comments   Cueing Techniques: Verbal cues  vitals were monitored throughout session, HR resting was between 125-145; highest reading of 180 with SPTs during session; sp02 >93% on 3L throughout; RN approved session prior to attempt   Exercises Other Exercises Other Exercises: Edu on role of OT in acute setting and DC recommendations. Provided cueing for PLB, pacing, and tasks simplification to prevent worsening dyspnea.   Shoulder Instructions      Home Living Family/patient expects to be discharged to:: Private residence Living  Arrangements: Spouse/significant other Available Help at Discharge: Family;Available 24 hours/day (Daughter/son in Social worker and another friend, someone available 24/7) Type of Home: House Home Access: Stairs to enter [employer] of Steps: 4-5 Entrance Stairs-Rails: Right;Left;Can reach both Home Layout: One level     Bathroom Shower/Tub: Chief Strategy Officer: Standard     Home Equipment: Tub bench;Grab bars - tub/shower;Grab bars - toilet;Rollator (4 wheels);BSC/3in1   Additional Comments: Pt reports being on 2L O2 for nighttime;      Prior Functioning/Environment               Mobility Comments: Pt reports getting short winded walking through home recently; Pt reports limiting ambulation to home ambulation last several years, still drives, will go to grocery story and wait for delivery to car; Reports 1 fall in last 6 months ADLs Comments: Daughter assists with bathing and will position tub bench; daughter does the laundry; girl that lives with them helps patient with cleaning and some cooking;    OT Problem List: Decreased activity tolerance   OT Treatment/Interventions: Self-care/ADL training;Therapeutic exercise;Patient/family education;Balance training;Energy conservation;Therapeutic activities;DME  and/or AE instruction      OT Goals(Current goals can be found in the care plan section)   Acute Rehab OT Goals Patient Stated Goal: improve breathing. OT Goal Formulation: With patient Time For Goal Achievement: 11/03/23 Potential to Achieve Goals: Good ADL Goals Pt Will Perform Lower Body Bathing: with supervision;sit to/from stand;sitting/lateral leans Pt Will Perform Lower Body Dressing: with supervision;with adaptive equipment;sit to/from stand;sitting/lateral leans Pt Will Transfer to Toilet: with supervision;regular height toilet;ambulating Additional ADL Goal #1: Pt will demo implementation of 1 learned ECS during ADL performance 2/2 trials to  prevent overexertion.   OT Frequency:  Min 2X/week    Co-evaluation PT/OT/SLP Co-Evaluation/Treatment: Yes Reason for Co-Treatment: Complexity of the patient's impairments (multi-system involvement);Other (comment) PT goals addressed during session: Mobility/safety with mobility;Balance;Proper use of DME OT goals addressed during session: ADL's and self-care;Proper use of Adaptive equipment and DME      AM-PAC OT 6 Clicks Daily Activity     Outcome Measure Help from another person eating meals?: None Help from another person taking care of personal grooming?: None Help from another person toileting, which includes using toliet, bedpan, or urinal?: A Little Help from another person bathing (including washing, rinsing, drying)?: A Lot Help from another person to put on and taking off regular upper body clothing?: A Little Help from another person to put on and taking off regular lower body clothing?: A Lot 6 Click Score: 18   End of Session Equipment Utilized During Treatment: Oxygen Nurse Communication: Mobility status  Activity Tolerance: Patient tolerated treatment well;Patient limited by fatigue Patient left: in chair;with call bell/phone within reach;with chair alarm set  OT Visit Diagnosis: Other abnormalities of gait and mobility (R26.89)                Time: 8975-8897 OT Time Calculation (min): 38 min Charges:  OT General Charges $OT Visit: 1 Visit OT Evaluation $OT Eval Moderate Complexity: 1 Mod OT Treatments $Self Care/Home Management : 8-22 mins Krystl Wickware, OTR/L 10/20/23, 2:11 PM  Massiah Longanecker E Keyetta Hollingworth 10/20/2023, 2:07 PM

## 2023-10-20 NOTE — Progress Notes (Signed)
 Progress Note   Patient: Elizabeth [name] FMW:[id number] DOB: 11-Mar-1951 DOA: 10/18/2023     2 DOS: the patient was seen and examined on 10/20/2023   Brief hospital course: 73yo with h/o afib, DM, COPD, LLL lung cancer, OSA, class 1 obesity, HTN, HLD, and anemia who presented on 7/18 with SOB.  She was treated with oral doxycyline and Augmentin  plus prednisone  as an outpatient without improvement.  On presentation, she was in respiratory distress and started on BIPAP.  CXR with BLL PNA.  pH 7.19, CO2 19, lactate 7.9 -> 3.1, WBC 22.7.  She was diagnosed with severe sepsis due to PNA and started on Ceftriaxone  and Azithromycin ; she was transitioned to HFNC O2 for acute hypoxic respiratory failure (sats as low as 81%).  CTA negative for PE, small R effusion with interstitial edema, R-sided GGO and airspace densities.  Echo ordered.  Assessment and Plan:  Acute hypoxic respiratory failure  Patient with prior diagnosis of ongoing sinusitis, treated with abx and steroids without improvement She developed acute worsening on day of admission with respiratory distress O2 sats reported in the 60s on presentation She was placed on BIPAP -> HFNC O2 and has transitioned to [place] O2; she required BIPAP overnight briefly overnight on 7/20 Thought to be combination of severe sepsis due to PNA with concomitant new onset atrial fibrillation and associated volume overload She is a smoker but there is low current concern for COPD component to presentation Admitted to progressive care   Severe sepsis due to CAP Tachycardia, tachypnea, leukocytosis, and hypoxia on presentation  CTA with increased streaky bilateral lower lobe opacity consistent with pneumonia Sepsis physiology has resolved and lactic acidosis improved after IVF/abx Continue Cefepime  and Vancomycin  for now given outpatient treatment failure Blood cultures are NTD   Volume overload No prior reported h/o CHF, no echo seen in the system but was ordered by  Dr. Florencio at clinic visit on 7/15 CTA was suggestive of pulmonary edema Mildly elevated BNP without prior baseline Mildly elevated troponin, uptrended but peaked and now downtrending Echo is reassuring with preserved EF, grade 1 DD, and no WMA Suspect that volume overload was related to new onset afib rather than frank CHF Improved with Lasix  IV x 1 Will hold further Lasix    Afib Start Eliquis  (no evidence of PE on imaging) Rate improved with IVF Continue Toprol  XL for rate control Elevated troponin has peaked and is downtrending, likely related to demand ischemia in the setting of afib without MI  AKI Patient with apparent volume overload on presentation She was given a dose of Lasix  with some improvement However, her baseline creatinine is 0.8 and this AM was up to 1.23 with tachycardia - likely hypovolemia now after diuresis She was given a 500 cc bolus with improvement in HR Will continue to monitor Avoid nephrotoxic medications Recheck BMP in AM   Type 2 diabetes mellitus  Diet controlled A1c 6.7, good control Cover with moderate-scale SSI Carb modified diet    HTN Hold losartan   Continue Toprol  XL   OSA on CPAP Continue CPAP   COPD  No current evidence of exacerbation, minimal wheezing on today's exam Continue Symbicort (Breo per formulary) Albuterol  prn, standing Duonebs (in lieu of Spiriva ) Will stop steroids given low suspicion for acute exacerbation Ensifentrine  is not available per formulary so will hold  Mixed hyperlipidemia Continue rosuvastatin     Weakness generalized Due to presenting issues PT/OT consulted STR may be needed   Tobacco use disorder Cessation encouraged Nicotine  patch  ordered   Mood d/o Continue Abilify , Effexor  Ativan  prn anxiety, as there appears to be a substantial anxiety component to her dyspnea at this time Will also add standing buspirone  5 mg TID as she is continuing to experience persistent anxiety and reports  significant life stressors   Class 1 obesity Body mass index is 33.75 kg/m.SABRA  Weight loss should be encouraged She was recently prescribed oral semaglutide  but has not yet started this Outpatient PCP/bariatric medicine f/u encouraged Significantly low or high BMI is associated with higher medical risk including morbidity and mortality          Consultants: PT OT   Procedures: None   Antibiotics: Ceftriaxone  x 1 Cefepime  7/19- Vancomycin  7/19-    30 Day Unplanned Readmission Risk Score    Flowsheet Row ED to Hosp-Admission (Current) from 10/18/2023 in [facility]U  30 Day Unplanned Readmission Risk Score (%) 35.61 Filed at 10/20/2023 0801    This score is the patient's risk of an unplanned readmission within 30 days of being discharged (0 -100%). The score is based on dignosis, age, lab data, medications, orders, and past utilization.   Low:  0-14.9   Medium: 15-21.9   High: 22-29.9   Extreme: 30 and above           Subjective: Reports another episode of SOB overnight, also with persistent anxiety.   Objective: Vitals:   10/20/23 1331 10/20/23 1602  BP: 124/70   Pulse: 67 92  Resp: 17   Temp: 98.8 F (37.1 C)   SpO2: 99% 98%    Intake/Output Summary (Last 24 hours) at 10/20/2023 1616 Last data filed at 10/20/2023 0900 Gross per 24 hour  Intake 240 ml  Output --  Net 240 ml   Filed Weights   10/18/23 0558  Weight: 83.7 kg    Exam:  General:  Appears calm and comfortable and is in NAD, on 3L [place] O2 Eyes:   normal lids, iris ENT:  grossly normal hearing, lips & tongue, mmm Cardiovascular:  Irregularly irregular rate with mild tachycardia. No LE edema.  Respiratory:   RLL rhonchi.  Normal respiratory effort. Abdomen:  soft, NT, ND Skin:  no rash or induration seen on limited exam Musculoskeletal:  grossly normal tone BUE/BLE, good ROM, no bony abnormality Psychiatric:  grossly normal mood and affect, speech fluent and  appropriate, AOx3 Neurologic:  CN 2-12 grossly intact, moves all extremities in coordinated fashion  Data Reviewed: I have reviewed the patient's lab results since admission.  Pertinent labs for today include:   Glucose 128 BUN 33/Creatinine 1.23/GFR 46 HS troponin 29, 332, 306, 288 WBC 15.4, down from 25.5 Hgb 10.7, stable     Family Communication: None present  Disposition: Status is: Inpatient Remains inpatient appropriate because: ongoing monitoring     Time spent: 50 minutes  Unresulted Labs (From admission, onward)     Start     Ordered   10/21/23 0500  CBC with Differential/Platelet  Tomorrow morning,   R       Question:  Specimen collection method  Answer:  Lab=Lab collect   10/20/23 1616   10/21/23 0500  Basic metabolic panel with GFR  Tomorrow morning,   R       Question:  Specimen collection method  Answer:  Lab=Lab collect   10/20/23 1616   10/20/23 1601  Glucose, capillary  Once,   R        10/20/23 1601   10/19/23 0542  CBC  with Differential/Platelet  Add-on,   AD        10/19/23 0542   10/18/23 0735  Expectorated Sputum Assessment w Gram Stain, Rflx to Resp Cult  (COPD / Pneumonia / Cellulitis / Lower Extremity Wound (Diabetic Foot Infection))  Once,   R        10/18/23 9251             Author: Delon Herald, MD 10/20/2023 4:16 PM  For on call review [email or web address].

## 2023-10-21 DIAGNOSIS — J9601 Acute respiratory failure with hypoxia: Secondary | ICD-10-CM | POA: Diagnosis not present

## 2023-10-21 LAB — BASIC METABOLIC PANEL WITH GFR
Anion gap: 12 (ref 5–15)
BUN: 26 mg/dL — ABNORMAL HIGH (ref 8–23)
CO2: 26 mmol/L (ref 22–32)
Calcium: 8.3 mg/dL — ABNORMAL LOW (ref 8.9–10.3)
Chloride: 101 mmol/L (ref 98–111)
Creatinine, Ser: 1.03 mg/dL — ABNORMAL HIGH (ref 0.44–1.00)
GFR, Estimated: 57 mL/min — ABNORMAL LOW (ref >60)
Glucose, Bld: 76 mg/dL (ref 70–99)
Potassium: 3.5 mmol/L (ref 3.5–5.1)
Sodium: 139 mmol/L (ref 135–145)

## 2023-10-21 LAB — CBC WITH DIFFERENTIAL/PLATELET
Abs Immature Granulocytes: 0.05 K/uL (ref 0.00–0.07)
Basophils Absolute: 0 K/uL (ref 0.0–0.1)
Basophils Relative: 0 %
Eosinophils Absolute: 0 K/uL (ref 0.0–0.5)
Eosinophils Relative: 0 %
HCT: 32.2 % — ABNORMAL LOW (ref 36.0–46.0)
Hemoglobin: 10.1 g/dL — ABNORMAL LOW (ref 12.0–15.0)
Immature Granulocytes: 1 %
Lymphocytes Relative: 26 %
Lymphs Abs: 2.7 K/uL (ref 0.7–4.0)
MCH: 26.9 pg (ref 26.0–34.0)
MCHC: 31.4 g/dL (ref 30.0–36.0)
MCV: 85.6 fL (ref 80.0–100.0)
Monocytes Absolute: 0.9 K/uL (ref 0.1–1.0)
Monocytes Relative: 8 %
Neutro Abs: 6.8 K/uL (ref 1.7–7.7)
Neutrophils Relative %: 65 %
Platelets: 212 K/uL (ref 150–400)
RBC: 3.76 MIL/uL — ABNORMAL LOW (ref 3.87–5.11)
RDW: 14.6 % (ref 11.5–15.5)
WBC: 10.5 K/uL (ref 4.0–10.5)
nRBC: 0 % (ref 0.0–0.2)

## 2023-10-21 LAB — GLUCOSE, CAPILLARY
Glucose-Capillary: 89 mg/dL (ref 70–99)
Glucose-Capillary: 104 mg/dL — ABNORMAL HIGH (ref 70–99)
Glucose-Capillary: 163 mg/dL — ABNORMAL HIGH (ref 70–99)
Glucose-Capillary: 148 mg/dL — ABNORMAL HIGH (ref 70–99)

## 2023-10-21 MED ORDER — PHENOL 1.4 % MT LIQD
1.0000 | OROMUCOSAL | Status: DC | PRN
  Administered 2023-10-21: 1 via OROMUCOSAL
  Filled 2023-10-21 (×2): qty 177

## 2023-10-21 MED ORDER — SACCHAROMYCES BOULARDII 250 MG PO CAPS
250.0000 mg | ORAL_CAPSULE | Freq: Two times a day (BID) | ORAL | Status: DC
  Administered 2023-10-21 – 2023-10-22 (×3): 250 mg via ORAL
  Filled 2023-10-21 (×3): qty 1

## 2023-10-21 MED ORDER — FLUCONAZOLE 100 MG PO TABS
100.0000 mg | ORAL_TABLET | Freq: Once | ORAL | Status: AC
  Administered 2023-10-21: 100 mg via ORAL
  Filled 2023-10-21: qty 1

## 2023-10-21 NOTE — Progress Notes (Addendum)
 Progress Note   Patient: Elizabeth [name] FMW:[id number] DOB: April 23, 1950 DOA: 10/18/2023     3 DOS: the patient was seen and examined on 10/21/2023   Brief hospital course: 73yo with h/o afib, DM, COPD, LLL lung cancer, OSA, class 1 obesity, HTN, HLD, and anemia who presented on 7/18 with SOB.  She was treated with oral doxycyline and Augmentin  plus prednisone  as an outpatient without improvement.  On presentation, she was in respiratory distress and started on BIPAP.  CXR with BLL PNA.  pH 7.19, CO2 19, lactate 7.9 -> 3.1, WBC 22.7.  She was diagnosed with severe sepsis due to PNA and started on Ceftriaxone  and Azithromycin ; she was transitioned to HFNC O2 for acute hypoxic respiratory failure (sats as low as 81%).  CTA negative for PE, small R effusion with interstitial edema, R-sided GGO and airspace densities.  Echo unremarkable.  Assessment and Plan:  Acute hypoxic respiratory failure  Patient with prior diagnosis of ongoing sinusitis, treated with abx and steroids without improvement She developed acute worsening on day of admission with respiratory distress O2 sats reported in the 60s on presentation She was placed on BIPAP -> HFNC O2 and has transitioned to [place] O2; she required BIPAP overnight briefly overnight on 7/20 Thought to be combination of severe sepsis due to PNA with concomitant new onset atrial fibrillation and associated volume overload She is a smoker but there is low current concern for COPD component to presentation Admitted to progressive care She has been successfully weaned to room air as of 7/21 If she continues to progress well, she may be appropriate for dc to home on 7/22   Severe sepsis due to CAP Tachycardia, tachypnea, leukocytosis, and hypoxia on presentation  CTA with increased streaky bilateral lower lobe opacity consistent with pneumonia Sepsis physiology has resolved and lactic acidosis improved after IVF/abx Continue Cefepime  for now given outpatient  treatment failure; Vanc stopped with negative MRSA PCR Blood cultures are NTD   Volume overload No prior reported h/o CHF, no echo seen in the system but was ordered by Dr. Florencio at clinic visit on 7/15 CTA was suggestive of pulmonary edema Mildly elevated BNP without prior baseline Mildly elevated troponin, uptrended but peaked and now downtrending Echo is reassuring with preserved EF, grade 1 DD, and no WMA Suspect that volume overload was related to new onset afib rather than frank CHF Improved with Lasix  IV x 1 Will hold further Lasix    Afib Recent diagnosis of afib - uncertain if this is PAF or chronic as of now Start Eliquis  (no evidence of PE on imaging) Rate improved with IVF Continue Toprol  XL for rate control Elevated troponin has peaked and is downtrending, likely related to demand ischemia in the setting of afib without MI   AKI Patient with apparent volume overload on presentation She was given a dose of Lasix  with some improvement However, her baseline creatinine is 0.8 and this AM was up to 1.23 with tachycardia - likely hypovolemia now after diuresis She was given a 500 cc bolus with improvement in HR Renal function continues to improve Avoid nephrotoxic medications Recheck BMP in AM  Urinary discomfort Patient reported concern for UTI with strong smell, frequency, and urgency She has been on antibiotics and so is unlikely to have a UTI Will add fluconazole  100 mg PO x 1 and continue to follow Will also add probiotics   Type 2 diabetes mellitus  Diet controlled A1c 6.7, good control Cover with moderate-scale SSI Carb modified diet  HTN Hold losartan   Continue Toprol  XL   OSA on CPAP Continue CPAP   COPD  No current evidence of exacerbation, minimal wheezing on today's exam Continue Symbicort (Breo per formulary) Albuterol  prn, standing Duonebs (in lieu of Spiriva ) Will stop steroids given low suspicion for acute exacerbation Ensifentrine  is  not available per formulary so will hold   Mixed hyperlipidemia Continue rosuvastatin     Weakness generalized Due to presenting issues PT/OT consulted, recommending HH PT/OT   Tobacco use disorder Cessation encouraged Nicotine  patch ordered   Mood d/o Continue Abilify , Effexor  Ativan  prn anxiety, as there appears to be a substantial anxiety component to her dyspnea at this time Added standing buspirone  5 mg TID as she is continuing to experience persistent anxiety and reports significant life stressors; she reports some improvement with this medication   Class 1 obesity Body mass index is 33.75 kg/m.SABRA  Weight loss should be encouraged She was recently prescribed oral semaglutide  but has not yet started this Outpatient PCP/bariatric medicine f/u encouraged Significantly low or high BMI is associated with higher medical risk including morbidity and mortality            Consultants: PT OT   Procedures: None   Antibiotics: Ceftriaxone  x 1 Cefepime  7/19- Vancomycin  7/19-20  30 Day Unplanned Readmission Risk Score    Flowsheet Row ED to Hosp-Admission (Current) from 10/18/2023 in [facility] PCU  30 Day Unplanned Readmission Risk Score (%) 33.56 Filed at 10/21/2023 0801    This score is the patient's risk of an unplanned readmission within 30 days of being discharged (0 -100%). The score is based on dignosis, age, lab data, medications, orders, and past utilization.   Low:  0-14.9   Medium: 15-21.9   High: 22-29.9   Extreme: 30 and above           Subjective: Continues to feel better.  Anxiety is some better with buspirone .  Breathing continues to improve.   Objective: Vitals:   10/21/23 1230 10/21/23 1258  BP: (!) 153/62   Pulse: 88   Resp:    Temp: 98.3 F (36.8 C)   SpO2: 96% 97%    Intake/Output Summary (Last 24 hours) at 10/21/2023 1417 Last data filed at 10/21/2023 0947 Gross per 24 hour  Intake 997 ml  Output --  Net 997 ml    Filed Weights   10/18/23 0558  Weight: 83.7 kg    Exam:  General:  Appears calm and comfortable and is in NAD, on 3L [place] O2 Eyes:   normal lids, iris ENT:  grossly normal hearing, lips & tongue, mmm Cardiovascular:  Irregularly irregular rate with mild tachycardia. No LE edema.  Respiratory:   RLL rhonchi.  Normal respiratory effort. Abdomen:  soft, NT, ND Skin:  no rash or induration seen on limited exam Musculoskeletal:  grossly normal tone BUE/BLE, good ROM, no bony abnormality Psychiatric:  grossly normal mood and affect, speech fluent and appropriate, AOx3 Neurologic:  CN 2-12 grossly intact, moves all extremities in coordinated fashion  Data Reviewed: I have reviewed the patient's lab results since admission.  Pertinent labs for today include:   BUN 26/Creatinine 1.03/GFR 57, improving WBC 10.5 Hgb 10.1     Family Communication: None present; I spoke with her daughter by telephone  Disposition: Status is: Inpatient Remains inpatient appropriate because: ongoing management     Time spent: 50 minutes  Unresulted Labs (From admission, onward)     Start     Ordered  10/22/23 0500  CBC with Differential/Platelet  Tomorrow morning,   R       Question:  Specimen collection method  Answer:  Lab=Lab collect   10/21/23 1412   10/22/23 0500  Basic metabolic panel with GFR  Tomorrow morning,   R       Question:  Specimen collection method  Answer:  Lab=Lab collect   10/21/23 1412   10/19/23 0542  CBC with Differential/Platelet  Add-on,   AD        10/19/23 0542             Author: Delon Herald, MD 10/21/2023 2:17 PM  For on call review [email or web address].

## 2023-10-21 NOTE — Plan of Care (Signed)

## 2023-10-21 NOTE — Plan of Care (Signed)
  Problem: Fluid Volume: Goal: Ability to maintain a balanced intake and output will improve Outcome: Progressing   Problem: Nutritional: Goal: Maintenance of adequate nutrition will improve Outcome: Progressing   Problem: Skin Integrity: Goal: Risk for impaired skin integrity will decrease Outcome: Progressing   Problem: Activity: Goal: Risk for activity intolerance will decrease Outcome: Progressing   Problem: Pain Managment: Goal: General experience of comfort will improve and/or be controlled Outcome: Progressing   Problem: Safety: Goal: Ability to remain free from injury will improve Outcome: Progressing   Problem: Skin Integrity: Goal: Risk for impaired skin integrity will decrease Outcome: Progressing   Problem: Respiratory: Goal: Levels of oxygenation will improve Outcome: Progressing   Problem: Activity: Goal: Ability to tolerate increased activity will improve Outcome: Progressing

## 2023-10-22 ENCOUNTER — Other Ambulatory Visit: Payer: Self-pay

## 2023-10-22 ENCOUNTER — Encounter: Payer: Self-pay | Admitting: Oncology

## 2023-10-22 DIAGNOSIS — J9601 Acute respiratory failure with hypoxia: Secondary | ICD-10-CM | POA: Diagnosis not present

## 2023-10-22 LAB — CBC WITH DIFFERENTIAL/PLATELET
Abs Immature Granulocytes: 0.05 K/uL (ref 0.00–0.07)
Basophils Absolute: 0 K/uL (ref 0.0–0.1)
Basophils Relative: 0 %
Eosinophils Absolute: 0 K/uL (ref 0.0–0.5)
Eosinophils Relative: 0 %
HCT: 31.8 % — ABNORMAL LOW (ref 36.0–46.0)
Hemoglobin: 10 g/dL — ABNORMAL LOW (ref 12.0–15.0)
Immature Granulocytes: 1 %
Lymphocytes Relative: 23 %
Lymphs Abs: 2.2 K/uL (ref 0.7–4.0)
MCH: 26.7 pg (ref 26.0–34.0)
MCHC: 31.4 g/dL (ref 30.0–36.0)
MCV: 85 fL (ref 80.0–100.0)
Monocytes Absolute: 0.7 K/uL (ref 0.1–1.0)
Monocytes Relative: 7 %
Neutro Abs: 6.5 K/uL (ref 1.7–7.7)
Neutrophils Relative %: 69 %
Platelets: 210 K/uL (ref 150–400)
RBC: 3.74 MIL/uL — ABNORMAL LOW (ref 3.87–5.11)
RDW: 14.3 % (ref 11.5–15.5)
WBC: 9.5 K/uL (ref 4.0–10.5)
nRBC: 0 % (ref 0.0–0.2)

## 2023-10-22 LAB — BASIC METABOLIC PANEL WITH GFR
Anion gap: 10 (ref 5–15)
BUN: 18 mg/dL (ref 8–23)
CO2: 27 mmol/L (ref 22–32)
Calcium: 8.4 mg/dL — ABNORMAL LOW (ref 8.9–10.3)
Chloride: 103 mmol/L (ref 98–111)
Creatinine, Ser: 0.84 mg/dL (ref 0.44–1.00)
GFR, Estimated: >60 mL/min (ref >60)
Glucose, Bld: 123 mg/dL — ABNORMAL HIGH (ref 70–99)
Potassium: 3.4 mmol/L — ABNORMAL LOW (ref 3.5–5.1)
Sodium: 140 mmol/L (ref 135–145)

## 2023-10-22 LAB — GLUCOSE, CAPILLARY: Glucose-Capillary: 132 mg/dL — ABNORMAL HIGH (ref 70–99)

## 2023-10-22 MED ORDER — POTASSIUM CHLORIDE CRYS ER 20 MEQ PO TBCR
40.0000 meq | EXTENDED_RELEASE_TABLET | Freq: Once | ORAL | Status: AC
  Administered 2023-10-22: 40 meq via ORAL
  Filled 2023-10-22: qty 2

## 2023-10-22 MED ORDER — SACCHAROMYCES BOULARDII 250 MG PO CAPS
250.0000 mg | ORAL_CAPSULE | Freq: Two times a day (BID) | ORAL | 0 refills | Status: DC
  Filled 2023-10-22: qty 20, 10d supply, fill #0

## 2023-10-22 MED ORDER — BUSPIRONE HCL 5 MG PO TABS
5.0000 mg | ORAL_TABLET | Freq: Three times a day (TID) | ORAL | 0 refills | Status: DC
  Filled 2023-10-22: qty 90, 30d supply, fill #0

## 2023-10-22 MED ORDER — APIXABAN 5 MG PO TABS
5.0000 mg | ORAL_TABLET | Freq: Two times a day (BID) | ORAL | 0 refills | Status: DC
  Filled 2023-10-22: qty 60, 30d supply, fill #0

## 2023-10-22 MED ORDER — CEFADROXIL 500 MG PO CAPS
500.0000 mg | ORAL_CAPSULE | Freq: Two times a day (BID) | ORAL | 0 refills | Status: DC
  Filled 2023-10-22: qty 12, 6d supply, fill #0

## 2023-10-22 MED ORDER — NICOTINE 14 MG/24HR TD PT24
14.0000 mg | MEDICATED_PATCH | Freq: Every day | TRANSDERMAL | 0 refills | Status: DC
  Filled 2023-10-22: qty 28, 28d supply, fill #0

## 2023-10-22 MED ORDER — CEFUROXIME AXETIL 500 MG PO TABS
500.0000 mg | ORAL_TABLET | Freq: Two times a day (BID) | ORAL | 0 refills | Status: DC
  Filled 2023-10-22: qty 10, 5d supply, fill #0

## 2023-10-22 NOTE — Discharge Summary (Signed)
 Physician Discharge Summary   Patient: Elizabeth [name] MRN: [id number] DOB: 07/07/1950  Admit date:     10/18/2023  Discharge date: 10/22/23  Discharge Physician: Delon Herald   PCP: Edman Marsa PARAS, DO   Recommendations at discharge:   You are being discharged home with home physical and occupational therapy Complete antibiotics (Cefdinir twice daily for another 6 days) Take buspirone  3 times a day for anxiety Take Eliquis  twice daily every day Stop smoking; nicotine  patch provided Follow up with Dr. Edman in 1-2 weeks  Discharge Diagnoses: Principal Problem:   Acute hypoxic respiratory failure (HCC) Active Problems:   Congenital factor XI deficiency (HCC)   Mixed hyperlipidemia   Tobacco use disorder   OSA on CPAP   Type 2 diabetes mellitus with other specified complication (HCC)   Weakness generalized   Severe sepsis (HCC)   COPD with acute exacerbation (HCC)   CAP (community acquired pneumonia)    Hospital Course: 73yo with h/o afib, DM, COPD, LLL lung cancer, OSA, class 1 obesity, HTN, HLD, and anemia who presented on 7/18 with SOB.  She was treated with oral doxycyline and Augmentin  plus prednisone  as an outpatient without improvement.  On presentation, she was in respiratory distress and started on BIPAP.  CXR with BLL PNA.  pH 7.19, CO2 19, lactate 7.9 -> 3.1, WBC 22.7.  She was diagnosed with severe sepsis due to PNA and started on Ceftriaxone  and Azithromycin ; she was transitioned to HFNC O2 for acute hypoxic respiratory failure (sats as low as 81%).  CTA negative for PE, small R effusion with interstitial edema, R-sided GGO and airspace densities.  Echo unremarkable.  Assessment and Plan:  Acute hypoxic respiratory failure  Patient with prior diagnosis of ongoing sinusitis, treated with abx and steroids without improvement She developed acute worsening on day of admission with respiratory distress O2 sats reported in the 60s on presentation She was  placed on BIPAP -> HFNC O2 and has transitioned to [place] O2; she required BIPAP overnight briefly overnight on 7/20 Thought to be combination of severe sepsis due to PNA with concomitant new onset atrial fibrillation and associated volume overload She is a smoker but there is low current concern for COPD component to presentation Admitted to progressive care She has been successfully weaned to room air as of 7/21 She feels ready to dc to home on 7/22   Severe sepsis due to CAP Tachycardia, tachypnea, leukocytosis, and hypoxia on presentation  CTA with increased streaky bilateral lower lobe opacity consistent with pneumonia Sepsis physiology has resolved and lactic acidosis improved after IVF/abx Treated with Cefepime  given outpatient treatment failure; Vanc stopped with negative MRSA PCR, will complete course with Cefidinir x 10 total days Blood cultures are NTD   Volume overload No prior reported h/o CHF, no echo seen in the system but was ordered by Dr. Florencio at clinic visit on 7/15 CTA was suggestive of pulmonary edema Mildly elevated BNP without prior baseline Mildly elevated troponin, uptrended but peaked and now downtrending Echo is reassuring with preserved EF, grade 1 DD, and no WMA Suspect that volume overload was related to new onset afib rather than frank CHF Improved with Lasix  IV x 1 OK to resume home Lasix    Afib Recent diagnosis of afib - uncertain if this is PAF or chronic as of now Start Eliquis  (no evidence of PE on imaging) Rate improved with IVF Continue Toprol  XL for rate control Elevated troponin has peaked and is downtrending, likely related to demand ischemia  in the setting of afib without MI   AKI Patient with apparent volume overload on presentation She was given a dose of Lasix  with some improvement However, her baseline creatinine is 0.8 and this AM was up to 1.23 with tachycardia - likely hypovolemia now after diuresis She was given a 500 cc bolus with  improvement in HR Renal function has normalized Avoid nephrotoxic medications   Urinary discomfort Patient reported concern for UTI with strong smell, frequency, and urgency She has been on antibiotics and so is unlikely to have a UTI Added fluconazole  100 mg PO x 1  Added probiotics   Type 2 diabetes mellitus  Diet controlled A1c 6.7, good control Carb modified diet    HTN Resume losartan   Continue Toprol  XL   OSA on CPAP Continue CPAP   COPD  No current evidence of exacerbation, minimal wheezing on today's exam Continue Symbicort (Breo per formulary) Albuterol  prn, standing Duonebs (in lieu of Spiriva ) Stopped steroids given low suspicion for acute exacerbation Ensifentrine  is not available per formulary so will hold   Mixed hyperlipidemia Continue rosuvastatin     Weakness generalized Due to presenting issues PT/OT consulted, recommending HH PT/OT   Tobacco use disorder Cessation encouraged Nicotine  patch ordered   Mood d/o Continue Abilify , Effexor  Ativan  prn anxiety, as there appears to be a substantial anxiety component to her dyspnea at this time Added standing buspirone  5 mg TID as she is continuing to experience persistent anxiety and reports significant life stressors; she reports some improvement with this medication   Class 1 obesity Body mass index is 33.75 kg/m.SABRA  Weight loss should be encouraged She was recently prescribed oral semaglutide  but has not yet started this Outpatient PCP/bariatric medicine f/u encouraged Significantly low or high BMI is associated with higher medical risk including morbidity and mortality            Consultants: PT OT   Procedures: None   Antibiotics: Ceftriaxone  x 1 Cefepime  7/19-22 Vancomycin  7/19-20   Pain control - [place]  Controlled Substance Reporting System database was reviewed. and patient was instructed, not to drive, operate heavy machinery, perform activities at heights, swimming or  participation in water activities or provide baby-sitting services while on Pain, Sleep and Anxiety Medications; until their outpatient Physician has advised to do so again. Also recommended to not to take more than prescribed Pain, Sleep and Anxiety Medications.   Disposition: Home Diet recommendation:  Carb modified diet DISCHARGE MEDICATION: Allergies as of 10/22/2023       Reactions   Moxifloxacin Itching, Other (See Comments), Photosensitivity, Rash, Swelling   Causes swelling, redness, burning in eyes   Levofloxacin Other (See Comments)   Other reaction(s): Muscle Pain Other reaction(s): Muscle Pain Other reaction(s): Muscle Pain   Amoxicillin -pot Clavulanate Nausea Only, Other (See Comments)   Other Reaction: DIARRHEA. Other reaction(s): Other (See Comments) Other Reaction: DIARRHEA. Other reaction(s): Other (See Comments) Other Reaction: DIARRHEA. Other Reaction: DIARRHEA.  Other reaction(s): Other (See Comments) Other Reaction: DIARRHEA.    Other Reaction: DIARRHEA. Other Reaction: DIARRHEA.  Other reaction(s): Other (See Comments) Other Reaction: DIARRHEA.        Medication List     TAKE these medications    albuterol  108 (90 Base) MCG/ACT inhaler Commonly known as: VENTOLIN  HFA Inhale 2 puffs into the lungs every 6 (six) hours as needed for wheezing or shortness of breath.   apixaban  5 MG Tabs tablet Commonly known as: ELIQUIS  Take 1 tablet (5 mg total) by mouth 2 (two)  times daily.   ARIPiprazole  5 MG tablet Commonly known as: ABILIFY  Take 1 tablet by mouth once daily   benzonatate  100 MG capsule Commonly known as: TESSALON  Take 1 capsule (100 mg total) by mouth 3 (three) times daily as needed for cough.   busPIRone  5 MG tablet Commonly known as: BUSPAR  Take 1 tablet (5 mg total) by mouth 3 (three) times daily.   cefadroxil  500 MG capsule Commonly known as: DURICEF Take 1 capsule (500 mg total) by mouth 2 (two) times daily for 6 days.   cyanocobalamin   1000 MCG/ML injection Commonly known as: VITAMIN B12 Inject 1 mL (1,000 mcg total) into the muscle every 30 (thirty) days.   fluticasone  50 MCG/ACT nasal spray Commonly known as: FLONASE Place 2 sprays into both nostrils daily.   furosemide  40 MG tablet Commonly known as: LASIX  Take 40 mg by mouth.  Take 1 tablet (40 mg total) by mouth once daily   losartan  50 MG tablet Commonly known as: COZAAR  Take 1 tablet by mouth once daily   MAGnesium -Oxide 400 (240 Mg) MG tablet Generic drug: magnesium  oxide Take 1 tablet by mouth once daily   metoprolol  succinate 25 MG 24 hr tablet Commonly known as: TOPROL -XL Take 1 tablet (25 mg total) by mouth daily. Take with or immediately following a meal.   Multi-Vitamins Tabs Take by mouth.   nicotine  14 mg/24hr patch Commonly known as: NICODERM CQ  - dosed in mg/24 hours Place 1 patch (14 mg total) onto the skin daily. Start taking on: October 23, 2023   Ohtuvayre  3 MG/2.5ML Susp Generic drug: Ensifentrine  Take 3 mg by nebulization in the morning and at bedtime.   omeprazole  20 MG capsule Commonly known as: PRILOSEC TAKE 1 CAPSULE BY MOUTH ONCE DAILY BEFORE BREAKFAST   Pari ProNeb Max LC Sprint Misc   potassium chloride  SA 20 MEQ tablet Commonly known as: KLOR-CON  M Take 1 tablet by mouth once daily   rosuvastatin  40 MG tablet Commonly known as: CRESTOR  Take 40 mg by mouth daily.   Rybelsus  3 MG Tabs Generic drug: Semaglutide  Take 1 tablet (3 mg total) by mouth daily.   saccharomyces boulardii 250 MG capsule Commonly known as: FLORASTOR Take 1 capsule (250 mg total) by mouth 2 (two) times daily.   Spiriva  HandiHaler 18 MCG inhalation capsule Generic drug: tiotropium Place 1 capsule (18 mcg total) into inhaler and inhale daily.   Symbicort 160-4.5 MCG/ACT inhaler Generic drug: budesonide-formoterol Inhale 2 puffs into the lungs.   venlafaxine  XR 75 MG 24 hr capsule Commonly known as: EFFEXOR -XR TAKE 3 CAPSULES BY MOUTH  ONCE DAILY WITH BREAKFAST        Discharge Exam:   Subjective: Feeling much better, stable on RA, wants to go home   Objective: Vitals:   10/22/23 0827 10/22/23 0959  BP: (!) 156/62 (!) 156/62  Pulse: (!) 51 (!) 51  Resp:    Temp: 98.7 F (37.1 C)   SpO2: 99%     Intake/Output Summary (Last 24 hours) at 10/22/2023 1034 Last data filed at 10/22/2023 0509 Gross per 24 hour  Intake 1435.02 ml  Output --  Net 1435.02 ml   Filed Weights   10/18/23 0558  Weight: 83.7 kg    Exam:  General:  Appears calm and comfortable and is in NAD, on RA, up in bedside chair Eyes:   normal lids, iris ENT:  grossly normal hearing, lips & tongue, mmm Cardiovascular:  RRR. No LE edema.  Respiratory:   CTA B.  Normal respiratory effort. Abdomen:  soft, NT, ND Skin:  no rash or induration seen on limited exam Musculoskeletal:  grossly normal tone BUE/BLE, good ROM, no bony abnormality Psychiatric:  grossly normal mood and affect, speech fluent and appropriate, AOx3 Neurologic:  CN 2-12 grossly intact, moves all extremities in coordinated fashion   Data Reviewed: I have reviewed the patient's lab results since admission.  Pertinent labs for today include:   K+ 3.4, repleted Glucose 123 WBC 9.5, improving Hgb 10    Condition at discharge: improving  The results of significant diagnostics from this hospitalization (including imaging, microbiology, ancillary and laboratory) are listed below for reference.   Imaging Studies: ECHOCARDIOGRAM COMPLETE Result Date: 10/20/2023    ECHOCARDIOGRAM REPORT   Patient Name:   SAMMYE STAFF Date of Exam: 10/19/2023 Medical Rec #:  [id number]      Height:       62.0 in Accession #:    [id number]     Weight:       184.5 lb Date of Birth:  03-Feb-1951      BSA:          1.847 m Patient Age:    73 years       BP:           115/58 mmHg Patient Gender: F              HR:           102 bpm. Exam Location:  [facility] Procedure: 2D Echo, Cardiac Doppler and Color  Doppler (Both Spectral and Color            Flow Doppler were utilized during procedure). Indications:     Atrial Fibrillation I48.91  History:         Patient has no prior history of Echocardiogram examinations.                  Risk Factors:Hypertension.  Sonographer:     Bari Roar Referring Phys:  [id number] BRENDA MORRISON Diagnosing Phys: Cara JONETTA Lovelace MD IMPRESSIONS  1. TDS.  2. Left ventricular ejection fraction, by estimation, is 50 to 55%. The left ventricle has low normal function. The left ventricle has no regional wall motion abnormalities. There is mild concentric left ventricular hypertrophy. Left ventricular diastolic parameters are consistent with Grade I diastolic dysfunction (impaired relaxation).  3. Right ventricular systolic function is normal. The right ventricular size is normal.  4. The mitral valve is normal in structure. Trivial mitral valve regurgitation.  5. The aortic valve is normal in structure. Aortic valve regurgitation is trivial. Conclusion(s)/Recommendation(s): Poor windows for evaluation of left ventricular function by transthoracic echocardiography. Would recommend an alternative means of evaluation. FINDINGS  Left Ventricle: Left ventricular ejection fraction, by estimation, is 50 to 55%. The left ventricle has low normal function. The left ventricle has no regional wall motion abnormalities. Strain was performed and the global longitudinal strain is indeterminate. The left ventricular internal cavity size was normal in size. There is mild concentric left ventricular hypertrophy. Left ventricular diastolic parameters are consistent with Grade I diastolic dysfunction (impaired relaxation). Right Ventricle: The right ventricular size is normal. No increase in right ventricular wall thickness. Right ventricular systolic function is normal. Left Atrium: Left atrial size was normal in size. Right Atrium: Right atrial size was normal in size. Pericardium: There is no evidence of  pericardial effusion. Mitral Valve: The mitral valve is normal in structure. Trivial mitral valve regurgitation. MV peak gradient, 6.0 mmHg. The  mean mitral valve gradient is 3.0 mmHg. Tricuspid Valve: The tricuspid valve is normal in structure. Tricuspid valve regurgitation is trivial. Aortic Valve: The aortic valve is normal in structure. Aortic valve regurgitation is trivial. Aortic valve mean gradient measures 3.0 mmHg. Aortic valve peak gradient measures 8.1 mmHg. Aortic valve area, by VTI measures 1.94 cm. Pulmonic Valve: The pulmonic valve was normal in structure. Pulmonic valve regurgitation is not visualized. Aorta: The ascending aorta was not well visualized. IAS/Shunts: No atrial level shunt detected by color flow Doppler. Additional Comments: TDS. 3D was performed not requiring image post processing on an independent workstation and was indeterminate.  LEFT VENTRICLE PLAX 2D LVIDd:         4.20 cm     Diastology LVIDs:         3.00 cm     LV e' medial:    6.20 cm/s LV PW:         1.20 cm     LV E/e' medial:  14.0 LV IVS:        1.20 cm     LV e' lateral:   7.62 cm/s LVOT diam:     1.60 cm     LV E/e' lateral: 11.4 LV SV:         44 LV SV Index:   24 LVOT Area:     2.01 cm  LV Volumes (MOD) LV vol d, MOD A2C: 50.8 ml LV vol d, MOD A4C: 60.6 ml LV vol s, MOD A2C: 25.3 ml LV vol s, MOD A4C: 26.9 ml LV SV MOD A2C:     25.5 ml LV SV MOD A4C:     60.6 ml LV SV MOD BP:      29.2 ml RIGHT VENTRICLE RV Basal diam:  2.50 cm RV Mid diam:    2.30 cm RV S prime:     12.30 cm/s TAPSE (M-mode): 1.8 cm LEFT ATRIUM             Index        RIGHT ATRIUM          Index LA diam:        3.90 cm 2.11 cm/m   RA Area:     8.52 cm LA Vol (A2C):   37.4 ml 20.25 ml/m  RA Volume:   12.80 ml 6.93 ml/m LA Vol (A4C):   36.8 ml 19.92 ml/m LA Biplane Vol: 37.3 ml 20.19 ml/m  AORTIC VALVE                    PULMONIC VALVE AV Area (Vmax):    1.70 cm     PV Vmax:        1.33 m/s AV Area (Vmean):   1.87 cm     PV Peak grad:   7.1  mmHg AV Area (VTI):     1.94 cm     RVOT Peak grad: 5 mmHg AV Vmax:           142.00 cm/s AV Vmean:          84.400 cm/s AV VTI:            0.229 m AV Peak Grad:      8.1 mmHg AV Mean Grad:      3.0 mmHg LVOT Vmax:         120.00 cm/s LVOT Vmean:        78.300 cm/s LVOT VTI:          0.221  m LVOT/AV VTI ratio: 0.97  AORTA Ao Root diam: 2.90 cm Ao Asc diam:  3.20 cm MITRAL VALVE                TRICUSPID VALVE MV Area (PHT): 5.70 cm     TR Peak grad:   21.0 mmHg MV Area VTI:   1.92 cm     TR Vmax:        229.00 cm/s MV Peak grad:  6.0 mmHg MV Mean grad:  3.0 mmHg     SHUNTS MV Vmax:       1.22 m/s     Systemic VTI:  0.22 m MV Vmean:      81.8 cm/s    Systemic Diam: 1.60 cm MV Decel Time: 133 msec MV E velocity: 86.80 cm/s MV A velocity: 125.00 cm/s MV E/A ratio:  0.69 MV A Prime:    13.8 cm/s Cara JONETTA Lovelace MD Electronically signed by Cara JONETTA Lovelace MD Signature Date/Time: 10/20/2023/9:15:59 AM    Final    CT Angio Chest Pulmonary Embolism (PE) W or WO Contrast Result Date: 10/19/2023 CLINICAL DATA:  Pulmonary embolism suspected. High probability. Possible sepsis. Shortness of breath. History non-small cell lung cancer. EXAM: CT ANGIOGRAPHY CHEST WITH CONTRAST TECHNIQUE: Multidetector CT imaging of the chest was performed using the standard protocol during bolus administration of intravenous contrast. Multiplanar CT image reconstructions and MIPs were obtained to evaluate the vascular anatomy. RADIATION DOSE REDUCTION: This exam was performed according to the departmental dose-optimization program which includes automated exposure control, adjustment of the mA and/or kV according to patient size and/or use of iterative reconstruction technique. CONTRAST:  OMNIPAQUE  IOHEXOL  350 MG/ML SOLN COMPARISON:  04/19/2022 FINDINGS: Cardiovascular: Satisfactory opacification of the pulmonary arteries to the segmental level. No evidence of pulmonary embolism. Normal heart size. No pericardial effusion. Aortic  atherosclerosis and coronary artery calcifications Mediastinum/Nodes: No enlarged mediastinal, hilar, or axillary lymph nodes. Thyroid  gland, trachea, and esophagus demonstrate no significant findings. Lungs/Pleura: Emphysema with diffuse bronchial wall thickening. Trace right pleural effusion. Interlobular septal thickening is identified along with ground-glass attenuation. This has a lower lung zone predominance within the right upper lobe there is increased peripheral ground-glass and airspace densities, axial image 56 of the lung windows. Peripheral ground-glass attenuation with subpleural consolidation noted in the right lower lobe similar appearance of chronic subpleural scarring within the left lower lobe corresponding to post treatment changes as described on 12/21/2022. Small scattered lung nodules are again noted and appear unchanged compared with the prior exam the largest is in the lateral right upper lobe appears ground-glass in attenuation measuring 5 mm, axial image 33. Upper Abdomen: No acute abnormality.  [place]. Musculoskeletal: No chest wall abnormality. No acute or significant osseous findings. Review of the MIP images confirms the above findings. IMPRESSION: 1. No evidence for acute pulmonary embolus. 2. Small right pleural effusion and interstitial edema. Correlate for signs or symptoms of CHF 3. Increased peripheral ground-glass and airspace densities within the right upper lobe and right lower lobe. Differential considerations include multifocal pneumonia versus asymmetric alveolar edema. 4. Stable appearance of chronic subpleural scarring within the left lower lobe corresponding to post treatment changes as described on 12/21/2022. 5. Stable appearance of small scattered lung nodules. 6. Cholelithiasis. 7. Coronary artery calcifications. 8. Aortic Atherosclerosis (ICD10-I70.0) and Emphysema (ICD10-J43.9). Electronically Signed   By: Waddell Calk M.D.   On: 10/19/2023 06:07   DG Chest  Port 1 View Result Date: 10/18/2023 CLINICAL DATA:  73 year old female with  possible sepsis. Shortness of breath. EXAM: PORTABLE CHEST 1 VIEW COMPARISON:  Chest radiograph 10/09/2023 and earlier. FINDINGS: Portable AP upright view at 0641 hours. New like truck device projects over the left upper chest, might be external but etiology/significance unclear. Lower lung volumes. Accounting for this heart size and mediastinal contours remain within normal limits. No pneumothorax or pulmonary edema. Increased streaky bilateral lower lung opacity. No convincing consolidation or effusion. No acute osseous abnormality identified. Paucity of bowel gas. IMPRESSION: 1. Lower lung volumes with increased streaky bilateral lower lung opacity indeterminate for atelectasis versus infection. 2. New electronic device projecting over the left upper chest. Electronically Signed   By: VEAR Hurst M.D.   On: 10/18/2023 06:49   DG Chest 2 View Result Date: 10/09/2023 CLINICAL DATA:  Shortness of breath. EXAM: CHEST - 2 VIEW COMPARISON:  11/22/2021. FINDINGS: Redemonstration of focal irregular thickening with associated scarring/atelectasis in the lateral aspect of the left lower lung zone, without significant interval change. Bilateral lung fields are otherwise clear. No acute consolidation or lung collapse. Bilateral costophrenic angles are clear. Normal cardio-mediastinal silhouette. No acute osseous abnormalities. The soft tissues are within normal limits. IMPRESSION: *No active cardiopulmonary disease. Redemonstration of focal irregular thickening with associated scarring/atelectasis in the lateral aspect of the left lower lung zone, without significant interval change. Electronically Signed   By: Ree Molt M.D.   On: 10/09/2023 16:24    Microbiology: Results for orders placed or performed during the hospital encounter of 10/18/23  Blood Culture (routine x 2)     Status: None (Preliminary result)   Collection Time: 10/18/23   6:00 AM   Specimen: BLOOD  Result Value Ref Range Status   Specimen Description BLOOD LEFT ANTECUBITAL  Final   Special Requests   Final    BOTTLES DRAWN AEROBIC AND ANAEROBIC Blood Culture results may not be optimal due to an inadequate volume of blood received in culture bottles   Culture   Final    NO GROWTH 3 DAYS Performed at [facility], [address], [place], [place] [address]    Report Status PENDING  Incomplete  Blood Culture (routine x 2)     Status: None (Preliminary result)   Collection Time: 10/18/23  6:00 AM   Specimen: BLOOD  Result Value Ref Range Status   Specimen Description BLOOD BLOOD LEFT HAND  Final   Special Requests   Final    BOTTLES DRAWN AEROBIC AND ANAEROBIC Blood Culture adequate volume   Culture   Final    NO GROWTH 3 DAYS Performed at [facility], [address], [place], [place] [address]    Report Status PENDING  Incomplete  Resp panel by RT-PCR (RSV, Flu A&B, Covid) Anterior Nasal Swab     Status: None   Collection Time: 10/18/23  6:08 AM   Specimen: Anterior Nasal Swab  Result Value Ref Range Status   SARS Coronavirus 2 by RT PCR NEGATIVE NEGATIVE Final    Comment: (NOTE) SARS-CoV-2 target nucleic acids are NOT DETECTED.  The SARS-CoV-2 RNA is generally detectable in upper respiratory specimens during the acute phase of infection. The lowest concentration of SARS-CoV-2 viral copies this assay can detect is 138 copies/mL. A negative result does not preclude SARS-Cov-2 infection and should not be used as the sole basis for treatment or other patient management decisions. A negative result may occur with  improper specimen collection/handling, submission of specimen other than nasopharyngeal swab, presence of viral mutation(s) within the areas targeted by  this assay, and inadequate number of viral copies(<138 copies/mL). A negative result must be combined with clinical observations, patient history, and  epidemiological information. The expected result is Negative.  Fact Sheet for Patients:  BloggerCourse.com  Fact Sheet for Healthcare Providers:  SeriousBroker.it  This test is no t yet approved or cleared by the [place]  FDA and  has been authorized for detection and/or diagnosis of SARS-CoV-2 by FDA under an Emergency Use Authorization (EUA). This EUA will remain  in effect (meaning this test can be used) for the duration of the COVID-19 declaration under Section 564(b)(1) of the Act, 21 U.S.C.section 360bbb-3(b)(1), unless the authorization is terminated  or revoked sooner.       Influenza A by PCR NEGATIVE NEGATIVE Final   Influenza B by PCR NEGATIVE NEGATIVE Final    Comment: (NOTE) The Xpert Xpress SARS-CoV-2/FLU/RSV plus assay is intended as an aid in the diagnosis of influenza from Nasopharyngeal swab specimens and should not be used as a sole basis for treatment. Nasal washings and aspirates are unacceptable for Xpert Xpress SARS-CoV-2/FLU/RSV testing.  Fact Sheet for Patients: BloggerCourse.com  Fact Sheet for Healthcare Providers: SeriousBroker.it  This test is not yet approved or cleared by the [place]  FDA and has been authorized for detection and/or diagnosis of SARS-CoV-2 by FDA under an Emergency Use Authorization (EUA). This EUA will remain in effect (meaning this test can be used) for the duration of the COVID-19 declaration under Section 564(b)(1) of the Act, 21 U.S.C. section 360bbb-3(b)(1), unless the authorization is terminated or revoked.     Resp Syncytial Virus by PCR NEGATIVE NEGATIVE Final    Comment: (NOTE) Fact Sheet for Patients: BloggerCourse.com  Fact Sheet for Healthcare Providers: SeriousBroker.it  This test is not yet approved or cleared by the [place]  FDA and has been  authorized for detection and/or diagnosis of SARS-CoV-2 by FDA under an Emergency Use Authorization (EUA). This EUA will remain in effect (meaning this test can be used) for the duration of the COVID-19 declaration under Section 564(b)(1) of the Act, 21 U.S.C. section 360bbb-3(b)(1), unless the authorization is terminated or revoked.  Performed at [facility], [address] [place], [place] [address]   Expectorated Sputum Assessment w Gram Stain, Rflx to Resp Cult     Status: None   Collection Time: 10/18/23  6:00 PM   Specimen: Sputum  Result Value Ref Range Status   Specimen Description SPUTUM  Final   Special Requests  EXPSU  Final   Sputum evaluation   Final    THIS SPECIMEN IS ACCEPTABLE FOR SPUTUM CULTURE Performed at [facility], [address], [place], [place] [address]    Report Status 10/20/2023 FINAL  Final  MRSA Next Gen by PCR, Nasal     Status: None   Collection Time: 10/20/23 10:32 AM   Specimen: Nasal Mucosa; Nasal Swab  Result Value Ref Range Status   MRSA by PCR Next Gen NOT DETECTED NOT DETECTED Final    Comment: (NOTE) The GeneXpert MRSA Assay (FDA approved for NASAL specimens only), is one component of a comprehensive MRSA colonization surveillance program. It is not intended to diagnose MRSA infection nor to guide or monitor treatment for MRSA infections. Test performance is not FDA approved in patients less than 55 years old. Performed at [facility], [address] [place], [place] [address]   Culture, Respiratory w Gram Stain     Status: None (Preliminary result)   Collection Time: 10/20/23  6:00  PM   Specimen: SPU  Result Value Ref Range Status   Specimen Description   Final    SPUTUM Performed at [facility], [address], [place], [place] [address]    Special Requests   Final     EXPSU Reflexed from [id number] Performed at [facility], [address] [place], [place] [address]    Gram  Stain   Final    RARE WBC PRESENT, PREDOMINANTLY PMN RARE BUDDING YEAST SEEN Performed at [facility], [address] [place], [place] [address]    Culture PENDING  Incomplete   Report Status PENDING  Incomplete    Labs: CBC: Recent Labs  Lab 10/18/23 0600 10/19/23 0626 10/20/23 0500 10/21/23 0548 10/22/23 0536  WBC 22.7* 25.5* 15.4* 10.5 9.5  NEUTROABS 10.8* 20.8* 12.8* 6.8 6.5  HGB 12.0 10.8* 10.7* 10.1* 10.0*  HCT 38.4 33.3* 32.9* 32.2* 31.8*  MCV 87.1 84.1 83.7 85.6 85.0  PLT 379 295 249 212 210   Basic Metabolic Panel: Recent Labs  Lab 10/18/23 0600 10/18/23 0800 10/19/23 0443 10/20/23 0500 10/21/23 0548 10/22/23 0536  NA 138  --  141 141 139 140  K 4.7  --  4.7 3.8 3.5 3.4*  CL 105  --  109 105 101 103  CO2 19*  --  20* 25 26 27   GLUCOSE 238*  --  153* 128* 76 123*  BUN 17  --  25* 33* 26* 18  CREATININE 1.03*  --  1.10* 1.23* 1.03* 0.84  CALCIUM  8.6*  --  8.2* 8.2* 8.3* 8.4*  MG  --  2.9* 2.7*  --   --   --   PHOS  --  6.8*  --   --   --   --    Liver Function Tests: Recent Labs  Lab 10/18/23 0600  AST 77*  ALT 34  ALKPHOS 105  BILITOT 0.6  PROT 6.6  ALBUMIN 3.3*   CBG: Recent Labs  Lab 10/21/23 0834 10/21/23 1231 10/21/23 1701 10/21/23 2042 10/22/23 0827  GLUCAP 89 104* 163* 148* 132*    Discharge time spent: greater than 30 minutes.  Signed: Delon Herald, MD [facility] 10/22/2023

## 2023-10-22 NOTE — Progress Notes (Signed)
 Pt given dc/rx instructions. Pt advised to return to any ED as needed/worse. Pt voices understanding. Pt's son in law coming for pt. No distress noted with pt. Pt has chose to get dressed in dry clothing she came in with that smell of urine.Pt states she is ready to go home does not wantto wait for fresh clothes. Pt wheeled out

## 2023-10-22 NOTE — Progress Notes (Signed)
 Physical Therapy Treatment Patient Details Name: Elizabeth [name] MRN: [id number] DOB: Apr 03, 1950 Today's Date: 10/22/2023   History of Present Illness Pt is a 73 y.o. female presents with progressive shortness of breath, also states that she feels dizzy and generalized weakness. Admitted for management of severe sepsis d/t CAP and acute hypoxic respiratory failure. PMH of new diagnosis of atrial fibrillation, DM II, COPD, LLL Ca, Morbid obesity, OSA, Pernicious, normocytic, and iron deficiency anemia. Congenital factor XI deficiency, Dyspnea, hypertension, hyperlipidemia, anxiety, tobacco abuse.    PT Comments  Pt was seated in recliner on rm air upon arrival. She greets Chartered loss adjuster and remains cooperative and motivated throughout.  I want to go home today. Pt remained on rm air throughout session with sao2 >94%. HR is in A-fib however peaked HR 122 bpm during ambulation without AD. Pt seems to tolerate session well. She easily and safely stood and ambulated ~ 120 ft. During session, pt ambulated without AD however is encouraged to use personal rollator at home for additional safety. Overall, observed poor activity tolerance with severe SOB. Jesus been dealing with it for a long time. She seemed to tolerated session well. Acute PT DC recs remain appropriate. She will continue to benefit from skilled PT at DC to maximize her independence and safety with all ADLs.     If plan is discharge home, recommend the following: A lot of help with walking and/or transfers;A lot of help with bathing/dressing/bathroom;Assistance with cooking/housework;Assist for transportation;Help with stairs or ramp for entrance     Equipment Recommendations  None recommended by PT       Precautions / Restrictions Precautions Precautions: Fall Recall of Precautions/Restrictions: Intact Precaution/Restrictions Comments: reports 1 fall in last 6 months Restrictions Weight Bearing Restrictions Per Provider Order: No      Mobility  Bed Mobility  General bed mobility comments: pt was seated in recliner pre/post session.    Transfers Overall transfer level: Needs assistance Equipment used: None Transfers: Sit to/from Stand Sit to Stand: Supervision  General transfer comment: no physical assistance required to stand or sit. pt did not use AD throughout session however is encouraged to use personal rollator at home due to limited activity tolerance and for additional safety with all standing activity.    Ambulation/Gait Ambulation/Gait assistance: Contact guard assist Gait Distance (Feet): 120 Feet Assistive device: None Gait Pattern/deviations: Step-through pattern Gait velocity: decreased  General Gait Details: Pt tolerated ambulation ~ 120 ft without AD. Pt would benefit from use of rollator at DC to have a seat close and for additional safety. Pt does become SOB after only ~ 40 ft. severak standing rest breaks. Sao2> 94% on rm air throughout. HR in A fib however max HR during OOB activity was 122bpm    Balance Overall balance assessment: Needs assistance Sitting-balance support: Feet supported Sitting balance-Leahy Scale: Good     Standing balance support: During functional activity, No upper extremity supported Standing balance-Leahy Scale: Fair Standing balance comment: pt is encouraged to use rollator at DC for safety      Communication Communication Communication: No apparent difficulties  Cognition Arousal: Alert Behavior During Therapy: WFL for tasks assessed/performed   PT - Cognitive impairments: No apparent impairments    PT - Cognition Comments: Pt is A and O x 4. motivated and cooperative Following commands: Intact      Cueing Cueing Techniques: Verbal cues  Exercises Other Exercises Other Exercises: Pt educated on role of PT and recommendations including positioning and  breath support to help  manage vitals      PT Goals (current goals can now be found in the care  plan section) Acute Rehab PT Goals Patient Stated Goal: go home ASAP Progress towards PT goals: Progressing toward goals    Frequency    Min 2X/week       Co-evaluation   Reason for Co-Treatment: Complexity of the patient's impairments (multi-system involvement);Other (comment) PT goals addressed during session: Mobility/safety with mobility;Balance;Proper use of DME OT goals addressed during session: ADL's and self-care;Proper use of Adaptive equipment and DME      AM-PAC PT 6 Clicks Mobility   Outcome Measure  Help needed turning from your back to your side while in a flat bed without using bedrails?: A Little Help needed moving from lying on your back to sitting on the side of a flat bed without using bedrails?: A Little Help needed moving to and from a bed to a chair (including a wheelchair)?: A Little Help needed standing up from a chair using your arms (e.g., wheelchair or bedside chair)?: A Little Help needed to walk in hospital room?: A Lot Help needed climbing 3-5 steps with a railing? : A Lot 6 Click Score: 16    End of Session   Activity Tolerance: Patient tolerated treatment well Patient left: in chair;with call bell/phone within reach;with chair alarm set Nurse Communication: Mobility status PT Visit Diagnosis: Other abnormalities of gait and mobility (R26.89);Difficulty in walking, not elsewhere classified (R26.2);Muscle weakness (generalized) (M62.81)     Time: 9149-9091 PT Time Calculation (min) (ACUTE ONLY): 18 min  Charges:    $Gait Training: 8-22 mins PT General Charges $$ ACUTE PT VISIT: 1 Visit                     Rankin Essex PTA 10/22/23, 9:20 AM

## 2023-10-22 NOTE — TOC Transition Note (Signed)
 Transition of Care [facility]) - Progression Note    Patient Details  Name: Elizabeth [name] MRN: [id number] Date of Birth: 1950/05/23  Transition of Care [facility]) CM/SW Contact  Tomasa JAYSON Childes, RN Phone Number: 10/22/2023, 12:22 PM  Clinical Narrative:    Spoke with patient at bedside regarding therapy's recommendation for [facility]. Patient is agreeable to [facility] PT. She was provided choices for [facility], [facility] , and [facility].Patient stated she does not have a choice of an agency. She was advised [facility] would be contacted. Patient advised the accepting agency will contact her directly to scheduled SOC within 48 post discharge   Referral sent to and accepted by Lauraine at [facility].   TOC signing off.         Expected Discharge Plan and Services         Expected Discharge Date: 10/22/23                                     Social Determinants of Health (SDOH) Interventions SDOH Screenings   Food Insecurity: No Food Insecurity (10/19/2023)  Housing: Low Risk  (10/19/2023)  Transportation Needs: No Transportation Needs (10/19/2023)  Utilities: Not At Risk (10/19/2023)  Alcohol Screen: Low Risk  (10/11/2023)  Depression (PHQ2-9): Low Risk  (10/11/2023)  Financial Resource Strain: Low Risk  (10/11/2023)  Physical Activity: Inactive (10/11/2023)  Social Connections: Socially Isolated (10/19/2023)  Stress: No Stress Concern Present (10/11/2023)  Tobacco Use: High Risk (10/18/2023)  Health Literacy: Adequate Health Literacy (10/11/2023)    Readmission Risk Interventions     No data to display

## 2023-10-23 ENCOUNTER — Telehealth: Admitting: Physician Assistant

## 2023-10-23 ENCOUNTER — Telehealth: Payer: Self-pay

## 2023-10-23 ENCOUNTER — Other Ambulatory Visit: Payer: Self-pay | Admitting: Family Medicine

## 2023-10-23 DIAGNOSIS — F419 Anxiety disorder, unspecified: Secondary | ICD-10-CM

## 2023-10-23 DIAGNOSIS — I4891 Unspecified atrial fibrillation: Secondary | ICD-10-CM | POA: Diagnosis not present

## 2023-10-23 DIAGNOSIS — F331 Major depressive disorder, recurrent, moderate: Secondary | ICD-10-CM

## 2023-10-23 DIAGNOSIS — J189 Pneumonia, unspecified organism: Secondary | ICD-10-CM

## 2023-10-23 DIAGNOSIS — D510 Vitamin B12 deficiency anemia due to intrinsic factor deficiency: Secondary | ICD-10-CM

## 2023-10-23 LAB — CULTURE, RESPIRATORY W GRAM STAIN

## 2023-10-23 LAB — CULTURE, BLOOD (ROUTINE X 2)
Culture: NO GROWTH
Culture: NO GROWTH
Special Requests: ADEQUATE

## 2023-10-23 MED ORDER — BUSPIRONE HCL 5 MG PO TABS: 5.0000 mg | ORAL_TABLET | Freq: Three times a day (TID) | ORAL | 0 refills | Status: AC

## 2023-10-23 MED ORDER — SACCHAROMYCES BOULARDII 250 MG PO CAPS: 250.0000 mg | ORAL_CAPSULE | Freq: Two times a day (BID) | ORAL | 0 refills | Status: AC

## 2023-10-23 MED ORDER — APIXABAN 5 MG PO TABS: 5.0000 mg | ORAL_TABLET | Freq: Two times a day (BID) | ORAL | 0 refills | Status: AC

## 2023-10-23 MED ORDER — CEFUROXIME AXETIL 500 MG PO TABS: 500.0000 mg | ORAL_TABLET | Freq: Two times a day (BID) | ORAL | 0 refills | Status: DC

## 2023-10-23 NOTE — Transitions of Care (Post Inpatient/ED Visit) (Signed)
 Today's TOC FU Call Status: Today's TOC FU Call Status:: Successful TOC FU Call Completed TOC FU Call Complete Date: 10/23/23 Patient's Name and Date of Birth confirmed.  Phone call conducted with daughter and patient per patient request.   Transition Care Management Follow-up Telephone Call Date of Discharge: 10/23/23 Discharge Facility: [facility]) Type of Discharge: Inpatient Admission Primary Inpatient Discharge Diagnosis:: Acute Respiratory Failure How have you been since you were released from the hospital?: Better Any questions or concerns?: Yes Patient Questions/Concerns:: Did not receive an AVS or know where medications had been sent. Were picked up today but missed some doses yesterday. Patient Questions/Concerns Addressed: Other: (Reviewed AVS/Medications)  Items Reviewed: Did you receive and understand the discharge instructions provided?: No (Did not receive. AVS was reviewed on this call and what to call provider for. Had a televisit this am.) Medications obtained,verified, and reconciled?: Yes (Medications Reviewed) Any new allergies since your discharge?: No Dietary orders reviewed?: Yes Type of Diet Ordered:: Carb modified diet Do you have support at home?: Yes People in Home [RPT]: child(ren), adult Name of Support/Comfort Primary Source: Kenlyn, Lose (Daughter)  [phone] (Mobile)  Medications Reviewed Today: Medications Reviewed Today     Reviewed by Carolee Heron NOVAK, RN (Case Manager) on 10/23/23 at 1259  Med List Status: <None>   Medication Order Taking? Sig Documenting Provider Last Dose Status Informant  albuterol  (VENTOLIN  HFA) 108 (90 Base) MCG/ACT inhaler [id number] Yes Inhale 2 puffs into the lungs every 6 (six) hours as needed for wheezing or shortness of breath. Edman Marsa PARAS, DO  Active Child  apixaban  (ELIQUIS ) 5 MG TABS tablet [id number] Yes Take 1 tablet (5 mg total) by mouth 2 (two) times daily. Vivienne Nest M, PA-C  Active   ARIPiprazole  (ABILIFY ) 5 MG tablet [id number] Yes Take 1 tablet by mouth once daily Edman Marsa PARAS, DO  Active Child  benzonatate  (TESSALON ) 100 MG capsule [id number]  Take 1 capsule (100 mg total) by mouth 3 (three) times daily as needed for cough.  Patient not taking: Reported on 10/23/2023   Gladis Elsie BROCKS, PA-C  Active Child  busPIRone  (BUSPAR ) 5 MG tablet [id number] Yes Take 1 tablet (5 mg total) by mouth 3 (three) times daily. Vivienne Nest M, PA-C  Active   cefUROXime  (CEFTIN ) 500 MG tablet [id number] Yes Take 1 tablet (500 mg total) by mouth 2 (two) times daily with a meal for 10 days. Vivienne Nest M, PA-C  Active   cyanocobalamin  (VITAMIN B12) 1000 MCG/ML injection [id number] Yes Inject 1 mL (1,000 mcg total) into the muscle every 30 (thirty) days. Kingston Robes, PA-C  Active Child  fluticasone  (FLONASE) 50 MCG/ACT nasal spray [id number] Yes Place 2 sprays into both nostrils daily. [provider]  Active Child  furosemide  (LASIX ) 40 MG tablet [id number] Yes Take 40 mg by mouth.  Take 1 tablet (40 mg total) by mouth once daily [provider]  Active Child  losartan  (COZAAR ) 50 MG tablet [id number] Yes Take 1 tablet by mouth once daily Karamalegos, Marsa PARAS, DO  Active Child  MAGNESIUM -OXIDE 400 (240 Mg) MG tablet [id number] Yes Take 1 tablet by mouth once daily Edman Marsa PARAS, DO  Active Child  metoprolol  succinate (TOPROL -XL) 25 MG 24 hr tablet [id number] Yes Take 1 tablet (25 mg total) by mouth daily. Take with or immediately following a meal. Edman, Marsa PARAS, DO  Active Child  Multiple Vitamin (MULTI-VITAMINS) TABS [id number] Yes Take by mouth. [provider]  Active Child  Nebulizers (PARI PRONEB MAX LC SPRINT) MISC [id number] Yes  [provider]  Active Child  nicotine  (NICODERM CQ  - DOSED IN MG/24 HOURS) 14 mg/24hr patch [id number]  Place 1 patch (14 mg total) onto the skin daily.  Patient  not taking: Reported on 10/23/2023   Barbarann Nest, MD  Active   OHTUVAYRE  3 MG/2.5ML SUSP [id number] Yes Take 3 mg by nebulization in the morning and at bedtime. Edman Marsa PARAS, DO  Active Child  omeprazole  (PRILOSEC) 20 MG capsule [id number] Yes TAKE 1 CAPSULE BY MOUTH ONCE DAILY BEFORE BREAKFAST Karamalegos, Marsa PARAS, DO  Active Child  potassium chloride  SA (KLOR-CON  M) 20 MEQ tablet [id number] Yes Take 1 tablet by mouth once daily Edman Marsa PARAS, DO  Active Child  rosuvastatin  (CRESTOR ) 40 MG tablet [id number] Yes Take 40 mg by mouth daily. [provider]  Active Child  RYBELSUS  3 MG TABS [id number]  Take 1 tablet (3 mg total) by mouth daily.  Patient not taking: Reported on 10/23/2023   Edman Marsa PARAS, DO  Active Child           Med Note [facility], ELIZABETH A   Fri Oct 18, 2023  7:23 AM) NOT STARTED  saccharomyces boulardii (FLORASTOR) 250 MG capsule [id number] Yes Take 1 capsule (250 mg total) by mouth 2 (two) times daily. Vivienne Nest HERO, PA-C  Active   SPIRIVA  HANDIHALER 18 MCG inhalation capsule [id number] Yes Place 1 capsule (18 mcg total) into inhaler and inhale daily. Edman Marsa PARAS, DO  Active Child  SYMBICORT 160-4.5 MCG/ACT inhaler [id number] Yes Inhale 2 puffs into the lungs. [provider]  Active Child  venlafaxine  XR (EFFEXOR -XR) 75 MG 24 hr capsule [id number] Yes TAKE 3 CAPSULES BY MOUTH ONCE DAILY WITH BREAKFAST Karamalegos, Marsa PARAS, DO  Active Child            Home Care and Equipment/Supplies: Were Home Health Services Ordered?: Yes Name of Home Health Agency:: [facility] Has Agency set up a time to come to your home?: Yes First Home Health Visit Date: 10/24/23  Functional Questionnaire: Do you need assistance with bathing/showering or dressing?: Yes Do you need assistance with meal preparation?: Yes Do you need assistance with eating?: No Do you have difficulty maintaining continence: Yes  (Just getting to bathroom quickly due to shortness of breath. Using depends, pull ups.) Do you need assistance with getting out of bed/getting out of a chair/moving?: Yes (Weak from hospitalization, has help at home.) Do you have difficulty managing or taking your medications?:  (Daughter assists.)  Follow up appointments reviewed: PCP Follow-up appointment confirmed?: Yes Date of PCP follow-up appointment?: 10/29/23 Follow-up Provider: Dr Larose Do you need transportation to your follow-up appointment?: No Do you understand care options if your condition(s) worsen?: Yes-patient verbalized understanding (Per daughter, patient has a rescue plan, contact information, EMS.)  SDOH Interventions Today    Flowsheet Row Most Recent Value  SDOH Interventions   Food Insecurity Interventions Intervention Not Indicated  Transportation Interventions Intervention Not Indicated, Patient Resources (Friends/Family)  Utilities Interventions Intervention Not Indicated  Health Literacy Interventions Intervention Not Indicated    Goals Addressed             This Visit's Progress    VBCI Transitions of Care (TOC) Care Plan       Problems:  Recent Hospitalization for treatment of Pulmonary Disease Knowledge Deficit Related to Respiratory Failure/pneumonia  Goal:  Over the next 30 days, the patient will not experience hospital readmission  Interventions:  Transitions of Care: Doctor Visits  - discussed the importance of doctor visits Troubleshoot use of DME in the home  Post discharge activity limitations prescribed by provider reviewed Discussed oxygen safety/fire safety and to let local fire station know of oxygen use at home.  Reviewed Discharge instructions patient did not receive at discharge, medications review, SDOH, allergies, functional status.   Patient Self Care Activities:  Attend all scheduled provider appointments Call pharmacy for medication refills 3-7 days in advance of  running out of medications Call provider office for new concerns or questions  Notify RN Care Manager of TOC call rescheduling needs Participate in Transition of Care Program/Attend TOC scheduled calls Perform all self care activities independently  Take medications as prescribed   Continue to monitor SpO2, weights, blood pressure, blood sugars at home.   Plan:  An initial telephone outreach has been scheduled for: /30 at 130 pm with Bing Edison RN CM  Next PCP appointment scheduled for: 10/29/23 Discuss/review/educate on other conditions as needed.  Confirm HH visits.  Reassess respiratory status/anxiety.            Bing Edison MSN, RN RN Case Sales executive Health  [facility]Population [facility] Office Hours M-F [phone] Direct Dial: [phone] Main Phone [phone]  Fax: [phone] [email or web address]

## 2023-10-23 NOTE — Patient Instructions (Signed)
 [name], thank you for joining Delon CHRISTELLA Dickinson, PA-C for today's virtual visit.  While this provider is not your primary care provider (PCP), if your PCP is located in our provider database this encounter information will be shared with them immediately following your visit.   A [facility] MyChart account gives you access to today's visit and all your visits, tests, and labs performed at [facility]  click here if you don't have a [facility] MyChart account or go to [email or web address]  Consent: (Patient) Elizabeth [name] provided verbal consent for this virtual visit at the beginning of the encounter.  Current Medications:  Current Outpatient Medications:    apixaban  (ELIQUIS ) 5 MG TABS tablet, Take 1 tablet (5 mg total) by mouth 2 (two) times daily., Disp: 60 tablet, Rfl: 0   busPIRone  (BUSPAR ) 5 MG tablet, Take 1 tablet (5 mg total) by mouth 3 (three) times daily., Disp: 90 tablet, Rfl: 0   cefUROXime  (CEFTIN ) 500 MG tablet, Take 1 tablet (500 mg total) by mouth 2 (two) times daily with a meal for 10 days., Disp: 20 tablet, Rfl: 0   saccharomyces boulardii (FLORASTOR) 250 MG capsule, Take 1 capsule (250 mg total) by mouth 2 (two) times daily., Disp: 30 capsule, Rfl: 0   albuterol  (VENTOLIN  HFA) 108 (90 Base) MCG/ACT inhaler, Inhale 2 puffs into the lungs every 6 (six) hours as needed for wheezing or shortness of breath., Disp: 8 g, Rfl: 5   ARIPiprazole  (ABILIFY ) 5 MG tablet, Take 1 tablet by mouth once daily, Disp: 90 tablet, Rfl: 1   benzonatate  (TESSALON ) 100 MG capsule, Take 1 capsule (100 mg total) by mouth 3 (three) times daily as needed for cough., Disp: 30 capsule, Rfl: 0   cyanocobalamin  (VITAMIN B12) 1000 MCG/ML injection, Inject 1 mL (1,000 mcg total) into the muscle every 30 (thirty) days., Disp: 3 mL, Rfl: 0   fluticasone  (FLONASE) 50 MCG/ACT nasal spray, Place 2 sprays into both nostrils daily., Disp: , Rfl: 1   furosemide  (LASIX ) 40 MG tablet, Take  40 mg by mouth.  Take 1 tablet (40 mg total) by mouth once daily, Disp: , Rfl:    losartan  (COZAAR ) 50 MG tablet, Take 1 tablet by mouth once daily, Disp: 90 tablet, Rfl: 0   MAGNESIUM -OXIDE 400 (240 Mg) MG tablet, Take 1 tablet by mouth once daily, Disp: 90 tablet, Rfl: 0   metoprolol  succinate (TOPROL -XL) 25 MG 24 hr tablet, Take 1 tablet (25 mg total) by mouth daily. Take with or immediately following a meal., Disp: 90 tablet, Rfl: 1   Multiple Vitamin (MULTI-VITAMINS) TABS, Take by mouth., Disp: , Rfl:    Nebulizers (PARI PRONEB MAX LC SPRINT) MISC, , Disp: , Rfl:    nicotine  (NICODERM CQ  - DOSED IN MG/24 HOURS) 14 mg/24hr patch, Place 1 patch (14 mg total) onto the skin daily., Disp: 28 patch, Rfl: 0   OHTUVAYRE  3 MG/2.5ML SUSP, Take 3 mg by nebulization in the morning and at bedtime., Disp: , Rfl:    omeprazole  (PRILOSEC) 20 MG capsule, TAKE 1 CAPSULE BY MOUTH ONCE DAILY BEFORE BREAKFAST, Disp: 90 capsule, Rfl: 0   potassium chloride  SA (KLOR-CON  M) 20 MEQ tablet, Take 1 tablet by mouth once daily, Disp: 90 tablet, Rfl: 0   rosuvastatin  (CRESTOR ) 40 MG tablet, Take 40 mg by mouth daily., Disp: , Rfl:    RYBELSUS  3 MG TABS, Take 1 tablet (3 mg total) by mouth daily., Disp: , Rfl:    SPIRIVA  HANDIHALER 18 MCG  inhalation capsule, Place 1 capsule (18 mcg total) into inhaler and inhale daily., Disp: 90 capsule, Rfl: 3   SYMBICORT 160-4.5 MCG/ACT inhaler, Inhale 2 puffs into the lungs., Disp: , Rfl:    venlafaxine  XR (EFFEXOR -XR) 75 MG 24 hr capsule, TAKE 3 CAPSULES BY MOUTH ONCE DAILY WITH BREAKFAST, Disp: 270 capsule, Rfl: 0   Medications ordered in this encounter:  Meds ordered this encounter  Medications   busPIRone  (BUSPAR ) 5 MG tablet    Sig: Take 1 tablet (5 mg total) by mouth 3 (three) times daily.    Dispense:  90 tablet    Refill:  0    Supervising Provider:   LAMPTEY, PHILIP O [[id number]]   cefUROXime  (CEFTIN ) 500 MG tablet    Sig: Take 1 tablet (500 mg total) by mouth 2 (two) times  daily with a meal for 10 days.    Dispense:  20 tablet    Refill:  0    Supervising Provider:   LAMPTEY, PHILIP O [[id number]]   apixaban  (ELIQUIS ) 5 MG TABS tablet    Sig: Take 1 tablet (5 mg total) by mouth 2 (two) times daily.    Dispense:  60 tablet    Refill:  0    Supervising Provider:   LAMPTEY, PHILIP O [[id number]]   saccharomyces boulardii (FLORASTOR) 250 MG capsule    Sig: Take 1 capsule (250 mg total) by mouth 2 (two) times daily.    Dispense:  30 capsule    Refill:  0    Supervising Provider:   BLAISE ALEENE KIDD [[id number]]     *If you need refills on other medications prior to your next appointment, please contact your pharmacy*  Follow-Up: Call back or seek an in-person evaluation if the symptoms worsen or if the condition fails to improve as anticipated.  [facility] [phone]  Other Instructions Managing Anxiety This video describes anxiety and what can be done to manage it. To view the content, go to this web address: [email or web address]  This video will expire on: 03/13/2025. If you need access to this video following this date, please reach out to the healthcare provider who assigned it to you. This information is not intended to replace advice given to you by your health care provider. Make sure you discuss any questions you have with your health care provider. Elsevier Patient Education  2024 [employer]vier Inc.   If you have been instructed to have an in-person evaluation today at a local Urgent Care facility, please use the link below. It will take you to a list of all of our available [facility], including address, phone number and hours of operation. Please do not delay care.  [facility]  If you or a family member do not have a primary care provider, use the link below to schedule a visit and establish care. When you choose a [facility] primary care physician or advanced practice provider, you gain a  long-term partner in health. Find a Primary Care Provider  Learn more about [facility] in-office and virtual care options: [facility] - Get Care Now

## 2023-10-23 NOTE — Progress Notes (Signed)
 Virtual Visit Consent   Elizabeth [name], you are scheduled for a virtual visit with a [facility] provider today. Just as with appointments in the office, your consent must be obtained to participate. Your consent will be active for this visit and any virtual visit you may have with one of our providers in the next 365 days. If you have a MyChart account, a copy of this consent can be sent to you electronically.  As this is a virtual visit, video technology does not allow for your provider to perform a traditional examination. This may limit your provider's ability to fully assess your condition. If your provider identifies any concerns that need to be evaluated in person or the need to arrange testing (such as labs, EKG, etc.), we will make arrangements to do so. Although advances in technology are sophisticated, we cannot ensure that it will always work on either your end or our end. If the connection with a video visit is poor, the visit may have to be switched to a telephone visit. With either a video or telephone visit, we are not always able to ensure that we have a secure connection.  By engaging in this virtual visit, you consent to the provision of healthcare and authorize for your insurance to be billed (if applicable) for the services provided during this visit. Depending on your insurance coverage, you may receive a charge related to this service.  I need to obtain your verbal consent now. Are you willing to proceed with your visit today? Elizabeth [name] has provided verbal consent on 10/23/2023 for a virtual visit (video or telephone). Elizabeth CHRISTELLA Dickinson, PA-C  Date: 10/23/2023 9:42 AM   Virtual Visit via Video Note   I, Elizabeth [name], connected with  Elizabeth [name]  ([id number], 08-Mar-1951) on 10/23/23 at  8:45 AM EDT by a video-enabled telemedicine application and verified that I am speaking with the correct person using two identifiers.  Location: Patient: Virtual Visit Location  Patient: Home Provider: Virtual Visit Location Provider: Home Office   I discussed the limitations of evaluation and management by telemedicine and the availability of in person appointments. The patient expressed understanding and agreed to proceed.    History of Present Illness: Elizabeth [name] is a 73 y.o. who identifies as a female who was assigned female at birth, and is being seen today for anxiety. Was recently hospitalized from 10/18/23 - 10/22/23 for bilateral pneumonia with respiratory failure and sepsis. She was also hospitalized on 10/09/23 for new onset atrial fibrillation. She is following up today for anxiety. This has been a long standing issue, but has worsened since hospitalization. She and her daughter report that they were told medications were going to be sent in at her discharge, but they did not receive them.    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) 07/04/2021   OSA on CPAP 06/26/2019   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 03/11/2018   Senile nuclear sclerosis, left 03/11/2018   Substance abuse (HCC)    Genetic testing    Pernicious anemia 11/27/2016  Normocytic anemia 10/16/2016   Iron deficiency anemia 10/16/2016   Centrilobular emphysema (HCC)    Major depressive disorder, recurrent, moderate (HCC) 09/24/2016   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   Bilateral carpal tunnel syndrome 05/27/2015   Bilateral hand numbness 05/17/2015   Fracture of humerus, proximal, closed 03/23/2013   Lipoma of skin and subcutaneous  tissue 12/26/2011   B12 deficiency 02/15/2011   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   Dyspepsia and other specified disorders of function of stomach 10/11/2010   Benign essential hypertension 10/11/2010   Mixed hyperlipidemia 10/11/2010   Tobacco use disorder 10/11/2010   Anxiety state 08/25/2009   Shortness of breath 07/24/2006    Allergies:  Allergies  Allergen Reactions   Moxifloxacin Itching, Other (See Comments), Photosensitivity, Rash and Swelling    Causes swelling, redness, burning in eyes    Levofloxacin Other (See Comments)    Other reaction(s): Muscle Pain  Other reaction(s): Muscle Pain Other reaction(s): Muscle Pain   Amoxicillin -Pot Clavulanate Nausea Only and Other (See Comments)    Other Reaction: DIARRHEA.  Other reaction(s): Other (See Comments)  Other Reaction: DIARRHEA. Other reaction(s): Other (See Comments) Other Reaction: DIARRHEA. Other Reaction: DIARRHEA.  Other reaction(s): Other (See Comments) Other Reaction: DIARRHEA.    Other Reaction: DIARRHEA. Other Reaction: DIARRHEA.  Other reaction(s): Other (See Comments) Other Reaction: DIARRHEA.   Medications:  Current Outpatient Medications:    apixaban  (ELIQUIS ) 5 MG TABS tablet, Take 1 tablet (5 mg total) by mouth 2 (two) times daily., Disp: 60 tablet, Rfl: 0   busPIRone  (BUSPAR ) 5 MG tablet, Take 1 tablet (5 mg total) by mouth 3 (three) times daily., Disp: 90 tablet, Rfl: 0   cefUROXime  (CEFTIN ) 500 MG tablet, Take 1 tablet (500 mg total) by mouth 2 (two) times daily with a meal for 10 days., Disp: 20 tablet, Rfl: 0   saccharomyces boulardii (FLORASTOR) 250 MG capsule, Take 1 capsule (250 mg total) by mouth 2 (two) times daily., Disp: 30 capsule, Rfl: 0   albuterol  (VENTOLIN  HFA) 108 (90 Base) MCG/ACT inhaler, Inhale 2 puffs into the lungs every 6 (six) hours as needed for wheezing or shortness of breath., Disp: 8 g, Rfl: 5   ARIPiprazole  (ABILIFY ) 5 MG tablet, Take 1 tablet by mouth once  daily, Disp: 90 tablet, Rfl: 1   benzonatate  (TESSALON ) 100 MG capsule, Take 1 capsule (100 mg total) by mouth 3 (three) times daily as needed for cough., Disp: 30 capsule, Rfl: 0   cyanocobalamin  (VITAMIN B12) 1000 MCG/ML injection, Inject 1 mL (1,000 mcg total) into the muscle every 30 (thirty) days., Disp: 3 mL, Rfl: 0   fluticasone  (FLONASE) 50 MCG/ACT nasal spray, Place 2 sprays into both nostrils daily., Disp: , Rfl: 1   furosemide  (LASIX ) 40 MG tablet, Take 40 mg by mouth.  Take 1 tablet (40 mg total) by mouth once daily, Disp: , Rfl:    losartan  (COZAAR ) 50 MG tablet, Take 1 tablet by mouth once daily, Disp: 90 tablet, Rfl: 0   MAGNESIUM -OXIDE 400 (240 Mg) MG tablet, Take 1 tablet by mouth once daily, Disp: 90 tablet, Rfl: 0   metoprolol  succinate (TOPROL -XL) 25 MG 24 hr tablet, Take 1 tablet (25 mg total) by mouth daily. Take with or immediately following a meal., Disp: 90 tablet, Rfl: 1   Multiple Vitamin (MULTI-VITAMINS) TABS, Take by mouth., Disp: , Rfl:    Nebulizers (PARI PRONEB MAX LC SPRINT) MISC, , Disp: , Rfl:    nicotine  (NICODERM CQ  - DOSED IN MG/24  HOURS) 14 mg/24hr patch, Place 1 patch (14 mg total) onto the skin daily., Disp: 28 patch, Rfl: 0   OHTUVAYRE  3 MG/2.5ML SUSP, Take 3 mg by nebulization in the morning and at bedtime., Disp: , Rfl:    omeprazole  (PRILOSEC) 20 MG capsule, TAKE 1 CAPSULE BY MOUTH ONCE DAILY BEFORE BREAKFAST, Disp: 90 capsule, Rfl: 0   potassium chloride  SA (KLOR-CON  M) 20 MEQ tablet, Take 1 tablet by mouth once daily, Disp: 90 tablet, Rfl: 0   rosuvastatin  (CRESTOR ) 40 MG tablet, Take 40 mg by mouth daily., Disp: , Rfl:    RYBELSUS  3 MG TABS, Take 1 tablet (3 mg total) by mouth daily., Disp: , Rfl:    SPIRIVA  HANDIHALER 18 MCG inhalation capsule, Place 1 capsule (18 mcg total) into inhaler and inhale daily., Disp: 90 capsule, Rfl: 3   SYMBICORT 160-4.5 MCG/ACT inhaler, Inhale 2 puffs into the lungs., Disp: , Rfl:    venlafaxine  XR (EFFEXOR -XR) 75 MG 24  hr capsule, TAKE 3 CAPSULES BY MOUTH ONCE DAILY WITH BREAKFAST, Disp: 270 capsule, Rfl: 0  Observations/Objective: Patient is well-developed, well-nourished in no acute distress.  Resting comfortably at home.  Head is normocephalic, atraumatic.  No labored breathing.  Speech is clear and coherent with logical content.  Patient is alert and oriented at baseline.    Assessment and Plan: 1. Atrial fibrillation, unspecified type (HCC) (Primary) - apixaban  (ELIQUIS ) 5 MG TABS tablet; Take 1 tablet (5 mg total) by mouth 2 (two) times daily.  Dispense: 60 tablet; Refill: 0  2. Pneumonia of both lower lobes due to infectious organism - cefUROXime  (CEFTIN ) 500 MG tablet; Take 1 tablet (500 mg total) by mouth 2 (two) times daily with a meal for 10 days.  Dispense: 20 tablet; Refill: 0  3. Anxiety - busPIRone  (BUSPAR ) 5 MG tablet; Take 1 tablet (5 mg total) by mouth 3 (three) times daily.  Dispense: 90 tablet; Refill: 0  - Upon review the medications had been sent to [facility] pharmacy but family was not aware. - They request medications to be sent to local pharmacy instead - I have refilled Ceftin  (complete 10 more days outpatient for pneumonia), Buspirone  5mg  three times daily for anxiety, Eliquis  5mg  twice daily for atrial fibrillaiton, and Florastor probiotic  - Keep scheduled follow up with PCP - Seek in person evaluation if any symptoms worsen  Follow Up Instructions: I discussed the assessment and treatment plan with the patient. The patient was provided an opportunity to ask questions and all were answered. The patient agreed with the plan and demonstrated an understanding of the instructions.  A copy of instructions were sent to the patient via MyChart unless otherwise noted below.    The patient was advised to call back or seek an in-person evaluation if the symptoms worsen or if the condition fails to improve as anticipated.    Elizabeth CHRISTELLA Dickinson, PA-C

## 2023-10-23 NOTE — Patient Instructions (Signed)
 Visit Information  Thank you for taking time to visit with me today. Please don't hesitate to contact me if I can be of assistance to you before our next scheduled telephone appointment.  Our next appointment is by telephone on 10/30/23 at 130pm  Following is a copy of your care plan:   Goals Addressed             This Visit's Progress    [facility]BCI Transitions of Care (TOC) Care Plan       Problems:  Recent Hospitalization for treatment of Pulmonary Disease Knowledge Deficit Related to Respiratory Failure/pneumonia  Goal:  Over the next 30 days, the patient will not experience hospital readmission  Interventions:  Transitions of Care: Doctor Visits  - discussed the importance of doctor visits Troubleshoot use of DME in the home  Post discharge activity limitations prescribed by provider reviewed Discussed oxygen safety/fire safety and to let local fire station know of oxygen use at home.  Reviewed Discharge instructions patient did not receive at discharge, medications review, SDOH, allergies, functional status.   Patient Self Care Activities:  Attend all scheduled provider appointments Call pharmacy for medication refills 3-7 days in advance of running out of medications Call provider office for new concerns or questions  Notify RN Care Manager of TOC call rescheduling needs Participate in Transition of Care Program/Attend TOC scheduled calls Perform all self care activities independently  Take medications as prescribed   Continue to monitor SpO2, weights, blood pressure, blood sugars at home.   Plan:  An initial telephone outreach has been scheduled for: /30 at 130 pm with Bing Edison RN CM  Next PCP appointment scheduled for: 10/29/23 Discuss/review/educate on other conditions as needed.  Confirm HH visits.  Reassess respiratory status/anxiety.         Patient verbalizes understanding of instructions and care plan provided today and agrees to view in MyChart. Active  MyChart status and patient understanding of how to access instructions and care plan via MyChart confirmed with patient.     The patient has been provided with contact information for the care management team and has been advised to call with any health related questions or concerns.   Please call the care guide team at [phone] if you need to cancel or reschedule your appointment.   Please call the Suicide and Crisis Lifeline: 988 call the [place]  National Suicide Prevention Lifeline: (972)119-4409 or TTY: 440-563-4337 TTY (845) 309-0210) to talk to a trained counselor call 1-800-273-TALK (toll free, 24 hour hotline) if you are experiencing a Mental Health or Behavioral Health Crisis or need someone to talk to.   Bing Edison MSN, RN RN Case Sales executive Health  [facility]-Population Health Office Hours M-F [phone] Direct Dial: [phone] Main Phone [phone]  Fax: [phone] [email or web address]

## 2023-10-25 ENCOUNTER — Ambulatory Visit: Payer: Self-pay

## 2023-10-25 DIAGNOSIS — E119 Type 2 diabetes mellitus without complications: Secondary | ICD-10-CM | POA: Diagnosis not present

## 2023-10-25 DIAGNOSIS — I10 Essential (primary) hypertension: Secondary | ICD-10-CM | POA: Diagnosis not present

## 2023-10-25 DIAGNOSIS — I4891 Unspecified atrial fibrillation: Secondary | ICD-10-CM | POA: Diagnosis not present

## 2023-10-25 DIAGNOSIS — N179 Acute kidney failure, unspecified: Secondary | ICD-10-CM | POA: Diagnosis not present

## 2023-10-25 DIAGNOSIS — A419 Sepsis, unspecified organism: Secondary | ICD-10-CM | POA: Diagnosis not present

## 2023-10-25 DIAGNOSIS — F32A Depression, unspecified: Secondary | ICD-10-CM | POA: Diagnosis not present

## 2023-10-25 DIAGNOSIS — J189 Pneumonia, unspecified organism: Secondary | ICD-10-CM | POA: Diagnosis not present

## 2023-10-25 DIAGNOSIS — J432 Centrilobular emphysema: Secondary | ICD-10-CM | POA: Diagnosis not present

## 2023-10-25 DIAGNOSIS — J9601 Acute respiratory failure with hypoxia: Secondary | ICD-10-CM | POA: Diagnosis not present

## 2023-10-25 NOTE — Telephone Encounter (Signed)
 Requested Prescriptions  Pending Prescriptions Disp Refills   furosemide  (LASIX ) 20 MG tablet [Pharmacy Med Name: Furosemide  20 MG Oral Tablet] 90 tablet 0    Sig: Take 1 tablet by mouth once daily     Cardiovascular:  Diuretics - Loop Failed - 10/25/2023 10:20 AM      Failed - K in normal range and within 180 days    Potassium  Date Value Ref Range Status  10/22/2023 3.4 (L) 3.5 - 5.1 mmol/L Final  05/06/2014 3.5 3.5 - 5.1 mmol/L Final         Failed - Ca in normal range and within 180 days    Calcium   Date Value Ref Range Status  10/22/2023 8.4 (L) 8.9 - 10.3 mg/dL Final   Calcium , Total  Date Value Ref Range Status  05/06/2014 8.8 8.5 - 10.1 mg/dL Final         Failed - Mg Level in normal range and within 180 days    Magnesium   Date Value Ref Range Status  10/19/2023 2.7 (H) 1.7 - 2.4 mg/dL Final    Comment:    Performed at [facility], [address] [place], [place] [address]         Failed - Last BP in normal range    BP Readings from Last 1 Encounters:  10/22/23 (!) 164/65         Passed - Na in normal range and within 180 days    Sodium  Date Value Ref Range Status  10/22/2023 140 135 - 145 mmol/L Final  05/06/2014 140 136 - 145 mmol/L Final         Passed - Cr in normal range and within 180 days    Creat  Date Value Ref Range Status  08/27/2023 0.81 0.60 - 1.00 mg/dL Final   Creatinine, Ser  Date Value Ref Range Status  10/22/2023 0.84 0.44 - 1.00 mg/dL Final   Creatinine, Urine  Date Value Ref Range Status  08/27/2023 229 20 - 275 mg/dL Final         Passed - Cl in normal range and within 180 days    Chloride  Date Value Ref Range Status  10/22/2023 103 98 - 111 mmol/L Final  05/06/2014 105 98 - 107 mmol/L Final         Passed - Valid encounter within last 6 months    Recent Outpatient Visits           2 weeks ago Atrial fibrillation with [facility])   [place] [facility] Marsa PARAS, DO   2  weeks ago Weakness   [place] [facility] Everlene Parris LABOR, MD   1 month ago Annual physical exam   [place] [facility] Edman Marsa PARAS, DO   4 months ago Type 2 diabetes mellitus with other specified complication, without long-term current use of insulin  [place] [facility])   [place] [facility] Edman Marsa PARAS, DO       Future Appointments             In 4 days Edman Marsa PARAS, DO [facility]             venlafaxine  XR (EFFEXOR -XR) 75 MG 24 hr capsule [Pharmacy Med Name: Venlafaxine  HCl ER 75 MG Oral Capsule Extended Release 24 Hour] 270 capsule 0    Sig: TAKE 3 CAPSULES BY MOUTH ONCE DAILY WITH BREAKFAST  Psychiatry: Antidepressants - SNRI - desvenlafaxine & venlafaxine  Failed - 10/25/2023 10:20 AM      Failed - Last BP in normal range    BP Readings from Last 1 Encounters:  10/22/23 (!) 164/65         Failed - Lipid Panel in normal range within the last 12 months    Cholesterol  Date Value Ref Range Status  08/27/2023 190 <200 mg/dL Final   LDL Cholesterol (Calc)  Date Value Ref Range Status  08/27/2023 104 (H) mg/dL (calc) Final    Comment:    Reference range: <100 . Desirable range <100 mg/dL for primary prevention;   <70 mg/dL for patients with CHD or diabetic patients  with > or = 2 CHD risk factors. SABRA LDL-C is now calculated using the Martin-Hopkins  calculation, which is a validated novel method providing  better accuracy than the Friedewald equation in the  estimation of LDL-C.  Gladis APPLETHWAITE et al. SANDREA. 7986;689(80): 2061-2068  (http://education.[employer].com/faq/FAQ164)    HDL  Date Value Ref Range Status  08/27/2023 62 > OR = 50 mg/dL Final   Triglycerides  Date Value Ref Range Status  08/27/2023 137 <150 mg/dL Final         Passed - Cr in normal range and within 360 days    Creat  Date Value Ref Range Status  08/27/2023 0.81  0.60 - 1.00 mg/dL Final   Creatinine, Ser  Date Value Ref Range Status  10/22/2023 0.84 0.44 - 1.00 mg/dL Final   Creatinine, Urine  Date Value Ref Range Status  08/27/2023 229 20 - 275 mg/dL Final         Passed - Completed PHQ-2 or PHQ-9 in the last 360 days      Passed - Valid encounter within last 6 months    Recent Outpatient Visits           2 weeks ago Atrial fibrillation with [facility])   [place] [facility] Marsa PARAS, DO   2 weeks ago Weakness   [facility] Everlene Parris LABOR, MD   1 month ago Annual physical exam   [place] [facility] Ed[facility] Marsa PARAS, DO   4 months ago Type 2 diabetes mellitus with other specified complication, without long-term current use of insulin  [facility])   [place] [facility] [place], Marsa PARAS, DO       Future Appointments             In 4 days Edman, Marsa PARAS, DO [facility]             MAGNESIUM -OXIDE 400 (240 Mg) MG tablet [Pharmacy Med Name: MAGnesium -Oxide 400 (241.3 Mg) MG Oral Tablet] 90 tablet 0    Sig: Take 1 tablet by mouth once daily     Endocrinology:  Minerals - Magnesium  Supplementation Failed - 10/25/2023 10:20 AM      Failed - Mg Level in normal range and within 360 days    Magnesium   Date Value Ref Range Status  10/19/2023 2.7 (H) 1.7 - 2.4 mg/dL Final    Comment:    Performed at [facility], [address] [place], [place] [address]         Passed - Cr in normal range and within 360 days    Creat  Date Value Ref Range Status  08/27/2023 0.81 0.60 - 1.00 mg/dL Final   Creatinine, Ser  Date Value Ref Range Status  10/22/2023 0.84 0.44 - 1.00 mg/dL Final   Creatinine, Urine  Date Value Ref Range Status  08/27/2023 229 20 - 275 mg/dL Final         Passed - Valid encounter within last 12 months    Recent Outpatient Visits           2  weeks ago Atrial fibrillation with R[facility])   [place] [facility] [place], Marsa PARAS, DO   2 weeks ago Weakness   [place] [facility] Everlene Parris LABOR, MD   1 month ago Annual physical exam   [facility] Ed[facility] Marsa PARAS, DO   4 months ago Type 2 diabetes mellitus with other specified complication, without long-term current use of insulin  [facility])   [place] [facility], Marsa PARAS, DO       Future Appointments             In 4 days Edman, Marsa PARAS, DO  [facility]

## 2023-10-25 NOTE — Telephone Encounter (Signed)
 Lasix  20mg  D/C 10/15/23 Mag Ox refilled 10/15/23 # 90. Requested Prescriptions  Signed Prescriptions Disp Refills   venlafaxine  XR (EFFEXOR -XR) 75 MG 24 hr capsule 270 capsule 0    Sig: TAKE 3 CAPSULES BY MOUTH ONCE DAILY WITH BREAKFAST     Psychiatry: Antidepressants - SNRI - desvenlafaxine & venlafaxine  Failed - 10/25/2023 10:24 AM      Failed - Last BP in normal range    BP Readings from Last 1 Encounters:  10/22/23 (!) 164/65         Failed - Lipid Panel in normal range within the last 12 months    Cholesterol  Date Value Ref Range Status  08/27/2023 190 <200 mg/dL Final   LDL Cholesterol (Calc)  Date Value Ref Range Status  08/27/2023 104 (H) mg/dL (calc) Final    Comment:    Reference range: <100 . Desirable range <100 mg/dL for primary prevention;   <70 mg/dL for patients with CHD or diabetic patients  with > or = 2 CHD risk factors. SABRA LDL-C is now calculated using the Martin-Hopkins  calculation, which is a validated novel method providing  better accuracy than the Friedewald equation in the  estimation of LDL-C.  Gladis APPLETHWAITE et al. SANDREA. 7986;689(80): 2061-2068  (http://education.[facility].com/faq/FAQ164)    HDL  Date Value Ref Range Status  08/27/2023 62 > OR = 50 mg/dL Final   Triglycerides  Date Value Ref Range Status  08/27/2023 137 <150 mg/dL Final         Passed - Cr in normal range and within 360 days    Creat  Date Value Ref Range Status  08/27/2023 0.81 0.60 - 1.00 mg/dL Final   Creatinine, Ser  Date Value Ref Range Status  10/22/2023 0.84 0.44 - 1.00 mg/dL Final   Creatinine, Urine  Date Value Ref Range Status  08/27/2023 229 20 - 275 mg/dL Final         Passed - Completed PHQ-2 or PHQ-9 in the last 360 days      Passed - Valid encounter within last 6 months    Recent Outpatient Visits           2 weeks ago Atrial fibrillation with [facility])   [place] [facility] Marsa PARAS, DO   2 weeks ago  Weakness   [facility] Everlene Parris LABOR, MD   1 month ago Annual physical exam   [place] [facility] Marsa PARAS, DO   4 months ago Type 2 diabetes mellitus with other specified complication, without long-term current use of insulin  [facility])   [place] [facility] [place], Marsa PARAS, DO       Future Appointments             In 4 days Edman, Marsa PARAS, DO  [facility]            Refused Prescriptions Disp Refills   furosemide  (LASIX ) 20 MG tablet [Pharmacy Med Name: Furosemide  20 MG Oral Tablet] 90 tablet 0    Sig: Take 1 tablet by mouth once daily     Cardiovascular:  Diuretics - Loop Failed - 10/25/2023 10:24 AM      Failed - K in normal range and within 180 days    Potassium  Date Value Ref Range Status  10/22/2023 3.4 (L) 3.5 - 5.1 mmol/L Final  05/06/2014 3.5 3.5 - 5.1 mmol/L Final  Failed - Ca in normal range and within 180 days    Calcium   Date Value Ref Range Status  10/22/2023 8.4 (L) 8.9 - 10.3 mg/dL Final   Calcium , Total  Date Value Ref Range Status  05/06/2014 8.8 8.5 - 10.1 mg/dL Final         Failed - Mg Level in normal range and within 180 days    Magnesium   Date Value Ref Range Status  10/19/2023 2.7 (H) 1.7 - 2.4 mg/dL Final    Comment:    Performed at [facility], [address] [place], [place] [address]         Failed - Last BP in normal range    BP Readings from Last 1 Encounters:  10/22/23 (!) 164/65         Passed - Na in normal range and within 180 days    Sodium  Date Value Ref Range Status  10/22/2023 140 135 - 145 mmol/L Final  05/06/2014 140 136 - 145 mmol/L Final         Passed - Cr in normal range and within 180 days    Creat  Date Value Ref Range Status  08/27/2023 0.81 0.60 - 1.00 mg/dL Final   Creatinine, Ser  Date Value Ref Range Status  10/22/2023 0.84 0.44 - 1.00 mg/dL  Final   Creatinine, Urine  Date Value Ref Range Status  08/27/2023 229 20 - 275 mg/dL Final         Passed - Cl in normal range and within 180 days    Chloride  Date Value Ref Range Status  10/22/2023 103 98 - 111 mmol/L Final  05/06/2014 105 98 - 107 mmol/L Final         Passed - Valid encounter within last 6 months    Recent Outpatient Visits           2 weeks ago Atrial fibrillation with [facility])   [place] [facility] Ed[facility] Marsa PARAS, DO   2 weeks ago Weakness   [place] Everlene Parris LABOR, MD   1 month ago Annual physical exam   [place] [facility] [place], Marsa PARAS, DO   4 months ago Type 2 diabetes mellitus with other specified complication, without long-term current use of insulin  [facility])   [facility], Marsa PARAS, DO       Future Appointments             In 4 days Edman, Marsa PARAS, DO [facility]             MAGNESIUM -OXIDE 400 (240 Mg) MG tablet [Pharmacy Med Name: MAGnesium -Oxide 400 (241.3 Mg) MG Oral Tablet] 90 tablet 0    Sig: Take 1 tablet by mouth once daily     Endocrinology:  Minerals - Magnesium  Supplementation Failed - 10/25/2023 10:24 AM      Failed - Mg Level in normal range and within 360 days    Magnesium   Date Value Ref Range Status  10/19/2023 2.7 (H) 1.7 - 2.4 mg/dL Final    Comment:    Performed at [facility], [address] [place], [place] [address]         Passed - Cr in normal range and within 360 days    Creat  Date Value Ref Range Status  08/27/2023 0.81 0.60 - 1.00 mg/dL Final   Creatinine, Ser  Date Value Ref Range Status  10/22/2023 0.84 0.44 - 1.00 mg/dL Final   Creatinine, Urine  Date Value Ref Range Status  08/27/2023 229 20 - 275 mg/dL Final         Passed - Valid encounter within last 12 months    Recent Outpatient Visits            2 weeks ago Atrial fibrillation with [facility])   [place] [facility], Marsa PARAS, DO   2 weeks ago Weakness   [place] [facility] Everlene Parris LABOR, MD   1 month ago Annual physical exam   [place] [facility] Ed[facility] Marsa PARAS, DO   4 months ago Type 2 diabetes mellitus with other specified complication, without long-term current use of insulin  [facility])   [place] [facility] [place], Marsa PARAS, DO       Future Appointments             In 4 days Edman, Marsa PARAS, DO [place] [facility]

## 2023-10-26 ENCOUNTER — Telehealth: Admitting: Family Medicine

## 2023-10-26 DIAGNOSIS — J189 Pneumonia, unspecified organism: Secondary | ICD-10-CM | POA: Diagnosis not present

## 2023-10-26 MED ORDER — CEFUROXIME AXETIL 500 MG PO TABS: 500.0000 mg | ORAL_TABLET | Freq: Two times a day (BID) | ORAL | 0 refills | Status: AC

## 2023-10-26 NOTE — Progress Notes (Signed)
 Virtual Visit Consent   Elizabeth [name], you are scheduled for a virtual visit with a [facility] provider today. Just as with appointments in the office, your consent must be obtained to participate. Your consent will be active for this visit and any virtual visit you may have with one of our providers in the next 365 days. If you have a MyChart account, a copy of this consent can be sent to you electronically.  As this is a virtual visit, video technology does not allow for your provider to perform a traditional examination. This may limit your provider's ability to fully assess your condition. If your provider identifies any concerns that need to be evaluated in person or the need to arrange testing (such as labs, EKG, etc.), we will make arrangements to do so. Although advances in technology are sophisticated, we cannot ensure that it will always work on either your end or our end. If the connection with a video visit is poor, the visit may have to be switched to a telephone visit. With either a video or telephone visit, we are not always able to ensure that we have a secure connection.  By engaging in this virtual visit, you consent to the provision of healthcare and authorize for your insurance to be billed (if applicable) for the services provided during this visit. Depending on your insurance coverage, you may receive a charge related to this service.  I need to obtain your verbal consent now. Are you willing to proceed with your visit today? Elizabeth [name] has provided verbal consent on 10/26/2023 for a virtual visit (video or telephone). Elizabeth [name], [place]  Date: 10/26/2023 11:56 AM   Virtual Visit via Video Note   I, Elizabeth [name], connected with  Elizabeth [name]  ([id number], [birth year]) on 10/26/23 at 11:45 AM EDT by a video-enabled telemedicine application and verified that I am speaking with the correct person using two identifiers.  Location: Patient: Virtual Visit Location Patient:  Home Provider: Virtual Visit Location Provider: Home Office   I discussed the limitations of evaluation and management by telemedicine and the availability of in person appointments. The patient expressed understanding and agreed to proceed.    History of Present Illness: Elizabeth [name] is a 73 y.o. who identifies as a female who was assigned female at birth, and is being seen today for c/o states she wants her daughter to talk for her.  Daughter states the Pt spent 4 days in the hospital for pneumonia and sepsis.  Pt finished antibiotics that they sent her home with antibiotics and has finished them but is still not feeling well.  Pt states she feels very weak and not feeling great.  99.5-99.7 temp. Daughter denies delirium symptoms.  Daughter states she is eating very little but is drinking. Pt states she has an appointment next week. Pt states she wants to try another round of antibiotics because she doesn't want to go back into the hospital. Pt states she is coughing little but states she has the fever and headaches.  Pt states she wants to have antibiotics until she see's her doctor.   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 03/11/2018   Senile nuclear sclerosis, left 03/11/2018   Substance abuse (HCC)    Genetic testing    Pernicious anemia 11/27/2016   Normocytic anemia 10/16/2016   Iron deficiency anemia 10/16/2016   Centrilobular emphysema (HCC)     Major depressive disorder, recurrent, moderate (HCC) 09/24/2016   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   Bilateral carpal tunnel syndrome 05/27/2015   Bilateral hand numbness 05/17/2015   Fracture of humerus, proximal, closed 03/23/2013   Lipoma of skin and subcutaneous tissue 12/26/2011   B12 deficiency 02/15/2011   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   Dyspepsia and other specified disorders of function of stomach 10/11/2010   Benign essential hypertension 10/11/2010   Mixed hyperlipidemia 10/11/2010   Tobacco use disorder 10/11/2010   Anxiety state 08/25/2009   Shortness of breath 07/24/2006    Allergies:  Allergies  Allergen Reactions   Moxifloxacin Itching, Other (See Comments), Photosensitivity, Rash and Swelling    Causes swelling, redness, burning in eyes    Levofloxacin Other (See Comments)    Other reaction(s): Muscle Pain  Other reaction(s): Muscle Pain Other reaction(s): Muscle Pain   Amoxicillin -Pot Clavulanate Nausea Only and Other (See Comments)    Other Reaction: DIARRHEA.  Other reaction(s): Other (See Comments)  Other Reaction: DIARRHEA. Other reaction(s): Other (See Comments) Other Reaction: DIARRHEA. Other Reaction: DIARRHEA.  Other reaction(s): Other (See Comments) Other Reaction: DIARRHEA.    Other Reaction: DIARRHEA. Other Reaction: DIARRHEA.  Other reaction(s): Other (See Comments) Other Reaction: DIARRHEA.   Medications:  Current Outpatient Medications:    albuterol  (VENTOLIN  HFA) 108 (90 Base) MCG/ACT inhaler, Inhale 2 puffs into the lungs every 6 (six) hours as needed for wheezing or shortness of breath., Disp: 8 g, Rfl: 5   apixaban  (ELIQUIS ) 5 MG TABS tablet, Take 1 tablet (5 mg total) by mouth 2 (two) times daily., Disp: 60 tablet, Rfl: 0   ARIPiprazole  (ABILIFY ) 5 MG tablet, Take 1 tablet by mouth once daily, Disp: 90 tablet, Rfl: 1   benzonatate  (TESSALON ) 100 MG capsule, Take 1 capsule (100 mg total) by mouth 3 (three) times daily as needed  for cough. (Patient not taking: Reported on 10/23/2023), Disp: 30 capsule, Rfl: 0   busPIRone  (BUSPAR ) 5 MG tablet, Take 1 tablet (5 mg total) by mouth 3 (three) times daily., Disp: 90 tablet, Rfl: 0   cefUROXime  (CEFTIN ) 500 MG tablet, Take 1 tablet (500 mg total) by mouth 2 (two) times daily with a meal for 10 days., Disp: 20 tablet, Rfl: 0   cyanocobalamin  (VITAMIN B12) 1000 MCG/ML injection, Inject 1 mL (1,000 mcg total) into the muscle every 30 (thirty) days., Disp: 3 mL, Rfl: 0   fluticasone  (FLONASE ) 50 MCG/ACT nasal spray, Place 2 sprays into both nostrils daily., Disp: , Rfl: 1   furosemide  (LASIX ) 40 MG tablet, Take 40 mg by mouth.  Take 1 tablet (40 mg total) by mouth once daily, Disp: , Rfl:    losartan  (COZAAR ) 50 MG tablet, Take 1 tablet by mouth once daily, Disp: 90 tablet, Rfl: 0   MAGNESIUM -OXIDE 400 (240 Mg) MG tablet, Take 1 tablet by mouth once daily, Disp: 90 tablet, Rfl: 0   metoprolol  succinate (TOPROL -XL) 25 MG 24 hr tablet, Take 1 tablet (25 mg total) by mouth daily. Take with or immediately following a meal., Disp:  90 tablet, Rfl: 1   Multiple Vitamin (MULTI-VITAMINS) TABS, Take by mouth., Disp: , Rfl:    Nebulizers (PARI PRONEB MAX LC SPRINT) MISC, , Disp: , Rfl:    nicotine  (NICODERM CQ  - DOSED IN MG/24 HOURS) 14 mg/24hr patch, Place 1 patch (14 mg total) onto the skin daily. (Patient not taking: Reported on 10/23/2023), Disp: 28 patch, Rfl: 0   OHTUVAYRE  3 MG/2.5ML SUSP, Take 3 mg by nebulization in the morning and at bedtime., Disp: , Rfl:    omeprazole  (PRILOSEC) 20 MG capsule, TAKE 1 CAPSULE BY MOUTH ONCE DAILY BEFORE BREAKFAST, Disp: 90 capsule, Rfl: 0   potassium chloride  SA (KLOR-CON  M) 20 MEQ tablet, Take 1 tablet by mouth once daily, Disp: 90 tablet, Rfl: 0   rosuvastatin  (CRESTOR ) 40 MG tablet, Take 40 mg by mouth daily., Disp: , Rfl:    RYBELSUS  3 MG TABS, Take 1 tablet (3 mg total) by mouth daily. (Patient not taking: Reported on 10/23/2023), Disp: , Rfl:     saccharomyces boulardii (FLORASTOR) 250 MG capsule, Take 1 capsule (250 mg total) by mouth 2 (two) times daily., Disp: 30 capsule, Rfl: 0   SPIRIVA  HANDIHALER 18 MCG inhalation capsule, Place 1 capsule (18 mcg total) into inhaler and inhale daily., Disp: 90 capsule, Rfl: 3   SYMBICORT 160-4.5 MCG/ACT inhaler, Inhale 2 puffs into the lungs., Disp: , Rfl:    venlafaxine  XR (EFFEXOR -XR) 75 MG 24 hr capsule, TAKE 3 CAPSULES BY MOUTH ONCE DAILY WITH BREAKFAST, Disp: 270 capsule, Rfl: 0  Observations/Objective: Patient is well-developed, well-nourished in no acute distress.  Resting comfortably  at home.  Head is normocephalic, atraumatic.  No labored breathing.  Speech is clear and coherent with logical content.  Patient is alert and oriented at baseline.    Assessment and Plan: 1. Pneumonia of both lower lobes due to infectious organism - cefUROXime  (CEFTIN ) 500 MG tablet; Take 1 tablet (500 mg total) by mouth 2 (two) times daily with a meal for 10 days.  Dispense: 20 tablet; Refill: 0 -Medication refilled for Pt -Pt advised if worsening symptoms to proceed to the hospital for further evaluation  -Pt verbalized understanding.    Follow Up Instructions: I discussed the assessment and treatment plan with the patient. The patient was provided an opportunity to ask questions and all were answered. The patient agreed with the plan and demonstrated an understanding of the instructions.  A copy of instructions were sent to the patient via MyChart unless otherwise noted below.    The patient was advised to call back or seek an in-person evaluation if the symptoms worsen or if the condition fails to improve as anticipated.    Elizabeth Mater, PA-C

## 2023-10-26 NOTE — Patient Instructions (Signed)
 Elizabeth [name], thank you for joining [name], PA-C for today's virtual visit.  While this provider is not your primary care provider (PCP), if your PCP is located in our provider database this encounter information will be shared with them immediately following your visit.   A [facility] MyChart account gives you access to today's visit and all your visits, tests, and labs performed at [facility]  click here if you don't have a [facility] MyChart account or go to [email or web address]  Consent: (Patient) Elizabeth [name] provided verbal consent for this virtual visit at the beginning of the encounter.  Current Medications:  Current Outpatient Medications:    albuterol  (VENTOLIN  HFA) 108 (90 Base) MCG/ACT inhaler, Inhale 2 puffs into the lungs every 6 (six) hours as needed for wheezing or shortness of breath., Disp: 8 g, Rfl: 5   apixaban  (ELIQUIS ) 5 MG TABS tablet, Take 1 tablet (5 mg total) by mouth 2 (two) times daily., Disp: 60 tablet, Rfl: 0   ARIPiprazole  (ABILIFY ) 5 MG tablet, Take 1 tablet by mouth once daily, Disp: 90 tablet, Rfl: 1   benzonatate  (TESSALON ) 100 MG capsule, Take 1 capsule (100 mg total) by mouth 3 (three) times daily as needed for cough. (Patient not taking: Reported on 10/23/2023), Disp: 30 capsule, Rfl: 0   busPIRone  (BUSPAR ) 5 MG tablet, Take 1 tablet (5 mg total) by mouth 3 (three) times daily., Disp: 90 tablet, Rfl: 0   cefUROXime  (CEFTIN ) 500 MG tablet, Take 1 tablet (500 mg total) by mouth 2 (two) times daily with a meal for 10 days., Disp: 20 tablet, Rfl: 0   cyanocobalamin  (VITAMIN B12) 1000 MCG/ML injection, Inject 1 mL (1,000 mcg total) into the muscle every 30 (thirty) days., Disp: 3 mL, Rfl: 0   fluticasone  (FLONASE ) 50 MCG/ACT nasal spray, Place 2 sprays into both nostrils daily., Disp: , Rfl: 1   furosemide  (LASIX ) 40 MG tablet, Take 40 mg by mouth.  Take 1 tablet (40 mg total) by mouth once daily, Disp: , Rfl:    losartan  (COZAAR ) 50  MG tablet, Take 1 tablet by mouth once daily, Disp: 90 tablet, Rfl: 0   MAGNESIUM -OXIDE 400 (240 Mg) MG tablet, Take 1 tablet by mouth once daily, Disp: 90 tablet, Rfl: 0   metoprolol  succinate (TOPROL -XL) 25 MG 24 hr tablet, Take 1 tablet (25 mg total) by mouth daily. Take with or immediately following a meal., Disp: 90 tablet, Rfl: 1   Multiple Vitamin (MULTI-VITAMINS) TABS, Take by mouth., Disp: , Rfl:    Nebulizers (PARI PRONEB MAX LC SPRINT) MISC, , Disp: , Rfl:    nicotine  (NICODERM CQ  - DOSED IN MG/24 HOURS) 14 mg/24hr patch, Place 1 patch (14 mg total) onto the skin daily. (Patient not taking: Reported on 10/23/2023), Disp: 28 patch, Rfl: 0   OHTUVAYRE  3 MG/2.5ML SUSP, Take 3 mg by nebulization in the morning and at bedtime., Disp: , Rfl:    omeprazole  (PRILOSEC) 20 MG capsule, TAKE 1 CAPSULE BY MOUTH ONCE DAILY BEFORE BREAKFAST, Disp: 90 capsule, Rfl: 0   potassium chloride  SA (KLOR-CON  M) 20 MEQ tablet, Take 1 tablet by mouth once daily, Disp: 90 tablet, Rfl: 0   rosuvastatin  (CRESTOR ) 40 MG tablet, Take 40 mg by mouth daily., Disp: , Rfl:    RYBELSUS  3 MG TABS, Take 1 tablet (3 mg total) by mouth daily. (Patient not taking: Reported on 10/23/2023), Disp: , Rfl:    saccharomyces boulardii (FLORASTOR) 250 MG capsule, Take 1 capsule (250 mg total)  by mouth 2 (two) times daily., Disp: 30 capsule, Rfl: 0   SPIRIVA  HANDIHALER 18 MCG inhalation capsule, Place 1 capsule (18 mcg total) into inhaler and inhale daily., Disp: 90 capsule, Rfl: 3   SYMBICORT 160-4.5 MCG/ACT inhaler, Inhale 2 puffs into the lungs., Disp: , Rfl:    venlafaxine  XR (EFFEXOR -XR) 75 MG 24 hr capsule, TAKE 3 CAPSULES BY MOUTH ONCE DAILY WITH BREAKFAST, Disp: 270 capsule, Rfl: 0   Medications ordered in this encounter:  Meds ordered this encounter  Medications   cefUROXime  (CEFTIN ) 500 MG tablet    Sig: Take 1 tablet (500 mg total) by mouth 2 (two) times daily with a meal for 10 days.    Dispense:  20 tablet    Refill:  0      *If you need refills on other medications prior to your next appointment, please contact your pharmacy*  Follow-Up: Call back or seek an in-person evaluation if the symptoms worsen or if the condition fails to improve as anticipated.  [facility] [phone]  Other Instructions Community-Acquired Pneumonia, Adult Pneumonia is an infection of the lungs. It causes irritation and swelling in the airways of the lungs. Mucus and fluid may also build up inside the airways. This may cause coughing and trouble breathing. One type of pneumonia can happen while you are in a hospital. A different type can happen when you are not in a hospital (community-acquired pneumonia). What are the causes?  This condition is caused by germs (viruses, bacteria, or fungi). Some types of germs can spread from person to person. Pneumonia is not thought to spread from person to person. What increases the risk? You have a long-term (chronic) disease, such as: Disease of the lungs. This may be chronic obstructive pulmonary disease (COPD) or asthma. Heart failure. Cystic fibrosis. Diabetes. Kidney disease. Sickle cell disease. HIV. You have other health problems, such as: Your body's defense system (immune system) is weak. A condition that may cause you to breathe in fluids from your mouth and nose. You had your spleen taken out. You do not take good care of your teeth and mouth (poor dental hygiene). You use or have used tobacco products. You go where the germs that cause this illness are common. You are older than 74 years of age. What are the signs or symptoms? A cough. A fever. Sweating or chills. Chest pain, often when you breathe deeply or cough. Breathing problems, such as: Fast breathing. Trouble breathing. Shortness of breath. Feeling tired (fatigued). Muscle aches. How is this treated? Treatment for this condition depends on many things, such as: The cause of your  illness. Your medicines. Your other health problems. Most adults can be treated at home. Sometimes, treatment must happen in a hospital. Treatment may include medicines to kill germs. Medicines may depend on which germ caused your illness. Very bad pneumonia is rare. If you get it, you may: Have a machine to help you breathe. Have fluid taken away from around your lungs. Follow these instructions at home: Medicines Take over-the-counter and prescription medicines only as told by your doctor. Take cough medicine only if you are losing sleep. Cough medicine can keep your body from taking mucus away from your lungs. If you were prescribed antibiotics, take them as told by your doctor. Do not stop taking them even if you start to feel better. Lifestyle     Do not smoke or use any products that contain nicotine  or tobacco. If you need help  quitting, ask your doctor. Do not drink alcohol. Eat a healthy diet. This includes a lot of vegetables, fruits, whole grains, low-fat dairy products, and low-fat (lean) protein. General instructions  Rest a lot. Sleep for at least 8 hours each night. Sleep with your head and neck raised. Put a few pillows under your head or sleep in a reclining chair. Return to your normal activities as told by your doctor. Ask your doctor what activities are safe for you. Drink enough fluid to keep your pee (urine) pale yellow. If your throat is sore, gargle with a mixture of salt and water 3-4 times a day or as needed. To make salt water, completely dissolve -1 tsp (3-6 g) of salt in 1 cup (237 mL) of warm water. Keep all follow-up visits. How is this prevented? Getting the pneumonia shot (vaccine). These shots have different types and schedules. Ask your doctor what works best for you. Think about getting this shot if: You are older than 74 years of age. You are 58-65 years of age and: You are being treated for cancer. You have long-term lung disease. You have  other problems that affect your body's defense system. Ask your doctor if you have one of these. Getting your flu shot every year. Ask your doctor which type of shot is best for you. Going to the dentist as often as told. Washing your hands often with soap and water for at least 20 seconds. If you cannot use soap and water, use hand sanitizer. Contact a doctor if: You have a fever. You lose sleep because your cough medicine does not help. Get help right away if: You are short of breath and this gets worse. You have more chest pain. Your sickness gets worse. This is very serious if: You are an older adult. Your body's defense system is weak. You cough up blood. These symptoms may be an emergency. Get help right away. Call 911. Do not wait to see if the symptoms will go away. Do not drive yourself to the hospital. Summary Pneumonia is an infection of the lungs. Community-acquired pneumonia affects people who have not been in the hospital. Certain germs can cause this infection. This condition may be treated with medicines that kill germs. For very bad pneumonia, you may need a hospital stay and treatment to help with breathing. This information is not intended to replace advice given to you by your health care provider. Make sure you discuss any questions you have with your health care provider. Document Revised: 05/17/2021 Document Reviewed: 05/17/2021 Elsevier Patient Education  2024 [employer]   If you have been instructed to have an in-person evaluation today at a local Urgent Care facility, please use the link below. It will take you to a list of all of our available [facility], including address, phone number and hours of operation. Please do not delay care.  [facility]  If you or a family member do not have a primary care provider, use the link below to schedule a visit and establish care. When you choose a [facility] primary care physician or  advanced practice provider, you gain a long-term partner in health. Find a Primary Care Provider  Learn more about [facility] in-office and virtual care options:  - Get Care Now

## 2023-10-28 DIAGNOSIS — J189 Pneumonia, unspecified organism: Secondary | ICD-10-CM | POA: Diagnosis not present

## 2023-10-28 DIAGNOSIS — I4891 Unspecified atrial fibrillation: Secondary | ICD-10-CM | POA: Diagnosis not present

## 2023-10-28 DIAGNOSIS — A419 Sepsis, unspecified organism: Secondary | ICD-10-CM | POA: Diagnosis not present

## 2023-10-28 DIAGNOSIS — N179 Acute kidney failure, unspecified: Secondary | ICD-10-CM | POA: Diagnosis not present

## 2023-10-28 DIAGNOSIS — F32A Depression, unspecified: Secondary | ICD-10-CM | POA: Diagnosis not present

## 2023-10-28 DIAGNOSIS — J9601 Acute respiratory failure with hypoxia: Secondary | ICD-10-CM | POA: Diagnosis not present

## 2023-10-28 DIAGNOSIS — J432 Centrilobular emphysema: Secondary | ICD-10-CM | POA: Diagnosis not present

## 2023-10-28 DIAGNOSIS — I10 Essential (primary) hypertension: Secondary | ICD-10-CM | POA: Diagnosis not present

## 2023-10-28 DIAGNOSIS — E119 Type 2 diabetes mellitus without complications: Secondary | ICD-10-CM | POA: Diagnosis not present

## 2023-10-29 ENCOUNTER — Encounter: Payer: Self-pay | Admitting: Family Medicine

## 2023-10-29 ENCOUNTER — Ambulatory Visit (INDEPENDENT_AMBULATORY_CARE_PROVIDER_SITE_OTHER): Admitting: Family Medicine

## 2023-10-29 VITALS — BP 122/80 | HR 57 | Ht 62.0 in

## 2023-10-29 DIAGNOSIS — J189 Pneumonia, unspecified organism: Secondary | ICD-10-CM | POA: Diagnosis not present

## 2023-10-29 DIAGNOSIS — E1169 Type 2 diabetes mellitus with other specified complication: Secondary | ICD-10-CM

## 2023-10-29 DIAGNOSIS — D510 Vitamin B12 deficiency anemia due to intrinsic factor deficiency: Secondary | ICD-10-CM

## 2023-10-29 DIAGNOSIS — E119 Type 2 diabetes mellitus without complications: Secondary | ICD-10-CM | POA: Diagnosis not present

## 2023-10-29 DIAGNOSIS — D509 Iron deficiency anemia, unspecified: Secondary | ICD-10-CM

## 2023-10-29 DIAGNOSIS — I4819 Other persistent atrial fibrillation: Secondary | ICD-10-CM

## 2023-10-29 DIAGNOSIS — I10 Essential (primary) hypertension: Secondary | ICD-10-CM | POA: Diagnosis not present

## 2023-10-29 DIAGNOSIS — N179 Acute kidney failure, unspecified: Secondary | ICD-10-CM | POA: Diagnosis not present

## 2023-10-29 DIAGNOSIS — R531 Weakness: Secondary | ICD-10-CM | POA: Diagnosis not present

## 2023-10-29 DIAGNOSIS — J410 Simple chronic bronchitis: Secondary | ICD-10-CM | POA: Diagnosis not present

## 2023-10-29 DIAGNOSIS — F32A Depression, unspecified: Secondary | ICD-10-CM | POA: Diagnosis not present

## 2023-10-29 DIAGNOSIS — J9601 Acute respiratory failure with hypoxia: Secondary | ICD-10-CM | POA: Diagnosis not present

## 2023-10-29 DIAGNOSIS — J432 Centrilobular emphysema: Secondary | ICD-10-CM | POA: Diagnosis not present

## 2023-10-29 DIAGNOSIS — I4891 Unspecified atrial fibrillation: Secondary | ICD-10-CM | POA: Diagnosis not present

## 2023-10-29 DIAGNOSIS — A419 Sepsis, unspecified organism: Secondary | ICD-10-CM | POA: Diagnosis not present

## 2023-10-29 MED ORDER — FLUTICASONE PROPIONATE 50 MCG/ACT NA SUSP: 2.0000 | Freq: Every day | NASAL | 5 refills | Status: DC

## 2023-10-29 NOTE — Patient Instructions (Addendum)
 Thank you for coming to the office today.  Keep up with Dr Jacobo upcoming 11/11/23 I will ask him to address the anemia.  Remain smoke free  Incentive Spirometer  Please schedule a Follow-up Appointment to: Return if symptoms worsen or fail to improve.  If you have any other questions or concerns, please feel free to call the office or send a message through MyChart. You may also schedule an earlier appointment if necessary.  Additionally, you may be receiving a survey about your experience at our office within a few days to 1 week by e-mail or mail. We value your feedback.  Marsa Officer, DO [facility], [place]

## 2023-10-29 NOTE — Progress Notes (Unsigned)
 Subjective:    Patient ID: Elizabeth [name], female    DOB: 09-24-1950, 73 y.o.   MRN: [id number]  Nimrat Woolworth is a 73 y.o. female presenting on 10/29/2023 for Hospitalization Follow-up   HPI  HOSPITAL FOLLOW-UP VISIT  ***Quit smoking Night-time oxygen Not on daytime O2 dCHF IV Lasix  and Oral now 40mg  daily  ***HH PT   Hospital/Location: ***[facility] Date of Admission: *** Date of Discharge: *** Transitions of care telephone call: ***  Reason for Admission: *** Primary (+Secondary) Diagnosis: *** (***, ***)  - Hospital H&P and Discharge Summary have been reviewed - Patient presents today *** days *** after recent hospitalization. Brief summary of recent course, patient had symptoms of *** for ***, hospitalized, *** treated with ***. - Today reports overall has ***done well after discharge. Symptoms of *** ***have resolved ***are much improved ***seem to be worsening again. - New medications on discharge: *** - Changes to current meds on discharge: ***  I have reviewed the discharge medication list, and have reconciled the current and discharge medications today.   Current Outpatient Medications:    albuterol  (VENTOLIN  HFA) 108 (90 Base) MCG/ACT inhaler, Inhale 2 puffs into the lungs every 6 (six) hours as needed for wheezing or shortness of breath., Disp: 8 g, Rfl: 5   apixaban  (ELIQUIS ) 5 MG TABS tablet, Take 1 tablet (5 mg total) by mouth 2 (two) times daily., Disp: 60 tablet, Rfl: 0   ARIPiprazole  (ABILIFY ) 5 MG tablet, Take 1 tablet by mouth once daily, Disp: 90 tablet, Rfl: 1   busPIRone  (BUSPAR ) 5 MG tablet, Take 1 tablet (5 mg total) by mouth 3 (three) times daily., Disp: 90 tablet, Rfl: 0   cefUROXime  (CEFTIN ) 500 MG tablet, Take 1 tablet (500 mg total) by mouth 2 (two) times daily with a meal for 10 days., Disp: 20 tablet, Rfl: 0   cyanocobalamin  (VITAMIN B12) 1000 MCG/ML injection, Inject 1 mL (1,000 mcg total) into the muscle every 30 (thirty) days., Disp: 3 mL,  Rfl: 0   furosemide  (LASIX ) 40 MG tablet, Take 40 mg by mouth.  Take 1 tablet (40 mg total) by mouth once daily, Disp: , Rfl:    losartan  (COZAAR ) 50 MG tablet, Take 1 tablet by mouth once daily, Disp: 90 tablet, Rfl: 0   MAGNESIUM -OXIDE 400 (240 Mg) MG tablet, Take 1 tablet by mouth once daily, Disp: 90 tablet, Rfl: 0   metoprolol  succinate (TOPROL -XL) 25 MG 24 hr tablet, Take 1 tablet (25 mg total) by mouth daily. Take with or immediately following a meal., Disp: 90 tablet, Rfl: 1   Multiple Vitamin (MULTI-VITAMINS) TABS, Take by mouth., Disp: , Rfl:    Nebulizers (PARI PRONEB MAX LC SPRINT) MISC, , Disp: , Rfl:    omeprazole  (PRILOSEC) 20 MG capsule, TAKE 1 CAPSULE BY MOUTH ONCE DAILY BEFORE BREAKFAST, Disp: 90 capsule, Rfl: 0   potassium chloride  SA (KLOR-CON  M) 20 MEQ tablet, Take 1 tablet by mouth once daily, Disp: 90 tablet, Rfl: 0   rosuvastatin  (CRESTOR ) 40 MG tablet, Take 40 mg by mouth daily., Disp: , Rfl:    saccharomyces boulardii (FLORASTOR) 250 MG capsule, Take 1 capsule (250 mg total) by mouth 2 (two) times daily., Disp: 30 capsule, Rfl: 0   SPIRIVA  HANDIHALER 18 MCG inhalation capsule, Place 1 capsule (18 mcg total) into inhaler and inhale daily., Disp: 90 capsule, Rfl: 3   SYMBICORT 160-4.5 MCG/ACT inhaler, Inhale 2 puffs into the lungs., Disp: , Rfl:    venlafaxine  XR (EFFEXOR -XR) 75  MG 24 hr capsule, TAKE 3 CAPSULES BY MOUTH ONCE DAILY WITH BREAKFAST, Disp: 270 capsule, Rfl: 0   fluticasone  (FLONASE ) 50 MCG/ACT nasal spray, Place 2 sprays into both nostrils daily., Disp: 16 mL, Rfl: 5   RYBELSUS  3 MG TABS, Take 1 tablet (3 mg total) by mouth daily. (Patient not taking: Reported on 10/29/2023), Disp: , Rfl:   ------------------------------------------------------------------------- Social History   Tobacco Use   Smoking status: Every Day    Current packs/day: 0.50    Average packs/day: 0.5 packs/day for 52.0 years (26.0 ttl pk-yrs)    Types: Cigarettes   Smokeless tobacco:  Never   Tobacco comments:    5 ciggarette a day--03/02/2020  Vaping Use   Vaping status: Some Days  Substance Use Topics   Alcohol use: No   Drug use: No    Review of Systems Per HPI unless specifically indicated above     Objective:    BP 122/80 (BP Location: Left Arm, Patient Position: Sitting, Cuff Size: Normal)   Pulse (!) 57   Ht 5' 2 (1.575 m)   SpO2 93%   BMI 33.75 kg/m   Wt Readings from Last 3 Encounters:  10/18/23 184 lb 8.4 oz (83.7 kg)  10/11/23 184 lb 8 oz (83.7 kg)  10/09/23 182 lb (82.6 kg)    Physical Exam***  Results for orders placed or performed during the hospital encounter of 10/18/23  Blood Culture (routine x 2)   Collection Time: 10/18/23  6:00 AM   Specimen: BLOOD  Result Value Ref Range   Specimen Description BLOOD LEFT ANTECUBITAL    Special Requests      BOTTLES DRAWN AEROBIC AND ANAEROBIC Blood Culture results may not be optimal due to an inadequate volume of blood received in culture bottles   Culture      NO GROWTH 5 DAYS Performed at [facility], [address] [place], [place] [address]    Report Status 10/23/2023 FINAL   Blood Culture (routine x 2)   Collection Time: 10/18/23  6:00 AM   Specimen: BLOOD  Result Value Ref Range   Specimen Description BLOOD BLOOD LEFT HAND    Special Requests      BOTTLES DRAWN AEROBIC AND ANAEROBIC Blood Culture adequate volume   Culture      NO GROWTH 5 DAYS Performed at [facility], [address] [place], [place] [address]    Report Status 10/23/2023 FINAL   Lactic acid, plasma   Collection Time: 10/18/23  6:00 AM  Result Value Ref Range   Lactic Acid, Venous 7.9 (HH) 0.5 - 1.9 mmol/L  Comprehensive metabolic panel   Collection Time: 10/18/23  6:00 AM  Result Value Ref Range   Sodium 138 135 - 145 mmol/L   Potassium 4.7 3.5 - 5.1 mmol/L   Chloride 105 98 - 111 mmol/L   CO2 19 (L) 22 - 32 mmol/L   Glucose, Bld 238 (H) 70 - 99 mg/dL   BUN 17 8 - 23 mg/dL    Creatinine, Ser 8.96 (H) 0.44 - 1.00 mg/dL   Calcium  8.6 (L) 8.9 - 10.3 mg/dL   Total Protein 6.6 6.5 - 8.1 g/dL   Albumin 3.3 (L) 3.5 - 5.0 g/dL   AST 77 (H) 15 - 41 U/L   ALT 34 0 - 44 U/L   Alkaline Phosphatase 105 38 - 126 U/L   Total Bilirubin 0.6 0.0 - 1.2 mg/dL   GFR, Estimated 57 (L) >60 mL/min   Anion gap  14 5 - 15  Protime-INR   Collection Time: 10/18/23  6:00 AM  Result Value Ref Range   Prothrombin Time 14.0 11.4 - 15.2 seconds   INR 1.0 0.8 - 1.2  Brain natriuretic peptide   Collection Time: 10/18/23  6:00 AM  Result Value Ref Range   B Natriuretic Peptide 329.7 (H) 0.0 - 100.0 pg/mL  CBC with Differential/Platelet   Collection Time: 10/18/23  6:00 AM  Result Value Ref Range   WBC 22.7 (H) 4.0 - 10.5 K/uL   RBC 4.41 3.87 - 5.11 MIL/uL   Hemoglobin 12.0 12.0 - 15.0 g/dL   HCT 61.5 63.9 - 53.9 %   MCV 87.1 80.0 - 100.0 fL   MCH 27.2 26.0 - 34.0 pg   MCHC 31.3 30.0 - 36.0 g/dL   RDW 85.5 88.4 - 84.4 %   Platelets 379 150 - 400 K/uL   nRBC 0.0 0.0 - 0.2 %   Neutrophils Relative % 48 %   Neutro Abs 10.8 (H) 1.7 - 7.7 K/uL   Lymphocytes Relative 44 %   Lymphs Abs 9.9 (H) 0.7 - 4.0 K/uL   Monocytes Relative 6 %   Monocytes Absolute 1.4 (H) 0.1 - 1.0 K/uL   Eosinophils Relative 0 %   Eosinophils Absolute 0.0 0.0 - 0.5 K/uL   Basophils Relative 0 %   Basophils Absolute 0.1 0.0 - 0.1 K/uL   Smear Review Normal platelet morphology    Immature Granulocytes 2 %   Abs Immature Granulocytes 0.54 (H) 0.00 - 0.07 K/uL   Polychromasia PRESENT    Ovalocytes PRESENT   Pathologist smear review   Collection Time: 10/18/23  6:00 AM  Result Value Ref Range   Path Review Blood smear is reviewed.   Troponin I (High Sensitivity)   Collection Time: 10/18/23  6:00 AM  Result Value Ref Range   Troponin I (High Sensitivity) 29 (H) <18 ng/L  Resp panel by RT-PCR (RSV, Flu A&B, Covid) Anterior Nasal Swab   Collection Time: 10/18/23  6:08 AM   Specimen: Anterior Nasal Swab   Result Value Ref Range   SARS Coronavirus 2 by RT PCR NEGATIVE NEGATIVE   Influenza A by PCR NEGATIVE NEGATIVE   Influenza B by PCR NEGATIVE NEGATIVE   Resp Syncytial Virus by PCR NEGATIVE NEGATIVE  Blood gas, venous   Collection Time: 10/18/23  6:30 AM  Result Value Ref Range   FIO2 40 %   Delivery systems BILEVEL POSITIVE AIRWAY PRESSURE    pH, Ven 7.19 (LL) 7.25 - 7.43   pCO2, Ven 49 44 - 60 mmHg   pO2, Ven 57 (H) 32 - 45 mmHg   Bicarbonate 18.7 (L) 20.0 - 28.0 mmol/L   Acid-base deficit 9.6 (H) 0.0 - 2.0 mmol/L   O2 Saturation 81.7 %   Patient temperature 37.0    Collection site VEIN   Magnesium    Collection Time: 10/18/23  8:00 AM  Result Value Ref Range   Magnesium  2.9 (H) 1.7 - 2.4 mg/dL  Phosphorus   Collection Time: 10/18/23  8:00 AM  Result Value Ref Range   Phosphorus 6.8 (H) 2.5 - 4.6 mg/dL  Lactic acid, plasma   Collection Time: 10/18/23  8:19 AM  Result Value Ref Range   Lactic Acid, Venous 3.1 (HH) 0.5 - 1.9 mmol/L  Strep pneumoniae urinary antigen   Collection Time: 10/18/23  8:19 AM  Result Value Ref Range   Strep Pneumo Urinary Antigen NEGATIVE NEGATIVE  Legionella Pneumophila Serogp 1 Ur  Ag   Collection Time: 10/18/23  8:19 AM  Result Value Ref Range   L. pneumophila Serogp 1 Ur Ag Negative Negative   Source of Sample URINE, RANDOM   Hemoglobin A1c   Collection Time: 10/18/23  8:37 AM  Result Value Ref Range   Hgb A1c MFr Bld 6.7 (H) 4.8 - 5.6 %   Mean Plasma Glucose 145.59 mg/dL  CBG monitoring, ED   Collection Time: 10/18/23  8:46 AM  Result Value Ref Range   Glucose-Capillary 142 (H) 70 - 99 mg/dL  CBG monitoring, ED   Collection Time: 10/18/23 11:59 AM  Result Value Ref Range   Glucose-Capillary 158 (H) 70 - 99 mg/dL  Glucose, capillary   Collection Time: 10/18/23  5:49 PM  Result Value Ref Range   Glucose-Capillary 222 (H) 70 - 99 mg/dL  Expectorated Sputum Assessment w Gram Stain, Rflx to Resp Cult   Collection Time: 10/18/23  6:00  PM   Specimen: Sputum  Result Value Ref Range   Specimen Description SPUTUM    Special Requests  EXPSU    Sputum evaluation      THIS SPECIMEN IS ACCEPTABLE FOR SPUTUM CULTURE Performed at [facility], [address] [place], [place] [address]    Report Status 10/20/2023 FINAL   Glucose, capillary   Collection Time: 10/18/23  9:38 PM  Result Value Ref Range   Glucose-Capillary 143 (H) 70 - 99 mg/dL  Blood gas, venous   Collection Time: 10/18/23  9:51 PM  Result Value Ref Range   pH, Ven 7.35 7.25 - 7.43   pCO2, Ven 38 (L) 44 - 60 mmHg   pO2, Ven 57 (H) 32 - 45 mmHg   Bicarbonate 21.0 20.0 - 28.0 mmol/L   Acid-base deficit 4.2 (H) 0.0 - 2.0 mmol/L   O2 Saturation 88 %   Patient temperature 37.0    Collection site VEIN   Basic metabolic panel   Collection Time: 10/19/23  4:43 AM  Result Value Ref Range   Sodium 141 135 - 145 mmol/L   Potassium 4.7 3.5 - 5.1 mmol/L   Chloride 109 98 - 111 mmol/L   CO2 20 (L) 22 - 32 mmol/L   Glucose, Bld 153 (H) 70 - 99 mg/dL   BUN 25 (H) 8 - 23 mg/dL   Creatinine, Ser 8.89 (H) 0.44 - 1.00 mg/dL   Calcium  8.2 (L) 8.9 - 10.3 mg/dL   GFR, Estimated 53 (L) >60 mL/min   Anion gap 12 5 - 15  D-dimer, quantitative   Collection Time: 10/19/23  4:43 AM  Result Value Ref Range   D-Dimer, Quant 1.94 (H) 0.00 - 0.50 ug/mL-FEU  Magnesium    Collection Time: 10/19/23  4:43 AM  Result Value Ref Range   Magnesium  2.7 (H) 1.7 - 2.4 mg/dL  Blood gas, arterial   Collection Time: 10/19/23  4:53 AM  Result Value Ref Range   FIO2 100 %   Delivery systems NON-REBREATHER OXYGEN MASK    pH, Arterial 7.31 (L) 7.35 - 7.45   pCO2 arterial 40 32 - 48 mmHg   pO2, Arterial 205 (H) 83 - 108 mmHg   Bicarbonate 20.1 20.0 - 28.0 mmol/L   Acid-base deficit 5.8 (H) 0.0 - 2.0 mmol/L   O2 Saturation 99.6 %   Patient temperature 37.0    Collection site LEFT RADIAL    Allens test (pass/fail) PASS PASS  CBC with Differential/Platelet   Collection Time:  10/19/23  6:26 AM  Result Value Ref  Range   WBC 25.5 (H) 4.0 - 10.5 K/uL   RBC 3.96 3.87 - 5.11 MIL/uL   Hemoglobin 10.8 (L) 12.0 - 15.0 g/dL   HCT 66.6 (L) 63.9 - 53.9 %   MCV 84.1 80.0 - 100.0 fL   MCH 27.3 26.0 - 34.0 pg   MCHC 32.4 30.0 - 36.0 g/dL   RDW 85.2 88.4 - 84.4 %   Platelets 295 150 - 400 K/uL   nRBC 0.0 0.0 - 0.2 %   Neutrophils Relative % 81 %   Neutro Abs 20.8 (H) 1.7 - 7.7 K/uL   Lymphocytes Relative 10 %   Lymphs Abs 2.5 0.7 - 4.0 K/uL   Monocytes Relative 8 %   Monocytes Absolute 2.0 (H) 0.1 - 1.0 K/uL   Eosinophils Relative 0 %   Eosinophils Absolute 0.0 0.0 - 0.5 K/uL   Basophils Relative 0 %   Basophils Absolute 0.0 0.0 - 0.1 K/uL   WBC Morphology MORPHOLOGY UNREMARKABLE    RBC Morphology MORPHOLOGY UNREMARKABLE    Smear Review Normal platelet morphology    Immature Granulocytes 1 %   Abs Immature Granulocytes 0.21 (H) 0.00 - 0.07 K/uL  Glucose, capillary   Collection Time: 10/19/23  7:33 AM  Result Value Ref Range   Glucose-Capillary 107 (H) 70 - 99 mg/dL  Glucose, capillary   Collection Time: 10/19/23 12:11 PM  Result Value Ref Range   Glucose-Capillary 180 (H) 70 - 99 mg/dL  ECHOCARDIOGRAM COMPLETE   Collection Time: 10/19/23  4:05 PM  Result Value Ref Range   Weight 2,952.4 oz   Height 62 in   BP 106/67 mmHg   Ao pk vel 1.42 m/s   AV Area VTI 1.94 cm2   AR max vel 1.70 cm2   AV Mean grad 3.0 mmHg   AV Peak grad 8.1 mmHg   Single Plane A2C EF 50.2 %   Single Plane A4C EF 55.6 %   Calc EF 52.1 %   S' Lateral 3.00 cm   AV Area mean vel 1.87 cm2   Area-P 1/2 5.70 cm2   MV VTI 1.92 cm2   Est EF 50 - 55%   Glucose, capillary   Collection Time: 10/19/23  4:22 PM  Result Value Ref Range   Glucose-Capillary 169 (H) 70 - 99 mg/dL  Lactic acid, plasma   Collection Time: 10/19/23  5:08 PM  Result Value Ref Range   Lactic Acid, Venous 2.4 (HH) 0.5 - 1.9 mmol/L  Procalcitonin   Collection Time: 10/19/23  5:08 PM  Result Value Ref Range    Procalcitonin 0.23 ng/mL  Troponin I (High Sensitivity)   Collection Time: 10/19/23  5:08 PM  Result Value Ref Range   Troponin I (High Sensitivity) 332 (HH) <18 ng/L  Troponin I (High Sensitivity)   Collection Time: 10/19/23  7:52 PM  Result Value Ref Range   Troponin I (High Sensitivity) 306 (HH) <18 ng/L  Glucose, capillary   Collection Time: 10/19/23  9:06 PM  Result Value Ref Range   Glucose-Capillary 128 (H) 70 - 99 mg/dL  Troponin I (High Sensitivity)   Collection Time: 10/19/23 10:03 PM  Result Value Ref Range   Troponin I (High Sensitivity) 288 (HH) <18 ng/L  CBC with Differential/Platelet   Collection Time: 10/20/23  5:00 AM  Result Value Ref Range   WBC 15.4 (H) 4.0 - 10.5 K/uL   RBC 3.93 3.87 - 5.11 MIL/uL   Hemoglobin 10.7 (L) 12.0 - 15.0 g/dL   HCT  32.9 (L) 36.0 - 46.0 %   MCV 83.7 80.0 - 100.0 fL   MCH 27.2 26.0 - 34.0 pg   MCHC 32.5 30.0 - 36.0 g/dL   RDW 85.2 88.4 - 84.4 %   Platelets 249 150 - 400 K/uL   nRBC 0.0 0.0 - 0.2 %   Neutrophils Relative % 83 %   Neutro Abs 12.8 (H) 1.7 - 7.7 K/uL   Lymphocytes Relative 10 %   Lymphs Abs 1.5 0.7 - 4.0 K/uL   Monocytes Relative 6 %   Monocytes Absolute 0.9 0.1 - 1.0 K/uL   Eosinophils Relative 0 %   Eosinophils Absolute 0.0 0.0 - 0.5 K/uL   Basophils Relative 0 %   Basophils Absolute 0.0 0.0 - 0.1 K/uL   Immature Granulocytes 1 %   Abs Immature Granulocytes 0.10 (H) 0.00 - 0.07 K/uL  Basic metabolic panel with GFR   Collection Time: 10/20/23  5:00 AM  Result Value Ref Range   Sodium 141 135 - 145 mmol/L   Potassium 3.8 3.5 - 5.1 mmol/L   Chloride 105 98 - 111 mmol/L   CO2 25 22 - 32 mmol/L   Glucose, Bld 128 (H) 70 - 99 mg/dL   BUN 33 (H) 8 - 23 mg/dL   Creatinine, Ser 8.76 (H) 0.44 - 1.00 mg/dL   Calcium  8.2 (L) 8.9 - 10.3 mg/dL   GFR, Estimated 46 (L) >60 mL/min   Anion gap 11 5 - 15  Glucose, capillary   Collection Time: 10/20/23  8:55 AM  Result Value Ref Range   Glucose-Capillary 106 (H) 70 -  99 mg/dL  MRSA Next Gen by PCR, Nasal   Collection Time: 10/20/23 10:32 AM   Specimen: Nasal Mucosa; Nasal Swab  Result Value Ref Range   MRSA by PCR Next Gen NOT DETECTED NOT DETECTED  Glucose, capillary   Collection Time: 10/20/23 12:42 PM  Result Value Ref Range   Glucose-Capillary 183 (H) 70 - 99 mg/dL  Glucose, capillary   Collection Time: 10/20/23  4:01 PM  Result Value Ref Range   Glucose-Capillary 87 70 - 99 mg/dL  Culture, Respiratory w Gram Stain   Collection Time: 10/20/23  6:00 PM   Specimen: SPU  Result Value Ref Range   Specimen Description      SPUTUM Performed at [facility], [address] [place], [place] [address]    Special Requests       EXPSU Reflexed from [id number] Performed at [facility], [address] [place], [place] [address]    Gram Stain      RARE WBC PRESENT, PREDOMINANTLY PMN RARE BUDDING YEAST SEEN Performed at [facility], [address] [place], [place] [address]    Culture MODERATE CANDIDA ALBICANS    Report Status 10/23/2023 FINAL   Glucose, capillary   Collection Time: 10/20/23 10:00 PM  Result Value Ref Range   Glucose-Capillary 114 (H) 70 - 99 mg/dL  CBC with Differential/Platelet   Collection Time: 10/21/23  5:48 AM  Result Value Ref Range   WBC 10.5 4.0 - 10.5 K/uL   RBC 3.76 (L) 3.87 - 5.11 MIL/uL   Hemoglobin 10.1 (L) 12.0 - 15.0 g/dL   HCT 67.7 (L) 63.9 - 53.9 %   MCV 85.6 80.0 - 100.0 fL   MCH 26.9 26.0 - 34.0 pg   MCHC 31.4 30.0 - 36.0 g/dL   RDW 85.3 88.4 - 84.4 %   Platelets 212 150 - 400 K/uL  nRBC 0.0 0.0 - 0.2 %   Neutrophils Relative % 65 %   Neutro Abs 6.8 1.7 - 7.7 K/uL   Lymphocytes Relative 26 %   Lymphs Abs 2.7 0.7 - 4.0 K/uL   Monocytes Relative 8 %   Monocytes Absolute 0.9 0.1 - 1.0 K/uL   Eosinophils Relative 0 %   Eosinophils Absolute 0.0 0.0 - 0.5 K/uL   Basophils Relative 0 %   Basophils Absolute 0.0 0.0 - 0.1 K/uL   Immature Granulocytes 1 %   Abs Immature  Granulocytes 0.05 0.00 - 0.07 K/uL  Basic metabolic panel with GFR   Collection Time: 10/21/23  5:48 AM  Result Value Ref Range   Sodium 139 135 - 145 mmol/L   Potassium 3.5 3.5 - 5.1 mmol/L   Chloride 101 98 - 111 mmol/L   CO2 26 22 - 32 mmol/L   Glucose, Bld 76 70 - 99 mg/dL   BUN 26 (H) 8 - 23 mg/dL   Creatinine, Ser 8.96 (H) 0.44 - 1.00 mg/dL   Calcium  8.3 (L) 8.9 - 10.3 mg/dL   GFR, Estimated 57 (L) >60 mL/min   Anion gap 12 5 - 15  Glucose, capillary   Collection Time: 10/21/23  8:34 AM  Result Value Ref Range   Glucose-Capillary 89 70 - 99 mg/dL  Glucose, capillary   Collection Time: 10/21/23 12:31 PM  Result Value Ref Range   Glucose-Capillary 104 (H) 70 - 99 mg/dL  Glucose, capillary   Collection Time: 10/21/23  5:01 PM  Result Value Ref Range   Glucose-Capillary 163 (H) 70 - 99 mg/dL  Glucose, capillary   Collection Time: 10/21/23  8:42 PM  Result Value Ref Range   Glucose-Capillary 148 (H) 70 - 99 mg/dL  CBC with Differential/Platelet   Collection Time: 10/22/23  5:36 AM  Result Value Ref Range   WBC 9.5 4.0 - 10.5 K/uL   RBC 3.74 (L) 3.87 - 5.11 MIL/uL   Hemoglobin 10.0 (L) 12.0 - 15.0 g/dL   HCT 68.1 (L) 63.9 - 53.9 %   MCV 85.0 80.0 - 100.0 fL   MCH 26.7 26.0 - 34.0 pg   MCHC 31.4 30.0 - 36.0 g/dL   RDW 85.6 88.4 - 84.4 %   Platelets 210 150 - 400 K/uL   nRBC 0.0 0.0 - 0.2 %   Neutrophils Relative % 69 %   Neutro Abs 6.5 1.7 - 7.7 K/uL   Lymphocytes Relative 23 %   Lymphs Abs 2.2 0.7 - 4.0 K/uL   Monocytes Relative 7 %   Monocytes Absolute 0.7 0.1 - 1.0 K/uL   Eosinophils Relative 0 %   Eosinophils Absolute 0.0 0.0 - 0.5 K/uL   Basophils Relative 0 %   Basophils Absolute 0.0 0.0 - 0.1 K/uL   Immature Granulocytes 1 %   Abs Immature Granulocytes 0.05 0.00 - 0.07 K/uL  Basic metabolic panel with GFR   Collection Time: 10/22/23  5:36 AM  Result Value Ref Range   Sodium 140 135 - 145 mmol/L   Potassium 3.4 (L) 3.5 - 5.1 mmol/L   Chloride 103 98 -  111 mmol/L   CO2 27 22 - 32 mmol/L   Glucose, Bld 123 (H) 70 - 99 mg/dL   BUN 18 8 - 23 mg/dL   Creatinine, Ser 9.15 0.44 - 1.00 mg/dL   Calcium  8.4 (L) 8.9 - 10.3 mg/dL   GFR, Estimated >39 >39 mL/min   Anion gap 10 5 - 15  Glucose, capillary  Collection Time: 10/22/23  8:27 AM  Result Value Ref Range   Glucose-Capillary 132 (H) 70 - 99 mg/dL      Assessment & Plan:   Problem List Items Addressed This Visit     Iron deficiency anemia   Relevant Medications   fluticasone  (FLONASE ) 50 MCG/ACT nasal spray   Other Visit Diagnoses       Microcytic anemia       Relevant Medications   fluticasone  (FLONASE ) 50 MCG/ACT nasal spray      ***FYI Dr Jacobo - she has low Hemoglobin 10 range, and has had normal Iron panel 10/09/2023  ***VBCI   Meds ordered this encounter  Medications   fluticasone  (FLONASE ) 50 MCG/ACT nasal spray    Sig: Place 2 sprays into both nostrils daily.    Dispense:  16 mL    Refill:  5    Follow up plan: Return if symptoms worsen or fail to improve.   Marsa Officer, DO [facility] 10/29/2023, 11:56 AM

## 2023-10-30 ENCOUNTER — Other Ambulatory Visit: Payer: Self-pay

## 2023-10-30 ENCOUNTER — Ambulatory Visit

## 2023-10-30 ENCOUNTER — Encounter (HOSPITAL_COMMUNITY): Payer: Self-pay

## 2023-10-30 DIAGNOSIS — J9601 Acute respiratory failure with hypoxia: Secondary | ICD-10-CM | POA: Diagnosis not present

## 2023-10-30 DIAGNOSIS — J189 Pneumonia, unspecified organism: Secondary | ICD-10-CM | POA: Diagnosis not present

## 2023-10-30 DIAGNOSIS — I4891 Unspecified atrial fibrillation: Secondary | ICD-10-CM | POA: Diagnosis not present

## 2023-10-30 DIAGNOSIS — A419 Sepsis, unspecified organism: Secondary | ICD-10-CM | POA: Diagnosis not present

## 2023-10-30 DIAGNOSIS — F32A Depression, unspecified: Secondary | ICD-10-CM | POA: Diagnosis not present

## 2023-10-30 DIAGNOSIS — E119 Type 2 diabetes mellitus without complications: Secondary | ICD-10-CM | POA: Diagnosis not present

## 2023-10-30 DIAGNOSIS — J432 Centrilobular emphysema: Secondary | ICD-10-CM | POA: Diagnosis not present

## 2023-10-30 DIAGNOSIS — I10 Essential (primary) hypertension: Secondary | ICD-10-CM | POA: Diagnosis not present

## 2023-10-30 DIAGNOSIS — N179 Acute kidney failure, unspecified: Secondary | ICD-10-CM | POA: Diagnosis not present

## 2023-10-30 NOTE — Transitions of Care (Post Inpatient/ED Visit) (Signed)
 Transition of Care week 2  Visit Note  10/30/2023  Name: Elizabeth [name] MRN: [id number]          DOB: 1951/04/02  Situation: Patient enrolled in [facility] 30-day program. Visit completed with patient by telephone.   Background:   Initial Transition Care Management Follow-up Telephone Call    Past Medical History:  Diagnosis Date   Allergy    Anxiety    B12 deficiency    Carpal tunnel syndrome    Centrilobular emphysema (HCC)    COPD (chronic obstructive pulmonary disease) (HCC)    Depression    Genetic testing    Common Cancers panel (47 genes) @ [facility] - No pathogenic mutations detected   HLD (hyperlipidemia)    Hyperlipidemia    Hypertension    Iron deficiency anemia 10/16/2016   Lipoma of skin    Lung nodule    Normocytic anemia 10/16/2016   QT prolongation    Substance abuse (HCC)     Assessment: Patient Reported Symptoms: Cognitive Cognitive Status: No symptoms reported, Alert and oriented to person, place, and time, Normal speech and language skills, Able to follow simple commands      Neurological Neurological Review of Symptoms: No symptoms reported    HEENT HEENT Symptoms Reported: No symptoms reported      Cardiovascular Cardiovascular Symptoms Reported: No symptoms reported Does patient have uncontrolled Hypertension?: No Cardiovascular Management Strategies: Medical device, Medication therapy Weight: 182 lb (82.6 kg) Cardiovascular Self-Management Outcome: 4 (good)  Respiratory Respiratory Symptoms Reported: Shortness of breath Other Respiratory Symptoms: Patient states she is back to baseline shortness of breath using home oxygen and CPAP. Respiratory Management Strategies: Adequate rest, Activity, Coping strategies, CPAP, Medication therapy, Oxygen therapy, Routine screening Respiratory Self-Management Outcome: 3 (uncertain)  Endocrine Endocrine Symptoms Reported: No symptoms reported    Gastrointestinal Gastrointestinal Symptoms Reported: No  symptoms reported      Genitourinary Genitourinary Symptoms Reported: Urgency    Integumentary Integumentary Symptoms Reported: No symptoms reported    Musculoskeletal Musculoskelatal Symptoms Reviewed: Weakness Additional Musculoskeletal Details: Patient states she does not feel her weakness is muscular but she feels weak and tired still Musculoskeletal Management Strategies: Adequate rest, Activity, Medication therapy, Routine screening, Coping strategies, Exercise (PT at home via home health.) Musculoskeletal Self-Management Outcome: 3 (uncertain) Falls in the past year?: No Patient at Risk for Falls Due to: Other (Comment), Medication side effect (DME home oxygen) Fall risk Follow up: Falls prevention discussed  Psychosocial Psychosocial Symptoms Reported: Anxiety - if selected complete GAD, Hyperviligence Additional Psychological Details: Patien tstates she feels her anxiety is related to being afraid the episode of not being able to breath that brough her to hospital will happen again. Spoke to PCP on 10/29/23 about this. Is on medication, and RNCM offered referral but declined at this time. Behavioral Management Strategies: Activity, Adequate rest, Coping strategies, Medication therapy, Support system Behavioral Health Self-Management Outcome: 3 (uncertain) Major Change/Loss/Stressor/Fears (CP): Medical condition, self Quality of Family Relationships: supportive Do you feel physically threatened by others?: No   Vitals:   10/30/23 1402  SpO2: 97%    Medications Reviewed Today     Reviewed by Carolee Heron NOVAK, RN (Case Manager) on 10/30/23 at 1355  Med List Status: <None>   Medication Order Taking? Sig Documenting Provider Last Dose Status Informant  albuterol  (VENTOLIN  HFA) 108 (90 Base) MCG/ACT inhaler [id number] Yes Inhale 2 puffs into the lungs every 6 (six) hours as needed for wheezing or shortness of breath. Edman Marsa PARAS, DO  Active Child  apixaban  (ELIQUIS ) 5 MG  TABS tablet [id number] Yes Take 1 tablet (5 mg total) by mouth 2 (two) times daily. Vivienne Nest M, PA-C  Active   ARIPiprazole  (ABILIFY ) 5 MG tablet [id number] Yes Take 1 tablet by mouth once daily Edman Marsa PARAS, DO  Active Child  busPIRone  (BUSPAR ) 5 MG tablet [id number] Yes Take 1 tablet (5 mg total) by mouth 3 (three) times daily. Vivienne Nest M, PA-C  Active   cefUROXime  (CEFTIN ) 500 MG tablet [id number] Yes Take 1 tablet (500 mg total) by mouth 2 (two) times daily with a meal for 10 days. Kingston Robes, PA-C  Active   cyanocobalamin  (VITAMIN B12) 1000 MCG/ML injection [id number] Yes Inject 1 mL (1,000 mcg total) into the muscle every 30 (thirty) days. Kingston Robes, PA-C  Active Child  fluticasone  (FLONASE ) 50 MCG/ACT nasal spray [id number] Yes Place 2 sprays into both nostrils daily. Edman Marsa PARAS, DO  Active   furosemide  (LASIX ) 40 MG tablet [id number] Yes Take 40 mg by mouth.  Take 1 tablet (40 mg total) by mouth once daily [provider]  Active Child  losartan  (COZAAR ) 50 MG tablet [id number] Yes Take 1 tablet by mouth once daily Karamalegos, Marsa PARAS, DO  Active Child  MAGNESIUM -OXIDE 400 (240 Mg) MG tablet [id number] Yes Take 1 tablet by mouth once daily Karamalegos, Marsa PARAS, DO  Active Child  metoprolol  succinate (TOPROL -XL) 25 MG 24 hr tablet [id number] Yes Take 1 tablet (25 mg total) by mouth daily. Take with or immediately following a meal. Edman, Marsa PARAS, DO  Active Child  Multiple Vitamin (MULTI-VITAMINS) TABS [id number] Yes Take by mouth. [provider]  Active Child  Nebulizers (PARI PRONEB MAX LC SPRINT) MISC [id number] Yes  [provider]  Active Child  omeprazole  (PRILOSEC) 20 MG capsule [id number] Yes TAKE 1 CAPSULE BY MOUTH ONCE DAILY BEFORE BREAKFAST Karamalegos, Marsa PARAS, DO  Active Child  potassium chloride  SA (KLOR-CON  M) 20 MEQ tablet [id number] Yes Take 1 tablet by mouth once daily Edman Marsa PARAS, DO  Active Child  rosuvastatin  (CRESTOR ) 40 MG tablet [id number] Yes Take 40 mg by mouth daily. [provider]  Active Child  RYBELSUS  3 MG TABS [id number]  Take 1 tablet (3 mg total) by mouth daily.  Patient not taking: Reported on 10/30/2023   Edman Marsa PARAS, DO  Active Child           Med Note [facility], ELIZABETH A   Fri Oct 18, 2023  7:23 AM) NOT STARTED  saccharomyces boulardii (FLORASTOR) 250 MG capsule [id number] Yes Take 1 capsule (250 mg total) by mouth 2 (two) times daily. Vivienne Nest HERO, [place]  Active   SPIRIVA  HANDIHALER 18 MCG inhalation capsule [id number] Yes Place 1 capsule (18 mcg total) into inhaler and inhale daily. Edman Marsa PARAS, DO  Active Child  SYMBICORT 160-4.5 MCG/ACT inhaler [id number] Yes Inhale 2 puffs into the lungs. [provider]  Active Child  venlafaxine  XR (EFFEXOR -XR) 75 MG 24 hr capsule [id number] Yes TAKE 3 CAPSULES BY MOUTH ONCE DAILY WITH BREAKFAST Karamalegos, Marsa PARAS, DO  Active             Goals Addressed             This Visit's Progress    VBCI Transitions of Care (TOC) Care Plan       (All reviewed and or updated on 10/29/23 on telephonic call with patient).   Problems:  Recent Hospitalization for treatment of Pulmonary  Disease Knowledge Deficit Related to Respiratory Failure/pneumonia  Goal:  Over the next 30 days, the patient will not experience hospital readmission  Interventions:  Transitions of Care:  No changes to SDOH or Allergy Reviews.  Doctor Visits  - discussed the importance of doctor visits Completed PCP follow up on 10/29/23. One medication addition Incentive spirometry at home;reviewed with patient.  Troubleshoot use of DME in the home Fall prevention around oxygen tubing.  Post discharge activity limitations prescribed by provider reviewed DME HOME oxygen via [facility]:  Discussed oxygen safety/fire safety and to let local fire station know of oxygen use at  home.  Has not yet done this but verbalized understanding of why and states will do so or have daughter do so.  Reviewed Discharge instructions patient did not receive at discharge, medications review, SDOH, allergies, functional status.  Reviewed PCP visit and all of the above again this call.  Wishes to have a pulmonary provider closer to home, now at [facility], encouraged to ask PCP for referral.  Home Health has been coming via [facility] and has 4 visits since last call To speak to PT about her fatigue/tiredness not related to shortness of breath or other support.  Patient Self Care Activities:  Attend all scheduled provider appointments Call pharmacy for medication refills 3-7 days in advance of running out of medications Call provider office for new concerns or questions  Notify RN Care Manager of TOC call rescheduling needs Participate in Transition of Care Program/Attend TOC scheduled calls Perform all self care activities independently  Take medications as prescribed   Consider referral to LCSW if anxiety does not decrease, to reassess next call or speak to PCP again about this (Spoke to PCP on 10/29/23).  Consider some relaxation techniques.  Patient states more knowledge about her condition and what to call provider for has helped some with anxiety Family has a rescue plan in place.  Continue to monitor SpO2, weights, blood pressure, blood sugars at home.  Keep log to take to provider Include spirometry readings and progression.  Contact PCP regarding pulmonary referral closer to home.   Plan:  Continue with current plan.  Telephonic appointment scheduled with patient for next week at 130 pm          Patient and daughter understand how to contact benefit provider if needed.   Recommendation:   Continue Current Plan of Care Request referral from PCP for specialist closer to home.   Follow Up Plan:   Telephone follow-up in 1 week 11/07/23 at 2 pm   Bing Edison MSN, RN RN  Case Manager [facility]  VBCI-Population Health Office Hours M-F [phone] Direct Dial: [phone] Main Phone [phone]  Fax: [phone] [email or web address]

## 2023-10-30 NOTE — Patient Instructions (Signed)
 Visit Information  Thank you for taking time to visit with me today. Please don't hesitate to contact me if I can be of assistance to you before our next scheduled telephone appointment.  Our next appointment is by telephone on 11/07/23 at 2 pm  Following is a copy of your care plan:   Goals Addressed             This Visit's Progress    VBCI Transitions of Care (TOC) Care Plan       (All reviewed and or updated on 10/29/23 on telephonic call with patient).   Problems:  Recent Hospitalization for treatment of Pulmonary Disease Knowledge Deficit Related to Respiratory Failure/pneumonia  Goal:  Over the next 30 days, the patient will not experience hospital readmission  Interventions:  Transitions of Care:  No changes to SDOH or Allergy Reviews.  Doctor Visits  - discussed the importance of doctor visits Completed PCP follow up on 10/29/23. One medication addition Incentive spirometry at home;reviewed with patient.  Troubleshoot use of DME in the home Fall prevention around oxygen tubing.  Post discharge activity limitations prescribed by provider reviewed DME HOME oxygen via [facility]:  Discussed oxygen safety/fire safety and to let local fire station know of oxygen use at home.  Has not yet done this but verbalized understanding of why and states will do so or have daughter do so.  Reviewed Discharge instructions patient did not receive at discharge, medications review, SDOH, allergies, functional status.  Reviewed PCP visit and all of the above again this call.  Wishes to have a pulmonary provider closer to home, now at [facility], encouraged to ask PCP for referral.  Home Health has been coming via [facility] and has 4 visits since last call To speak to PT about her fatigue/tiredness not related to shortness of breath or other support.  Patient Self Care Activities:  Attend all scheduled provider appointments Call pharmacy for medication refills 3-7 days in advance of running out  of medications Call provider office for new concerns or questions  Notify RN Care Manager of TOC call rescheduling needs Participate in Transition of Care Program/Attend TOC scheduled calls Perform all self care activities independently  Take medications as prescribed   Consider referral to LCSW if anxiety does not decrease, to reassess next call or speak to PCP again about this (Spoke to PCP on 10/29/23).  Consider some relaxation techniques.  Patient states more knowledge about her condition and what to call provider for has helped some with anxiety Family has a rescue plan in place.  Continue to monitor SpO2, weights, blood pressure, blood sugars at home.  Keep log to take to provider Include spirometry readings and progression.  Contact PCP regarding pulmonary referral closer to home.   Plan:  Continue with current plan.  Telephonic appointment scheduled with patient for next week at 130 pm          Patient verbalizes understanding of instructions and care plan provided today and agrees to view in MyChart. Active MyChart status and patient understanding of how to access instructions and care plan via MyChart confirmed with patient.     Telephone follow up appointment with care management team member scheduled for:11/07/23 at 2 pm with Bing Edison RN CM   Please call the care guide team at [phone] if you need to cancel or reschedule your appointment.   Please call the Suicide and Crisis Lifeline: 988 call the [place]  National Suicide Prevention Lifeline: 364-704-3569 or TTY: (778)308-0850  TTY 708-186-4874) to talk to a trained counselor call 911 if you are experiencing a Mental Health or Behavioral Health Crisis or need someone to talk to.   Bing Edison MSN, RN RN Case Sales executive Health  VBCI-Population Health Office Hours M-F [phone] Direct Dial: [phone] Main Phone [phone]  Fax: [phone] [email or web address]

## 2023-11-01 ENCOUNTER — Telehealth: Payer: Self-pay

## 2023-11-01 DIAGNOSIS — I4891 Unspecified atrial fibrillation: Secondary | ICD-10-CM | POA: Diagnosis not present

## 2023-11-01 NOTE — Progress Notes (Signed)
 Complex Care Management Note  Care Guide Note 11/01/2023 Name: Geral Tuch MRN: [id number] DOB: 1950-09-26  Marlena Barbato is a 73 y.o. year old female who sees Edman Marsa PARAS, DO for primary care. I reached out to [name] by phone today to offer complex care management services.  Ms. Vanburen was given information about Complex Care Management services today including:   The Complex Care Management services include support from the care team which includes your Nurse Care Manager, Clinical Social Worker, or Pharmacist.  The Complex Care Management team is here to help remove barriers to the health concerns and goals most important to you. Complex Care Management services are voluntary, and the patient may decline or stop services at any time by request to their care team member.   Complex Care Management Consent Status: Patient agreed to services and verbal consent obtained.   Follow up plan:  Telephone appointment with complex care management team member scheduled for:  [facility] 11/29/2023 LCSW 11/25/2023  Encounter Outcome:  Patient Scheduled  Jeoffrey Buffalo , RMA     [place]  [facility] Guide  Direct Dial: [phone]  Website: [email or web address]

## 2023-11-04 ENCOUNTER — Ambulatory Visit

## 2023-11-05 ENCOUNTER — Ambulatory Visit (INDEPENDENT_AMBULATORY_CARE_PROVIDER_SITE_OTHER)

## 2023-11-05 ENCOUNTER — Telehealth: Admitting: Physician Assistant

## 2023-11-05 ENCOUNTER — Encounter: Payer: Self-pay | Admitting: Emergency Medicine

## 2023-11-05 ENCOUNTER — Ambulatory Visit
Admission: EM | Admit: 2023-11-05 | Discharge: 2023-11-05 | Disposition: A | Discharge status: Home / Self Care | Attending: Emergency Medicine | Admitting: Emergency Medicine

## 2023-11-05 DIAGNOSIS — J069 Acute upper respiratory infection, unspecified: Secondary | ICD-10-CM | POA: Diagnosis not present

## 2023-11-05 DIAGNOSIS — A419 Sepsis, unspecified organism: Secondary | ICD-10-CM | POA: Diagnosis not present

## 2023-11-05 DIAGNOSIS — J432 Centrilobular emphysema: Secondary | ICD-10-CM | POA: Diagnosis not present

## 2023-11-05 DIAGNOSIS — R0602 Shortness of breath: Secondary | ICD-10-CM

## 2023-11-05 DIAGNOSIS — J449 Chronic obstructive pulmonary disease, unspecified: Secondary | ICD-10-CM | POA: Diagnosis not present

## 2023-11-05 DIAGNOSIS — J441 Chronic obstructive pulmonary disease with (acute) exacerbation: Secondary | ICD-10-CM | POA: Diagnosis not present

## 2023-11-05 DIAGNOSIS — J189 Pneumonia, unspecified organism: Secondary | ICD-10-CM | POA: Diagnosis not present

## 2023-11-05 DIAGNOSIS — I10 Essential (primary) hypertension: Secondary | ICD-10-CM | POA: Diagnosis not present

## 2023-11-05 DIAGNOSIS — R5381 Other malaise: Secondary | ICD-10-CM

## 2023-11-05 DIAGNOSIS — N179 Acute kidney failure, unspecified: Secondary | ICD-10-CM | POA: Diagnosis not present

## 2023-11-05 DIAGNOSIS — F32A Depression, unspecified: Secondary | ICD-10-CM | POA: Diagnosis not present

## 2023-11-05 DIAGNOSIS — J9601 Acute respiratory failure with hypoxia: Secondary | ICD-10-CM | POA: Diagnosis not present

## 2023-11-05 DIAGNOSIS — I4891 Unspecified atrial fibrillation: Secondary | ICD-10-CM | POA: Diagnosis not present

## 2023-11-05 DIAGNOSIS — R519 Headache, unspecified: Secondary | ICD-10-CM

## 2023-11-05 DIAGNOSIS — E119 Type 2 diabetes mellitus without complications: Secondary | ICD-10-CM | POA: Diagnosis not present

## 2023-11-05 LAB — CBC WITH DIFFERENTIAL/PLATELET
Abs Immature Granulocytes: 0.02 K/uL (ref 0.00–0.07)
Basophils Absolute: 0 K/uL (ref 0.0–0.1)
Basophils Relative: 0 %
Eosinophils Absolute: 0 K/uL (ref 0.0–0.5)
Eosinophils Relative: 0 %
HCT: 32.8 % — ABNORMAL LOW (ref 36.0–46.0)
Hemoglobin: 10.5 g/dL — ABNORMAL LOW (ref 12.0–15.0)
Immature Granulocytes: 0 %
Lymphocytes Relative: 24 %
Lymphs Abs: 1.5 K/uL (ref 0.7–4.0)
MCH: 26.4 pg (ref 26.0–34.0)
MCHC: 32 g/dL (ref 30.0–36.0)
MCV: 82.6 fL (ref 80.0–100.0)
Monocytes Absolute: 0.5 K/uL (ref 0.1–1.0)
Monocytes Relative: 9 %
Neutro Abs: 4.1 K/uL (ref 1.7–7.7)
Neutrophils Relative %: 67 %
Platelets: 232 K/uL (ref 150–400)
RBC: 3.97 MIL/uL (ref 3.87–5.11)
RDW: 14.4 % (ref 11.5–15.5)
WBC: 6.2 K/uL (ref 4.0–10.5)
nRBC: 0 % (ref 0.0–0.2)

## 2023-11-05 LAB — COMPREHENSIVE METABOLIC PANEL WITH GFR
ALT: 22 U/L (ref 0–44)
AST: 39 U/L (ref 15–41)
Albumin: 3.6 g/dL (ref 3.5–5.0)
Alkaline Phosphatase: 98 U/L (ref 38–126)
Anion gap: 11 (ref 5–15)
BUN: 9 mg/dL (ref 8–23)
CO2: 27 mmol/L (ref 22–32)
Calcium: 8.7 mg/dL — ABNORMAL LOW (ref 8.9–10.3)
Chloride: 101 mmol/L (ref 98–111)
Creatinine, Ser: 0.71 mg/dL (ref 0.44–1.00)
GFR, Estimated: >60 mL/min (ref >60)
Glucose, Bld: 134 mg/dL — ABNORMAL HIGH (ref 70–99)
Potassium: 3.5 mmol/L (ref 3.5–5.1)
Sodium: 139 mmol/L (ref 135–145)
Total Bilirubin: 0.9 mg/dL (ref 0.0–1.2)
Total Protein: 6.9 g/dL (ref 6.5–8.1)

## 2023-11-05 MED ORDER — PREDNISONE 20 MG PO TABS
60.0000 mg | ORAL_TABLET | Freq: Every day | ORAL | 0 refills | Status: AC
Start: 2023-11-05 — End: 2023-11-10

## 2023-11-05 MED ORDER — IPRATROPIUM BROMIDE 0.06 % NA SOLN: 2.0000 | Freq: Four times a day (QID) | NASAL | 12 refills | Status: AC

## 2023-11-05 NOTE — ED Triage Notes (Signed)
 Pt c/o headache, sinus pressure and SOB for several weeks. Pt was admitted to the hospital on 7/18 and diagnosed with sepsis and pneumonia. She has been on 2 rounds of antibiotics and does not feel completely better. She had a virtual visit today and advised to be seen at the urgent care.

## 2023-11-05 NOTE — ED Provider Notes (Signed)
 [facility]    CSN: [id number] Arrival date & time: 11/05/23  9065      History   Chief Complaint Chief Complaint  Patient presents with   Headache   sinus pressure    Shortness of Breath    HPI Elizabeth [name] is a 73 y.o. female.   HPI  73 year old female with past medical history significant for COPD, centrilobular emphysema, hyperlipidemia, hypertension, type 2 diabetes,, IDA, OSA on CPAP, MDD, QT prolongation, congenital factor X deficiency, and atrial fibrillation presents for evaluation of respiratory symptoms to include headache, sinus pressure, shortness of breath has been present for several weeks.  She was hospitalized on 718 for sepsis and pneumonia and was given 2 rounds of antibiotics and feels she has not gotten completely better.  She reports doing a virtual visit today and was advised to be seen at urgent care.  Past Medical History:  Diagnosis Date   Allergy    Anxiety    B12 deficiency    Carpal tunnel syndrome    Centrilobular emphysema (HCC)    COPD (chronic obstructive pulmonary disease) (HCC)    Depression    Genetic testing    Common Cancers panel (47 genes) @ [facility] - No pathogenic mutations detected   HLD (hyperlipidemia)    Hyperlipidemia    Hypertension    Iron deficiency anemia 10/16/2016   Lipoma of skin    Lung nodule    Normocytic anemia 10/16/2016   QT prolongation    Substance abuse (HCC)     Patient Active Problem List   Diagnosis Date Noted   Acute hypoxic respiratory failure (HCC) 10/18/2023   Severe sepsis (HCC) 10/18/2023   COPD with acute exacerbation (HCC) 10/18/2023   CAP (community acquired pneumonia) 10/18/2023   Weakness generalized 10/09/2023   Type 2 diabetes mellitus with other specified complication (HCC) 10/06/2021   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) 07/04/2021   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) 07/04/2021   OSA on CPAP 06/26/2019   Combined form of  senile cataract of right eye 03/11/2018   Senile nuclear sclerosis, left 03/11/2018   Substance abuse (HCC)    Genetic testing    Pernicious anemia 11/27/2016   Normocytic anemia 10/16/2016   Iron deficiency anemia 10/16/2016   Centrilobular emphysema (HCC)    Major depressive disorder, recurrent, moderate (HCC) 09/24/2016   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   Bilateral carpal tunnel syndrome 05/27/2015   Bilateral hand numbness 05/17/2015   Fracture of humerus, proximal, closed 03/23/2013   Lipoma of skin and subcutaneous tissue 12/26/2011   B12 deficiency 02/15/2011   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      Home Medications    Prior to Admission medications   Medication Sig Start Date End Date Taking? Authorizing Provider  ipratropium (ATROVENT ) 0.06 % nasal spray Place 2 sprays into both nostrils 4 (four) times daily. 11/05/23  Yes Bernardino Ditch, NP  predniSONE  (DELTASONE ) 20  MG tablet Take 3 tablets (60 mg total) by mouth daily with breakfast for 5 days. 3 tablets daily for 5 days. 11/05/23 11/10/23 Yes Bernardino Ditch, NP  albuterol  (VENTOLIN  HFA) 108 (90 Base) MCG/ACT inhaler Inhale 2 puffs into the lungs every 6 (six) hours as needed for wheezing or shortness of breath. 03/21/23   Karamalegos, Marsa PARAS, DO  apixaban  (ELIQUIS ) 5 MG TABS tablet Take 1 tablet (5 mg total) by mouth 2 (two) times daily. 10/23/23   Burnette, Jennifer M, PA-C  ARIPiprazole  (ABILIFY ) 5 MG tablet Take 1 tablet by mouth once daily 05/14/23    Karamalegos, Marsa PARAS, DO  busPIRone  (BUSPAR ) 5 MG tablet Take 1 tablet (5 mg total) by mouth 3 (three) times daily. 10/23/23   Vivienne Delon HERO, PA-C  cefUROXime  (CEFTIN ) 500 MG tablet Take 1 tablet (500 mg total) by mouth 2 (two) times daily with a meal for 10 days. 10/26/23 11/05/23  Kingston Robes, PA-C  cyanocobalamin  (VITAMIN B12) 1000 MCG/ML injection Inject 1 mL (1,000 mcg total) into the muscle every 30 (thirty) days. 10/04/23   Reyes, Shaylo, PA-C  fluticasone  (FLONASE ) 50 MCG/ACT nasal spray Place 2 sprays into both nostrils daily. 10/29/23   Karamalegos, Marsa PARAS, DO  furosemide  (LASIX ) 40 MG tablet Take 40 mg by mouth.  Take 1 tablet (40 mg total) by mouth once daily 08/28/23   [provider]  losartan  (COZAAR ) 50 MG tablet Take 1 tablet by mouth once daily 10/15/23   Edman, Marsa PARAS, DO  MAGNESIUM -OXIDE 400 (240 Mg) MG tablet Take 1 tablet by mouth once daily 10/15/23   Edman, Marsa PARAS, DO  metoprolol  succinate (TOPROL -XL) 25 MG 24 hr tablet Take 1 tablet (25 mg total) by mouth daily. Take with or immediately following a meal. 10/11/23   Karamalegos, Marsa PARAS, DO  Multiple Vitamin (MULTI-VITAMINS) TABS Take by mouth.    [provider]  Nebulizers (PARI PRONEB MAX LC SPRINT) MISC  09/23/23   [provider]  omeprazole  (PRILOSEC) 20 MG capsule TAKE 1 CAPSULE BY MOUTH ONCE DAILY BEFORE BREAKFAST 09/10/23   Karamalegos, Marsa PARAS, DO  potassium chloride  SA (KLOR-CON  M) 20 MEQ tablet Take 1 tablet by mouth once daily 09/10/23   Karamalegos, Marsa PARAS, DO  rosuvastatin  (CRESTOR ) 40 MG tablet Take 40 mg by mouth daily. 08/28/23 08/27/24  [provider]  RYBELSUS  3 MG TABS Take 1 tablet (3 mg total) by mouth daily. Patient not taking: Reported on 10/30/2023 09/04/23   Edman Marsa PARAS, DO  saccharomyces boulardii (FLORASTOR) 250 MG capsule Take 1 capsule (250 mg total) by mouth 2 (two) times daily. 10/23/23   Vivienne Delon HERO,  PA-C  SPIRIVA  HANDIHALER 18 MCG inhalation capsule Place 1 capsule (18 mcg total) into inhaler and inhale daily. 12/20/22   Karamalegos, Marsa PARAS, DO  SYMBICORT 160-4.5 MCG/ACT inhaler Inhale 2 puffs into the lungs. 04/02/22   [provider]  venlafaxine  XR (EFFEXOR -XR) 75 MG 24 hr capsule TAKE 3 CAPSULES BY MOUTH ONCE DAILY WITH BREAKFAST 10/25/23   Edman, Marsa PARAS, DO    Family History Family History  Problem Relation Age of Onset   Ovarian cancer Mother 77       deceased 52   Breast cancer Mother 58   Stroke Father    Lung cancer Paternal Aunt    Lung cancer Cousin     Social History Social History   Tobacco Use   Smoking status: Every Day    Current packs/day: 0.50  Average packs/day: 0.5 packs/day for 52.0 years (26.0 ttl pk-yrs)    Types: Cigarettes   Smokeless tobacco: Never   Tobacco comments:    5 ciggarette a day--03/02/2020  Vaping Use   Vaping status: Some Days  Substance Use Topics   Alcohol use: No   Drug use: No     Allergies   Moxifloxacin, Levofloxacin, and Amoxicillin -pot clavulanate   Review of Systems Review of Systems  Constitutional:  Negative for fever.  HENT:  Positive for congestion and sinus pressure. Negative for ear pain, rhinorrhea and sore throat.   Respiratory:  Positive for shortness of breath. Negative for cough and wheezing.   Neurological:  Positive for headaches.     Physical Exam Triage Vital Signs ED Triage Vitals  Encounter Vitals Group     BP      Girls Systolic BP Percentile      Girls Diastolic BP Percentile      Boys Systolic BP Percentile      Boys Diastolic BP Percentile      Pulse      Resp      Temp      Temp src      SpO2      Weight      Height      Head Circumference      Peak Flow      Pain Score      Pain Loc      Pain Education      Exclude from Growth Chart    No data found.  Updated Vital Signs BP (!) 162/67 (BP Location: Left Arm)   Pulse 87   Temp 98.7 F (37.1 C)  (Oral)   Resp 18   SpO2 96%   Visual Acuity Right Eye Distance:   Left Eye Distance:   Bilateral Distance:    Right Eye Near:   Left Eye Near:    Bilateral Near:     Physical Exam Vitals and nursing note reviewed.  Constitutional:      Appearance: Normal appearance. She is not ill-appearing.  HENT:     Head: Normocephalic and atraumatic.     Right Ear: Tympanic membrane, ear canal and external ear normal. There is no impacted cerumen.     Left Ear: Tympanic membrane, ear canal and external ear normal. There is no impacted cerumen.     Nose: Congestion and rhinorrhea present.     Comments: This mucosa is edematous and erythematous without appreciable discharge.  No tenderness to compression of bilateral frontal or maxillary sinuses.    Mouth/Throat:     Mouth: Mucous membranes are moist.     Pharynx: Oropharynx is clear. No oropharyngeal exudate or posterior oropharyngeal erythema.  Cardiovascular:     Rate and Rhythm: Normal rate. Rhythm irregular.     Pulses: Normal pulses.     Heart sounds: Normal heart sounds. No murmur heard.    No friction rub. No gallop.  Pulmonary:     Effort: Pulmonary effort is normal.     Breath sounds: Normal breath sounds. No wheezing, rhonchi or rales.  Musculoskeletal:     Cervical back: Normal range of motion and neck supple. No tenderness.  Lymphadenopathy:     Cervical: No cervical adenopathy.  Skin:    General: Skin is warm and dry.     Capillary Refill: Capillary refill takes less than 2 seconds.     Findings: No rash.  Neurological:     General: No focal deficit  present.     Mental Status: She is alert and oriented to person, place, and time.      UC Treatments / Results  Labs (all labs ordered are listed, but only abnormal results are displayed) Labs Reviewed  CBC WITH DIFFERENTIAL/PLATELET - Abnormal; Notable for the following components:      Result Value   Hemoglobin 10.5 (*)    HCT 32.8 (*)    All other components  within normal limits  COMPREHENSIVE METABOLIC PANEL WITH GFR - Abnormal; Notable for the following components:   Glucose, Bld 134 (*)    Calcium  8.7 (*)    All other components within normal limits    EKG Atrial fibrillation with RVR Ventricular rate 102 bpm PR interval absent QRS duration 70 ms QT/QTc 388/505 ms T wave inversions in leads V2 through V6.  Which is an interval change from EKG dated 10/19/2023.  Radiology DG Chest 2 View Result Date: 11/05/2023 CLINICAL DATA:  Shortness of breath for the past 2 weeks. Recently diagnosed with sepsis and pneumonia. Status post 2 rounds of antibiotics with incomplete resolution of symptoms. EXAM: CHEST - 2 VIEW COMPARISON:  10/18/2023 FINDINGS: Borderline enlarged cardiac silhouette with an interval mild decrease in size. Clear lungs with normal vascularity. The lungs are mildly hyperexpanded with mild peribronchial thickening. Mild thoracic spine degenerative changes. IMPRESSION: 1. No acute abnormality. 2. Mild changes of COPD and chronic bronchitis. 3. Borderline cardiomegaly. Electronically Signed   By: Elspeth Bathe M.D.   On: 11/05/2023 11:33    Procedures Procedures (including critical care time)  Medications Ordered in UC Medications - No data to display  Initial Impression / Assessment and Plan / UC Course  I have reviewed the triage vital signs and the nursing notes.  Pertinent labs & imaging results that were available during my care of the patient were reviewed by me and considered in my medical decision making (see chart for details).   Patient is a nontoxic-appearing 73 year old female presenting for evaluation of respiratory symptoms as outlined in the HPI above.  Her primary complaint is headache and frontal sinus pressure with some nasal congestion but no nasal discharge.  Also no shortness of breath but no cough, wheezing, or fever.  Review of records in epic shows the patient was hospitalized on 10/18/2023 for acute hypoxic  respiratory failure, sepsis, and pneumonia.  She reports that her symptoms began similar to the symptoms she is having at present.  She did not have a cough, or fever at that time either.  She does have a history of lung cancer but has not had a PET scan in the last 4 years since the cancer was discovered.  Patient recently had an echocardiogram on 10/19/2023 that showed left ventricular ejection fraction at 50 to 55% with low normal function.  Mild concentric left ventricular hypertrophy.  Right ventricular systolic function was normal.  Left and right atrium are also normal.  I did switch EKG because the triage complaint indicated that the patient was experiencing chest pain, though she denies any chest pain.  She is in atrial fibrillation with a ventricular response rate of 102.  O2 is 96% on room air with respiratory rate of 18.  EKG has T wave inversions in V2 through V6.  These are also present but to a lesser degree on EKG dated 10/19/2023.  Will obtain CBC, CMP, and chest x-ray to evaluate for any acute infection or cardiopulmonary process.  Advised the patient that she may still want  to being sent to the emergency department, especially if her labs or chest x-ray are abnormal.  CBC shows normal white count of 6.2.  Mild anemia with an H&H of 10.5 and 32 point respectively.  Platelets are normal at 232.  No abnormalities to the differential. CBC is on par with her trend over the last month.  CMP shows mild hypoglycemia with a calcium  of 8.7.  Electrolytes, renal function, transaminases all unremarkable.  Chest x-ray independently reviewed and evaluated by me.  Impression: Poor penetration due to patient's body habitus and the fact that this is a portable chest x-ray.  Haziness to the left lung base.  Blunting of bilateral costophrenic angles.  Lateral projection has a line of demarcation suspicious for possible effusion.  Radiology overread is pending. Radiology impression states no acute abnormality.   Mild changes of COPD and chronic bronchitis.  Borderline cardiomegaly.  When compared to chest x-ray from 10/09/2023 image appears stable and unchanged.  Patient's workup is very reassuring.  Given that she continues to have nasal congestion and I will prescribe Atrovent  nasal spray to help decrease the congestion.  She may use over-the-counter Tylenol  according to package instructions as needed for her headache.  I will also prescribe a short course of prednisone  to help decrease pulmonary inflammation which should help with shortness of breath and wheezing.  She should continue to use  Final Clinical Impressions(s) / UC Diagnoses   Final diagnoses:  Shortness of breath  Upper respiratory tract infection, unspecified type  COPD exacerbation (HCC)     Discharge Instructions      Your blood work was very reassuring and did not show any electrolyte abnormalities or systemic infection.  Your chest x-ray shows some mild COPD changes but no acute abnormality.  Your physical exam does reveal you have inflammation of your upper respiratory tract which is suspicious for an upper respiratory tract infection.  However, you have been on 2 rounds of antibiotics and I do not feel you need a third.  Use the Atrovent  nasal spray, 2 squirts of each nostril every 6 hours, as needed for sinus pressure and frontal headache.  Take over-the-counter Tylenol , 1000 mg every 6 hours, to help with pain.  Starting tomorrow morning begin taking 60 mg of prednisone  each day.  This will decrease inflammation in the body which should help your headache as well as improve your breathing.  Continue to use your albuterol  inhaler every 4-6 hours as needed for any shortness of breath or wheezing.  As well as continue to use your Spiriva  and Symbicort.  If you develop any new or worsening symptoms other return for reevaluation, follow-up with your primary care provider, or follow-up with pulmonology.       ED  Prescriptions     Medication Sig Dispense Auth. Provider   ipratropium (ATROVENT ) 0.06 % nasal spray Place 2 sprays into both nostrils 4 (four) times daily. 15 mL Bernardino Ditch, NP   predniSONE  (DELTASONE ) 20 MG tablet Take 3 tablets (60 mg total) by mouth daily with breakfast for 5 days. 3 tablets daily for 5 days. 15 tablet Bernardino Ditch, NP      PDMP not reviewed this encounter.   Bernardino Ditch, NP 11/05/23 1140

## 2023-11-05 NOTE — Discharge Instructions (Addendum)
 Your blood work was very reassuring and did not show any electrolyte abnormalities or systemic infection.  Your chest x-ray shows some mild COPD changes but no acute abnormality.  Your physical exam does reveal you have inflammation of your upper respiratory tract which is suspicious for an upper respiratory tract infection.  However, you have been on 2 rounds of antibiotics and I do not feel you need a third.  Use the Atrovent  nasal spray, 2 squirts of each nostril every 6 hours, as needed for sinus pressure and frontal headache.  Take over-the-counter Tylenol , 1000 mg every 6 hours, to help with pain.  Starting tomorrow morning begin taking 60 mg of prednisone  each day.  This will decrease inflammation in the body which should help your headache as well as improve your breathing.  Continue to use your albuterol  inhaler every 4-6 hours as needed for any shortness of breath or wheezing.  As well as continue to use your Spiriva  and Symbicort.  If you develop any new or worsening symptoms other return for reevaluation, follow-up with your primary care provider, or follow-up with pulmonology.

## 2023-11-05 NOTE — Progress Notes (Signed)
 Patient with recent sepsis from pneumonia and atrial fibrillation. Feeling no improvements and reports of increasing head pressure like she felt when she was originally diagnosed.   Advised since not improving and has not had repeat imaging since hospitalization to check for improvement she should be seen in person at local urgent care facility.   Fortunato Dickinson, PA-C [facility]

## 2023-11-07 ENCOUNTER — Other Ambulatory Visit: Payer: Self-pay

## 2023-11-07 NOTE — Patient Instructions (Signed)
 Visit Information  Thank you for taking time to visit with me today. Please don't hesitate to contact me if I can be of assistance to you before our next scheduled telephone appointment.  Telephonic appointment scheduled with patient for next week 11/15/23 at 2 pm   Following is a copy of your care plan:   Goals Addressed             This Visit's Progress    VBCI Transitions of Care (TOC) Care Plan   On track    (All reviewed and or updated on 11/07/23 on telephonic call with patient).   Problems:  Recent Hospitalization for treatment of Pulmonary Disease Knowledge Deficit Related to Respiratory Failure/pneumonia  Goal:  Over the next 30 days, the patient will not experience hospital readmission  Interventions:  Transitions of Care:  No changes to SDOH or Allergy Reviews.  Doctor Visits  - discussed the importance of doctor visits Completed PCP follow up on 10/29/23. Incentive spirometry at home;reviewed with patient.  Troubleshoot use of DME in the home Fall prevention around oxygen tubing.  Only using oxygen as bed time now.  DME HOME oxygen via [facility]:  Discussed oxygen safety/fire safety and to let local fire station know of oxygen use at home.  Reviewed medications review, SDOH, allergies, Review of systems assessment, functional status, depression and anxiety screening Anxiety is greatly improved per patient, though it still has moments daily, mostly around her feelings of trauma from event that brought her to hospital; I can't breathe.  Reviewed recent ED/UC care visit. Started prednisone  for inflammatory URI, no other medication changes.  Discussed this may raise blood sugars and to monitor for this.  Wishes to have a pulmonary provider closer to home, now at [facility], encouraged to ask PCP for referral.  Home Health has been coming via [facility] coming twice a week. Patient and therapist, per patient stated, feels she is stronger, more steady than before.   Patient Self Care Activities:  Attend all scheduled provider appointments Call pharmacy for medication refills 3-7 days in advance of running out of medications Call provider office for new concerns or questions  Notify RN Care Manager of TOC call rescheduling needs Participate in Transition of Care Program/Attend TOC scheduled calls Perform all self care activities independently  Take medications as prescribed   Consider referral to LCSW if anxiety does not decrease, to reassess next call or speak to PCP again about this (Spoke to PCP on 11/07/23).  Consider some relaxation techniques.  Patient states more knowledge about her condition and what to call provider for has helped some with anxiety Family has a rescue plan in place.  Referral was made via PCP to [facility] Care management to start 11/25/23. This was reviewed and discussed with patient for any LCSW outreach/resources for anxiety issues if needed.  Declines referral at this time as feels she is improving and processing.  Continue to monitor SpO2, weights, blood pressure, blood sugars at home.  Keep log to take to provider Include spirometry readings and progression.  Contact PCP regarding pulmonary referral closer to home. (Pending?)  Plan:  Continue with current plan.  Telephonic appointment scheduled with patient for next week 11/15/23 at 2 pm.           Patient verbalizes understanding of instructions and care plan provided today and agrees to view in MyChart. Active MyChart status and patient understanding of how to access instructions and care plan via MyChart confirmed with patient.  Telephonic appointment scheduled with patient for next week 11/15/23 at 2 pm  Please call the care guide team at [phone] if you need to cancel or reschedule your appointment.   Please call the Suicide and Crisis Lifeline: 988 call 1-800-273-TALK (toll free, 24 hour hotline) call 911 if you are experiencing a Mental Health or  Behavioral Health Crisis or need someone to talk to.   Bing Edison MSN, RN RN Case [employer] executive [employer]  VBCI-Population Health Office Hours M-F [phone] Direct Dial: [phone] Main Phone [phone]  Fax: [phone] [email or web address]

## 2023-11-07 NOTE — Transitions of Care (Post Inpatient/ED Visit) (Signed)
 Transition of Care week 3  Visit Note  11/07/2023  Name: Elizabeth [name] MRN: [id number]          DOB: Nov 14, 1950  Situation: Patient enrolled in [facility] 30-day program. Visit completed with patient by telephone.   Background:   Initial Transition Care Management Follow-up Telephone Call    Past Medical History:  Diagnosis Date   Allergy    Anxiety    B12 deficiency    Carpal tunnel syndrome    Centrilobular emphysema (HCC)    COPD (chronic obstructive pulmonary disease) (HCC)    Depression    Genetic testing    Common Cancers panel (47 genes) @ [facility] - No pathogenic mutations detected   HLD (hyperlipidemia)    Hyperlipidemia    Hypertension    Iron deficiency anemia 10/16/2016   Lipoma of skin    Lung nodule    Normocytic anemia 10/16/2016   QT prolongation    Substance abuse (HCC)     Assessment: Patient Reported Symptoms: Cognitive Cognitive Status: Able to follow simple commands, Alert and oriented to person, place, and time, Normal speech and language skills      Neurological Neurological Review of Symptoms: No symptoms reported    HEENT  Sinus pressure/URI inflammation per ED/UC visit on 11/05/23.       Cardiovascular Cardiovascular Symptoms Reported: No symptoms reported Does patient have uncontrolled Hypertension?: No Cardiovascular Management Strategies: Medical device, Medication therapy, Routine screening, Adequate rest, Activity Weight: 183 lb (83 kg) (Patient reported: one lb gain as she is eating better.) Cardiovascular Self-Management Outcome: 4 (good)  Respiratory Other Respiratory Symptoms: Patient experience sinus pressure and some shortness of breath and went to ED/UC where was prescribed prednisone  for inflammation possibly related to URI. Did not sound short of breath or breathless over the phone call, patient was out with family but still wanted to talk. Additional Respiratory Details: Wearing oxygen at night and PRN now. SpO2 this am  97%, Respiratory Management Strategies: Activity, Adequate rest, CPAP, Oxygen therapy, Routine screening  Endocrine Endocrine Symptoms Reported: No symptoms reported Is patient diabetic?: Yes Is patient checking blood sugars at home?: Yes List most recent blood sugar readings, include date and time of day: 200 (Started on prednisone  on 11/06/23 after visit to ED/Urgent Care for sinus pressure/some shortness of breath.) Endocrine Self-Management Outcome: 4 (good)  Gastrointestinal Gastrointestinal Symptoms Reported: Reflux/heartburn Additional Gastrointestinal Details: Says her medications is not working as well, feels like food is slow to go down esophagus, has had before and patient related to stress she has been through recently.. Gastrointestinal Management Strategies: Medication therapy    Genitourinary Genitourinary Symptoms Reported: Urgency Additional Genitourinary Details: Not using BSC now, going to bathroom at night. Genitourinary Management Strategies: Coping strategies Genitourinary Self-Management Outcome: 4 (good)  Integumentary Integumentary Symptoms Reported: No symptoms reported    Musculoskeletal Musculoskelatal Symptoms Reviewed: No symptoms reported Additional Musculoskeletal Details: Patient reports her physical therapy is helping and therapist said she was doing great. Still coming to see patient at home twice a week. Musculoskeletal Management Strategies: Activity, Adequate rest, Coping strategies, Exercise, Routine screening, Medication therapy Musculoskeletal Self-Management Outcome: 4 (good) Falls in the past year?: No Patient at Risk for Falls Due to: Medication side effect, Impaired mobility Fall risk Follow up: Falls prevention discussed  Psychosocial Psychosocial Symptoms Reported: Hyperviligence, Report of significant loss, deaths, abandonment, traumatic incidents Additional Psychological Details: Today, patient states she is dealing with the trauma of not being  able to breath/recent admission better and is not  a obssessed with thinking about it all day. She was talkative, laughing, enaged in conversation today. She still has brief period or flashback to that feeling daily but it passes quickly and she is focusing on other things and overall feeling better. Behavioral Management Strategies: Medication therapy, Coping strategies, Adequate rest, Activity Behavioral Health Self-Management Outcome: 4 (good) Behavioral Health Comment: Today, patient states she is dealing with the trauma of not being able to breath/recent admission better and is not a obssessed with thinking about it all day. She was talkative, laughing, enaged in conversation today. She still has brief period or flashback to that feeling daily but it passes quickly and she is focusing on other things and overall feeling better. Techniques to Cope with Loss/Stress/Change: Spiritual practice(s), Diversional activities Quality of Family Relationships: supportive Do you feel physically threatened by others?: No   Vitals:   11/07/23 1446  SpO2: 97%    Medications Reviewed Today     Reviewed by Carolee Heron NOVAK, RN (Case Manager) on 11/07/23 at 1418  Med List Status: <None>   Medication Order Taking? Sig Documenting Provider Last Dose Status Informant  albuterol  (VENTOLIN  HFA) 108 (90 Base) MCG/ACT inhaler [id number] Yes Inhale 2 puffs into the lungs every 6 (six) hours as needed for wheezing or shortness of breath. Edman Marsa PARAS, DO  Active Child  apixaban  (ELIQUIS ) 5 MG TABS tablet [id number] Yes Take 1 tablet (5 mg total) by mouth 2 (two) times daily. Vivienne Nest M, PA-C  Active   ARIPiprazole  (ABILIFY ) 5 MG tablet [id number] Yes Take 1 tablet by mouth once daily Karamalegos, Marsa PARAS, DO  Active Child  busPIRone  (BUSPAR ) 5 MG tablet [id number] Yes Take 1 tablet (5 mg total) by mouth 3 (three) times daily. Vivienne Nest M, PA-C  Active   cyanocobalamin  (VITAMIN B12)  1000 MCG/ML injection [id number] Yes Inject 1 mL (1,000 mcg total) into the muscle every 30 (thirty) days. Kingston Robes, PA-C  Active Child  fluticasone  (FLONASE ) 50 MCG/ACT nasal spray [id number] Yes Place 2 sprays into both nostrils daily. Edman Marsa PARAS, DO  Active   furosemide  (LASIX ) 40 MG tablet [id number] Yes Take 40 mg by mouth.  Take 1 tablet (40 mg total) by mouth once daily [provider]  Active Child  ipratropium (ATROVENT ) 0.06 % nasal spray [id number] Yes Place 2 sprays into both nostrils 4 (four) times daily. Bernardino Ditch, NP  Active   losartan  (COZAAR ) 50 MG tablet [id number] Yes Take 1 tablet by mouth once daily Edman Marsa PARAS, DO  Active Child  MAGNESIUM -OXIDE 400 (240 Mg) MG tablet [id number] Yes Take 1 tablet by mouth once daily Edman Marsa PARAS, DO  Active Child  metoprolol  succinate (TOPROL -XL) 25 MG 24 hr tablet [id number] Yes Take 1 tablet (25 mg total) by mouth daily. Take with or immediately following a meal. Edman, Marsa PARAS, DO  Active Child  Multiple Vitamin (MULTI-VITAMINS) TABS [id number] Yes Take by mouth. [provider]  Active Child  Nebulizers (PARI PRONEB MAX LC SPRINT) MISC [id number] Yes  [provider]  Active Child  omeprazole  (PRILOSEC) 20 MG capsule [id number] Yes TAKE 1 CAPSULE BY MOUTH ONCE DAILY BEFORE BREAKFAST Karamalegos, Marsa PARAS, DO  Active Child  potassium chloride  SA (KLOR-CON  M) 20 MEQ tablet [id number] Yes Take 1 tablet by mouth once daily Edman Marsa PARAS, DO  Active Child  predniSONE  (DELTASONE ) 20 MG tablet [id number] Yes Take 3 tablets (60 mg total) by mouth daily with breakfast for 5 days. 3 tablets daily  for 5 days. Bernardino Ditch, NP  Active   rosuvastatin  (CRESTOR ) 40 MG tablet [id number] Yes Take 40 mg by mouth daily. [provider]  Active Child  RYBELSUS  3 MG TABS [id number]  Take 1 tablet (3 mg total) by mouth daily.  Patient not taking: Reported on 11/07/2023    Edman Marsa PARAS, DO  Active Child           Med Note [facility], ELIZABETH A   Fri Oct 18, 2023  7:23 AM) NOT STARTED  saccharomyces boulardii (FLORASTOR) 250 MG capsule [id number] Yes Take 1 capsule (250 mg total) by mouth 2 (two) times daily. Burnette, Jennifer M, PA-C  Active   SPIRIVA  HANDIHALER 18 MCG inhalation capsule [id number] Yes Place 1 capsule (18 mcg total) into inhaler and inhale daily. Edman Marsa PARAS, DO  Active Child  SYMBICORT 160-4.5 MCG/ACT inhaler [id number] Yes Inhale 2 puffs into the lungs. [provider]  Active Child  venlafaxine  XR (EFFEXOR -XR) 75 MG 24 hr capsule [id number] Yes TAKE 3 CAPSULES BY MOUTH ONCE DAILY WITH BREAKFAST Karamalegos, Alexander J, DO  Active           The patient has been provided with contact information for the care management team and has been advised to call with any health-related questions or concerns that are not emergent or urgent.  The patient verbalized understanding with current POC.   The patient is directed to their insurance card regarding availability of benefits coverage    Recommendation:   Continue Current Plan of Care  Follow Up Plan:   Telephonic appointment scheduled with patient for next week 11/15/23 at 2 pm   Bing Edison MSN, RN RN Case Manager [place]  [facility] Office Hours [phone] [phone] Direct Dial: [phone] Main Phone [phone]  Fax: [phone] [email or web address]

## 2023-11-08 ENCOUNTER — Encounter: Payer: Self-pay | Admitting: Family Medicine

## 2023-11-08 DIAGNOSIS — J329 Chronic sinusitis, unspecified: Secondary | ICD-10-CM

## 2023-11-09 ENCOUNTER — Telehealth: Admitting: Physician Assistant

## 2023-11-09 DIAGNOSIS — U071 COVID-19: Secondary | ICD-10-CM | POA: Diagnosis not present

## 2023-11-09 MED ORDER — MOLNUPIRAVIR EUA 200MG CAPSULE: 4.0000 | ORAL_CAPSULE | Freq: Two times a day (BID) | ORAL | 0 refills | Status: AC

## 2023-11-09 MED ORDER — BENZONATATE 100 MG PO CAPS
100.0000 mg | ORAL_CAPSULE | Freq: Three times a day (TID) | ORAL | 0 refills | Status: DC | PRN
Start: 2023-11-09 — End: 2024-01-03

## 2023-11-09 NOTE — Patient Instructions (Addendum)
 Elizabeth [name], thank you for joining Elizabeth Velma Lunger, PA-C for today's virtual visit.  While this provider is not your primary care provider (PCP), if your PCP is located in our provider database this encounter information will be shared with them immediately following your visit.   A [name] account gives you access to today's visit and all your visits, tests, and labs performed at [facility]  click here if you don't have a [name] account or go to [name]  Consent: (Patient) Elizabeth [name] provided verbal consent for this virtual visit at the beginning of the encounter.  Current Medications:  Current Outpatient Medications:    benzonatate  (TESSALON ) 100 MG capsule, Take 1 capsule (100 mg total) by mouth 3 (three) times daily as needed for cough., Disp: 30 capsule, Rfl: 0   molnupiravir  EUA (LAGEVRIO ) 200 mg CAPS capsule, Take 4 capsules (800 mg total) by mouth 2 (two) times daily for 5 days., Disp: 40 capsule, Rfl: 0   albuterol  (VENTOLIN  HFA) 108 (90 Base) MCG/ACT inhaler, Inhale 2 puffs into the lungs every 6 (six) hours as needed for wheezing or shortness of breath., Disp: 8 g, Rfl: 5   apixaban  (ELIQUIS ) 5 MG TABS tablet, Take 1 tablet (5 mg total) by mouth 2 (two) times daily., Disp: 60 tablet, Rfl: 0   ARIPiprazole  (ABILIFY ) 5 MG tablet, Take 1 tablet by mouth once daily, Disp: 90 tablet, Rfl: 1   busPIRone  (BUSPAR ) 5 MG tablet, Take 1 tablet (5 mg total) by mouth 3 (three) times daily., Disp: 90 tablet, Rfl: 0   cyanocobalamin  (VITAMIN B12) 1000 MCG/ML injection, Inject 1 mL (1,000 mcg total) into the muscle every 30 (thirty) days., Disp: 3 mL, Rfl: 0   fluticasone  (FLONASE ) 50 MCG/ACT nasal spray, Place 2 sprays into both nostrils daily., Disp: 16 mL, Rfl: 5   furosemide  (LASIX ) 40 MG tablet, Take 40 mg by mouth.  Take 1 tablet (40 mg total) by mouth once daily, Disp: , Rfl:    ipratropium (ATROVENT ) 0.06 % nasal spray, Place  2 sprays into both nostrils 4 (four) times daily., Disp: 15 mL, Rfl: 12   losartan  (COZAAR ) 50 MG tablet, Take 1 tablet by mouth once daily, Disp: 90 tablet, Rfl: 0   MAGNESIUM -OXIDE 400 (240 Mg) MG tablet, Take 1 tablet by mouth once daily, Disp: 90 tablet, Rfl: 0   metoprolol  succinate (TOPROL -XL) 25 MG 24 hr tablet, Take 1 tablet (25 mg total) by mouth daily. Take with or immediately following a meal., Disp: 90 tablet, Rfl: 1   Multiple Vitamin (MULTI-VITAMINS) TABS, Take by mouth., Disp: , Rfl:    Nebulizers (PARI PRONEB MAX LC SPRINT) MISC, , Disp: , Rfl:    omeprazole  (PRILOSEC) 20 MG capsule, TAKE 1 CAPSULE BY MOUTH ONCE DAILY BEFORE BREAKFAST, Disp: 90 capsule, Rfl: 0   potassium chloride  SA (KLOR-CON  M) 20 MEQ tablet, Take 1 tablet by mouth once daily, Disp: 90 tablet, Rfl: 0   predniSONE  (DELTASONE ) 20 MG tablet, Take 3 tablets (60 mg total) by mouth daily with breakfast for 5 days. 3 tablets daily for 5 days., Disp: 15 tablet, Rfl: 0   rosuvastatin  (CRESTOR ) 40 MG tablet, Take 40 mg by mouth daily., Disp: , Rfl:    RYBELSUS  3 MG TABS, Take 1 tablet (3 mg total) by mouth daily. (Patient not taking: Reported on 11/07/2023), Disp: , Rfl:    saccharomyces boulardii (FLORASTOR) 250 MG capsule, Take 1 capsule (250 mg total) by mouth 2 (two) times  daily., Disp: 30 capsule, Rfl: 0   SPIRIVA  HANDIHALER 18 MCG inhalation capsule, Place 1 capsule (18 mcg total) into inhaler and inhale daily., Disp: 90 capsule, Rfl: 3   SYMBICORT 160-4.5 MCG/ACT inhaler, Inhale 2 puffs into the lungs., Disp: , Rfl:    venlafaxine  XR (EFFEXOR -XR) 75 MG 24 hr capsule, TAKE 3 CAPSULES BY MOUTH ONCE DAILY WITH BREAKFAST, Disp: 270 capsule, Rfl: 0   Medications ordered in this encounter:  Meds ordered this encounter  Medications   molnupiravir  EUA (LAGEVRIO ) 200 mg CAPS capsule    Sig: Take 4 capsules (800 mg total) by mouth 2 (two) times daily for 5 days.    Dispense:  40 capsule    Refill:  0    Supervising  Provider:   LAMPTEY, PHILIP O [[id number]]   benzonatate  (TESSALON ) 100 MG capsule    Sig: Take 1 capsule (100 mg total) by mouth 3 (three) times daily as needed for cough.    Dispense:  30 capsule    Refill:  0    Supervising Provider:   BLAISE ALEENE KIDD [[id number]]     *If you need refills on other medications prior to your next appointment, please contact your pharmacy*  Follow-Up: Call back or seek an in-person evaluation if the symptoms worsen or if the condition fails to improve as anticipated.  [facility] [phone]  Care Instructions: Please keep well-hydrated and get plenty of rest. Start a saline nasal rinse to flush out your nasal passages. You can use plain Mucinex to help thin congestion. Finish your prednisone  course previously given. If you have a humidifier, running in the bedroom at night. I want you to start OTC vitamin D3 1000 units daily, vitamin C 1000 mg daily, and a zinc supplement. Please take prescribed medications as directed.  If you note any non-resolving, new, or worsening symptoms despite treatment, please seek an in-person evaluation ASAP.       Isolation Instructions: You are to isolate at home until you have been fever free for at least 24 hours without a fever-reducing medication, and symptoms have been steadily improving for 24 hours. At that time,  you can end isolation but need to mask for an additional 5 days.   If you must be around other household members who do not have symptoms, you need to make sure that both you and the family members are masking consistently with a high-quality mask.  If you note any worsening of symptoms despite treatment, please seek an in-person evaluation ASAP. If you note any significant shortness of breath or any chest pain, please seek ER evaluation. Please do not delay care!   COVID-19: What to Do if You Are Sick If you test positive and are an older adult or someone who is at high risk of  getting very sick from COVID-19, treatment may be available. Contact a healthcare provider right away after a positive test to determine if you are eligible, even if your symptoms are mild right now. You can also visit a Test to Treat location and, if eligible, receive a prescription from a provider. Don't delay: Treatment must be started within the first few days to be effective. If you have a fever, cough, or other symptoms, you might have COVID-19. Most people have mild illness and are able to recover at home. If you are sick: Keep track of your symptoms. If you have an emergency warning sign (including trouble breathing), call 911. Steps to help prevent the spread  of COVID-19 if you are sick If you are sick with COVID-19 or think you might have COVID-19, follow the steps below to care for yourself and to help protect other people in your home and community. Stay home except to get medical care Stay home. Most people with COVID-19 have mild illness and can recover at home without medical care. Do not leave your home, except to get medical care. Do not visit public areas and do not go to places where you are unable to wear a mask. Take care of yourself. Get rest and stay hydrated. Take over-the-counter medicines, such as acetaminophen , to help you feel better. Stay in touch with your doctor. Call before you get medical care. Be sure to get care if you have trouble breathing, or have any other emergency warning signs, or if you think it is an emergency. Avoid public transportation, ride-sharing, or taxis if possible. Get tested If you have symptoms of COVID-19, get tested. While waiting for test results, stay away from others, including staying apart from those living in your household. Get tested as soon as possible after your symptoms start. Treatments may be available for people with COVID-19 who are at risk for becoming very sick. Don't delay: Treatment must be started early to be effective--some  treatments must begin within 5 days of your first symptoms. Contact your healthcare provider right away if your test result is positive to determine if you are eligible. Self-tests are one of several options for testing for the virus that causes COVID-19 and may be more convenient than laboratory-based tests and point-of-care tests. Ask your healthcare provider or your local health department if you need help interpreting your test results. You can visit your state, tribal, local, and territorial health department's website to look for the latest local information on testing sites. Separate yourself from other people As much as possible, stay in a specific room and away from other people and pets in your home. If possible, you should use a separate bathroom. If you need to be around other people or animals in or outside of the home, wear a well-fitting mask. Tell your close contacts that they may have been exposed to COVID-19. An infected person can spread COVID-19 starting 48 hours (or 2 days) before the person has any symptoms or tests positive. By letting your close contacts know they may have been exposed to COVID-19, you are helping to protect everyone. See COVID-19 and Animals if you have questions about pets. If you are diagnosed with COVID-19, someone from the health department may call you. Answer the call to slow the spread. Monitor your symptoms Symptoms of COVID-19 include fever, cough, or other symptoms. Follow care instructions from your healthcare provider and local health department. Your local health authorities may give instructions on checking your symptoms and reporting information. When to seek emergency medical attention Look for emergency warning signs* for COVID-19. If someone is showing any of these signs, seek emergency medical care immediately: Trouble breathing Persistent pain or pressure in the chest New confusion Inability to wake or stay awake Pale, gray, or  blue-colored skin, lips, or nail beds, depending on skin tone *This list is not all possible symptoms. Please call your medical provider for any other symptoms that are severe or concerning to you. Call 911 or call ahead to your local emergency facility: Notify the operator that you are seeking care for someone who has or may have COVID-19. Call ahead before visiting your doctor Call ahead. Many medical  visits for routine care are being postponed or done by phone or telemedicine. If you have a medical appointment that cannot be postponed, call your doctor's office, and tell them you have or may have COVID-19. This will help the office protect themselves and other patients. If you are sick, wear a well-fitting mask You should wear a mask if you must be around other people or animals, including pets (even at home). Wear a mask with the best fit, protection, and comfort for you. You don't need to wear the mask if you are alone. If you can't put on a mask (because of trouble breathing, for example), cover your coughs and sneezes in some other way. Try to stay at least 6 feet away from other people. This will help protect the people around you. Masks should not be placed on young children under age 64 years, anyone who has trouble breathing, or anyone who is not able to remove the mask without help. Cover your coughs and sneezes Cover your mouth and nose with a tissue when you cough or sneeze. Throw away used tissues in a lined trash can. Immediately wash your hands with soap and water for at least 20 seconds. If soap and water are not available, clean your hands with an alcohol-based hand sanitizer that contains at least 60% alcohol. Clean your hands often Wash your hands often with soap and water for at least 20 seconds. This is especially important after blowing your nose, coughing, or sneezing; going to the bathroom; and before eating or preparing food. Use hand sanitizer if soap and water are not  available. Use an alcohol-based hand sanitizer with at least 60% alcohol, covering all surfaces of your hands and rubbing them together until they feel dry. Soap and water are the best option, especially if hands are visibly dirty. Avoid touching your eyes, nose, and mouth with unwashed hands. Handwashing Tips Avoid sharing personal household items Do not share dishes, drinking glasses, cups, eating utensils, towels, or bedding with other people in your home. Wash these items thoroughly after using them with soap and water or put in the dishwasher. Clean surfaces in your home regularly Clean and disinfect high-touch surfaces (for example, doorknobs, tables, handles, light switches, and countertops) in your sick room and bathroom. In shared spaces, you should clean and disinfect surfaces and items after each use by the person who is ill. If you are sick and cannot clean, a caregiver or other person should only clean and disinfect the area around you (such as your bedroom and bathroom) on an as needed basis. Your caregiver/other person should wait as long as possible (at least several hours) and wear a mask before entering, cleaning, and disinfecting shared spaces that you use. Clean and disinfect areas that may have blood, stool, or body fluids on them. Use household cleaners and disinfectants. Clean visible dirty surfaces with household cleaners containing soap or detergent. Then, use a household disinfectant. Use a product from [employer] List N: Disinfectants for Coronavirus (COVID-19). Be sure to follow the instructions on the label to ensure safe and effective use of the product. Many products recommend keeping the surface wet with a disinfectant for a certain period of time (look at contact time on the product label). You may also need to wear personal protective equipment, such as gloves, depending on the directions on the product label. Immediately after disinfecting, wash your hands with soap  and water for 20 seconds. For completed guidance on cleaning and disinfecting your home,  visit Complete Disinfection Guidance. Take steps to improve ventilation at home Improve ventilation (air flow) at home to help prevent from spreading COVID-19 to other people in your household. Clear out COVID-19 virus particles in the air by opening windows, using air filters, and turning on fans in your home. Use this interactive tool to learn how to improve air flow in your home. When you can be around others after being sick with COVID-19 Deciding when you can be around others is different for different situations. Find out when you can safely end home isolation. For any additional questions about your care, contact your healthcare provider or state or local health department. 06/21/2020 Content source: [facility] ([facility]), Division of Viral Diseases This information is not intended to replace advice given to you by your health care provider. Make sure you discuss any questions you have with your health care provider. Document Revised: 08/04/2020 Document Reviewed: 08/04/2020 Elsevier Patient Education  2022 [employer].     If you have been instructed to have an in-person evaluation today at a local Urgent Care facility, please use the link below. It will take you to a list of all of our available [facility], including address, phone number and hours of operation. Please do not delay care.  [facility]  If you or a family member do not have a primary care provider, use the link below to schedule a visit and establish care. When you choose a [facility] primary care physician or advanced practice provider, you gain a long-term partner in health. Find a Primary Care Provider  Learn more about [place]'s in-office and virtual care options: [place] - Get Care Now

## 2023-11-09 NOTE — Progress Notes (Signed)
 Virtual Visit Consent   Elizabeth [name], you are scheduled for a virtual visit with a [facility] provider today. Just as with appointments in the office, your consent must be obtained to participate. Your consent will be active for this visit and any virtual visit you may have with one of our providers in the next 365 days. If you have a MyChart account, a copy of this consent can be sent to you electronically.  As this is a virtual visit, video technology does not allow for your provider to perform a traditional examination. This may limit your provider's ability to fully assess your condition. If your provider identifies any concerns that need to be evaluated in person or the need to arrange testing (such as labs, EKG, etc.), we will make arrangements to do so. Although advances in technology are sophisticated, we cannot ensure that it will always work on either your end or our end. If the connection with a video visit is poor, the visit may have to be switched to a telephone visit. With either a video or telephone visit, we are not always able to ensure that we have a secure connection.  By engaging in this virtual visit, you consent to the provision of healthcare and authorize for your insurance to be billed (if applicable) for the services provided during this visit. Depending on your insurance coverage, you may receive a charge related to this service.  I need to obtain your verbal consent now. Are you willing to proceed with your visit today? Elizabeth [name] has provided verbal consent on 11/09/2023 for a virtual visit (video or telephone). Elizabeth [name], [place]  Date: 11/09/2023 2:59 PM   Virtual Visit via Video Note   I, Elizabeth [name], connected with  Elizabeth [name]  ([id number], May 08, 1950) on 11/09/23 at  3:00 PM EDT by a video-enabled telemedicine application and verified that I am speaking with the correct person using two identifiers.  Location: Patient: Virtual Visit Location  Patient: Home Provider: Virtual Visit Location Provider: Home Office   I discussed the limitations of evaluation and management by telemedicine and the availability of in person appointments. The patient expressed understanding and agreed to proceed.    History of Present Illness: Elizabeth [name] is a 73 y.o. who identifies as a female who was assigned female at birth, and is being seen today for COVID-19. Patient endorses recent hospitalization for COPD exacerbation and pneumonia. Notes some change in breathing over the past 24 hours with fatigue. Denies fever. Increased nasal and chest congestion. O2 --  98% RA.  Finishing prednisone  from recent COPD exacerbation -- last day is tomorrow.  Has had COVID before -- a few years ago. Did not have hospitalization.  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) 07/04/2021   OSA on CPAP 06/26/2019   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 03/11/2018   Senile nuclear sclerosis, left 03/11/2018   Substance abuse (HCC)    Genetic testing    Pernicious anemia 11/27/2016   Normocytic anemia 10/16/2016   Iron deficiency anemia 10/16/2016  Centrilobular emphysema (HCC)    Major depressive disorder, recurrent, moderate (HCC) 09/24/2016   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   Bilateral carpal tunnel syndrome 05/27/2015   Bilateral hand numbness 05/17/2015   Fracture of humerus, proximal, closed 03/23/2013   Lipoma of skin and subcutaneous tissue 12/26/2011   B12 deficiency 02/15/2011   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   Dyspepsia and other specified disorders of function of stomach 10/11/2010   Benign essential hypertension 10/11/2010   Mixed hyperlipidemia 10/11/2010   Tobacco use disorder 10/11/2010   Anxiety state 08/25/2009   Shortness of breath 07/24/2006    Allergies:  Allergies  Allergen Reactions   Moxifloxacin Itching, Other (See Comments), Photosensitivity, Rash and Swelling    Causes swelling, redness, burning in eyes    Levofloxacin Other (See Comments)    Other reaction(s): Muscle Pain  Other reaction(s): Muscle Pain Other reaction(s): Muscle Pain   Amoxicillin -Pot Clavulanate Nausea Only and Other (See Comments)    Other Reaction: DIARRHEA.  Other reaction(s): Other (See Comments)  Other Reaction: DIARRHEA. Other reaction(s): Other (See Comments) Other Reaction: DIARRHEA. Other Reaction: DIARRHEA.  Other reaction(s): Other (See Comments) Other Reaction: DIARRHEA.    Other Reaction: DIARRHEA. Other Reaction: DIARRHEA.  Other reaction(s): Other (See Comments) Other Reaction: DIARRHEA.   Medications:  Current Outpatient Medications:    benzonatate  (TESSALON ) 100 MG capsule, Take 1 capsule (100 mg total) by mouth 3 (three) times daily as needed for cough., Disp: 30 capsule, Rfl: 0   molnupiravir  EUA (LAGEVRIO ) 200 mg CAPS capsule, Take 4 capsules (800 mg total) by mouth 2 (two) times daily for 5 days., Disp: 40 capsule, Rfl: 0   albuterol  (VENTOLIN  HFA) 108 (90 Base) MCG/ACT inhaler, Inhale 2 puffs into the lungs every 6 (six) hours as needed for wheezing or shortness of breath., Disp: 8 g, Rfl: 5   apixaban  (ELIQUIS ) 5 MG TABS tablet, Take 1 tablet (5 mg total) by mouth 2 (two) times daily., Disp: 60 tablet, Rfl: 0   ARIPiprazole  (ABILIFY ) 5 MG tablet, Take 1 tablet by mouth once daily, Disp: 90 tablet, Rfl: 1   busPIRone  (BUSPAR ) 5 MG tablet, Take 1 tablet (5 mg total) by mouth 3 (three) times daily., Disp: 90 tablet, Rfl: 0   cyanocobalamin   (VITAMIN B12) 1000 MCG/ML injection, Inject 1 mL (1,000 mcg total) into the muscle every 30 (thirty) days., Disp: 3 mL, Rfl: 0   fluticasone  (FLONASE ) 50 MCG/ACT nasal spray, Place 2 sprays into both nostrils daily., Disp: 16 mL, Rfl: 5   furosemide  (LASIX ) 40 MG tablet, Take 40 mg by mouth.  Take 1 tablet (40 mg total) by mouth once daily, Disp: , Rfl:    ipratropium (ATROVENT ) 0.06 % nasal spray, Place 2 sprays into both nostrils 4 (four) times daily., Disp: 15 mL, Rfl: 12   losartan  (COZAAR ) 50 MG tablet, Take 1 tablet by mouth once daily, Disp: 90 tablet, Rfl: 0   MAGNESIUM -OXIDE 400 (240 Mg) MG tablet, Take 1 tablet by mouth once daily, Disp: 90 tablet, Rfl: 0   metoprolol  succinate (TOPROL -XL) 25 MG 24 hr tablet, Take 1 tablet (25 mg total) by mouth daily. Take with or immediately following a meal., Disp: 90 tablet, Rfl: 1   Multiple Vitamin (MULTI-VITAMINS) TABS, Take by mouth., Disp: , Rfl:    Nebulizers (PARI PRONEB MAX LC SPRINT) MISC, , Disp: , Rfl:    omeprazole  (PRILOSEC) 20 MG capsule, TAKE 1 CAPSULE BY MOUTH ONCE DAILY BEFORE BREAKFAST, Disp: 90 capsule, Rfl: 0  potassium chloride  SA (KLOR-CON  M) 20 MEQ tablet, Take 1 tablet by mouth once daily, Disp: 90 tablet, Rfl: 0   predniSONE  (DELTASONE ) 20 MG tablet, Take 3 tablets (60 mg total) by mouth daily with breakfast for 5 days. 3 tablets daily for 5 days., Disp: 15 tablet, Rfl: 0   rosuvastatin  (CRESTOR ) 40 MG tablet, Take 40 mg by mouth daily., Disp: , Rfl:    RYBELSUS  3 MG TABS, Take 1 tablet (3 mg total) by mouth daily. (Patient not taking: Reported on 11/07/2023), Disp: , Rfl:    saccharomyces boulardii (FLORASTOR) 250 MG capsule, Take 1 capsule (250 mg total) by mouth 2 (two) times daily., Disp: 30 capsule, Rfl: 0   SPIRIVA  HANDIHALER 18 MCG inhalation capsule, Place 1 capsule (18 mcg total) into inhaler and inhale daily., Disp: 90 capsule, Rfl: 3   SYMBICORT 160-4.5 MCG/ACT inhaler, Inhale 2 puffs into the lungs., Disp: , Rfl:     venlafaxine  XR (EFFEXOR -XR) 75 MG 24 hr capsule, TAKE 3 CAPSULES BY MOUTH ONCE DAILY WITH BREAKFAST, Disp: 270 capsule, Rfl: 0  Observations/Objective: Patient is well-developed, well-nourished in no acute distress.  Resting comfortably at home.  Head is normocephalic, atraumatic.  No labored breathing. Speech is clear and coherent with logical content.  Patient is alert and oriented at baseline.   Assessment and Plan: 1. COVID-19 (Primary) - molnupiravir  EUA (LAGEVRIO ) 200 mg CAPS capsule; Take 4 capsules (800 mg total) by mouth 2 (two) times daily for 5 days.  Dispense: 40 capsule; Refill: 0 - benzonatate  (TESSALON ) 100 MG capsule; Take 1 capsule (100 mg total) by mouth 3 (three) times daily as needed for cough.  Dispense: 30 capsule; Refill: 0  Patient with multiple risk factors for complicated course of illness. Discussed risks/benefits of antiviral medications including most common potential ADRs. Patient voiced understanding and would like to proceed with antiviral medication. They are candidate for Molnupiravir . Not a candidate for Paxlovid due to her Eliquis  use. Rx sent to pharmacy. Supportive measures, OTC medications and vitamin regimen reviewed. Tessalon  per orders. Quarantine reviewed in detail. Strict ER precautions discussed with patient.    Follow Up Instructions: I discussed the assessment and treatment plan with the patient. The patient was provided an opportunity to ask questions and all were answered. The patient agreed with the plan and demonstrated an understanding of the instructions.  A copy of instructions were sent to the patient via MyChart unless otherwise noted below.   The patient was advised to call back or seek an in-person evaluation if the symptoms worsen or if the condition fails to improve as anticipated.    Elizabeth Velma Lunger, PA-C

## 2023-11-11 ENCOUNTER — Telehealth: Payer: Self-pay | Admitting: Family Medicine

## 2023-11-11 ENCOUNTER — Other Ambulatory Visit: Payer: Self-pay

## 2023-11-11 ENCOUNTER — Inpatient Hospital Stay: Admitting: Oncology

## 2023-11-11 ENCOUNTER — Inpatient Hospital Stay: Attending: Oncology

## 2023-11-11 DIAGNOSIS — D509 Iron deficiency anemia, unspecified: Secondary | ICD-10-CM | POA: Insufficient documentation

## 2023-11-11 DIAGNOSIS — Z79620 Long term (current) use of immunosuppressive biologic: Secondary | ICD-10-CM | POA: Insufficient documentation

## 2023-11-11 DIAGNOSIS — C3490 Malignant neoplasm of unspecified part of unspecified bronchus or lung: Secondary | ICD-10-CM

## 2023-11-11 DIAGNOSIS — Z79899 Other long term (current) drug therapy: Secondary | ICD-10-CM | POA: Insufficient documentation

## 2023-11-11 LAB — CBC WITH DIFFERENTIAL/PLATELET
Abs Immature Granulocytes: 0.12 K/uL — ABNORMAL HIGH (ref 0.00–0.07)
Basophils Absolute: 0 K/uL (ref 0.0–0.1)
Basophils Relative: 0 %
Eosinophils Absolute: 0 K/uL (ref 0.0–0.5)
Eosinophils Relative: 0 %
HCT: 33 % — ABNORMAL LOW (ref 36.0–46.0)
Hemoglobin: 10.3 g/dL — ABNORMAL LOW (ref 12.0–15.0)
Immature Granulocytes: 1 %
Lymphocytes Relative: 38 %
Lymphs Abs: 3.9 K/uL (ref 0.7–4.0)
MCH: 26.1 pg (ref 26.0–34.0)
MCHC: 31.2 g/dL (ref 30.0–36.0)
MCV: 83.8 fL (ref 80.0–100.0)
Monocytes Absolute: 0.9 K/uL (ref 0.1–1.0)
Monocytes Relative: 9 %
Neutro Abs: 5.4 K/uL (ref 1.7–7.7)
Neutrophils Relative %: 52 %
Platelets: 278 K/uL (ref 150–400)
RBC: 3.94 MIL/uL (ref 3.87–5.11)
RDW: 14 % (ref 11.5–15.5)
WBC: 10.4 K/uL (ref 4.0–10.5)
nRBC: 0 % (ref 0.0–0.2)

## 2023-11-11 LAB — VITAMIN B12: Vitamin B-12: 369 pg/mL (ref 180–914)

## 2023-11-11 LAB — FERRITIN: Ferritin: 14 ng/mL (ref 11–307)

## 2023-11-11 LAB — IRON AND TIBC
Iron: 21 ug/dL — ABNORMAL LOW (ref 28–170)
Saturation Ratios: 6 % — ABNORMAL LOW (ref 10.4–31.8)
TIBC: 371 ug/dL (ref 250–450)
UIBC: 350 ug/dL

## 2023-11-11 LAB — FOLATE: Folate: 20.5 ng/mL (ref >5.9)

## 2023-11-11 NOTE — Telephone Encounter (Unsigned)
 Copied from CRM [id number]. Topic: Clinical - Lab/Test Results >> Nov 11, 2023  5:17 PM Tiffini S wrote: Reason for CRM: Consuelo with [facility] called stating that the patient went to urgent care-  she tested positive for COVID- running fever, was prescribed PAXLOVID. Her grandson and daughter also tested positive. Please follow up with patient at [phone].

## 2023-11-12 ENCOUNTER — Inpatient Hospital Stay: Admitting: Oncology

## 2023-11-12 ENCOUNTER — Encounter: Payer: Self-pay | Admitting: Oncology

## 2023-11-12 ENCOUNTER — Telehealth: Payer: Self-pay

## 2023-11-12 VITALS — BP 150/80 | HR 123 | Temp 97.7°F | Resp 18 | Ht 62.0 in | Wt 182.0 lb

## 2023-11-12 DIAGNOSIS — Z79620 Long term (current) use of immunosuppressive biologic: Secondary | ICD-10-CM | POA: Diagnosis not present

## 2023-11-12 DIAGNOSIS — C3490 Malignant neoplasm of unspecified part of unspecified bronchus or lung: Secondary | ICD-10-CM | POA: Diagnosis not present

## 2023-11-12 DIAGNOSIS — D509 Iron deficiency anemia, unspecified: Secondary | ICD-10-CM | POA: Diagnosis not present

## 2023-11-12 DIAGNOSIS — Z79899 Other long term (current) drug therapy: Secondary | ICD-10-CM | POA: Diagnosis not present

## 2023-11-12 LAB — THYROID PANEL WITH TSH
Free Thyroxine Index: 2.6 (ref 1.2–4.9)
T3 Uptake Ratio: 29 % (ref 24–39)
T4, Total: 8.9 ug/dL (ref 4.5–12.0)
TSH: 3.88 u[IU]/mL (ref 0.450–4.500)

## 2023-11-12 NOTE — Progress Notes (Signed)
 [facility]  [phone] [phone]) [phone]  ID: Tennille Montelongo OB: [birth year]  MR#: [id number]  RDW#:[id number]  Patient Care Team: Edman Marsa PARAS, DO as PCP - General (Family Medicine) Verdene Gills, RN as Oncology Nurse Navigator Lenn Aran, MD as Radiation Oncologist (Radiation Oncology) Carolee Heron NOVAK, RN as Case Manager Jacobo Evalene PARAS, MD as Consulting Physician (Oncology)  CHIEF COMPLAINT: Iron  deficiency anemia.  INTERVAL HISTORY: Patient referred back to clinic for consideration of IV Venofer  after hospital admission and found to have iron  deficiency anemia.  She continues to have significant weakness and fatigue, but feels improved since discharge.  She has chronic shortness of breath. She has no neurologic complaints.  She denies any fevers.  She has a fair appetite and denies weight loss.  She has no chest pain, cough, or hemoptysis.  She denies any nausea, vomiting, constipation, or diarrhea.  She has no melena or hematochezia.  She has no urinary complaints.  Patient offers no further specific complaints today.  REVIEW OF SYSTEMS:   Review of Systems  Constitutional:  Positive for malaise/fatigue. Negative for fever and weight loss.  Respiratory:  Positive for shortness of breath. Negative for cough and hemoptysis.   Cardiovascular: Negative.  Negative for chest pain and leg swelling.  Gastrointestinal: Negative.  Negative for abdominal pain.  Genitourinary: Negative.  Negative for dysuria.  Musculoskeletal: Negative.  Negative for back pain.  Skin: Negative.  Negative for rash.  Neurological:  Positive for weakness. Negative for dizziness, focal weakness and headaches.  Psychiatric/Behavioral: Negative.  The patient is not nervous/anxious.     As per HPI. Otherwise, a complete review of systems is negative.  PAST MEDICAL HISTORY: Past Medical History:  Diagnosis Date   Allergy    Anxiety    B12 deficiency     Carpal tunnel syndrome    Centrilobular emphysema (HCC)    COPD (chronic obstructive pulmonary disease) (HCC)    Depression    Genetic testing    Common Cancers panel (47 genes) @ [facility] - No pathogenic mutations detected   HLD (hyperlipidemia)    Hyperlipidemia    Hypertension    Iron  deficiency anemia 10/16/2016   Lipoma of skin    Lung nodule    Normocytic anemia 10/16/2016   QT prolongation    Substance abuse (HCC)     PAST SURGICAL HISTORY: Past Surgical History:  Procedure Laterality Date   ABDOMINAL HYSTERECTOMY     BREAST EXCISIONAL BIOPSY Left    1980's neg   CESAREAN SECTION     TUBAL LIGATION      FAMILY HISTORY: Family History  Problem Relation Age of Onset   Ovarian cancer Mother 61       deceased 87   Breast cancer Mother 79   Stroke Father    Lung cancer Paternal Aunt    Lung cancer Cousin     ADVANCED DIRECTIVES (Y/N):  N  HEALTH MAINTENANCE: Social History   Tobacco Use   Smoking status: Every Day    Current packs/day: 0.50    Average packs/day: 0.5 packs/day for 52.0 years (26.0 ttl pk-yrs)    Types: Cigarettes   Smokeless tobacco: Never   Tobacco comments:    5 ciggarette a day--03/02/2020  Vaping Use   Vaping status: Some Days  Substance Use Topics   Alcohol use: No   Drug use: No     Colonoscopy:  PAP:  Bone density:  Lipid panel:  Allergies  Allergen Reactions   Moxifloxacin  Itching, Other (See Comments), Photosensitivity, Rash and Swelling    Causes swelling, redness, burning in eyes    Levofloxacin Other (See Comments)    Other reaction(s): Muscle Pain  Other reaction(s): Muscle Pain Other reaction(s): Muscle Pain   Amoxicillin -Pot Clavulanate Nausea Only and Other (See Comments)    Other Reaction: DIARRHEA.  Other reaction(s): Other (See Comments)  Other Reaction: DIARRHEA. Other reaction(s): Other (See Comments) Other Reaction: DIARRHEA. Other Reaction: DIARRHEA.  Other reaction(s): Other (See Comments) Other  Reaction: DIARRHEA.    Other Reaction: DIARRHEA. Other Reaction: DIARRHEA.  Other reaction(s): Other (See Comments) Other Reaction: DIARRHEA.    Current Outpatient Medications  Medication Sig Dispense Refill   albuterol  (VENTOLIN  HFA) 108 (90 Base) MCG/ACT inhaler Inhale 2 puffs into the lungs every 6 (six) hours as needed for wheezing or shortness of breath. 8 g 5   apixaban  (ELIQUIS ) 5 MG TABS tablet Take 1 tablet (5 mg total) by mouth 2 (two) times daily. 60 tablet 0   ARIPiprazole  (ABILIFY ) 5 MG tablet Take 1 tablet by mouth once daily 90 tablet 1   benzonatate  (TESSALON ) 100 MG capsule Take 1 capsule (100 mg total) by mouth 3 (three) times daily as needed for cough. 30 capsule 0   busPIRone  (BUSPAR ) 5 MG tablet Take 1 tablet (5 mg total) by mouth 3 (three) times daily. 90 tablet 0   cyanocobalamin  (VITAMIN B12) 1000 MCG/ML injection Inject 1 mL (1,000 mcg total) into the muscle every 30 (thirty) days. 3 mL 0   fluticasone  (FLONASE ) 50 MCG/ACT nasal spray Place 2 sprays into both nostrils daily. 16 mL 5   furosemide  (LASIX ) 40 MG tablet Take 40 mg by mouth.  Take 1 tablet (40 mg total) by mouth once daily     ipratropium (ATROVENT ) 0.06 % nasal spray Place 2 sprays into both nostrils 4 (four) times daily. 15 mL 12   losartan  (COZAAR ) 50 MG tablet Take 1 tablet by mouth once daily 90 tablet 0   MAGNESIUM -OXIDE 400 (240 Mg) MG tablet Take 1 tablet by mouth once daily 90 tablet 0   metoprolol  succinate (TOPROL -XL) 25 MG 24 hr tablet Take 1 tablet (25 mg total) by mouth daily. Take with or immediately following a meal. 90 tablet 1   molnupiravir  EUA (LAGEVRIO ) 200 mg CAPS capsule Take 4 capsules (800 mg total) by mouth 2 (two) times daily for 5 days. 40 capsule 0   Multiple Vitamin (MULTI-VITAMINS) TABS Take by mouth.     Nebulizers (PARI PRONEB MAX LC SPRINT) MISC      omeprazole  (PRILOSEC) 20 MG capsule TAKE 1 CAPSULE BY MOUTH ONCE DAILY BEFORE BREAKFAST 90 capsule 0   potassium chloride  SA  (KLOR-CON  M) 20 MEQ tablet Take 1 tablet by mouth once daily 90 tablet 0   rosuvastatin  (CRESTOR ) 40 MG tablet Take 40 mg by mouth daily.     RYBELSUS  3 MG TABS Take 1 tablet (3 mg total) by mouth daily.     saccharomyces boulardii (FLORASTOR) 250 MG capsule Take 1 capsule (250 mg total) by mouth 2 (two) times daily. 30 capsule 0   SPIRIVA  HANDIHALER 18 MCG inhalation capsule Place 1 capsule (18 mcg total) into inhaler and inhale daily. 90 capsule 3   SYMBICORT 160-4.5 MCG/ACT inhaler Inhale 2 puffs into the lungs.     venlafaxine  XR (EFFEXOR -XR) 75 MG 24 hr capsule TAKE 3 CAPSULES BY MOUTH ONCE DAILY WITH BREAKFAST 270 capsule 0   No current facility-administered medications for this visit.  OBJECTIVE: Vitals:   11/12/23 1402  BP: (!) 150/80  Pulse: (!) 123  Resp: 18  Temp: 97.7 F (36.5 C)  SpO2: 96%      Body mass index is 33.29 kg/m.    ECOG FS:2 - Symptomatic, <50% confined to bed  General: Well-developed, well-nourished, no acute distress.  Sitting in a wheelchair. Eyes: Pink conjunctiva, anicteric sclera. HEENT: Normocephalic, moist mucous membranes. Lungs: No audible wheezing or coughing. Heart: Regular rate and rhythm. Abdomen: Soft, nontender, no obvious distention. Musculoskeletal: No edema, cyanosis, or clubbing. Neuro: Alert, answering all questions appropriately. Cranial nerves grossly intact. Skin: No rashes or petechiae noted. Psych: Normal affect.  LAB RESULTS:  Lab Results  Component Value Date   NA 139 11/05/2023   K 3.5 11/05/2023   CL 101 11/05/2023   CO2 27 11/05/2023   GLUCOSE 134 (H) 11/05/2023   BUN 9 11/05/2023   CREATININE 0.71 11/05/2023   CALCIUM  8.7 (L) 11/05/2023   PROT 6.9 11/05/2023   ALBUMIN 3.6 11/05/2023   AST 39 11/05/2023   ALT 22 11/05/2023   ALKPHOS 98 11/05/2023   BILITOT 0.9 11/05/2023   GFRNONAA >60 11/05/2023   GFRAA >60 10/20/2019    Lab Results  Component Value Date   WBC 10.4 11/11/2023   NEUTROABS 5.4  11/11/2023   HGB 10.3 (L) 11/11/2023   HCT 33.0 (L) 11/11/2023   MCV 83.8 11/11/2023   PLT 278 11/11/2023   Lab Results  Component Value Date   IRON  21 (L) 11/11/2023   TIBC 371 11/11/2023   IRONPCTSAT 6 (L) 11/11/2023   Lab Results  Component Value Date   FERRITIN 14 11/11/2023     STUDIES: DG Chest 2 View Result Date: 11/05/2023 CLINICAL DATA:  Shortness of breath for the past 2 weeks. Recently diagnosed with sepsis and pneumonia. Status post 2 rounds of antibiotics with incomplete resolution of symptoms. EXAM: CHEST - 2 VIEW COMPARISON:  10/18/2023 FINDINGS: Borderline enlarged cardiac silhouette with an interval mild decrease in size. Clear lungs with normal vascularity. The lungs are mildly hyperexpanded with mild peribronchial thickening. Mild thoracic spine degenerative changes. IMPRESSION: 1. No acute abnormality. 2. Mild changes of COPD and chronic bronchitis. 3. Borderline cardiomegaly. Electronically Signed   By: Elspeth Bathe M.D.   On: 11/05/2023 11:33   ECHOCARDIOGRAM COMPLETE Result Date: 10/20/2023    ECHOCARDIOGRAM REPORT   Patient Name:   KANESHA CADLE Date of Exam: 10/19/2023 Medical Rec #:  [id number]      Height:       62.0 in Accession #:    [id number]     Weight:       184.5 lb Date of Birth:  23-Jul-1950      BSA:          1.847 m Patient Age:    73 years       BP:           115/58 mmHg Patient Gender: F              HR:           102 bpm. Exam Location:  [facility] Procedure: 2D Echo, Cardiac Doppler and Color Doppler (Both Spectral and Color            Flow Doppler were utilized during procedure). Indications:     Atrial Fibrillation I48.91  History:         Patient has no prior history of Echocardiogram examinations.  Risk Factors:Hypertension.  Sonographer:     Bari Roar Referring Phys:  [id number] BRENDA MORRISON Diagnosing Phys: Cara JONETTA Lovelace MD IMPRESSIONS  1. TDS.  2. Left ventricular ejection fraction, by estimation, is 50 to 55%. The left ventricle  has low normal function. The left ventricle has no regional wall motion abnormalities. There is mild concentric left ventricular hypertrophy. Left ventricular diastolic parameters are consistent with Grade I diastolic dysfunction (impaired relaxation).  3. Right ventricular systolic function is normal. The right ventricular size is normal.  4. The mitral valve is normal in structure. Trivial mitral valve regurgitation.  5. The aortic valve is normal in structure. Aortic valve regurgitation is trivial. Conclusion(s)/Recommendation(s): Poor windows for evaluation of left ventricular function by transthoracic echocardiography. Would recommend an alternative means of evaluation. FINDINGS  Left Ventricle: Left ventricular ejection fraction, by estimation, is 50 to 55%. The left ventricle has low normal function. The left ventricle has no regional wall motion abnormalities. Strain was performed and the global longitudinal strain is indeterminate. The left ventricular internal cavity size was normal in size. There is mild concentric left ventricular hypertrophy. Left ventricular diastolic parameters are consistent with Grade I diastolic dysfunction (impaired relaxation). Right Ventricle: The right ventricular size is normal. No increase in right ventricular wall thickness. Right ventricular systolic function is normal. Left Atrium: Left atrial size was normal in size. Right Atrium: Right atrial size was normal in size. Pericardium: There is no evidence of pericardial effusion. Mitral Valve: The mitral valve is normal in structure. Trivial mitral valve regurgitation. MV peak gradient, 6.0 mmHg. The mean mitral valve gradient is 3.0 mmHg. Tricuspid Valve: The tricuspid valve is normal in structure. Tricuspid valve regurgitation is trivial. Aortic Valve: The aortic valve is normal in structure. Aortic valve regurgitation is trivial. Aortic valve mean gradient measures 3.0 mmHg. Aortic valve peak gradient measures 8.1 mmHg.  Aortic valve area, by VTI measures 1.94 cm. Pulmonic Valve: The pulmonic valve was normal in structure. Pulmonic valve regurgitation is not visualized. Aorta: The ascending aorta was not well visualized. IAS/Shunts: No atrial level shunt detected by color flow Doppler. Additional Comments: TDS. 3D was performed not requiring image post processing on an independent workstation and was indeterminate.  LEFT VENTRICLE PLAX 2D LVIDd:         4.20 cm     Diastology LVIDs:         3.00 cm     LV e' medial:    6.20 cm/s LV PW:         1.20 cm     LV E/e' medial:  14.0 LV IVS:        1.20 cm     LV e' lateral:   7.62 cm/s LVOT diam:     1.60 cm     LV E/e' lateral: 11.4 LV SV:         44 LV SV Index:   24 LVOT Area:     2.01 cm  LV Volumes (MOD) LV vol d, MOD A2C: 50.8 ml LV vol d, MOD A4C: 60.6 ml LV vol s, MOD A2C: 25.3 ml LV vol s, MOD A4C: 26.9 ml LV SV MOD A2C:     25.5 ml LV SV MOD A4C:     60.6 ml LV SV MOD BP:      29.2 ml RIGHT VENTRICLE RV Basal diam:  2.50 cm RV Mid diam:    2.30 cm RV S prime:     12.30 cm/s TAPSE (M-mode): 1.8 cm  LEFT ATRIUM             Index        RIGHT ATRIUM          Index LA diam:        3.90 cm 2.11 cm/m   RA Area:     8.52 cm LA Vol (A2C):   37.4 ml 20.25 ml/m  RA Volume:   12.80 ml 6.93 ml/m LA Vol (A4C):   36.8 ml 19.92 ml/m LA Biplane Vol: 37.3 ml 20.19 ml/m  AORTIC VALVE                    PULMONIC VALVE AV Area (Vmax):    1.70 cm     PV Vmax:        1.33 m/s AV Area (Vmean):   1.87 cm     PV Peak grad:   7.1 mmHg AV Area (VTI):     1.94 cm     RVOT Peak grad: 5 mmHg AV Vmax:           142.00 cm/s AV Vmean:          84.400 cm/s AV VTI:            0.229 m AV Peak Grad:      8.1 mmHg AV Mean Grad:      3.0 mmHg LVOT Vmax:         120.00 cm/s LVOT Vmean:        78.300 cm/s LVOT VTI:          0.221 m LVOT/AV VTI ratio: 0.97  AORTA Ao Root diam: 2.90 cm Ao Asc diam:  3.20 cm MITRAL VALVE                TRICUSPID VALVE MV Area (PHT): 5.70 cm     TR Peak grad:   21.0 mmHg MV Area  VTI:   1.92 cm     TR Vmax:        229.00 cm/s MV Peak grad:  6.0 mmHg MV Mean grad:  3.0 mmHg     SHUNTS MV Vmax:       1.22 m/s     Systemic VTI:  0.22 m MV Vmean:      81.8 cm/s    Systemic Diam: 1.60 cm MV Decel Time: 133 msec MV E velocity: 86.80 cm/s MV A velocity: 125.00 cm/s MV E/A ratio:  0.69 MV A Prime:    13.8 cm/s Cara JONETTA Lovelace MD Electronically signed by Cara JONETTA Lovelace MD Signature Date/Time: 10/20/2023/9:15:59 AM    Final    CT Angio Chest Pulmonary Embolism (PE) W or WO Contrast Result Date: 10/19/2023 CLINICAL DATA:  Pulmonary embolism suspected. High probability. Possible sepsis. Shortness of breath. History non-small cell lung cancer. EXAM: CT ANGIOGRAPHY CHEST WITH CONTRAST TECHNIQUE: Multidetector CT imaging of the chest was performed using the standard protocol during bolus administration of intravenous contrast. Multiplanar CT image reconstructions and MIPs were obtained to evaluate the vascular anatomy. RADIATION DOSE REDUCTION: This exam was performed according to the departmental dose-optimization program which includes automated exposure control, adjustment of the mA and/or kV according to patient size and/or use of iterative reconstruction technique. CONTRAST:  OMNIPAQUE  IOHEXOL  350 MG/ML SOLN COMPARISON:  04/19/2022 FINDINGS: Cardiovascular: Satisfactory opacification of the pulmonary arteries to the segmental level. No evidence of pulmonary embolism. Normal heart size. No pericardial effusion. Aortic atherosclerosis and coronary artery calcifications Mediastinum/Nodes: No enlarged mediastinal, hilar, or axillary lymph  nodes. Thyroid  gland, trachea, and esophagus demonstrate no significant findings. Lungs/Pleura: Emphysema with diffuse bronchial wall thickening. Trace right pleural effusion. Interlobular septal thickening is identified along with ground-glass attenuation. This has a lower lung zone predominance within the right upper lobe there is increased peripheral  ground-glass and airspace densities, axial image 56 of the lung windows. Peripheral ground-glass attenuation with subpleural consolidation noted in the right lower lobe similar appearance of chronic subpleural scarring within the left lower lobe corresponding to post treatment changes as described on 12/21/2022. Small scattered lung nodules are again noted and appear unchanged compared with the prior exam the largest is in the lateral right upper lobe appears ground-glass in attenuation measuring 5 mm, axial image 33. Upper Abdomen: No acute abnormality.  [place]. Musculoskeletal: No chest wall abnormality. No acute or significant osseous findings. Review of the MIP images confirms the above findings. IMPRESSION: 1. No evidence for acute pulmonary embolus. 2. Small right pleural effusion and interstitial edema. Correlate for signs or symptoms of CHF 3. Increased peripheral ground-glass and airspace densities within the right upper lobe and right lower lobe. Differential considerations include multifocal pneumonia versus asymmetric alveolar edema. 4. Stable appearance of chronic subpleural scarring within the left lower lobe corresponding to post treatment changes as described on 12/21/2022. 5. Stable appearance of small scattered lung nodules. 6. Cholelithiasis. 7. Coronary artery calcifications. 8. Aortic Atherosclerosis (ICD10-I70.0) and Emphysema (ICD10-J43.9). Electronically Signed   By: Waddell Calk M.D.   On: 10/19/2023 06:07   DG Chest Port 1 View Result Date: 10/18/2023 CLINICAL DATA:  73 year old female with possible sepsis. Shortness of breath. EXAM: PORTABLE CHEST 1 VIEW COMPARISON:  Chest radiograph 10/09/2023 and earlier. FINDINGS: Portable AP upright view at 0641 hours. New like truck device projects over the left upper chest, might be external but etiology/significance unclear. Lower lung volumes. Accounting for this heart size and mediastinal contours remain within normal limits. No  pneumothorax or pulmonary edema. Increased streaky bilateral lower lung opacity. No convincing consolidation or effusion. No acute osseous abnormality identified. Paucity of bowel gas. IMPRESSION: 1. Lower lung volumes with increased streaky bilateral lower lung opacity indeterminate for atelectasis versus infection. 2. New electronic device projecting over the left upper chest. Electronically Signed   By: VEAR Hurst M.D.   On: 10/18/2023 06:49    ASSESSMENT: Iron  deficiency anemia.  PLAN:    Iron  deficiency anemia: Patient noted to have decreased hemoglobin iron  stores during her hospital admission and was referred for IV iron .  She has not received any treatment since October 26, 2016.  Patient will return to clinic 5 times over the next 1 to 2 weeks to receive 200 mg IV Venofer .  She would then return to clinic in 4 months with repeat laboratory work, further evaluation, and continuation of treatment if needed. 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  She completed treatment in approximately May 2021.  Patient's  most recent CT scan on October 19, 2023 did not reveal any evidence of recurrent or progressive disease.  Patient will require repeat imaging in July 2026.   Shortness of breath: Chronic and unchanged.  Patient has been instructed to continue follow-up with pulmonary as indicated. Weakness and fatigue: Multifactorial.  IV iron  as above.  I spent a total of 30 minutes reviewing chart data, face-to-face evaluation with the patient, counseling and coordination of care as detailed above.     Evalene JINNY Reusing, MD  11/12/2023 2:28 PM

## 2023-11-12 NOTE — Telephone Encounter (Signed)
 Copied from CRM [id number]. Topic: Clinical - Medical Advice >> Nov 12, 2023  3:13 PM Tiffini S wrote: Reason for CRM: Alfredia / physical therapist with [facility] [phone] called stating that the patient is feeling better from COVID- patient is no longer running fever, SOB

## 2023-11-12 NOTE — Progress Notes (Signed)
 Patient is fatigued and very weak. She can hardly function. Shortness of breath.  Headache for about 3 weeks.

## 2023-11-13 ENCOUNTER — Other Ambulatory Visit: Payer: Self-pay | Admitting: Family Medicine

## 2023-11-13 ENCOUNTER — Encounter: Payer: Self-pay | Admitting: Oncology

## 2023-11-13 DIAGNOSIS — F331 Major depressive disorder, recurrent, moderate: Secondary | ICD-10-CM

## 2023-11-14 ENCOUNTER — Inpatient Hospital Stay

## 2023-11-14 VITALS — BP 139/53 | HR 81 | Temp 98.5°F | Resp 18

## 2023-11-14 DIAGNOSIS — J9601 Acute respiratory failure with hypoxia: Secondary | ICD-10-CM | POA: Diagnosis not present

## 2023-11-14 DIAGNOSIS — I10 Essential (primary) hypertension: Secondary | ICD-10-CM | POA: Diagnosis not present

## 2023-11-14 DIAGNOSIS — J189 Pneumonia, unspecified organism: Secondary | ICD-10-CM | POA: Diagnosis not present

## 2023-11-14 DIAGNOSIS — D509 Iron deficiency anemia, unspecified: Secondary | ICD-10-CM | POA: Diagnosis not present

## 2023-11-14 DIAGNOSIS — I4891 Unspecified atrial fibrillation: Secondary | ICD-10-CM | POA: Diagnosis not present

## 2023-11-14 DIAGNOSIS — Z79620 Long term (current) use of immunosuppressive biologic: Secondary | ICD-10-CM | POA: Diagnosis not present

## 2023-11-14 DIAGNOSIS — F32A Depression, unspecified: Secondary | ICD-10-CM | POA: Diagnosis not present

## 2023-11-14 DIAGNOSIS — Z79899 Other long term (current) drug therapy: Secondary | ICD-10-CM | POA: Diagnosis not present

## 2023-11-14 DIAGNOSIS — J432 Centrilobular emphysema: Secondary | ICD-10-CM | POA: Diagnosis not present

## 2023-11-14 DIAGNOSIS — E119 Type 2 diabetes mellitus without complications: Secondary | ICD-10-CM | POA: Diagnosis not present

## 2023-11-14 DIAGNOSIS — N179 Acute kidney failure, unspecified: Secondary | ICD-10-CM | POA: Diagnosis not present

## 2023-11-14 DIAGNOSIS — A419 Sepsis, unspecified organism: Secondary | ICD-10-CM | POA: Diagnosis not present

## 2023-11-14 MED ORDER — IRON SUCROSE 20 MG/ML IV SOLN
200.0000 mg | Freq: Once | INTRAVENOUS | Status: AC
  Administered 2023-11-14: 200 mg via INTRAVENOUS
  Filled 2023-11-14: qty 10

## 2023-11-14 NOTE — Patient Instructions (Signed)

## 2023-11-14 NOTE — Telephone Encounter (Signed)
 Requested medication (s) are due for refill today: yes   Requested medication (s) are on the active medication list: yes   Last refill:  05/14/23 # 90 with 1 refill   Future visit scheduled: yes   Notes to clinic:  Please review for refill. Refill not delegated protocol.     Requested Prescriptions  Pending Prescriptions Disp Refills   ARIPiprazole  (ABILIFY ) 5 MG tablet [Pharmacy Med Name: ARIPiprazole  5 MG Oral Tablet] 90 tablet 0    Sig: Take 1 tablet by mouth once daily     Not Delegated - Psychiatry:  Antipsychotics - Second Generation (Atypical) - aripiprazole  Failed - 11/14/2023  5:55 PM      Failed - This refill cannot be delegated      Failed - Lipid Panel in normal range within the last 12 months    Cholesterol  Date Value Ref Range Status  08/27/2023 190 <200 mg/dL Final   LDL Cholesterol (Calc)  Date Value Ref Range Status  08/27/2023 104 (H) mg/dL (calc) Final    Comment:    Reference range: <100 . Desirable range <100 mg/dL for primary prevention;   <70 mg/dL for patients with CHD or diabetic patients  with > or = 2 CHD risk factors. SABRA LDL-C is now calculated using the Martin-Hopkins  calculation, which is a validated novel method providing  better accuracy than the Friedewald equation in the  estimation of LDL-C.  Elizabeth [name] et al. Elizabeth [name]. 7986;689(80): 2061-2068  (http://education.[facility].com/faq/FAQ164)    HDL  Date Value Ref Range Status  08/27/2023 62 > OR = 50 mg/dL Final   Triglycerides  Date Value Ref Range Status  08/27/2023 137 <150 mg/dL Final         Failed - CBC within normal limits and completed in the last 12 months    WBC  Date Value Ref Range Status  11/11/2023 10.4 4.0 - 10.5 K/uL Final   RBC  Date Value Ref Range Status  11/11/2023 3.94 3.87 - 5.11 MIL/uL Final   Hemoglobin  Date Value Ref Range Status  11/11/2023 10.3 (L) 12.0 - 15.0 g/dL Final   HGB  Date Value Ref Range Status  05/06/2014 13.2 12.0 - 16.0 g/dL  Final   HCT  Date Value Ref Range Status  11/11/2023 33.0 (L) 36.0 - 46.0 % Final  05/06/2014 40.0 35.0 - 47.0 % Final   MCHC  Date Value Ref Range Status  11/11/2023 31.2 30.0 - 36.0 g/dL Final   [facility]  Date Value Ref Range Status  11/11/2023 26.1 26.0 - 34.0 pg Final   MCV  Date Value Ref Range Status  11/11/2023 83.8 80.0 - 100.0 fL Final  05/06/2014 84 80 - 100 fL Final   No results found for: PLTCOUNTKUC, LABPLAT, POCPLA RDW  Date Value Ref Range Status  11/11/2023 14.0 11.5 - 15.5 % Final  05/06/2014 13.4 11.5 - 14.5 % Final         Failed - CMP within normal limits and completed in the last 12 months    Albumin  Date Value Ref Range Status  11/05/2023 3.6 3.5 - 5.0 g/dL Final   Alkaline Phosphatase  Date Value Ref Range Status  11/05/2023 98 38 - 126 U/L Final   Alkaline phosphatase (APISO)  Date Value Ref Range Status  08/27/2023 104 37 - 153 U/L Final   ALT  Date Value Ref Range Status  11/05/2023 22 0 - 44 U/L Final   AST  Date Value Ref Range Status  11/05/2023 39 15 - 41 U/L Final   BUN  Date Value Ref Range Status  11/05/2023 9 8 - 23 mg/dL Final  97/95/7983 6 (L) 7 - 18 mg/dL Final   Calcium   Date Value Ref Range Status  11/05/2023 8.7 (L) 8.9 - 10.3 mg/dL Final   Calcium , Total  Date Value Ref Range Status  05/06/2014 8.8 8.5 - 10.1 mg/dL Final   CO2  Date Value Ref Range Status  11/05/2023 27 22 - 32 mmol/L Final   Co2  Date Value Ref Range Status  05/06/2014 27 21 - 32 mmol/L Final   Bicarbonate  Date Value Ref Range Status  10/19/2023 20.1 20.0 - 28.0 mmol/L Final   Creat  Date Value Ref Range Status  08/27/2023 0.81 0.60 - 1.00 mg/dL Final   Creatinine, Ser  Date Value Ref Range Status  11/05/2023 0.71 0.44 - 1.00 mg/dL Final   Creatinine, Urine  Date Value Ref Range Status  08/27/2023 229 20 - 275 mg/dL Final   Glucose  Date Value Ref Range Status  05/06/2014 101 (H) 65 - 99 mg/dL Final   Glucose, Bld   Date Value Ref Range Status  11/05/2023 134 (H) 70 - 99 mg/dL Final    Comment:    Glucose reference range applies only to samples taken after fasting for at least 8 hours.   Glucose-Capillary  Date Value Ref Range Status  10/22/2023 132 (H) 70 - 99 mg/dL Final    Comment:    Glucose reference range applies only to samples taken after fasting for at least 8 hours.   Potassium  Date Value Ref Range Status  11/05/2023 3.5 3.5 - 5.1 mmol/L Final  05/06/2014 3.5 3.5 - 5.1 mmol/L Final   Sodium  Date Value Ref Range Status  11/05/2023 139 135 - 145 mmol/L Final  05/06/2014 140 136 - 145 mmol/L Final   Total Bilirubin  Date Value Ref Range Status  11/05/2023 0.9 0.0 - 1.2 mg/dL Final   Protein, ur  Date Value Ref Range Status  06/24/2023 1+ (A) NEGATIVE Final   Total Protein  Date Value Ref Range Status  11/05/2023 6.9 6.5 - 8.1 g/dL Final   EGFR (African American)  Date Value Ref Range Status  05/06/2014 >60 >53mL/min Final   GFR calc Af Amer  Date Value Ref Range Status  10/20/2019 >60 >60 mL/min Final   eGFR  Date Value Ref Range Status  09/18/2022 85 > OR = 60 mL/min/1.51m2 Final   EGFR (Non-African Amer.)  Date Value Ref Range Status  05/06/2014 >60 >35mL/min Final    Comment:    eGFR values <90mL/min/1.73 m2 may be an indication of chronic kidney disease (CKD). Calculated eGFR, using the MRDR Study equation, is useful in  patients with stable renal function. The eGFR calculation will not be reliable in acutely ill patients when serum creatinine is changing rapidly. It is not useful in patients on dialysis. The eGFR calculation may not be applicable to patients at the low and high extremes of body sizes, pregnant women, and vegetarians.    GFR, Estimated  Date Value Ref Range Status  11/05/2023 >60 >60 mL/min Final    Comment:    (NOTE) Calculated using the CKD-EPI Creatinine Equation (2021)          Passed - TSH in normal range and within 360  days    TSH  Date Value Ref Range Status  11/11/2023 3.880 0.450 - 4.500 uIU/mL Final  Passed - Completed PHQ-2 or PHQ-9 in the last 360 days      Passed - Last BP in normal range    BP Readings from Last 1 Encounters:  11/14/23 (!) 139/53         Passed - Last Heart Rate in normal range    Pulse Readings from Last 1 Encounters:  11/14/23 81         Passed - Valid encounter within last 6 months    Recent Outpatient Visits           2 weeks ago Weakness generalized   [place] [facility] [place], Marsa PARAS, DO   1 month ago Atrial fibrillation with R[facility])   [place] [facility] [place] Marsa PARAS, DO   1 month ago Weakness   [place] [facility] Everlene Parris LABOR, MD   2 months ago Annual physical exam    [facility] [place] Marsa PARAS, DO   4 months ago Type 2 diabetes mellitus with other specified complication, without long-term current use of insulin  [facility])    [facility] [place], Marsa PARAS, [place]

## 2023-11-15 ENCOUNTER — Other Ambulatory Visit: Payer: Self-pay

## 2023-11-15 NOTE — Transitions of Care (Post Inpatient/ED Visit) (Signed)
 Transition of Care week 4  Visit Note  11/15/2023  Name: Elizabeth [name] MRN: [id number]          DOB: October 04, 1950  Situation: Patient enrolled in [facility] 30-day program. Visit completed with patient by telephone.   Background:   Initial Transition Care Management Follow-up Telephone Call    Past Medical History:  Diagnosis Date   Allergy    Anxiety    B12 deficiency    Carpal tunnel syndrome    Centrilobular emphysema (HCC)    COPD (chronic obstructive pulmonary disease) (HCC)    Depression    Genetic testing    Common Cancers panel (47 genes) @ [facility] - No pathogenic mutations detected   HLD (hyperlipidemia)    Hyperlipidemia    Hypertension    Iron  deficiency anemia 10/16/2016   Lipoma of skin    Lung nodule    Normocytic anemia 10/16/2016   QT prolongation    Substance abuse (HCC)     Assessment: Patient Reported Symptoms: Cognitive Cognitive Status: No symptoms reported, Able to follow simple commands, Alert and oriented to person, place, and time, Insightful and able to interpret abstract concepts, Normal speech and language skills      Neurological Neurological Review of Symptoms: No symptoms reported    HEENT HEENT Symptoms Reported: No symptoms reported      Cardiovascular Cardiovascular Symptoms Reported: No symptoms reported    Respiratory Respiratory Symptoms Reported: No symptoms reported Other Respiratory Symptoms: No acute shortness of breath  same as normal patient stated. Additional Respiratory Details: CPAP with oxygen at night. 97% patient reports this am reading. Respiratory Management Strategies: Activity, Adequate rest, Coping strategies, CPAP, Medication therapy, Oxygen therapy Respiratory Self-Management Outcome: 4 (good)  Endocrine Endocrine Symptoms Reported: No symptoms reported    Gastrointestinal Gastrointestinal Symptoms Reported: No symptoms reported      Genitourinary Genitourinary Symptoms Reported: Other Additional  Genitourinary Details: Dark urine after Fe infusion yesterday on 11/14/23.    Integumentary Integumentary Symptoms Reported: No symptoms reported    Musculoskeletal Musculoskelatal Symptoms Reviewed: No symptoms reported        Psychosocial Psychosocial Symptoms Reported: Other Additional Psychological Details: Patient stated she is feeling much better on this issue and has decided I can't live in fear the rest of my life that this will happen again but did say she would keep on better contact with providers and be more vocal about not feeling well when/if it occurs. Behavioral Management Strategies: Medication therapy, Support system, Adequate rest Behavioral Health Self-Management Outcome: 4 (good) Behavioral Health Comment: I am doing much better Major Change/Loss/Stressor/Fears (CP): Medical condition, self Techniques to Cope with Loss/Stress/Change: Medication, Diversional activities Quality of Family Relationships: helpful, involved, supportive Do you feel physically threatened by others?: No   Vitals:   11/15/23 1422  SpO2: 97%    Medications Reviewed Today     Reviewed by Carolee Heron NOVAK, RN (Case Manager) on 11/15/23 at 1411  Med List Status: <None>   Medication Order Taking? Sig Documenting Provider Last Dose Status Informant  albuterol  (VENTOLIN  HFA) 108 (90 Base) MCG/ACT inhaler [id number] Yes Inhale 2 puffs into the lungs every 6 (six) hours as needed for wheezing or shortness of breath. [place] Marsa PARAS, DO  Active Child  apixaban  (ELIQUIS ) 5 MG TABS tablet [id number] Yes Take 1 tablet (5 mg total) by mouth 2 (two) times daily. Vivienne Nest M, PA-C  Active   ARIPiprazole  (ABILIFY ) 5 MG tablet [id number] Yes Take 1 tablet by mouth once daily Karamalegos,  Marsa PARAS, DO  Active   benzonatate  (TESSALON ) 100 MG capsule [id number] Yes Take 1 capsule (100 mg total) by mouth 3 (three) times daily as needed for cough. Gladis Elsie BROCKS, PA-C  Active   busPIRone   (BUSPAR ) 5 MG tablet [id number] Yes Take 1 tablet (5 mg total) by mouth 3 (three) times daily. Vivienne Nest M, PA-C  Active   cyanocobalamin  (VITAMIN B12) 1000 MCG/ML injection [id number] Yes Inject 1 mL (1,000 mcg total) into the muscle every 30 (thirty) days. [place] Robes, PA-C  Active Child  fluticasone  (FLONASE ) 50 MCG/ACT nasal spray [id number] Yes Place 2 sprays into both nostrils daily. [place] Marsa PARAS, DO  Active   furosemide  (LASIX ) 40 MG tablet [id number] Yes Take 40 mg by mouth.  Take 1 tablet (40 mg total) by mouth once daily [provider]  Active Child  ipratropium (ATROVENT ) 0.06 % nasal spray [id number] Yes Place 2 sprays into both nostrils 4 (four) times daily. Bernardino Ditch, NP  Active   losartan  (COZAAR ) 50 MG tablet [id number] Yes Take 1 tablet by mouth once daily Ed[place] Marsa PARAS, DO  Active Child  MAGNESIUM -OXIDE 400 (240 Mg) MG tablet [id number] Yes Take 1 tablet by mouth once daily Ed[place] Marsa PARAS, DO  Active Child  metoprolol  succinate (TOPROL -XL) 25 MG 24 hr tablet [id number] Yes Take 1 tablet (25 mg total) by mouth daily. Take with or immediately following a meal. [place], Marsa PARAS, DO  Active Child  Multiple Vitamin (MULTI-VITAMINS) TABS [id number] Yes Take by mouth. [provider]  Active Child  Nebulizers (PARI PRONEB MAX [facility] SPRINT) MISC [id number] Yes  [provider]  Active Child  omeprazole  (PRILOSEC) 20 MG capsule [id number] Yes TAKE 1 CAPSULE BY MOUTH ONCE DAILY BEFORE BREAKFAST Karamalegos, Marsa PARAS, DO  Active Child  potassium chloride  SA (KLOR-CON  M) 20 MEQ tablet [id number] Yes Take 1 tablet by mouth once daily Ed[place] Marsa PARAS, DO  Active Child  rosuvastatin  (CRESTOR ) 40 MG tablet [id number] Yes Take 40 mg by mouth daily. [provider]  Active Child  RYBELSUS  3 MG TABS [id number]  Take 1 tablet (3 mg total) by mouth daily.  Patient not taking: Reported on 11/15/2023   [place] Marsa PARAS, DO  Active Child           Med Note [facility], ELIZABETH A   Fri Oct 18, 2023  7:23 AM) NOT STARTED  saccharomyces boulardii (FLORASTOR) 250 MG capsule [id number] Yes Take 1 capsule (250 mg total) by mouth 2 (two) times daily. Vivienne Nest HERO, PA-C  Active   SPIRIVA  HANDIHALER 18 MCG inhalation capsule [id number] Yes Place 1 capsule (18 mcg total) into inhaler and inhale daily. [place] Marsa PARAS, DO  Active Child  SYMBICORT 160-4.5 MCG/ACT inhaler [id number] Yes Inhale 2 puffs into the lungs. [provider]  Active Child  venlafaxine  XR (EFFEXOR -XR) 75 MG 24 hr capsule [id number] Yes TAKE 3 CAPSULES BY MOUTH ONCE DAILY WITH BREAKFAST Karamalegos, Marsa PARAS, DO  Active             Goals Addressed             This Visit's Progress    VBCI Transitions of Care (TOC) Care Plan       (All reviewed and or updated on 11/15/23 on telephonic call with patient).   Problems:  Recent Hospitalization for treatment of Pulmonary Disease Knowledge Deficit Related to Respiratory Failure/pneumonia  Goal:  Over the next 30 days, the patient will not experience hospital readmission  Interventions:  Transitions of Care:  No changes to SDOH or Allergy Reviews.  Doctor Visits  - discussed the importance of doctor visits Completed PCP follow up on 10/29/23. Incentive spirometry at home;reviewed with patient.  Troubleshoot use of DME in the home Fall prevention around oxygen tubing.  Only using oxygen as bed time now.  DME HOME oxygen via [facility]:  Discussed oxygen safety/fire safety and to let local fire station know of oxygen use at home.  Reviewed medications review, SDOH, allergies, Review of systems assessment, functional status, depression and anxiety screening Anxiety is greatly improved per patient, though it still has moments daily, mostly around her feelings of trauma from event that brought her to hospital; I can't breathe.  11/15/23: Patient states  feeling much better in this issue and has less intrusive thoughts and anticipatory worry.  Reviewed recent ED/UC care visit. Started prednisone  for inflammatory URI, no other medication changes.  Discussed this may raise blood sugars and to monitor for this.  Wishes to have a pulmonary provider closer to home, now at [facility], encouraged to ask PCP for referral.  Home Health has been coming via [facility] coming twice a week. Progressing, 2 more visits then will be released per patient.  Patient and therapist, per patient stated, feels she is stronger, more steady than before.  Patient Self Care Activities:  Attend all scheduled provider appointments Call pharmacy for medication refills 3-7 days in advance of running out of medications Call provider office for new concerns or questions  Notify RN Care Manager of TOC call rescheduling needs Participate in Transition of Care Program/Attend TOC scheduled calls Perform all self care activities independently  Take medications as prescribed   Consider referral to LCSW if anxiety does not decrease, to reassess next call or speak to PCP again about this (Spoke to PCP on 11/07/23).  Consider some relaxation techniques.  Patient states more knowledge about her condition and what to call provider for has helped some with anxiety Family has a rescue plan in place.  Referral was made via PCP to [facility] Care management to start 11/25/23.  This was reviewed and discussed with patient for any LCSW outreach/resources for anxiety issues if needed.  Declines referral at this time as feels she is improving and processing.  CCM RN and LCSW have appointments scheduled after 30 day program.  Continue to monitor SpO2, weights, blood pressure, blood sugars at home.  Keep log to take to provider Include spirometry readings and progression.  Contact PCP regarding pulmonary referral closer to home. (Pending?)  Plan:  Continue with current plan.  Telephonic appointment  scheduled with patient for next week 11/21/23 at 2 pm.           Recommendation:   Continue Current Plan of Care  Follow Up Plan:   Telephone follow-up in 1 week  Bing Edison MSN, RN RN Case Manager [place]  VBCI-Population Health Office Hours M-F [phone] Direct Dial: [phone] Main Phone [phone]  Fax: [phone] [email or web address]

## 2023-11-15 NOTE — Patient Instructions (Signed)
 Visit Information  Thank you for taking time to visit with me today. Please don't hesitate to contact me if I can be of assistance to you before our next scheduled telephone appointment.  Our next appointment is by telephone on 11/21/23 at 2 pm  Following is a copy of your care plan:   Goals Addressed             This Visit's Progress    VBCI Transitions of Care (TOC) Care Plan       (All reviewed and or updated on 11/15/23 on telephonic call with patient).   Problems:  Recent Hospitalization for treatment of Pulmonary Disease Knowledge Deficit Related to Respiratory Failure/pneumonia  Goal:  Over the next 30 days, the patient will not experience hospital readmission  Interventions:  Transitions of Care:  No changes to SDOH or Allergy Reviews.  Doctor Visits  - discussed the importance of doctor visits Completed PCP follow up on 10/29/23. Incentive spirometry at home;reviewed with patient.  Troubleshoot use of DME in the home Fall prevention around oxygen tubing.  Only using oxygen as bed time now.  DME HOME oxygen via [facility]:  Discussed oxygen safety/fire safety and to let local fire station know of oxygen use at home.  Reviewed medications review, SDOH, allergies, Review of systems assessment, functional status, depression and anxiety screening Anxiety is greatly improved per patient, though it still has moments daily, mostly around her feelings of trauma from event that brought her to hospital; I can't breathe.  11/15/23: Patient states feeling much better in this issue and has less intrusive thoughts and anticipatory worry.  Reviewed recent ED/UC care visit. Started prednisone  for inflammatory URI, no other medication changes.  Discussed this may raise blood sugars and to monitor for this.  Wishes to have a pulmonary provider closer to home, now at [facility], encouraged to ask PCP for referral.  Home Health has been coming via [facility] coming twice a week. Progressing,  2 more visits then will be released per patient.  Patient and therapist, per patient stated, feels she is stronger, more steady than before.  Patient Self Care Activities:  Attend all scheduled provider appointments Call pharmacy for medication refills 3-7 days in advance of running out of medications Call provider office for new concerns or questions  Notify RN Care Manager of TOC call rescheduling needs Participate in Transition of Care Program/Attend TOC scheduled calls Perform all self care activities independently  Take medications as prescribed   Consider referral to LCSW if anxiety does not decrease, to reassess next call or speak to PCP again about this (Spoke to PCP on 11/07/23).  Consider some relaxation techniques.  Patient states more knowledge about her condition and what to call provider for has helped some with anxiety Family has a rescue plan in place.  Referral was made via PCP to [facility] Care management to start 11/25/23.  This was reviewed and discussed with patient for any LCSW outreach/resources for anxiety issues if needed.  Declines referral at this time as feels she is improving and processing.  CCM RN and LCSW have appointments scheduled after 30 day program.  Continue to monitor SpO2, weights, blood pressure, blood sugars at home.  Keep log to take to provider Include spirometry readings and progression.  Contact PCP regarding pulmonary referral closer to home. (Pending?)  Plan:  Continue with current plan.  Telephonic appointment scheduled with patient for next week 11/21/23 at 2 pm.  Patient verbalizes understanding of instructions and care plan provided today and agrees to view in MyChart. Active MyChart status and patient understanding of how to access instructions and care plan via MyChart confirmed with patient.     Telephone follow up appointment with care management team member scheduled for: 11/21/22 at 2 pm.   Please call the care guide team  at [phone] if you need to cancel or reschedule your appointment.   Please call the Suicide and Crisis Lifeline: 988 call 1-800-273-TALK (toll free, 24 hour hotline) call 911 if you are experiencing a Mental Health or Behavioral Health Crisis or need someone to talk to.   Bing Edison MSN, RN RN Case Sales executive Health  VBCI-Population Health Office Hours M-F [phone] Direct Dial: [phone] Main Phone [phone]  Fax: [phone] [email or web address]

## 2023-11-18 DIAGNOSIS — E119 Type 2 diabetes mellitus without complications: Secondary | ICD-10-CM | POA: Diagnosis not present

## 2023-11-18 DIAGNOSIS — A419 Sepsis, unspecified organism: Secondary | ICD-10-CM | POA: Diagnosis not present

## 2023-11-18 DIAGNOSIS — F32A Depression, unspecified: Secondary | ICD-10-CM | POA: Diagnosis not present

## 2023-11-18 DIAGNOSIS — J432 Centrilobular emphysema: Secondary | ICD-10-CM | POA: Diagnosis not present

## 2023-11-18 DIAGNOSIS — J189 Pneumonia, unspecified organism: Secondary | ICD-10-CM | POA: Diagnosis not present

## 2023-11-18 DIAGNOSIS — N179 Acute kidney failure, unspecified: Secondary | ICD-10-CM | POA: Diagnosis not present

## 2023-11-18 DIAGNOSIS — I10 Essential (primary) hypertension: Secondary | ICD-10-CM | POA: Diagnosis not present

## 2023-11-18 DIAGNOSIS — I4891 Unspecified atrial fibrillation: Secondary | ICD-10-CM | POA: Diagnosis not present

## 2023-11-18 DIAGNOSIS — J9601 Acute respiratory failure with hypoxia: Secondary | ICD-10-CM | POA: Diagnosis not present

## 2023-11-19 ENCOUNTER — Inpatient Hospital Stay

## 2023-11-19 VITALS — BP 156/67 | HR 83 | Temp 97.9°F | Resp 18

## 2023-11-19 DIAGNOSIS — Z79620 Long term (current) use of immunosuppressive biologic: Secondary | ICD-10-CM | POA: Diagnosis not present

## 2023-11-19 DIAGNOSIS — D509 Iron deficiency anemia, unspecified: Secondary | ICD-10-CM

## 2023-11-19 DIAGNOSIS — Z79899 Other long term (current) drug therapy: Secondary | ICD-10-CM | POA: Diagnosis not present

## 2023-11-19 MED ORDER — IRON SUCROSE 20 MG/ML IV SOLN
200.0000 mg | Freq: Once | INTRAVENOUS | Status: AC
  Administered 2023-11-19: 200 mg via INTRAVENOUS
  Filled 2023-11-19: qty 10

## 2023-11-19 NOTE — Patient Instructions (Signed)

## 2023-11-21 ENCOUNTER — Ambulatory Visit

## 2023-11-21 VITALS — BP 153/81 | HR 96 | Temp 97.7°F | Resp 18

## 2023-11-21 DIAGNOSIS — D509 Iron deficiency anemia, unspecified: Secondary | ICD-10-CM

## 2023-11-21 DIAGNOSIS — E782 Mixed hyperlipidemia: Secondary | ICD-10-CM | POA: Diagnosis not present

## 2023-11-21 DIAGNOSIS — I2510 Atherosclerotic heart disease of native coronary artery without angina pectoris: Secondary | ICD-10-CM | POA: Diagnosis not present

## 2023-11-21 DIAGNOSIS — Z79899 Other long term (current) drug therapy: Secondary | ICD-10-CM | POA: Diagnosis not present

## 2023-11-21 DIAGNOSIS — E669 Obesity, unspecified: Secondary | ICD-10-CM | POA: Diagnosis not present

## 2023-11-21 DIAGNOSIS — J9611 Chronic respiratory failure with hypoxia: Secondary | ICD-10-CM | POA: Diagnosis not present

## 2023-11-21 DIAGNOSIS — G4733 Obstructive sleep apnea (adult) (pediatric): Secondary | ICD-10-CM | POA: Diagnosis not present

## 2023-11-21 DIAGNOSIS — I10 Essential (primary) hypertension: Secondary | ICD-10-CM | POA: Diagnosis not present

## 2023-11-21 DIAGNOSIS — K219 Gastro-esophageal reflux disease without esophagitis: Secondary | ICD-10-CM | POA: Diagnosis not present

## 2023-11-21 DIAGNOSIS — R600 Localized edema: Secondary | ICD-10-CM | POA: Diagnosis not present

## 2023-11-21 DIAGNOSIS — Z79620 Long term (current) use of immunosuppressive biologic: Secondary | ICD-10-CM | POA: Diagnosis not present

## 2023-11-21 DIAGNOSIS — I4891 Unspecified atrial fibrillation: Secondary | ICD-10-CM | POA: Diagnosis not present

## 2023-11-21 MED ORDER — IRON SUCROSE 20 MG/ML IV SOLN
200.0000 mg | Freq: Once | INTRAVENOUS | Status: AC
  Administered 2023-11-21: 200 mg via INTRAVENOUS
  Filled 2023-11-21: qty 10

## 2023-11-21 MED ORDER — SODIUM CHLORIDE 0.9% FLUSH
10.0000 mL | Freq: Once | INTRAVENOUS | Status: AC | PRN
  Administered 2023-11-21: 10 mL
  Filled 2023-11-21: qty 10

## 2023-11-21 NOTE — Patient Instructions (Signed)

## 2023-11-21 NOTE — Progress Notes (Signed)
 Patient tolerated Venofer  infusion well. Explained recommendation of 30 min post monitoring. Patient refused to wait post monitoring. Educated on what signs to watch for & to call with any concerns. No questions, discharged. Stable

## 2023-11-22 ENCOUNTER — Other Ambulatory Visit: Payer: Self-pay

## 2023-11-22 ENCOUNTER — Ambulatory Visit: Payer: Self-pay | Admitting: Family Medicine

## 2023-11-22 ENCOUNTER — Ambulatory Visit
Admission: EM | Admit: 2023-11-22 | Discharge: 2023-11-22 | Disposition: A | Discharge status: Home / Self Care | Attending: Family Medicine | Admitting: Family Medicine

## 2023-11-22 ENCOUNTER — Ambulatory Visit (INDEPENDENT_AMBULATORY_CARE_PROVIDER_SITE_OTHER)

## 2023-11-22 DIAGNOSIS — J441 Chronic obstructive pulmonary disease with (acute) exacerbation: Secondary | ICD-10-CM | POA: Diagnosis not present

## 2023-11-22 DIAGNOSIS — R0602 Shortness of breath: Secondary | ICD-10-CM | POA: Insufficient documentation

## 2023-11-22 DIAGNOSIS — R42 Dizziness and giddiness: Secondary | ICD-10-CM | POA: Diagnosis not present

## 2023-11-22 DIAGNOSIS — R509 Fever, unspecified: Secondary | ICD-10-CM | POA: Diagnosis not present

## 2023-11-22 DIAGNOSIS — J449 Chronic obstructive pulmonary disease, unspecified: Secondary | ICD-10-CM | POA: Diagnosis not present

## 2023-11-22 LAB — COMPREHENSIVE METABOLIC PANEL WITH GFR
ALT: 18 U/L (ref 0–44)
AST: 24 U/L (ref 15–41)
Albumin: 3.7 g/dL (ref 3.5–5.0)
Alkaline Phosphatase: 104 U/L (ref 38–126)
Anion gap: 12 (ref 5–15)
BUN: 10 mg/dL (ref 8–23)
CO2: 28 mmol/L (ref 22–32)
Calcium: 9.1 mg/dL (ref 8.9–10.3)
Chloride: 99 mmol/L (ref 98–111)
Creatinine, Ser: 0.86 mg/dL (ref 0.44–1.00)
GFR, Estimated: >60 mL/min (ref >60)
Glucose, Bld: 111 mg/dL — ABNORMAL HIGH (ref 70–99)
Potassium: 3.7 mmol/L (ref 3.5–5.1)
Sodium: 139 mmol/L (ref 135–145)
Total Bilirubin: 0.8 mg/dL (ref 0.0–1.2)
Total Protein: 7.1 g/dL (ref 6.5–8.1)

## 2023-11-22 LAB — CBC WITH DIFFERENTIAL/PLATELET
Abs Immature Granulocytes: 0.06 K/uL (ref 0.00–0.07)
Basophils Absolute: 0 K/uL (ref 0.0–0.1)
Basophils Relative: 0 %
Eosinophils Absolute: 0 K/uL (ref 0.0–0.5)
Eosinophils Relative: 0 %
HCT: 33 % — ABNORMAL LOW (ref 36.0–46.0)
Hemoglobin: 10.5 g/dL — ABNORMAL LOW (ref 12.0–15.0)
Immature Granulocytes: 1 %
Lymphocytes Relative: 25 %
Lymphs Abs: 2.2 K/uL (ref 0.7–4.0)
MCH: 26.4 pg (ref 26.0–34.0)
MCHC: 31.8 g/dL (ref 30.0–36.0)
MCV: 82.9 fL (ref 80.0–100.0)
Monocytes Absolute: 0.6 K/uL (ref 0.1–1.0)
Monocytes Relative: 7 %
Neutro Abs: 5.9 K/uL (ref 1.7–7.7)
Neutrophils Relative %: 67 %
Platelets: 236 K/uL (ref 150–400)
RBC: 3.98 MIL/uL (ref 3.87–5.11)
RDW: 15.8 % — ABNORMAL HIGH (ref 11.5–15.5)
WBC: 8.8 K/uL (ref 4.0–10.5)
nRBC: 0 % (ref 0.0–0.2)

## 2023-11-22 MED ORDER — PREDNISONE 50 MG PO TABS: 50.0000 mg | ORAL_TABLET | Freq: Every day | ORAL | 0 refills | Status: AC

## 2023-11-22 MED ORDER — DOXYCYCLINE HYCLATE 100 MG PO CAPS
100.0000 mg | ORAL_CAPSULE | Freq: Two times a day (BID) | ORAL | 0 refills | Status: AC
Start: 2023-11-22 — End: 2023-12-02

## 2023-11-22 MED ORDER — DOXYCYCLINE HYCLATE 100 MG PO CAPS: 100.0000 mg | ORAL_CAPSULE | Freq: Two times a day (BID) | ORAL | 0 refills | Status: DC

## 2023-11-22 NOTE — Patient Instructions (Signed)
 Visit Information  Thank you for taking time to visit with me today. Please don't hesitate to contact me if I can be of assistance to you before our next scheduled telephone appointment.  Our next appointment is by telephone on To be determined.   Following is a copy of your care plan:   Goals Addressed             This Visit's Progress    VBCI Transitions of Care (TOC) Care Plan   Worsening    (All reviewed and or updated on 11/15/23 on telephonic call with patient).   Problems:  Recent Hospitalization for treatment of Pulmonary Disease Knowledge Deficit Related to Respiratory Failure/pneumonia  Goal:  Over the next 30 days, the patient will not experience hospital readmission  Interventions:  Transitions of Care:  11/22/23: Telephonic outreach found patient feeling worse than last week I just feel so tired and exhausted with a little bit of a fever. Encouraged patient to seek assessment at [facility] as she felt like this was all similar feelings she had prior to waking up at night unable to breath.  Sp02 97% while on the phone. HR via pulse oximeter = 105 bpm.  Did not specificy temperature.  Daughter to take to [facility] now. (150 pm 11/22/23). RN CM will outreach again Monday or Tuesday and check for any admissions. Today, 11/22/23 marked 30 days without readmission so far.   Patient Self Care Activities:  Attend all scheduled provider appointments Call pharmacy for medication refills 3-7 days in advance of running out of medications Call provider office for new concerns or questions  Notify RN Care Manager of [facility] call rescheduling needs Participate in Transition of Care Program/Attend [facility] scheduled calls Perform all self care activities independently  Take medications as prescribed    Referral was made via PCP to [facility] Care management to start 11/25/23.  This was reviewed and discussed with patient for any LCSW outreach/resources for anxiety issues if needed.  Declines  referral at this time as feels she is improving and processing.  CCM RN and LCSW have appointments scheduled after 30 day program.   Continue to monitor SpO2, weights, blood pressure, blood sugars at home.  Keep log to take to provider  Plan:  Continue with current plan.   11/22/23 is day for no readmissions but patient is going to Urgent Care due to recurrent symptoms as prior to hospitalization.  This call was cut short due to patient condition and TOC will continue to monitor for admissions or needs with a follow up call next week.           Patient verbalizes understanding of instructions and care plan provided today and agrees to view in MyChart. Active MyChart status and patient understanding of how to access instructions and care plan via MyChart confirmed with patient.     The patient has been provided with contact information for the care management team and has been advised to call with any health related questions or concerns.  Follow up with provider re: Patient is doing to Urgent Care to seek evaluation of returning symptoms per patient stated.   TOC RN CM will monitor for UC/ED/Admissions and follow up with patient as soon as possible or close program if admitted.   Please call the care guide team at [phone] if you need to cancel or reschedule your appointment.   Please call the Suicide and Crisis Lifeline: 988 call 1-800-273-TALK (toll free, 24 hour hotline) call 911 if  you are experiencing a Mental Health or Behavioral Health Crisis or need someone to talk to.   Bing Edison MSN, RN RN Case Sales executive Health  VBCI-Population Health Office Hours M-F [phone] Direct Dial: [phone] Main Phone [phone]  Fax: [phone] [email or web address]

## 2023-11-22 NOTE — ED Provider Notes (Signed)
 [facility]    CSN: [id number] Arrival date & time: 11/22/23  1452      History   Chief Complaint Chief Complaint  Patient presents with   Shortness of Breath    HPI Elizabeth [name] is a 73 y.o. female.   HPI  History obtained from the patient and her daughter.  Elizabeth [name] presents for shortness of breath, cough and nasal congestion. When she walks she feels weak and dizzy.  She wears oxygen at night with her CAP. She doesn't think she needs it during the day.  She follows with pulmonology but have not seen him in 6 months. She has history of lung cancer but has been in remission.   She was admitted on July 18-23rd.  Patient doesn't recall the admission. Daughter states she was in respiratory arrest. She was on forced air for 3 days but didn't require a ventilator.  For the past 3 days she has been feeling weak again.  She has not had a fever but has been taking Tylenol .   She had COVID 2 weeks ago. She took the antiviral. She was placed on antibiotics after that for an upper respiratory infection.    Daughter took her blood pressure at home and it was 158/72. She took her blood pressure medications today.  Says she is upset an doesn't want to get septic again.    She saw her heart doctor yesterday.      Past Medical History:  Diagnosis Date   Allergy    Anxiety    B12 deficiency    Carpal tunnel syndrome    Centrilobular emphysema (HCC)    COPD (chronic obstructive pulmonary disease) (HCC)    Depression    Genetic testing    Common Cancers panel (47 genes) @ [facility] - No pathogenic mutations detected   HLD (hyperlipidemia)    Hyperlipidemia    Hypertension    Iron  deficiency anemia 10/16/2016   Lipoma of skin    Lung nodule    Normocytic anemia 10/16/2016   QT prolongation    Substance abuse (HCC)     Patient Active Problem List   Diagnosis Date Noted   Acute hypoxic respiratory failure (HCC) 10/18/2023   Severe sepsis (HCC) 10/18/2023   COPD  with acute exacerbation (HCC) 10/18/2023   CAP (community acquired pneumonia) 10/18/2023   Weakness generalized 10/09/2023   Type 2 diabetes mellitus with other specified complication (HCC) 10/06/2021   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) 07/04/2021   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) 07/04/2021   OSA on CPAP 06/26/2019   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 03/11/2018   Senile nuclear sclerosis, left 03/11/2018   Substance abuse (HCC)    Genetic testing    Pernicious anemia 11/27/2016   Normocytic anemia 10/16/2016   Iron  deficiency anemia 10/16/2016   Centrilobular emphysema (HCC)    Major depressive disorder, recurrent, moderate (HCC) 09/24/2016   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   Bilateral carpal tunnel syndrome 05/27/2015   Bilateral hand numbness 05/17/2015   Fracture of humerus, proximal, closed 03/23/2013   Lipoma of skin and subcutaneous tissue 12/26/2011   B12 deficiency 02/15/2011   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      Home Medications    Prior to Admission medications   Medication Sig Start Date End Date Taking? Authorizing Provider  albuterol  (VENTOLIN  HFA) 108 (90 Base) MCG/ACT inhaler Inhale 2 puffs into the lungs every 6 (six) hours as needed for wheezing or shortness of breath. 03/21/23  Yes Karamalegos, Marsa PARAS, DO  apixaban  (ELIQUIS ) 5 MG TABS tablet Take 1 tablet (5 mg total) by mouth 2 (two) times daily. 10/23/23  Yes  Vivienne Nest M, PA-C  ARIPiprazole  (ABILIFY ) 5 MG tablet Take 1 tablet by mouth once daily 11/15/23  Yes Karamalegos, Marsa PARAS, DO  benzonatate  (TESSALON ) 100 MG capsule Take 1 capsule (100 mg total) by mouth 3 (three) times daily as needed for cough. 11/09/23  Yes Gladis Elsie BROCKS, PA-C  busPIRone  (BUSPAR ) 5 MG tablet Take 1 tablet (5 mg total) by mouth 3 (three) times daily. 10/23/23  Yes Vivienne Nest HERO, PA-C  cyanocobalamin  (VITAMIN B12) 1000 MCG/ML injection Inject 1 mL (1,000 mcg total) into the muscle every 30 (thirty) days. 10/04/23  Yes Reyes, Roosvelt, PA-C  fluticasone  (FLONASE ) 50 MCG/ACT nasal spray Place 2 sprays into both nostrils daily. 10/29/23  Yes Karamalegos, Marsa PARAS, DO  furosemide  (LASIX ) 40 MG tablet Take 40 mg by mouth.  Take 1 tablet (40 mg total) by mouth once daily 08/28/23  Yes [provider]  ipratropium (ATROVENT ) 0.06 % nasal spray Place 2 sprays into both nostrils 4 (four) times daily. 11/05/23  Yes Bernardino Ditch, NP  losartan  (COZAAR ) 50 MG tablet Take 1 tablet by mouth once daily 10/15/23  Yes Karamalegos, Marsa PARAS, DO  MAGNESIUM -OXIDE 400 (240 Mg) MG tablet Take 1 tablet by mouth once daily 10/15/23  Yes Karamalegos, Alexander J, DO  metoprolol  succinate (TOPROL -XL) 25 MG 24 hr tablet Take 1 tablet (25 mg total) by mouth daily. Take with or immediately following a meal. 10/11/23  Yes Karamalegos, Marsa PARAS, DO  Multiple Vitamin (MULTI-VITAMINS) TABS Take by mouth.   Yes [provider]  Nebulizers (PARI PRONEB MAX LC SPRINT) MISC  09/23/23  Yes [provider]  omeprazole  (PRILOSEC) 20 MG capsule TAKE 1 CAPSULE BY MOUTH ONCE DAILY BEFORE BREAKFAST 09/10/23  Yes Karamalegos, Marsa PARAS, DO  potassium chloride  SA (KLOR-CON  M) 20 MEQ tablet Take 1 tablet by mouth once daily 09/10/23  Yes Karamalegos, Alexander J, DO  predniSONE  (DELTASONE ) 50 MG tablet Take 1 tablet (50 mg total) by mouth daily for 5 days. 11/22/23 11/27/23 Yes Shalissa Easterwood,  Arli Bree, DO  rosuvastatin  (CRESTOR ) 40 MG tablet Take 40 mg by mouth daily. 08/28/23 08/27/24 Yes [provider]  RYBELSUS  3 MG TABS Take 1 tablet (3 mg total) by mouth daily. 09/04/23  Yes Karamalegos, Marsa PARAS, DO  saccharomyces boulardii (FLORASTOR) 250 MG capsule Take 1 capsule (250 mg total) by mouth 2 (two) times daily. 10/23/23  Yes Vivienne Nest HERO, PA-C  SPIRIVA  HANDIHALER 18 MCG inhalation capsule Place 1 capsule (18 mcg total) into inhaler and inhale daily. 12/20/22  Yes Karamalegos, Marsa PARAS, DO  SYMBICORT 160-4.5 MCG/ACT inhaler Inhale 2 puffs into the lungs. 04/02/22  Yes [provider]  venlafaxine  XR (EFFEXOR -XR) 75 MG 24 hr capsule TAKE 3 CAPSULES BY MOUTH ONCE DAILY WITH BREAKFAST 10/25/23  Yes Karamalegos, Marsa PARAS, DO  doxycycline  (VIBRAMYCIN ) 100 MG capsule Take 1 capsule (100 mg total) by mouth 2 (two) times daily for 10 days. 11/22/23 12/02/23  Kriste Berth, DO    Family History Family History  Problem Relation Age of Onset   Ovarian cancer Mother 5       deceased 57   Breast cancer Mother 67   Stroke Father    Lung cancer Paternal Aunt    Lung cancer Cousin     Social History Social History   Tobacco Use   Smoking status: Every Day    Current packs/day: 0.50    Average packs/day: 0.5 packs/day for 52.0 years (26.0 ttl pk-yrs)    Types: Cigarettes   Smokeless tobacco: Never   Tobacco comments:    5 ciggarette a day--03/02/2020  Vaping Use   Vaping status: Some Days  Substance Use Topics   Alcohol use: No   Drug use: No     Allergies   Moxifloxacin, Levofloxacin, and Amoxicillin -pot clavulanate   Review of Systems Review of Systems: negative unless otherwise stated in HPI.      Physical Exam Triage Vital Signs ED Triage Vitals  Encounter Vitals Group     BP 11/22/23 1506 (!) 170/97     Girls Systolic BP Percentile --      Girls Diastolic BP Percentile --      Boys Systolic BP Percentile --      Boys Diastolic BP  Percentile --      Pulse Rate 11/22/23 1506 79     Resp 11/22/23 1506 16     Temp 11/22/23 1506 98.8 F (37.1 C)     Temp Source 11/22/23 1506 Oral     SpO2 11/22/23 1506 93 %     Weight 11/22/23 1505 182 lb (82.6 kg)     Height 11/22/23 1505 5' 2 (1.575 m)     Head Circumference --      Peak Flow --      Pain Score 11/22/23 1505 0     Pain Loc --      Pain Education --      Exclude from Growth Chart --    No data found.  Updated Vital Signs BP (!) 170/97 (BP Location: Right Arm)   Pulse 79   Temp 98.8 F (37.1 C) (Oral)   Resp 16   Ht 5' 2 (1.575 m)   Wt 82.6 kg   SpO2 93%   BMI 33.29 kg/m   Visual Acuity Right Eye Distance:   Left Eye Distance:   Bilateral Distance:    Right Eye Near:   Left Eye Near:    Bilateral Near:     Physical Exam GEN:     alert, non-toxic appearing elderly female tearful   HENT:  mucus membranes moist, clear nasal discharge EYES:   no scleral injection or discharge RESP:  no increased work of breathing, rhonchi bilaterally, no wheezing CVS:   regular rate and rhythm Skin:   warm and dry    UC Treatments / Results  Labs (all labs ordered are listed, but only abnormal results are displayed) Labs Reviewed  CBC WITH DIFFERENTIAL/PLATELET - Abnormal; Notable for the following components:      Result Value   Hemoglobin 10.5 (*)    HCT 33.0 (*)    RDW 15.8 (*)    All other components within normal limits  COMPREHENSIVE METABOLIC PANEL WITH GFR - Abnormal; Notable for the following components:  Glucose, Bld 111 (*)    All other components within normal limits    EKG   Radiology DG Chest 2 View Result Date: 11/22/2023 CLINICAL DATA:  Shortness of breath, fever, and dizziness EXAM: CHEST - 2 VIEW COMPARISON:  11/05/2023 FINDINGS: The heart size and mediastinal contours are within normal limits. Mild pulmonary hyperinflation again seen, consistent with COPD. Both lungs are clear. Mild to moderate thoracic spine degenerative  changes again seen. IMPRESSION: COPD. No active cardiopulmonary disease. Electronically Signed   By: Norleen DELENA Kil M.D.   On: 11/22/2023 16:33    Procedures Procedures (including critical care time)  Medications Ordered in UC Medications - No data to display  Initial Impression / Assessment and Plan / UC Course  I have reviewed the triage vital signs and the nursing notes.  Pertinent labs & imaging results that were available during my care of the patient were reviewed by me and considered in my medical decision making (see chart for details).       Pt is a 73 y.o. female who presents for 2-3 days of respiratory symptoms and fatigue. Malachi is afebrile. Satting 93% on room air. Overall pt is non-toxic appearing, well hydrated, without respiratory distress. Pulmonary exam is remarkable for rhonchi.  COVID deferred as she recently had COVID.  Chest x-ray ordered.  CBC and CMP obtained.   EKG personally interpreted by me; NSR with APCs but no acute ST or T wave changes; normal PR interval, changed compared to prior EKG from 11/05/23 AFIB has resolved.    CBC without leukopenia or leukocytosis, her anemia is stable.  CMP unremarkable.  Chest xray personally reviewed by me without focal pneumonia, pleural effusion, cardiomegaly or pneumothorax. Patient aware the radiologist has not read her xray and is comfortable with the preliminary read by me. Will review radiologist read when available and call patient if a change in plan is warranted.  Pt agreeable to this plan prior to discharge.   Treat acute COPD exacerbation steroids and antibiotics as below.  Continue using her inhalers/nebulizers.  Typical duration of symptoms discussed. Return and ED precautions given and patient and her daughter voiced understanding.   Discussed MDM, treatment plan and plan for follow-up with patient who agrees with plan.    Radiologist impression reviewed.   Final Clinical Impressions(s) / UC Diagnoses    Final diagnoses:  Shortness of breath  COPD exacerbation (HCC)     Discharge Instructions      Your white blood cell count which is a marker for inflammation infection is normal.  Your anemia is stable.  Your electrolytes were normal.  Your liver and kidney function is normal.  Your chest xray did not show evidence of pneumonia though the radiologist has not yet read it. If they find something that I didn't, I will call you.        ED Prescriptions     Medication Sig Dispense Auth. Provider   predniSONE  (DELTASONE ) 50 MG tablet Take 1 tablet (50 mg total) by mouth daily for 5 days. 5 tablet Vennie Salsbury, DO   doxycycline  (VIBRAMYCIN ) 100 MG capsule Take 1 capsule (100 mg total) by mouth 2 (two) times daily for 10 days. 20 capsule Dannae Kato, DO      PDMP not reviewed this encounter.       Necola Bluestein, DO 11/24/23 1042

## 2023-11-22 NOTE — Discharge Instructions (Addendum)
 Your white blood cell count which is a marker for inflammation infection is normal.  Your anemia is stable.  Your electrolytes were normal.  Your liver and kidney function is normal.  Your chest xray did not show evidence of pneumonia though the radiologist has not yet read it. If they find something that I didn't, I will call you.

## 2023-11-22 NOTE — ED Triage Notes (Signed)
 Pt states that she has some fatigue, dizziness, sob and fever. X2-3 days  Pt states that she was recently in the hospital for pneumonia and sepsis.   Pt states that she is running a fever at night that goes away.  Pt states that she wants to make sure her symptoms aren't coming back.

## 2023-11-22 NOTE — Transitions of Care (Post Inpatient/ED Visit) (Addendum)
 Transition of Care 5  Visit Note  11/22/2023  Name: Elizabeth [name] MRN: [id number]          DOB: Feb 28, 1951  Situation: Patient enrolled in [facility] 30-day program. Visit completed with patient by telephone.   Background: s/p discharge after acute respiratory failure.  11/22/23: TOC week 5 outreach call was successful but cut short due to patient reported symptoms and was encouraged to go to Urgent Care with her daughter who was present with her. TOC RN CM will monitor for any admissions and then follow up to close or extend follow up calls.   Initial Transition Care Management Follow-up Telephone Call    Past Medical History:  Diagnosis Date   Allergy    Anxiety    B12 deficiency    Carpal tunnel syndrome    Centrilobular emphysema (HCC)    COPD (chronic obstructive pulmonary disease) (HCC)    Depression    Genetic testing    Common Cancers panel (47 genes) @ [facility] - No pathogenic mutations detected   HLD (hyperlipidemia)    Hyperlipidemia    Hypertension    Iron  deficiency anemia 10/16/2016   Lipoma of skin    Lung nodule    Normocytic anemia 10/16/2016   QT prolongation    Substance abuse (HCC)     Goals Addressed             This Visit's Progress    VBCI Transitions of Care (TOC) Care Plan   Worsening    (All reviewed and or updated on 11/15/23 on telephonic call with patient).   Problems:  Recent Hospitalization for treatment of Pulmonary Disease Knowledge Deficit Related to Respiratory Failure/pneumonia  Goal:  Over the next 30 days, the patient will not experience hospital readmission  Interventions:  Transitions of Care:  11/22/23: Telephonic outreach found patient feeling worse than last week I just feel so tired and exhausted with a little bit of a fever. Encouraged patient to seek assessment at [facility] as she felt like this was all similar feelings she had prior to waking up at night unable to breath.  Sp02 97% while on the phone. HR via pulse oximeter =  105 bpm.  Did not specificy temperature.  Daughter to take to [facility] now. (150 pm 11/22/23). RN CM will outreach again Monday or Tuesday and check for any admissions. Today, 11/22/23 marked 30 days without readmission so far.   Patient Self Care Activities:  Attend all scheduled provider appointments Call pharmacy for medication refills 3-7 days in advance of running out of medications Call provider office for new concerns or questions  Notify RN Care Manager of [facility] call rescheduling needs Participate in Transition of Care Program/Attend [facility] scheduled calls Perform all self care activities independently  Take medications as prescribed    Referral was made via PCP to [facility] Care management to start 11/25/23.  This was reviewed and discussed with patient for any LCSW outreach/resources for anxiety issues if needed.  Declines referral at this time as feels she is improving and processing.  CCM RN and LCSW have appointments scheduled after 30 day program.   Continue to monitor SpO2, weights, blood pressure, blood sugars at home.  Keep log to take to provider  Plan:  Continue with current plan.   11/22/23 is day for no readmissions but patient is going to Urgent Care due to recurrent symptoms as prior to hospitalization.  This call was cut short due to patient condition and TOC will continue to monitor  for admissions or needs with a follow up call next week.           Assessment: Patient Reported Symptoms: Cognitive Cognitive Status: Alert and oriented to person, place, and time, Insightful and able to interpret abstract concepts, Normal speech and language skills      Neurological Neurological Review of Symptoms: No symptoms reported    HEENT HEENT Symptoms Reported: Not assessed      Cardiovascular Cardiovascular Symptoms Reported: No symptoms reported Does patient have uncontrolled Hypertension?: No    Respiratory Respiratory Symptoms Reported: Shortness of breath, Chest  tightness Additional Respiratory Details: Patient to go to Urgent Care with daughter, call cut short. Patient was not alone at the home. Respiratory Self-Management Outcome: 3 (uncertain)  Endocrine Endocrine Symptoms Reported: No symptoms reported Is patient diabetic?: Yes    Gastrointestinal Gastrointestinal Symptoms Reported: No symptoms reported      Genitourinary Genitourinary Symptoms Reported: Not assessed    Integumentary Integumentary Symptoms Reported: Not assessed    Musculoskeletal Musculoskelatal Symptoms Reviewed: Weakness Additional Musculoskeletal Details: Patient stated subjective feelings of being exhausted, and so tired        Psychosocial Psychosocial Symptoms Reported: Not assessed         Vitals:   11/22/23 1404  Pulse: (!) 105  SpO2: 97%   See note below.  Medications Reviewed Today   Medications were not reviewed in this encounter    11/22/23: TOC week 5 outreach call was successful but cut short due to patient reported symptoms and was encouraged to go to Urgent Care with her daughter who was present with her. TOC RN CM will monitor for any admissions and then follow up to close or extend follow up calls.   Recommendation:   Patient to go to Urgent Care with daughter to be evaluated for return of signs/symptoms she had prior to hospitalization for acute respiratory failure.   Follow Up Plan:   Telephone follow-up in 1 week This note was routed/faxed via EPIC to PCP.    Bing Edison MSN, RN RN Case Sales executive Health  VBCI-Population Health Office Hours M-F [phone] Direct Dial: [phone] Main Phone [phone]  Fax: [phone] [email or web address]

## 2023-11-25 ENCOUNTER — Other Ambulatory Visit: Payer: Self-pay | Admitting: *Deleted

## 2023-11-26 ENCOUNTER — Telehealth

## 2023-11-26 ENCOUNTER — Inpatient Hospital Stay

## 2023-11-26 VITALS — BP 163/64 | HR 81 | Temp 97.8°F | Resp 18

## 2023-11-26 DIAGNOSIS — Z79620 Long term (current) use of immunosuppressive biologic: Secondary | ICD-10-CM | POA: Diagnosis not present

## 2023-11-26 DIAGNOSIS — Z79899 Other long term (current) drug therapy: Secondary | ICD-10-CM | POA: Diagnosis not present

## 2023-11-26 DIAGNOSIS — D509 Iron deficiency anemia, unspecified: Secondary | ICD-10-CM | POA: Diagnosis not present

## 2023-11-26 MED ORDER — SODIUM CHLORIDE 0.9% FLUSH
10.0000 mL | Freq: Once | INTRAVENOUS | Status: AC | PRN
  Administered 2023-11-26: 10 mL
  Filled 2023-11-26: qty 10

## 2023-11-26 MED ORDER — IRON SUCROSE 20 MG/ML IV SOLN
200.0000 mg | Freq: Once | INTRAVENOUS | Status: AC
  Administered 2023-11-26: 200 mg via INTRAVENOUS
  Filled 2023-11-26: qty 10

## 2023-11-26 NOTE — Patient Outreach (Signed)
 Complex Care Management   Visit Note  11/26/2023  Name:  Fotini Lemus MRN: [id number] DOB: 10/21/1950  Situation: Referral received for Complex Care Management related to assistance coordinating  care and available resources due to increased care needs.  I obtained verbal consent from Parent.  Visit completed with Patient  and daughter on the phone  Patient confirmed that her condition has improved, she is getting stronger daily. Continues to follow up with medical appointments as scheduled. Confirms that her daughter takes of all of her care needs(provides transportation to medical appointments, medication reminders, meals and over all daily care). Patient declines need for social work involvement at this time. Patient currently active with TOC team.  Background:   Past Medical History:  Diagnosis Date   Allergy    Anxiety    B12 deficiency    Carpal tunnel syndrome    Centrilobular emphysema (HCC)    COPD (chronic obstructive pulmonary disease) (HCC)    Depression    Genetic testing    Common Cancers panel (47 genes) @ [facility] - No pathogenic mutations detected   HLD (hyperlipidemia)    Hyperlipidemia    Hypertension    Iron  deficiency anemia 10/16/2016   Lipoma of skin    Lung nodule    Normocytic anemia 10/16/2016   QT prolongation    Substance abuse (HCC)     Assessment: Patient Reported Symptoms:  Cognitive Cognitive Status: Alert and oriented to person, place, and time, Insightful and able to interpret abstract concepts, Normal speech and language skills Cognitive/Intellectual Conditions Management [RPT]: None reported or documented in medical history or problem list   Health Maintenance Behaviors: Annual physical exam, Healthy diet, Sleep adequate Healing Pattern: Average Health Facilitated by: Healthy diet, Prayer/meditation, Stress management  Neurological Neurological Review of Symptoms: No symptoms reported    HEENT HEENT Symptoms Reported: No symptoms  reported      Cardiovascular Cardiovascular Symptoms Reported: No symptoms reported    Respiratory Respiratory Symptoms Reported: Shortness of breath Other Respiratory Symptoms: I have had shortness of breath for years due to my COPD Takes medications, uses oxygen at night(only sleeps with oxygen)    Endocrine Endocrine Symptoms Reported: No symptoms reported Is patient diabetic?: Yes Is patient checking blood sugars at home?: Yes List most recent blood sugar readings, include date and time of day: 128 fasting 11/25/23 9:00am Endocrine Self-Management Outcome: 4 (good)  Gastrointestinal Gastrointestinal Symptoms Reported: No symptoms reported      Genitourinary Genitourinary Symptoms Reported: No symptoms reported Additional Genitourinary Details: urine colore has returned to normal    Integumentary Integumentary Symptoms Reported: No symptoms reported    Musculoskeletal Musculoskelatal Symptoms Reviewed: Weakness Additional Musculoskeletal Details: still having some weakness with all my body has gone through Taking iron   infusions through the cancer center-2 per week, this week will be the last week Musculoskeletal Management Strategies: Activity, Adequate rest, Coping strategies, Exercise, Routine screening Musculoskeletal Self-Management Outcome: 4 (good) Falls in the past year?: No Number of falls in past year: 1 or less Was there an injury with Fall?: No Fall Risk Category Calculator: 0 Patient Fall Risk Level: Low Fall Risk    Psychosocial Psychosocial Symptoms Reported: No symptoms reported Additional Psychological Details: At first did have some anxiety and depression, could not breath, gone to MD they tell me is getting beter, blood work coming back good, chest -xrays are coming back good, getting better and stronger daily Behavioral Management Strategies: Medication therapy, Support system, Adequate rest Behavioral Health Comment: Patient states  that sheis doing much  better, Major Change/Loss/Stressor/Fears (CP): Medical condition, self Behaviors When Feeling Stressed/Fearful: positive self talk, accepts help from family, address problems as they come Techniques to Cope with Loss/Stress/Change: Diversional activities, Medication Quality of Family Relationships: helpful, involved, supportive Do you feel physically threatened by others?: No    11/26/2023    PHQ2-9 Depression Screening   Little interest or pleasure in doing things Not at all  Feeling down, depressed, or hopeless Not at all  PHQ-2 - Total Score 0  Trouble falling or staying asleep, or sleeping too much    Feeling tired or having little energy    Poor appetite or overeating     Feeling bad about yourself - or that you are a failure or have let yourself or your family down    Trouble concentrating on things, such as reading the newspaper or watching television    Moving or speaking so slowly that other people could have noticed.  Or the opposite - being so fidgety or restless that you have been moving around a lot more than usual    Thoughts that you would be better off dead, or hurting yourself in some way    PHQ2-9 Total Score    If you checked off any problems, how difficult have these problems made it for you to do your work, take care of things at home, or get along with other people    Depression Interventions/Treatment      There were no vitals filed for this visit.  Medications Reviewed Today     Reviewed by Ermalinda Lenn HERO, LCSW (Social Worker) on 11/25/23 at 1426  Med List Status: <None>   Medication Order Taking? Sig Documenting Provider Last Dose Status Informant  albuterol  (VENTOLIN  HFA) 108 (90 Base) MCG/ACT inhaler [id number] Yes Inhale 2 puffs into the lungs every 6 (six) hours as needed for wheezing or shortness of breath. Edman Marsa PARAS, DO  Active Child  apixaban  (ELIQUIS ) 5 MG TABS tablet [id number] Yes Take 1 tablet (5 mg total) by mouth 2 (two) times  daily. Vivienne Nest M, PA-C  Active   ARIPiprazole  (ABILIFY ) 5 MG tablet [id number] Yes Take 1 tablet by mouth once daily Edman Marsa PARAS, DO  Active   benzonatate  (TESSALON ) 100 MG capsule [id number] Yes Take 1 capsule (100 mg total) by mouth 3 (three) times daily as needed for cough. Gladis Elsie BROCKS, PA-C  Active   busPIRone  (BUSPAR ) 5 MG tablet [id number] Yes Take 1 tablet (5 mg total) by mouth 3 (three) times daily. Vivienne Nest M, PA-C  Active   cyanocobalamin  (VITAMIN B12) 1000 MCG/ML injection [id number] Yes Inject 1 mL (1,000 mcg total) into the muscle every 30 (thirty) days. Kingston Robes, PA-C  Active Child  doxycycline  (VIBRAMYCIN ) 100 MG capsule [id number] Yes Take 1 capsule (100 mg total) by mouth 2 (two) times daily for 10 days. Brimage, Vondra, DO  Active   fluticasone  (FLONASE ) 50 MCG/ACT nasal spray [id number] Yes Place 2 sprays into both nostrils daily. Edman Marsa PARAS, DO  Active   furosemide  (LASIX ) 40 MG tablet [id number] Yes Take 40 mg by mouth.  Take 1 tablet (40 mg total) by mouth once daily [provider]  Active Child  ipratropium (ATROVENT ) 0.06 % nasal spray [id number] Yes Place 2 sprays into both nostrils 4 (four) times daily. Bernardino Ditch, NP  Active   losartan  (COZAAR ) 50 MG tablet [id number] Yes Take 1 tablet by mouth once daily Edman Marsa PARAS, DO  Active  Child  MAGNESIUM -OXIDE 400 (240 Mg) MG tablet [id number] Yes Take 1 tablet by mouth once daily Karamalegos, Marsa PARAS, DO  Active Child  metoprolol  succinate (TOPROL -XL) 25 MG 24 hr tablet [id number] Yes Take 1 tablet (25 mg total) by mouth daily. Take with or immediately following a meal. Edman, Marsa PARAS, DO  Active Child  Multiple Vitamin (MULTI-VITAMINS) TABS [id number] Yes Take by mouth. [provider]  Active Child  Nebulizers (PARI PRONEB MAX LC SPRINT) MISC [id number] Yes  [provider]  Active Child  omeprazole  (PRILOSEC) 20 MG capsule  [id number] Yes TAKE 1 CAPSULE BY MOUTH ONCE DAILY BEFORE BREAKFAST Karamalegos, Marsa PARAS, DO  Active Child  potassium chloride  SA (KLOR-CON  M) 20 MEQ tablet [id number] Yes Take 1 tablet by mouth once daily Edman Marsa PARAS, DO  Active Child  predniSONE  (DELTASONE ) 50 MG tablet [id number] Yes Take 1 tablet (50 mg total) by mouth daily for 5 days. Brimage, Vondra, DO  Active   rosuvastatin  (CRESTOR ) 40 MG tablet [id number] Yes Take 40 mg by mouth daily. [provider]  Active Child  RYBELSUS  3 MG TABS [id number]  Take 1 tablet (3 mg total) by mouth daily.  Patient not taking: Reported on 11/25/2023   Edman Marsa PARAS, DO  Active Child           Med Note [facility], ELIZABETH A   Fri Oct 18, 2023  7:23 AM) NOT STARTED  saccharomyces boulardii (FLORASTOR) 250 MG capsule [id number] Yes Take 1 capsule (250 mg total) by mouth 2 (two) times daily. Vivienne Delon HERO, [place]  Active   SPIRIVA  HANDIHALER 18 MCG inhalation capsule [id number] Yes Place 1 capsule (18 mcg total) into inhaler and inhale daily. Edman Marsa PARAS, DO  Active Child  SYMBICORT 160-4.5 MCG/ACT inhaler [id number] Yes Inhale 2 puffs into the lungs. [provider]  Active Child  venlafaxine  XR (EFFEXOR -XR) 75 MG 24 hr capsule [id number] Yes TAKE 3 CAPSULES BY MOUTH ONCE DAILY WITH BREAKFAST Karamalegos, Alexander J, DO  Active             Recommendation:   PCP Follow-up Specialty provider follow-up as scheduled TOC RNCM 11/29/23  Follow Up Plan:   Telephone follow up appointment date/time:  11/29/23  11:00am   Chales Pelissier, LCSW [place]  [facility], [facility] Licensed Clinical Social Worker  Direct Dial: [phone]

## 2023-11-26 NOTE — Patient Instructions (Signed)
 Visit Information  Thank you for taking time to visit with me today. Please don't hesitate to contact me if I can be of assistance in the future.  no further scheduled appointments.  Patient denies having any community resource needs at this time and declines  further social worker involvement. Patient remains active with the [facility] RNCM with follow up scheduled for 11/29/23.  Please call the care guide team at [phone] if you need to cancel or reschedule your appointment.    Please call the Suicide and Crisis Lifeline: 988 call the [place]  National Suicide Prevention Lifeline: 412 030 8966 or TTY: 563 249 8285 TTY (330) 527-1433) to talk to a trained counselor call 1-800-273-TALK (toll free, 24 hour hotline) if you are experiencing a Mental Health or Behavioral Health Crisis or need someone to talk to.  Patient verbalizes understanding of instructions and care plan provided today and agrees to view in MyChart. Active MyChart status and patient understanding of how to access instructions and care plan via MyChart confirmed with patient.     Michi Herrmann, LCSW [place]  [facility], [facility] Licensed Clinical Social Worker  Direct Dial: [phone]

## 2023-11-27 ENCOUNTER — Other Ambulatory Visit: Payer: Self-pay | Admitting: Family Medicine

## 2023-11-27 DIAGNOSIS — K219 Gastro-esophageal reflux disease without esophagitis: Secondary | ICD-10-CM

## 2023-11-28 ENCOUNTER — Inpatient Hospital Stay

## 2023-11-28 VITALS — BP 176/74 | HR 94 | Temp 98.4°F | Resp 20

## 2023-11-28 DIAGNOSIS — D509 Iron deficiency anemia, unspecified: Secondary | ICD-10-CM

## 2023-11-28 DIAGNOSIS — J3489 Other specified disorders of nose and nasal sinuses: Secondary | ICD-10-CM | POA: Diagnosis not present

## 2023-11-28 DIAGNOSIS — J348212 External nasal valve collapse, dynamic: Secondary | ICD-10-CM | POA: Diagnosis not present

## 2023-11-28 DIAGNOSIS — G501 Atypical facial pain: Secondary | ICD-10-CM | POA: Diagnosis not present

## 2023-11-28 DIAGNOSIS — Z79620 Long term (current) use of immunosuppressive biologic: Secondary | ICD-10-CM | POA: Diagnosis not present

## 2023-11-28 DIAGNOSIS — Z79899 Other long term (current) drug therapy: Secondary | ICD-10-CM | POA: Diagnosis not present

## 2023-11-28 DIAGNOSIS — J324 Chronic pansinusitis: Secondary | ICD-10-CM | POA: Diagnosis not present

## 2023-11-28 DIAGNOSIS — J342 Deviated nasal septum: Secondary | ICD-10-CM | POA: Diagnosis not present

## 2023-11-28 MED ORDER — IRON SUCROSE 20 MG/ML IV SOLN
200.0000 mg | Freq: Once | INTRAVENOUS | Status: AC
  Administered 2023-11-28: 200 mg via INTRAVENOUS
  Filled 2023-11-28: qty 10

## 2023-11-28 NOTE — Patient Instructions (Signed)

## 2023-11-29 ENCOUNTER — Other Ambulatory Visit: Payer: Self-pay

## 2023-11-29 ENCOUNTER — Telehealth: Payer: Self-pay

## 2023-11-29 NOTE — Transitions of Care (Post Inpatient/ED Visit) (Signed)
 Transition of Care 5  Visit Note  11/29/2023  Name: Elizabeth [name] MRN: [id number]          DOB: [birth year]  Situation: Patient enrolled in [facility] 30-day program. Visit completed with patient by telephone.   Background:   Initial Transition Care Management Follow-up Telephone Call    Past Medical History:  Diagnosis Date   Allergy    Anxiety    B12 deficiency    Carpal tunnel syndrome    Centrilobular emphysema (HCC)    COPD (chronic obstructive pulmonary disease) (HCC)    Depression    Genetic testing    Common Cancers panel (47 genes) @ [facility] - No pathogenic mutations detected   HLD (hyperlipidemia)    Hyperlipidemia    Hypertension    Iron  deficiency anemia 10/16/2016   Lipoma of skin    Lung nodule    Normocytic anemia 10/16/2016   QT prolongation    Substance abuse (HCC)     Assessment: Patient Reported Symptoms: Cognitive Cognitive Status: Alert and oriented to person, place, and time, Normal speech and language skills, Insightful and able to interpret abstract concepts      Neurological Neurological Review of Symptoms: No symptoms reported    HEENT HEENT Symptoms Reported: Other: (Saw ENT on 11/28/23 for deviated septum, CT pending.) HEENT Management Strategies: Medication therapy, Routine screening HEENT Comment: Saw ENT on 11/28/23 for deviated septum, CT pending.    Cardiovascular Cardiovascular Symptoms Reported: No symptoms reported Does patient have uncontrolled Hypertension?: No Cardiovascular Management Strategies: Medical device, Medication therapy, Coping strategies, Activity, Routine screening Cardiovascular Self-Management Outcome: 4 (good)  Respiratory Other Respiratory Symptoms: Baseline shortness of breath per patient, but stated she is doing much better. Did go to UC last week and was given prednisone  and doxycylcine but no real issues found to her relief and anxiety. Respiratory Management Strategies: Activity, Adequate rest, CPAP, Oxygen  therapy, Routine screening, Medication therapy Respiratory Self-Management Outcome: 4 (good)  Endocrine Endocrine Symptoms Reported: No symptoms reported Is patient diabetic?: Yes Is patient checking blood sugars at home?: Yes Endocrine Self-Management Outcome: 4 (good)  Gastrointestinal Gastrointestinal Symptoms Reported: No symptoms reported      Genitourinary Genitourinary Symptoms Reported: No symptoms reported    Integumentary Integumentary Symptoms Reported: No symptoms reported    Musculoskeletal Additional Musculoskeletal Details: Improved, wants to look into pulmonary rehab or some other progressive exercise program/gym. Musculoskeletal Management Strategies: Activity, Adequate rest, Coping strategies, Medical device, Medication therapy, Routine screening Musculoskeletal Self-Management Outcome: 4 (good) Falls in the past year?: No Patient at Risk for Falls Due to: Impaired mobility, Medication side effect Fall risk Follow up: Falls prevention discussed  Psychosocial Psychosocial Symptoms Reported: No symptoms reported Additional Psychological Details: Anxiety is much improved per patient, she also attributes this to [facility] [place] and CCM outreaches that have listened to her and helped her not feel so alone. She had cut herself off from friends and family but has begun to reach out to a friend and an aunt this week. Behavioral Management Strategies: Support system, Activity, Adequate rest, Medication therapy Behavioral Health Self-Management Outcome: 4 (good) Major Change/Loss/Stressor/Fears (CP): Medical condition, self Techniques to Cope with Loss/Stress/Change: Diversional activities, Medication Quality of Family Relationships: helpful, involved, supportive Do you feel physically threatened by others?: No   There were no vitals filed for this visit.  Medications Reviewed Today     Reviewed by Carolee Heron NOVAK, RN (Case Manager) on 11/29/23 at 1137  Med List Status: <None>    Medication Order Taking?  Sig Documenting Provider Last Dose Status Informant  albuterol  (VENTOLIN  HFA) 108 (90 Base) MCG/ACT inhaler [id number]  Inhale 2 puffs into the lungs every 6 (six) hours as needed for wheezing or shortness of breath. Edman Marsa PARAS, DO  Active Child  apixaban  (ELIQUIS ) 5 MG TABS tablet [id number]  Take 1 tablet (5 mg total) by mouth 2 (two) times daily. Vivienne Nest M, PA-C  Active   ARIPiprazole  (ABILIFY ) 5 MG tablet [id number] Yes Take 1 tablet by mouth once daily Edman Marsa PARAS, DO  Active   benzonatate  (TESSALON ) 100 MG capsule [id number]  Take 1 capsule (100 mg total) by mouth 3 (three) times daily as needed for cough. Gladis Elsie BROCKS, PA-C  Active   busPIRone  (BUSPAR ) 5 MG tablet [id number]  Take 1 tablet (5 mg total) by mouth 3 (three) times daily.  Patient not taking: Reported on 11/29/2023   Vivienne Nest M, PA-C  Active   cyanocobalamin  (VITAMIN B12) 1000 MCG/ML injection [id number] Yes Inject 1 mL (1,000 mcg total) into the muscle every 30 (thirty) days. Kingston Robes, PA-C  Active Child  doxycycline  (VIBRAMYCIN ) 100 MG capsule [id number]  Take 1 capsule (100 mg total) by mouth 2 (two) times daily for 10 days. Brimage, Vondra, DO  Active   fluticasone  (FLONASE ) 50 MCG/ACT nasal spray [id number]  Place 2 sprays into both nostrils daily. Edman Marsa PARAS, DO  Active   furosemide  (LASIX ) 40 MG tablet [id number] Yes Take 40 mg by mouth.  Take 1 tablet (40 mg total) by mouth once daily [provider]  Active Child  ipratropium (ATROVENT ) 0.06 % nasal spray [id number] Yes Place 2 sprays into both nostrils 4 (four) times daily. Bernardino Ditch, NP  Active   losartan  (COZAAR ) 50 MG tablet [id number]  Take 1 tablet by mouth once daily Edman Marsa PARAS, DO  Active Child  MAGNESIUM -OXIDE 400 (240 Mg) MG tablet [id number] Yes Take 1 tablet by mouth once daily Edman Marsa PARAS, DO  Active Child  metoprolol  succinate  (TOPROL -XL) 25 MG 24 hr tablet [id number] Yes Take 1 tablet (25 mg total) by mouth daily. Take with or immediately following a meal. Edman, Marsa PARAS, DO  Active Child  Multiple Vitamin (MULTI-VITAMINS) TABS [id number] Yes Take by mouth. [provider]  Active Child  Nebulizers (PARI PRONEB MAX LC SPRINT) MISC [id number] Yes  [provider]  Active Child  omeprazole  (PRILOSEC) 20 MG capsule [id number] Yes TAKE 1 CAPSULE BY MOUTH ONCE DAILY BEFORE BREAKFAST Karamalegos, Marsa PARAS, DO  Active Child  potassium chloride  SA (KLOR-CON  M) 20 MEQ tablet [id number] Yes Take 1 tablet by mouth once daily Edman Marsa PARAS, DO  Active Child  rosuvastatin  (CRESTOR ) 40 MG tablet [id number] Yes Take 40 mg by mouth daily. [provider]  Active Child  RYBELSUS  3 MG TABS [id number]  Take 1 tablet (3 mg total) by mouth daily.  Patient not taking: Reported on 11/29/2023   Edman Marsa PARAS, DO  Active Child           Med Note [facility], ELIZABETH A   Fri Oct 18, 2023  7:23 AM) NOT STARTED  saccharomyces boulardii (FLORASTOR) 250 MG capsule [id number] Yes Take 1 capsule (250 mg total) by mouth 2 (two) times daily. Vivienne Nest HERO, PA-C  Active   SPIRIVA  HANDIHALER 18 MCG inhalation capsule [id number] Yes Place 1 capsule (18 mcg total) into inhaler and inhale daily. Edman Marsa PARAS, DO  Active Child  SYMBICORT 160-4.5 MCG/ACT inhaler [id number] Yes Inhale 2 puffs  into the lungs. [provider]  Active Child  venlafaxine  XR (EFFEXOR -XR) 75 MG 24 hr capsule [id number] Yes TAKE 3 CAPSULES BY MOUTH ONCE DAILY WITH BREAKFAST Karamalegos, Marsa PARAS, DO  Active             Goals Addressed             This Visit's Progress    [facility] Transitions of Care (TOC) Care Plan       (All reviewed and or updated on 11/29/23 on telephonic call with patient).   Problems:  Recent Hospitalization for treatment of Pulmonary Disease Knowledge Deficit Related to  Respiratory Failure/pneumonia Anxiety issues.   Reviewed on 11/29/23 on phone call with patient.   Goal:  Over the next 30 days, the patient will not experience hospital readmission.(Completed).  11/22/23 marked 30 days without readmission so far.   Interventions:  Transitions of Care:  Discussed psychosocial and anxiety and patient states she is doing much better. At baseline shortness of breath. Discussed asking PCP about pulmonary rehab or other programs to help with supervised exercise programs.  Continue good self care management with assistance of daughter and is feeling much more positive.  Contacting friends and family after anxiety made her cut herself off, patient stated.  Did go to Urgent Care last week to be checked out.  Started on prednisone  for 5 days and antibiotic for 10 days.   Patient Self Care Activities:  Attend all scheduled provider appointments Call pharmacy for medication refills 3-7 days in advance of running out of medications Call provider office for new concerns or questions  Notify RN Care Manager of TOC call rescheduling needs Participate in Transition of Care Program/Attend TOC scheduled calls Perform all self care activities independently  Take medications as prescribed   Continue to monitor SpO2, weights, blood pressure, blood sugars at home.  Keep log to take to provider as you have been doing.   Plan:  Continue with current plan.  11/22/23 is day for no readmissions. Patient requested one more follow up call next week.  Appreciative of all TOC and CCM calls as has felt listened to and helped her not feel so alone.          Recommendation:   Continue Current Plan of Care  Follow Up Plan:   Telephone follow-up in 1 week and will consider referral to CCM longitudinal care management on next call.    Bing Edison MSN, RN RN Case Sales executive Health  [facility] Office Hours M-F [phone] Direct Dial: [phone] Main Phone  [phone]  Fax: [phone] [email or web address]

## 2023-11-29 NOTE — Telephone Encounter (Signed)
 Requested Prescriptions  Pending Prescriptions Disp Refills   omeprazole  (PRILOSEC) 20 MG capsule [Pharmacy Med Name: Omeprazole  20 MG Oral Capsule Delayed Release] 90 capsule 2    Sig: TAKE 1 CAPSULE BY MOUTH ONCE DAILY BEFORE BREAKFAST     Gastroenterology: Proton Pump Inhibitors Passed - 11/29/2023 12:40 PM      Passed - Valid encounter within last 12 months    Recent Outpatient Visits           1 month ago Weakness generalized   [place] [facility] [place], Marsa PARAS, DO   1 month ago Atrial fibrillation with [facility])   [facility] [place] Marsa PARAS, DO   1 month ago Weakness   [place] [facility] Everlene Parris LABOR, MD   2 months ago Annual physical exam   [place] [facility] Ed[place] Marsa PARAS, DO   5 months ago Type 2 diabetes mellitus with other specified complication, without long-term current use of insulin  [facility])   [place] [place], Marsa PARAS, [place]

## 2023-11-29 NOTE — Patient Instructions (Signed)
 Visit Information  Thank you for taking time to visit with me today. Please don't hesitate to contact me if I can be of assistance to you before our next scheduled telephone appointment.  Our next appointment is by telephone on 12/06/23 at 1100  Following is a copy of your care plan:   Goals Addressed             This Visit's Progress    VBCI Transitions of Care (TOC) Care Plan       (All reviewed and or updated on 11/29/23 on telephonic call with patient).   Problems:  Recent Hospitalization for treatment of Pulmonary Disease Knowledge Deficit Related to Respiratory Failure/pneumonia Anxiety issues.   Reviewed on 11/29/23 on phone call with patient.   Goal:  Over the next 30 days, the patient will not experience hospital readmission.(Completed).  11/22/23 marked 30 days without readmission so far.   Interventions:  Transitions of Care:  Discussed psychosocial and anxiety and patient states she is doing much better. At baseline shortness of breath. Discussed asking PCP about pulmonary rehab or other programs to help with supervised exercise programs.  Continue good self care management with assistance of daughter and is feeling much more positive.  Contacting friends and family after anxiety made her cut herself off, patient stated.  Did go to Urgent Care last week to be checked out.  Started on prednisone  for 5 days and antibiotic for 10 days.   Patient Self Care Activities:  Attend all scheduled provider appointments Call pharmacy for medication refills 3-7 days in advance of running out of medications Call provider office for new concerns or questions  Notify RN Care Manager of TOC call rescheduling needs Participate in Transition of Care Program/Attend TOC scheduled calls Perform all self care activities independently  Take medications as prescribed   Continue to monitor SpO2, weights, blood pressure, blood sugars at home.  Keep log to take to provider as you have been  doing.   Plan:  Continue with current plan.  11/22/23 is day for no readmissions. Patient requested one more follow up call next week.  Appreciative of all TOC and CCM calls as has felt listened to and helped her not feel so alone.          Patient verbalizes understanding of instructions and care plan provided today and agrees to view in MyChart. Active MyChart status and patient understanding of how to access instructions and care plan via MyChart confirmed with patient.     Telephone follow up appointment with care management team member scheduled for: Our next appointment is by telephone on 12/06/23 at 1100  Please call the care guide team at [phone] if you need to cancel or reschedule your appointment.   Please call the Suicide and Crisis Lifeline: 988 call 1-800-273-TALK (toll free, 24 hour hotline) call 911 if you are experiencing a Mental Health or Behavioral Health Crisis or need someone to talk to.   Bing Edison MSN, RN RN Case Sales executive Health  VBCI-Population Health Office Hours M-F [phone] Direct Dial: [phone] Main Phone [phone]  Fax: [phone] [email or web address]

## 2023-12-04 ENCOUNTER — Other Ambulatory Visit: Payer: Self-pay | Admitting: Otolaryngology

## 2023-12-04 DIAGNOSIS — J342 Deviated nasal septum: Secondary | ICD-10-CM

## 2023-12-04 DIAGNOSIS — J3489 Other specified disorders of nose and nasal sinuses: Secondary | ICD-10-CM

## 2023-12-04 DIAGNOSIS — J34829 Nasal valve collapse, unspecified: Secondary | ICD-10-CM

## 2023-12-05 ENCOUNTER — Encounter: Payer: Self-pay | Admitting: Otolaryngology

## 2023-12-06 ENCOUNTER — Other Ambulatory Visit: Payer: Self-pay

## 2023-12-06 ENCOUNTER — Encounter: Payer: Self-pay | Admitting: Family Medicine

## 2023-12-06 NOTE — Transitions of Care (Post Inpatient/ED Visit) (Signed)
 Transition of Care Week 7  Visit Note  12/06/2023  Name: Dene Landsberg MRN: [id number]          DOB: July 16, 1950  Situation: Patient enrolled in [facility] 30-day program. Visit completed with patient by telephone.   Background: Completed all goals of TOC 30 day post discharge program. Will be followed up by CCM RN CM on 12/16/23 and patient is aware and open to this change.   Problems:  Recent Hospitalization for treatment of Pulmonary Disease Knowledge Deficit Related to Respiratory Failure/pneumonia Acute anxiety issues surrounding recent events leading to hospitalization.  Patient states she has improved a great deal since discharge and attributes this to self talk, these phone calls, family support, feeling stronger.   Initial Transition Care Management Follow-up Telephone Call    Past Medical History:  Diagnosis Date   Allergy    Anxiety    B12 deficiency    Carpal tunnel syndrome    Centrilobular emphysema (HCC)    COPD (chronic obstructive pulmonary disease) (HCC)    Depression    Genetic testing    Common Cancers panel (47 genes) @ [facility] - No pathogenic mutations detected   HLD (hyperlipidemia)    Hyperlipidemia    Hypertension    Iron  deficiency anemia 10/16/2016   Lipoma of skin    Lung nodule    Normocytic anemia 10/16/2016   QT prolongation    Substance abuse (HCC)     Assessment: Patient Reported Symptoms: Cognitive Cognitive Status: Alert and oriented to person, place, and time, Normal speech and language skills, Insightful and able to interpret abstract concepts      Neurological Neurological Review of Symptoms: No symptoms reported    HEENT HEENT Symptoms Reported:  (Reports history of sinus infections and has a CT scheduled and conult with ENT provider to assess any issues or obstructions. Patient uses CPAP with oxygen at 2L at night. Also reports an occasional nose bleed that is short lived and has reported.) HEENT Management Strategies: Routine  screening, Medication therapy, Medical device HEENT Self-Management Outcome: 4 (good) HEENT Comment: CT via ENT pending, possible nasal septum issues, history of sinus infections.    Cardiovascular Cardiovascular Symptoms Reported: No symptoms reported    Respiratory Respiratory Symptoms Reported: Shortness of breath Other Respiratory Symptoms: Baseline Shortness of breath plus a little extra just getting out of shower when called, also loss of a good friend affects her breathing, but nothing alarming per patient. Respiratory Management Strategies: Activity, Adequate rest, CPAP, Coping strategies, Medication therapy, Oxygen therapy, Routine screening, Pulmonary rehab (Looking into pulmonary rehab.) Respiratory Self-Management Outcome: 4 (good)  Endocrine Endocrine Symptoms Reported: No symptoms reported Is patient diabetic?: Yes Is patient checking blood sugars at home?: Yes List most recent blood sugar readings, include date and time of day: Daughter takes it for patient and told her it was normal this am but she did not know specific value. Endocrine Self-Management Outcome: 4 (good)  Gastrointestinal Gastrointestinal Symptoms Reported: No symptoms reported      Genitourinary Genitourinary Symptoms Reported: No symptoms reported    Integumentary Integumentary Symptoms Reported: No symptoms reported Skin Self-Management Outcome: 4 (good)  Musculoskeletal Musculoskelatal Symptoms Reviewed: Weakness Additional Musculoskeletal Details: Improving, trying to afford pulmonary rehab or some other supervised exercise program. Musculoskeletal Management Strategies: Activity, Adequate rest, Exercise, Medication therapy, Routine screening Musculoskeletal Self-Management Outcome: 4 (good) Musculoskeletal Comment: Motivated to improve endurance to be able to get out more and for general well being. Cannot afford co-pay as reported but is going  to call insurance again Falls in the past year?:  No Patient at Risk for Falls Due to: Medication side effect, Other (Comment) (low exercise endurance.)  Psychosocial Additional Psychological Details: Anxiety about hospitalization and patient's feeling of trauma associated with this event has much improved per patient. She is getting out some with daughter and grandson, had reached out to some friends and family that she had shut herself off from due to her overwhelming fear and anxiety. She is able to express her feelings and work through the perceived versus reality of the situation, also checking in with daughter on any symptoms she starts to persevrate about, and feels she has made good progress in this area. RN CM has also subjectively monitored this progress and improvements in patient's ability to use coping strategies, family support system, self management activities. Patient also stated these calls and other calls from [facility] CCM have helped her not feel so isolated and alone in her feelings about her traumatic experience with losing the ability to breath in the middle of the night. This was out last call for the [facility] program and she was informed that a CCM RN CM will be reaching out on 12/16/23 and explained the difference in the two programs. She was receptive to this though a bit anxious about the change over. TOC RN CM phone number was reinforced along with discussions about being her own health advocate and listening to her body. She was very appreciative of the calls/follow up program from [facility]. Behavioral Management Strategies: Activity, Abstinence from substances, Adequate rest, Coping strategies, Support system Behavioral Health Self-Management Outcome: 4 (good) Behavioral Health Comment: See above note: Patient states she is doing much better with her anxiety issues surrounding recent hospitalization events. Major Change/Loss/Stressor/Fears (CP): Medical condition, self Behaviors When Feeling Stressed/Fearful: Support from daughter, acknowledge  anxiety/fears and work through them to separate real issues from anticipatory fears/anxiety. Has declined LCSW outreach need when contacted. Techniques to Cope with Loss/Stress/Change: Diversional activities, Medication Quality of Family Relationships: helpful, supportive, involved Do you feel physically threatened by others?: No   Vitals:   12/06/23 1156  BP: (!) 157/74  Pulse: 92  SpO2: 98%    Medications Reviewed Today     Reviewed by Carolee Heron NOVAK, RN (Case Manager) on 12/06/23 at 1147  Med List Status: <None>   Medication Order Taking? Sig Documenting Provider Last Dose Status Informant  albuterol  (VENTOLIN  HFA) 108 (90 Base) MCG/ACT inhaler [id number] Yes Inhale 2 puffs into the lungs every 6 (six) hours as needed for wheezing or shortness of breath. Edman Marsa PARAS, DO  Active Child  apixaban  (ELIQUIS ) 5 MG TABS tablet [id number] Yes Take 1 tablet (5 mg total) by mouth 2 (two) times daily. Vivienne Nest M, PA-C  Active   ARIPiprazole  (ABILIFY ) 5 MG tablet [id number] Yes Take 1 tablet by mouth once daily Edman Marsa PARAS, DO  Active   benzonatate  (TESSALON ) 100 MG capsule [id number]  Take 1 capsule (100 mg total) by mouth 3 (three) times daily as needed for cough. Gladis Elsie BROCKS, PA-C  Active   busPIRone  (BUSPAR ) 5 MG tablet [id number]  Take 1 tablet (5 mg total) by mouth 3 (three) times daily.  Patient not taking: Reported on 12/06/2023   Vivienne Nest HERO, PA-C  Active   cyanocobalamin  (VITAMIN B12) 1000 MCG/ML injection [id number] Yes Inject 1 mL (1,000 mcg total) into the muscle every 30 (thirty) days. Kingston Robes, PA-C  Active Child  fluticasone  (FLONASE ) 50 MCG/ACT nasal spray [id number]  Place 2 sprays into both nostrils daily. Edman Marsa PARAS, DO  Active   furosemide  (LASIX ) 40 MG tablet [id number] Yes Take 40 mg by mouth.  Take 1 tablet (40 mg total) by mouth once daily [provider]  Active Child  ipratropium (ATROVENT ) 0.06 % nasal  spray [id number] Yes Place 2 sprays into both nostrils 4 (four) times daily. Bernardino Ditch, NP  Active   losartan  (COZAAR ) 50 MG tablet [id number] Yes Take 1 tablet by mouth once daily Edman Marsa PARAS, DO  Active Child  MAGNESIUM -OXIDE 400 (240 Mg) MG tablet [id number] Yes Take 1 tablet by mouth once daily Edman Marsa PARAS, DO  Active Child  metoprolol  succinate (TOPROL -XL) 25 MG 24 hr tablet [id number] Yes Take 1 tablet (25 mg total) by mouth daily. Take with or immediately following a meal. Edman, Marsa PARAS, DO  Active Child  Multiple Vitamin (MULTI-VITAMINS) TABS [id number] Yes Take by mouth. [provider]  Active Child  Nebulizers (PARI PRONEB MAX [facility] SPRINT) MISC [id number] Yes  [provider]  Active Child  omeprazole  (PRILOSEC) 20 MG capsule [id number] Yes TAKE 1 CAPSULE BY MOUTH ONCE DAILY BEFORE BREAKFAST Karamalegos, Marsa PARAS, DO  Active   potassium chloride  SA (KLOR-CON  M) 20 MEQ tablet [id number] Yes Take 1 tablet by mouth once daily Edman Marsa PARAS, DO  Active Child  rosuvastatin  (CRESTOR ) 40 MG tablet [id number] Yes Take 40 mg by mouth daily. [provider]  Active Child  RYBELSUS  3 MG TABS [id number]  Take 1 tablet (3 mg total) by mouth daily.  Patient not taking: Reported on 12/06/2023   Edman Marsa PARAS, DO  Active Child           Med Note [facility], ELIZABETH A   Fri Oct 18, 2023  7:23 AM) NOT STARTED  saccharomyces boulardii (FLORASTOR) 250 MG capsule [id number] Yes Take 1 capsule (250 mg total) by mouth 2 (two) times daily. Vivienne Delon CHRISTELLA DEVONNA  Active   SPIRIVA  HANDIHALER 18 MCG inhalation capsule [id number] Yes Place 1 capsule (18 mcg total) into inhaler and inhale daily. Edman Marsa PARAS, DO  Active Child  SYMBICORT 160-4.5 MCG/ACT inhaler [id number] Yes Inhale 2 puffs into the lungs. [provider]  Active Child  venlafaxine  XR (EFFEXOR -XR) 75 MG 24 hr capsule [id number] Yes TAKE 3 CAPSULES BY  MOUTH ONCE DAILY WITH BREAKFAST Karamalegos, Marsa PARAS, DO  Active             Goals Addressed             This Visit's Progress    VBCI Transitions of Care (TOC) Care Plan       (All reviewed and or updated on 12/06/23 TOC week 7 on telephonic call with patient).   Problems:  Recent Hospitalization for treatment of Pulmonary Disease Knowledge Deficit Related to Respiratory Failure/pneumonia Anxiety issues surrounding recent events leading to hospitalization.  Patient states she has improved a great deal since discharge and attributes this to self talk, these phone calls, family support, feeling stronger.   Reviewed on 11/29/23 on phone call with patient.   Goal:  Over the next 30 days, the patient will not experience hospital readmission.(Completed).  11/22/23 marked 30 days without readmission so far.  Patient has met all goals of program and has a plan to maintain self care going forward.   Interventions:  Transitions of Care:  Discussed psychosocial and anxiety and patient states she continues to feel she is doing much better. At baseline shortness of  breath. Patient stated maybe a little more the past few days over some issues with death of a friend and [place] traveling outside [place] .  Discussed asking PCP about pulmonary rehab or other programs to help with supervised exercise programs.  Motivated to start a program but unable to afford co-pay per first inquiry to insurance company.  Patient is going to contact insurance payor again as she has been medically referred to cardiopulmonary rehab post discharge  Patient will explore other supervised exercise programs or possibly OP physical therapy if not rehab.  Continue good self care management with assistance of daughter and is feeling much more positive.  Contacting friends and family after anxiety made her cut herself off, patient stated.  Loss of a friend has impacted her this week, but has logical thoughts about this and how  it affects her.  Did go to Urgent Care last week to be checked out.  Started on prednisone  for 5 days and antibiotic for 10 days.  Completed all UC prescribed medications with no other medications changes since last week.   Patient Self Care Activities:  Attend all scheduled provider appointments Call pharmacy for medication refills 3-7 days in advance of running out of medications Call provider office for new concerns or questions  Notify RN Care Manager of TOC call rescheduling needs Participate in Transition of Care Program/Attend TOC scheduled calls Perform all self care activities independently  Take medications as prescribed   Continue to seek out social activities with daughter and reconnections with friends and family as feels able, but knowing limitations and boundaries for self.  Continue to wear CPAP with oxygen at night as prescribed.  Continue to monitor SpO2, weights, blood pressure, blood sugars at home.  Keep log to take to provider as you have been doing.  Continue to seek out exercise rehab program through referral/contacting benefit payor.   Plan:  Continue with current plan of patient self care management activities discussed and performed during this post discharge follow up period.  11/22/23 is day for no readmissions, patient has met all goals.  Patient is very verbally appreciative of all TOC and CCM calls as has felt listened to and  this has helped her not feel so alone.  Understands that CCM RN CM will reach out on 12/16/23 and explained the differences in the two programs.  TOC RN CM phone number confirmed with patient if need arises or wants to check in.  Patient verbally expressed understanding of plan and that this concludes weekly follow up calls from [facility] RN CM, but has phone number if needed.   Bing Edison MSN, RN RN Case Sales executive Health  VBCI-Population Health Office Hours M-F [phone] Direct Dial: [phone] Main Phone [phone]  Fax:  [phone] [email or web address]            Plan:  Continue with current plan of patient self care management activities discussed and performed during this post discharge follow up period.  11/22/23 is day for no readmissions, patient has met all goals.  Patient is very verbally appreciative of all TOC and CCM calls as has felt listened to and  this has helped her not feel so alone.  Understands that CCM RN CM will reach out on 12/16/23 and explained the differences in the two programs.  TOC RN CM phone number confirmed with patient if need arises or wants to check in.  Patient verbally expressed understanding of plan and that this concludes weekly follow up calls from [facility] RN CM,  but has phone number if needed.   Recommendation:   Continue Current Plan of Care This concludes TOC 30 day post discharge follow up calls at [facility] week 7 and patient is aware of this.   Follow Up Plan:   Patient has met all care management goals.  Care Management case will be closed.  Patient has been provided contact information should new needs arise.  CCM RN CM has scheduled outreach on 12/16/23 and patient is also aware of this.  TOC RN CM will hand off to CCM RN CM via Inbasket and a secure chat of closure of 30 day program.    Bing Edison MSN, RN RN Case Manager [place]  VBCI-Population Health Office Hours M-F [phone] Direct Dial: [phone] Main Phone [phone]  Fax: [phone] [email or web address]

## 2023-12-06 NOTE — Patient Instructions (Signed)
 Visit Information  Thank you for taking time to visit with me today and over the past weeks. Please don't hesitate to contact me if I can be of assistance to you.  Following is a copy of your care plan:   Goals Addressed             This Visit's Progress    VBCI Transitions of Care (TOC) Care Plan       (All reviewed and or updated on 12/06/23 TOC week 7 on telephonic call with patient).   Problems:  Recent Hospitalization for treatment of Pulmonary Disease Knowledge Deficit Related to Respiratory Failure/pneumonia Anxiety issues surrounding recent events leading to hospitalization.  Patient states she has improved a great deal since discharge and attributes this to self talk, these phone calls, family support, feeling stronger.   Reviewed on 11/29/23 on phone call with patient.   Goal:  Over the next 30 days, the patient will not experience hospital readmission.(Completed).  11/22/23 marked 30 days without readmission so far.  Patient has met all goals of program and has a plan to maintain self care going forward.   Interventions:  Transitions of Care:  Discussed psychosocial and anxiety and patient states she continues to feel she is doing much better. At baseline shortness of breath. Patient stated maybe a little more the past few days over some issues with death of a friend and [place] traveling outside [place] .  Discussed asking PCP about pulmonary rehab or other programs to help with supervised exercise programs.  Motivated to start a program but unable to afford co-pay per first inquiry to insurance company.  Patient is going to contact insurance payor again as she has been medically referred to cardiopulmonary rehab post discharge  Patient will explore other supervised exercise programs or possibly OP physical therapy if not rehab.  Continue good self care management with assistance of daughter and is feeling much more positive.  Contacting friends and family after anxiety made her  cut herself off, patient stated.  Loss of a friend has impacted her this week, but has logical thoughts about this and how it affects her.  Did go to Urgent Care last week to be checked out.  Started on prednisone  for 5 days and antibiotic for 10 days.  Completed all UC prescribed medications with no other medications changes since last week.   Patient Self Care Activities:  Attend all scheduled provider appointments Call pharmacy for medication refills 3-7 days in advance of running out of medications Call provider office for new concerns or questions  Notify RN Care Manager of TOC call rescheduling needs Participate in Transition of Care Program/Attend TOC scheduled calls Perform all self care activities independently  Take medications as prescribed   Continue to seek out social activities with daughter and reconnections with friends and family as feels able, but knowing limitations and boundaries for self.  Continue to wear CPAP with oxygen at night as prescribed.  Continue to monitor SpO2, weights, blood pressure, blood sugars at home.  Keep log to take to provider as you have been doing.  Continue to seek out exercise rehab program through referral/contacting benefit payor.   Plan:  Continue with current plan of patient self care management activities discussed and performed during this post discharge follow up period.  11/22/23 is day for no readmissions, patient has met all goals.  Patient is very verbally appreciative of all TOC and CCM calls as has felt listened to and  this has helped her  not feel so alone.  Understands that CCM RN CM will reach out on 12/16/23 and explained the differences in the two programs.  TOC RN CM phone number confirmed with patient if need arises or wants to check in.  Patient verbally expressed understanding of plan and that this concludes weekly follow up calls from [facility] RN CM, but has phone number if needed.   Bing Edison MSN, RN RN Case Sales executive  Health  VBCI-Population Health Office Hours M-F [phone] Direct Dial: [phone] Main Phone [phone]  Fax: [phone] [email or web address]             Patient verbalizes understanding of instructions and care plan provided today and agrees to view in MyChart. Active MyChart status and patient understanding of how to access instructions and care plan via MyChart confirmed with patient.      No further follow up required: Patient has met all goals of TOC post discharge program!  Best Wishes to You!  A CCM RN CM will outreach patient on 12/16/22 Nan Slusher, RN CM) for longitudinal follow up.   Please call the care guide team at [phone] if you need to cancel or reschedule your appointment.   Please call the Suicide and Crisis Lifeline: 988 call 1-800-273-TALK (toll free, 24 hour hotline) call 911 if you are experiencing a Mental Health or Behavioral Health Crisis or need someone to talk to.   Bing Edison MSN, RN RN Case Sales executive Health  VBCI-Population Health Office Hours M-F [phone] Direct Dial: [phone] Main Phone [phone]  Fax: [phone] [email or web address]

## 2023-12-09 ENCOUNTER — Ambulatory Visit
Admission: RE | Admit: 2023-12-09 | Discharge: 2023-12-09 | Disposition: A | Discharge status: Home / Self Care | Source: Ambulatory Visit | Attending: Otolaryngology | Admitting: Otolaryngology

## 2023-12-09 DIAGNOSIS — J342 Deviated nasal septum: Secondary | ICD-10-CM

## 2023-12-09 DIAGNOSIS — J34829 Nasal valve collapse, unspecified: Secondary | ICD-10-CM

## 2023-12-09 DIAGNOSIS — J3489 Other specified disorders of nose and nasal sinuses: Secondary | ICD-10-CM

## 2023-12-13 ENCOUNTER — Telehealth: Payer: Self-pay

## 2023-12-14 ENCOUNTER — Encounter: Payer: Self-pay | Admitting: Oncology

## 2023-12-14 ENCOUNTER — Encounter: Payer: Self-pay | Admitting: Emergency Medicine

## 2023-12-14 ENCOUNTER — Ambulatory Visit
Admission: EM | Admit: 2023-12-14 | Discharge: 2023-12-14 | Disposition: A | Discharge status: Home / Self Care | Attending: Emergency Medicine | Admitting: Emergency Medicine

## 2023-12-14 DIAGNOSIS — R519 Headache, unspecified: Secondary | ICD-10-CM | POA: Insufficient documentation

## 2023-12-14 DIAGNOSIS — E860 Dehydration: Secondary | ICD-10-CM | POA: Insufficient documentation

## 2023-12-14 LAB — CBC WITH DIFFERENTIAL/PLATELET
Abs Immature Granulocytes: 0.03 K/uL (ref 0.00–0.07)
Basophils Absolute: 0 K/uL (ref 0.0–0.1)
Basophils Relative: 0 %
Eosinophils Absolute: 0 K/uL (ref 0.0–0.5)
Eosinophils Relative: 0 %
HCT: 36.6 % (ref 36.0–46.0)
Hemoglobin: 11.8 g/dL — ABNORMAL LOW (ref 12.0–15.0)
Immature Granulocytes: 0 %
Lymphocytes Relative: 28 %
Lymphs Abs: 2.1 K/uL (ref 0.7–4.0)
MCH: 27.1 pg (ref 26.0–34.0)
MCHC: 32.2 g/dL (ref 30.0–36.0)
MCV: 83.9 fL (ref 80.0–100.0)
Monocytes Absolute: 0.6 K/uL (ref 0.1–1.0)
Monocytes Relative: 8 %
Neutro Abs: 4.9 K/uL (ref 1.7–7.7)
Neutrophils Relative %: 64 %
Platelets: 243 K/uL (ref 150–400)
RBC: 4.36 MIL/uL (ref 3.87–5.11)
RDW: 16 % — ABNORMAL HIGH (ref 11.5–15.5)
WBC: 7.7 K/uL (ref 4.0–10.5)
nRBC: 0 % (ref 0.0–0.2)

## 2023-12-14 LAB — COMPREHENSIVE METABOLIC PANEL WITH GFR
ALT: 17 U/L (ref 0–44)
AST: 25 U/L (ref 15–41)
Albumin: 3.9 g/dL (ref 3.5–5.0)
Alkaline Phosphatase: 96 U/L (ref 38–126)
Anion gap: 8 (ref 5–15)
BUN: 11 mg/dL (ref 8–23)
CO2: 30 mmol/L (ref 22–32)
Calcium: 8.8 mg/dL — ABNORMAL LOW (ref 8.9–10.3)
Chloride: 100 mmol/L (ref 98–111)
Creatinine, Ser: 0.81 mg/dL (ref 0.44–1.00)
GFR, Estimated: >60 mL/min (ref >60)
Glucose, Bld: 141 mg/dL — ABNORMAL HIGH (ref 70–99)
Potassium: 3.9 mmol/L (ref 3.5–5.1)
Sodium: 138 mmol/L (ref 135–145)
Total Bilirubin: 0.5 mg/dL (ref 0.0–1.2)
Total Protein: 7.3 g/dL (ref 6.5–8.1)

## 2023-12-14 NOTE — ED Triage Notes (Signed)
 Patient states that she had a headache and fatigue for a month.  Patient states that she recently had a CT head.  Patient reports ongoing headache for 2 weeks.  Patient states that she was in the hospital in July for Sepsis.

## 2023-12-14 NOTE — ED Provider Notes (Signed)
 [facility]    CSN: [id number] Arrival date & time: 12/14/23  9073      History   Chief Complaint Chief Complaint  Patient presents with   Headache   Fatigue    HPI Marykate Heuberger is a 73 y.o. female.   HPI  73 year old female with past medical history significant for centrilobular emphysema, normocytic anemia, IDA, anxiety, B12 deficiency, congenital factor XI deficiency, dyspepsia, benign essential hypertension, moderate recurrent MDD, mixed hyperlipidemia, QT prolongation, and a lung nodule presents for evaluation of ongoing frontal headache.  She reports that it is primarily in the center or left of her forehead and has been present for the past month.  Some nasal congestion but no nasal discharge, change in vision, or fever.  Past Medical History:  Diagnosis Date   Allergy    Anxiety    B12 deficiency    Carpal tunnel syndrome    Centrilobular emphysema (HCC)    COPD (chronic obstructive pulmonary disease) (HCC)    Depression    Genetic testing    Common Cancers panel (47 genes) @ [facility] - No pathogenic mutations detected   HLD (hyperlipidemia)    Hyperlipidemia    Hypertension    Iron  deficiency anemia 10/16/2016   Lipoma of skin    Lung nodule    Normocytic anemia 10/16/2016   QT prolongation    Substance abuse (HCC)     Patient Active Problem List   Diagnosis Date Noted   Acute hypoxic respiratory failure (HCC) 10/18/2023   Severe sepsis (HCC) 10/18/2023   COPD with acute exacerbation (HCC) 10/18/2023   CAP (community acquired pneumonia) 10/18/2023   Weakness generalized 10/09/2023   Type 2 diabetes mellitus with other specified complication (HCC) 10/06/2021   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) 07/04/2021   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) 07/04/2021   OSA on CPAP 06/26/2019   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 03/11/2018   Senile nuclear sclerosis, left 03/11/2018   Substance  abuse (HCC)    Genetic testing    Pernicious anemia 11/27/2016   Normocytic anemia 10/16/2016   Iron  deficiency anemia 10/16/2016   Centrilobular emphysema (HCC)    Major depressive disorder, recurrent, moderate (HCC) 09/24/2016   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   Bilateral carpal tunnel syndrome 05/27/2015   Bilateral hand numbness 05/17/2015   Fracture of humerus, proximal, closed 03/23/2013   Lipoma of skin and subcutaneous tissue 12/26/2011   B12 deficiency 02/15/2011   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      Home Medications    Prior to Admission medications   Medication Sig Start Date End Date Taking? Authorizing Provider  albuterol  (VENTOLIN  HFA) 108 (90 Base) MCG/ACT inhaler Inhale 2 puffs into the lungs every 6 (six) hours as needed for wheezing or shortness of breath. 03/21/23   Karamalegos, Marsa PARAS, DO  apixaban  (ELIQUIS ) 5 MG TABS tablet Take 1 tablet (5 mg total) by  mouth 2 (two) times daily. 10/23/23   Burnette, Jennifer M, PA-C  ARIPiprazole  (ABILIFY ) 5 MG tablet Take 1 tablet by mouth once daily 11/15/23   Karamalegos, Marsa PARAS, DO  benzonatate  (TESSALON ) 100 MG capsule Take 1 capsule (100 mg total) by mouth 3 (three) times daily as needed for cough. 11/09/23   Gladis Elsie BROCKS, PA-C  busPIRone  (BUSPAR ) 5 MG tablet Take 1 tablet (5 mg total) by mouth 3 (three) times daily. Patient not taking: Reported on 12/06/2023 10/23/23   Burnette, Jennifer M, PA-C  cyanocobalamin  (VITAMIN B12) 1000 MCG/ML  injection Inject 1 mL (1,000 mcg total) into the muscle every 30 (thirty) days. 10/04/23   Reyes, Shaylo, PA-C  fluticasone  (FLONASE ) 50 MCG/ACT nasal spray Place 2 sprays into both nostrils daily. 10/29/23   Karamalegos, Marsa PARAS, DO  furosemide  (LASIX ) 40 MG tablet Take 40 mg by mouth.  Take 1 tablet (40 mg total) by mouth once daily 08/28/23   [provider]  ipratropium (ATROVENT ) 0.06 % nasal spray Place 2 sprays into both nostrils 4 (four) times daily. 11/05/23   Bernardino Ditch, NP  losartan  (COZAAR ) 50 MG tablet Take 1 tablet by mouth once daily 10/15/23   Edman, Marsa PARAS, DO  MAGNESIUM -OXIDE 400 (240 Mg) MG tablet Take 1 tablet by mouth once daily 10/15/23   Edman, Marsa PARAS, DO  metoprolol  succinate (TOPROL -XL) 25 MG 24 hr tablet Take 1 tablet (25 mg total) by mouth daily. Take with or immediately following a meal. 10/11/23   Karamalegos, Marsa PARAS, DO  Multiple Vitamin (MULTI-VITAMINS) TABS Take by mouth.    [provider]  Nebulizers (PARI PRONEB MAX LC SPRINT) MISC  09/23/23   [provider]  omeprazole  (PRILOSEC) 20 MG capsule TAKE 1 CAPSULE BY MOUTH ONCE DAILY BEFORE BREAKFAST 11/29/23   Karamalegos, Marsa PARAS, DO  potassium chloride  SA (KLOR-CON  M) 20 MEQ tablet Take 1 tablet by mouth once daily 09/10/23   Karamalegos, Marsa PARAS, DO  rosuvastatin  (CRESTOR ) 40 MG tablet Take 40 mg by mouth daily. 08/28/23 08/27/24  [provider]  RYBELSUS  3 MG TABS Take 1 tablet (3 mg total) by mouth daily. Patient not taking: Reported on 12/06/2023 09/04/23   Edman Marsa PARAS, DO  saccharomyces boulardii (FLORASTOR) 250 MG capsule Take 1 capsule (250 mg total) by mouth 2 (two) times daily. 10/23/23   Vivienne Delon HERO, PA-C  SPIRIVA  HANDIHALER 18 MCG inhalation capsule Place 1 capsule (18 mcg total) into inhaler and inhale daily. 12/20/22   Karamalegos, Marsa PARAS, DO  SYMBICORT 160-4.5 MCG/ACT inhaler Inhale 2 puffs into the lungs. 04/02/22    [provider]  venlafaxine  XR (EFFEXOR -XR) 75 MG 24 hr capsule TAKE 3 CAPSULES BY MOUTH ONCE DAILY WITH BREAKFAST 10/25/23   Edman Marsa PARAS, DO    Family History Family History  Problem Relation Age of Onset   Ovarian cancer Mother 31       deceased 30   Breast cancer Mother 58   Stroke Father    Lung cancer Paternal Aunt    Lung cancer Cousin     Social History Social History   Tobacco Use   Smoking status: Every Day    Current packs/day: 0.50    Average packs/day: 0.5 packs/day for 52.0 years (26.0 ttl pk-yrs)    Types: Cigarettes   Smokeless tobacco: Never   Tobacco comments:    5 ciggarette a day--03/02/2020  Vaping Use   Vaping status: Some Days  Substance Use Topics  Alcohol use: No   Drug use: No     Allergies   Moxifloxacin, Levofloxacin, and Amoxicillin -pot clavulanate   Review of Systems Review of Systems  Constitutional:  Negative for fever.  HENT:  Positive for congestion and sinus pain. Negative for ear pain, rhinorrhea and sore throat.   Eyes:  Negative for visual disturbance.  Respiratory:  Negative for cough.   Neurological:  Positive for headaches. Negative for dizziness.     Physical Exam Triage Vital Signs ED Triage Vitals  Encounter Vitals Group     BP      Girls Systolic BP Percentile      Girls Diastolic BP Percentile      Boys Systolic BP Percentile      Boys Diastolic BP Percentile      Pulse      Resp      Temp      Temp src      SpO2      Weight      Height      Head Circumference      Peak Flow      Pain Score      Pain Loc      Pain Education      Exclude from Growth Chart    No data found.  Updated Vital Signs BP (!) 158/85 (BP Location: Right Arm)   Pulse 84   Temp 99 F (37.2 C) (Oral)   Resp 16   Ht 5' 2 (1.575 m)   Wt 182 lb 1.6 oz (82.6 kg)   SpO2 95%   BMI 33.31 kg/m   Visual Acuity Right Eye Distance:   Left Eye Distance:   Bilateral Distance:    Right Eye Near:   Left  Eye Near:    Bilateral Near:     Physical Exam Vitals and nursing note reviewed.  Constitutional:      Appearance: Normal appearance. She is not ill-appearing.  HENT:     Head: Normocephalic and atraumatic.     Right Ear: Tympanic membrane, ear canal and external ear normal. There is no impacted cerumen.     Left Ear: Tympanic membrane, ear canal and external ear normal. There is no impacted cerumen.     Ears:     Comments: Mild amount of cerumen in the left external auditory canal, however, the tympanic membrane is clearly visualized.  Both TMs are pearly gray in appearance with normal light reflex.    Nose: Nose normal. No congestion or rhinorrhea.     Mouth/Throat:     Mouth: Mucous membranes are dry.     Pharynx: Oropharynx is clear. No oropharyngeal exudate or posterior oropharyngeal erythema.     Comments: Sticky oral mucous membranes. Skin:    General: Skin is warm and dry.     Capillary Refill: Capillary refill takes less than 2 seconds.     Findings: No rash.  Neurological:     General: No focal deficit present.     Mental Status: She is alert and oriented to person, place, and time.      UC Treatments / Results  Labs (all labs ordered are listed, but only abnormal results are displayed) Labs Reviewed  CBC WITH DIFFERENTIAL/PLATELET - Abnormal; Notable for the following components:      Result Value   Hemoglobin 11.8 (*)    RDW 16.0 (*)    All other components within normal limits  COMPREHENSIVE METABOLIC PANEL WITH GFR - Abnormal; Notable for the  following components:   Glucose, Bld 141 (*)    Calcium  8.8 (*)    All other components within normal limits    EKG   Radiology No results found.  Procedures Procedures (including critical care time)  Medications Ordered in UC Medications - No data to display  Initial Impression / Assessment and Plan / UC Course  I have reviewed the triage vital signs and the nursing notes.  Pertinent labs & imaging  results that were available during my care of the patient were reviewed by me and considered in my medical decision making (see chart for details).   Patient is a pleasant, nontoxic-appearing 73 year old female presenting for evaluation of ongoing headache.  The patient reports that the headache is in the center of her forehead and sometimes over the left part of her forehead but has not traveled anywhere else.  She reports that it will go away and it does respond to Tylenol .  She is reluctant to take medication because she is concerned about will interfere with her prescription medications.  She has also had poor sleep quality and she does not drink any water during the day.  She and her daughter report that she will drink approximately 1 gallon of tea each day.  The patient had a maxillofacial CT without contrast on 12/09/2023 that was ordered by her allergist due to the fact that she has a deviated septum to the left.  There is no report available in epic but a review of the images do not reveal any opacity of her maxillary, frontal, ethmoid, or sphenoidal sinuses.  No other acute abnormalities noted.  The patient is concerned about possible sepsis because she was hospitalized back in July with sepsis and it started out with a headache.  She has not had fever but she has had an elevated temp of 99.4-99.6 at home.  She is currently 99.0 here in clinic.  No lower respiratory symptoms.  Her upper respiratory tree is benign on physical exam.  She has no maxillary or frontal sinus tenderness.  I suspect that the patient's ongoing headache may be a combination of poor sleep quality coupled with mild dehydration.  Her oral mucous membranes are sticky.  I will order a CBC and CBC to evaluate for any signs of systemic infection, worsening anemia, or electrolyte abnormalities.  CBC shows a normal white count of 7.7.  Hemoglobin is mildly low at 11.8 but hematocrit is normal at 36.6.  RDW is slightly increased at 16.   Platelets normal at 243.  No abnormalities to the differential.  Reviewing labs in epic the patient's hemoglobin is at interval improvement over labs from 3 weeks ago.  Her hematocrit is also normalized in this timeframe.  CMP shows normal electrolytes, renal function, and transaminases.  Glucose is mildly elevated at 141 and calcium  is mildly decreased at 8.8.  I will discharge patient home with a diagnosis of headache.  I will encourage her to use Tylenol  as needed for the headache, discussed sleep hygiene with her to improve her sleep quality which could improve her headaches, and also encouraged her to cut back on the tea and increase her water intake to improve hydration.   Final Clinical Impressions(s) / UC Diagnoses   Final diagnoses:  Nonintractable episodic headache, unspecified headache type  Dehydration     Discharge Instructions      Your blood work was unremarkable.  Your anemia has actually improved over the last 3 weeks since you had blood work.  You have no evidence of infection.  I suspect that your headache is accommodation of poor sleep quality and poor water intake.  Your oral mucous membranes were sticky and your lips were dry which indicates mild dehydration.  This can lead to headaches.  Cut back on the amount of tea that you are drinking and substitute water in its place to improve your hydration.  Try and drink at least eight 8 ounce glasses of water daily and see if this helps with your headaches.  You may use over-the-counter Tylenol  according to the package instructions as needed for headache pain.  Your poor sleep quality could also be contributing to your headaches.  I would recommend that you speak with your primary care provider about this as there are medications that are safe to take with your current listed medications which could improve your sleep quality.  Make sure that you are disengaging from electronics 1 to 2 hours before your normal bedtime.   This gives your brain a chance to settle down and can help induce sleep.  Exercising 3 to 4 hours prior to bedtime can also help bring about relaxation and induce a sense of sleepiness.  A hot shower 20 minutes before bed can also help induce a sense of sleepiness and may improve your sleep quality.     ED Prescriptions   None    PDMP not reviewed this encounter.   Bernardino Ditch, NP 12/14/23 1116

## 2023-12-14 NOTE — Discharge Instructions (Addendum)
 Your blood work was unremarkable.  Your anemia has actually improved over the last 3 weeks since you had blood work.  You have no evidence of infection.  I suspect that your headache is accommodation of poor sleep quality and poor water intake.  Your oral mucous membranes were sticky and your lips were dry which indicates mild dehydration.  This can lead to headaches.  Cut back on the amount of tea that you are drinking and substitute water in its place to improve your hydration.  Try and drink at least eight 8 ounce glasses of water daily and see if this helps with your headaches.  You may use over-the-counter Tylenol  according to the package instructions as needed for headache pain.  Your poor sleep quality could also be contributing to your headaches.  I would recommend that you speak with your primary care provider about this as there are medications that are safe to take with your current listed medications which could improve your sleep quality.  Make sure that you are disengaging from electronics 1 to 2 hours before your normal bedtime.  This gives your brain a chance to settle down and can help induce sleep.  Exercising 3 to 4 hours prior to bedtime can also help bring about relaxation and induce a sense of sleepiness.  A hot shower 20 minutes before bed can also help induce a sense of sleepiness and may improve your sleep quality.

## 2023-12-26 ENCOUNTER — Ambulatory Visit: Payer: Medicare PPO

## 2024-01-01 ENCOUNTER — Telehealth: Payer: Self-pay | Admitting: Oncology

## 2024-01-01 ENCOUNTER — Ambulatory Visit

## 2024-01-01 NOTE — Telephone Encounter (Signed)
 Pt daughter called and stated pt has MD appt tomorrow but pt gets seen for iron  so wanted to know why there wasn't a lab appt.  The notes on the MD appt states 1 year: Ct scan of chest w contrast, see finn 1 week later.  There was a CT scheduled on 9/25 but it was canceled by patient.   I told the pt daughter all of this information and that I will send a message to Dr.Finnegan and his team and see if we need to update the appts.  Pt daughter would like to be called back with the update.

## 2024-01-02 ENCOUNTER — Inpatient Hospital Stay: Payer: Medicare PPO | Attending: Oncology | Admitting: Oncology

## 2024-01-02 ENCOUNTER — Telehealth: Payer: Self-pay

## 2024-01-02 NOTE — Patient Outreach (Signed)
 Patient was not at home, requested call back later in the day

## 2024-01-03 ENCOUNTER — Encounter: Payer: Self-pay | Admitting: Family Medicine

## 2024-01-03 ENCOUNTER — Ambulatory Visit: Admitting: Family Medicine

## 2024-01-03 VITALS — BP 144/78 | HR 84 | Ht 62.0 in | Wt 192.0 lb

## 2024-01-03 DIAGNOSIS — E1169 Type 2 diabetes mellitus with other specified complication: Secondary | ICD-10-CM

## 2024-01-03 DIAGNOSIS — J432 Centrilobular emphysema: Secondary | ICD-10-CM

## 2024-01-03 DIAGNOSIS — J441 Chronic obstructive pulmonary disease with (acute) exacerbation: Secondary | ICD-10-CM | POA: Diagnosis not present

## 2024-01-03 MED ORDER — RYBELSUS 3 MG PO TABS: 3.0000 mg | ORAL_TABLET | Freq: Every day | ORAL | 0 refills | Status: AC

## 2024-01-03 MED ORDER — AZITHROMYCIN 250 MG PO TABS: ORAL_TABLET | ORAL | 0 refills | Status: DC

## 2024-01-03 MED ORDER — RYBELSUS 3 MG PO TABS: 3.0000 mg | ORAL_TABLET | Freq: Every day | ORAL | Status: DC

## 2024-01-03 NOTE — Progress Notes (Signed)
 Subjective:    Patient ID: Elizabeth [name], female    DOB: Jan 01, 1951, 73 y.o.   MRN: [id number]  Elizabeth [name] is a 73 y.o. female presenting on 01/03/2024 for Medical Management of Chronic Issues   HPI  Discussed the use of AI scribe software for clinical note transcription with the patient, who gave verbal consent to proceed.  History of Present Illness   Elizabeth [name] is a 73 year old female with COPD who presents with shortness of breath and congestion.  Acute COPD Exacerbation Centrilobular Emphysema Followed by [facility], limited with transportation now Dyspnea and respiratory symptoms - Shortness of breath and congestion, described as a sensation of 'smothering' - Symptoms worsen with minimal exertion, such as walking to the bathroom - Productive cough with mucus expectoration - History of COPD with a flare-up in August, treated with steroids, antibiotics, and breathing treatments - No recent pulmonology appointment due to scheduling issues; awaiting rescheduled pulmonary function testing - Uses rescue inhaler, Spiriva , and Symbicort for COPD management - Quit smoking after a severe health scare and hospitalization  Weight gain and abdominal pressure - Recent weight gain of approximately ten pounds - Increased appetite following recovery from a period of illness - Sensation of abdominal pressure and difficulty breathing associated with weight gain  Headache - Daily headaches, described as tension-type and located in the frontal region - No medication taken for headaches - Regular consumption of tea containing caffeine  Type 2 Diabetes Last A1c 6-7 range Not on therapy Weight gain now Previously lost sample Rybelsus  Interested to restart      12/06/2023   12:38 PM 11/29/2023   11:56 AM 11/25/2023    2:28 PM  Depression screen PHQ 2/9  Decreased Interest 0 0 0  Down, Depressed, Hopeless 1 0 0  PHQ - 2 Score 1 0 0  Altered sleeping 1    Tired,  decreased energy 1    Feeling bad or failure about yourself  0    Trouble concentrating 0    Moving slowly or fidgety/restless 0    Suicidal thoughts 0    PHQ-9 Score 3    Difficult doing work/chores Not difficult at all         12/06/2023   12:41 PM 11/29/2023   11:56 AM 11/25/2023    2:39 PM 11/15/2023    2:28 PM  GAD 7 : Generalized Anxiety Score  Nervous, Anxious, on Edge 1 0 0 0  Control/stop worrying 0 0 0 1  Worry too much - different things 0 0 0 1  Trouble relaxing 0 0 0 0  Restless 0 0 0 0  Easily annoyed or irritable 0 0 0 0  Afraid - awful might happen 1 0 0 0  Total GAD 7 Score 2 0 0 2  Anxiety Difficulty Not difficult at all Not difficult at all Not difficult at all Not difficult at all    Social History   Tobacco Use   Smoking status: Former    Current packs/day: 0.00    Average packs/day: 0.5 packs/day for 52.0 years (26.0 ttl pk-yrs)    Types: Cigarettes    Start date: 12/02/1971    Quit date: 12/02/2023    Years since quitting: 0.0   Smokeless tobacco: Never   Tobacco comments:    5 ciggarette a day--03/02/2020  Vaping Use   Vaping status: Some Days  Substance Use Topics   Alcohol use: No   Drug use: No    Review  of Systems Per HPI unless specifically indicated above     Objective:    BP (!) 144/78 (BP Location: Left Arm, Cuff Size: Normal)   Pulse 84   Ht 5' 2 (1.575 m)   Wt 192 lb (87.1 kg)   SpO2 96%   BMI 35.12 kg/m   Wt Readings from Last 3 Encounters:  01/03/24 192 lb (87.1 kg)  12/14/23 182 lb 1.6 oz (82.6 kg)  11/22/23 182 lb (82.6 kg)    Physical Exam Vitals and nursing note reviewed.  Constitutional:      General: She is not in acute distress.    Appearance: She is well-developed. She is not diaphoretic.     Comments: Well-appearing, comfortable, cooperative  HENT:     Head: Normocephalic and atraumatic.  Eyes:     General:        Right eye: No discharge.        Left eye: No discharge.     Conjunctiva/sclera: Conjunctivae  normal.  Neck:     Thyroid : No thyromegaly.  Cardiovascular:     Rate and Rhythm: Normal rate and regular rhythm.     Heart sounds: Normal heart sounds. No murmur heard. Pulmonary:     Effort: Pulmonary effort is normal. No respiratory distress.     Breath sounds: No wheezing or rales.     Comments: Reduced air movement diffuse near baseline. No focal lung abnormality Musculoskeletal:        General: Normal range of motion.     Cervical back: Normal range of motion and neck supple.  Lymphadenopathy:     Cervical: No cervical adenopathy.  Skin:    General: Skin is warm and dry.     Findings: No erythema or rash.  Neurological:     Mental Status: She is alert and oriented to person, place, and time.  Psychiatric:        Behavior: Behavior normal.     Comments: Well groomed, good eye contact, normal speech and thoughts     Results for orders placed or performed during the hospital encounter of 12/14/23  CBC with Differential   Collection Time: 12/14/23 10:31 AM  Result Value Ref Range   WBC 7.7 4.0 - 10.5 K/uL   RBC 4.36 3.87 - 5.11 MIL/uL   Hemoglobin 11.8 (L) 12.0 - 15.0 g/dL   HCT 63.3 63.9 - 53.9 %   MCV 83.9 80.0 - 100.0 fL   MCH 27.1 26.0 - 34.0 pg   MCHC 32.2 30.0 - 36.0 g/dL   RDW 83.9 (H) 88.4 - 84.4 %   Platelets 243 150 - 400 K/uL   nRBC 0.0 0.0 - 0.2 %   Neutrophils Relative % 64 %   Neutro Abs 4.9 1.7 - 7.7 K/uL   Lymphocytes Relative 28 %   Lymphs Abs 2.1 0.7 - 4.0 K/uL   Monocytes Relative 8 %   Monocytes Absolute 0.6 0.1 - 1.0 K/uL   Eosinophils Relative 0 %   Eosinophils Absolute 0.0 0.0 - 0.5 K/uL   Basophils Relative 0 %   Basophils Absolute 0.0 0.0 - 0.1 K/uL   Immature Granulocytes 0 %   Abs Immature Granulocytes 0.03 0.00 - 0.07 K/uL  Comprehensive metabolic panel   Collection Time: 12/14/23 10:31 AM  Result Value Ref Range   Sodium 138 135 - 145 mmol/L   Potassium 3.9 3.5 - 5.1 mmol/L   Chloride 100 98 - 111 mmol/L   CO2 30 22 - 32 mmol/L  Glucose, Bld 141 (H) 70 - 99 mg/dL   BUN 11 8 - 23 mg/dL   Creatinine, Ser 9.18 0.44 - 1.00 mg/dL   Calcium  8.8 (L) 8.9 - 10.3 mg/dL   Total Protein 7.3 6.5 - 8.1 g/dL   Albumin 3.9 3.5 - 5.0 g/dL   AST 25 15 - 41 U/L   ALT 17 0 - 44 U/L   Alkaline Phosphatase 96 38 - 126 U/L   Total Bilirubin 0.5 0.0 - 1.2 mg/dL   GFR, Estimated >39 >39 mL/min   Anion gap 8 5 - 15      Assessment & Plan:   Problem List Items Addressed This Visit     Centrilobular emphysema (HCC)   Relevant Medications   azithromycin  (ZITHROMAX  Z-PAK) 250 MG tablet   Other Relevant Orders   Ambulatory referral to Pulmonology   COPD with acute exacerbation (HCC) - Primary   Relevant Medications   azithromycin  (ZITHROMAX  Z-PAK) 250 MG tablet   Type 2 diabetes mellitus with other specified complication (HCC)   Relevant Medications   RYBELSUS  3 MG TABS     Chronic Obstructive Pulmonary Disease (COPD) with acute exacerbation Centrilobular Emphysema vs Chronic Bronchitis  Acute COPD exacerbation with shortness of breath, congestion, and productive cough. Recent hospitalization treated with steroids and antibiotics. Current oxygen saturation at 96%.  Chronic problem with significant impact on quality of life with her COPD with recurrent exacerbations and physical / functional limitations.  Managed by [facility] previously. Limited success thus far on treatment. Has upcoming PFT she may need to re-schedule. She is looking to Transfer her care to local Pulmonology now at [facility]  Continue current maintenance therapy, have not changed therapy. Continue Spiriva , Symbicort   - Prescribe azithromycin  (Z-Pak). - Refer to [facility] for pulmonary function testing and pulmonology care. - Advise to keep current appointment with [facility] until rescheduled with [facility]. - Avoid prednisone  due to adverse effects and patient's preference. We could add prednisone  burst again in future if symptoms become severe, goal to  limit  Abnormal weight gain Recent 10-pound weight gain, possibly affecting breathing. Attributed to increased appetite post-illness and steroid use. - Advise dietary modifications.  Chronic daily headache Chronic headaches possibly linked to chronic cough, breathing issues, or caffeine use. Previous sinus CT scan unremarkable. - Discuss caffeine's role in headaches and advise monitoring intake.  Sleep disturbance Sleep disturbance with frequent awakenings, possibly related to COPD symptoms and headaches. Recent improvement post-hospitalization. - Monitor sleep patterns and address COPD symptoms to improve sleep.  Type 2 diabetes mellitus Type 2 diabetes with mild hyperglycemia noted. Rybelsus  planned for weight and appetite control. Last A1c 6.7 controlled - Prescribe Rybelsus  3 mg daily before meals. (She did not take it before, lost sample) - Increase Rybelsus  to 7 mg after 30 days.        Orders Placed This Encounter  Procedures   Ambulatory referral to Pulmonology    Referral Priority:   Routine    Referral Type:   Consultation    Referral Reason:   Specialty Services Required    Requested Specialty:   Pulmonary Disease    Number of Visits Requested:   1    Meds ordered this encounter  Medications   DISCONTD: RYBELSUS  3 MG TABS    Sig: Take 1 tablet (3 mg total) by mouth daily.   azithromycin  (ZITHROMAX  Z-PAK) 250 MG tablet    Sig: Take 2 tabs (500mg  total) on Day 1. Take 1 tab (250mg ) daily  for next 4 days.    Dispense:  6 tablet    Refill:  0   RYBELSUS  3 MG TABS    Sig: Take 1 tablet (3 mg total) by mouth daily before breakfast. 30 min before med or food. For 30 days, then dose will increase.    Dispense:  30 tablet    Refill:  0    Follow up plan: Return if symptoms worsen or fail to improve.   Marsa Officer, DO [facility] 01/03/2024, 10:51 AM

## 2024-01-03 NOTE — Patient Instructions (Addendum)
 Thank you for coming to the office today.  Reduce Tea intake and unfortunately poor sleep will affect headaches.  Try to limit tea and improve water.  Referral to [facility]  [facility] at [facility] [address] [place],  [place]  [address] Main: [phone]  Keep current apt with [facility] until you are re-scheduled with [place].  FOR COPD Start Azithromycin  Z pak (antibiotic) 2 tabs day 1, then 1 tab x 4 days, complete entire course even if improved  -------------------------  Starting dose is 3mg  once daily, first thing in morning on empty stomach, sip of water, no other medicines. Wait 30 min before first meal of day.  After 30 days we need to increase dose up to 7 mg - call us  to request new rx.  Please schedule a Follow-up Appointment to: Return if symptoms worsen or fail to improve.  If you have any other questions or concerns, please feel free to call the office or send a message through MyChart. You may also schedule an earlier appointment if necessary.  Additionally, you may be receiving a survey about your experience at our office within a few days to 1 week by e-mail or mail. We value your feedback.  Marsa Officer, DO [facility], [place]

## 2024-01-07 ENCOUNTER — Ambulatory Visit: Admitting: Family Medicine

## 2024-01-13 ENCOUNTER — Ambulatory Visit: Admitting: Family Medicine

## 2024-01-21 ENCOUNTER — Ambulatory Visit: Payer: Self-pay | Admitting: Physician Assistant

## 2024-01-21 ENCOUNTER — Ambulatory Visit
Admission: EM | Admit: 2024-01-21 | Discharge: 2024-01-21 | Disposition: A | Discharge status: Home / Self Care | Attending: Physician Assistant | Admitting: Physician Assistant

## 2024-01-21 ENCOUNTER — Other Ambulatory Visit: Payer: Self-pay | Admitting: Family Medicine

## 2024-01-21 ENCOUNTER — Ambulatory Visit (INDEPENDENT_AMBULATORY_CARE_PROVIDER_SITE_OTHER)

## 2024-01-21 DIAGNOSIS — I10 Essential (primary) hypertension: Secondary | ICD-10-CM | POA: Diagnosis not present

## 2024-01-21 DIAGNOSIS — E538 Deficiency of other specified B group vitamins: Secondary | ICD-10-CM

## 2024-01-21 DIAGNOSIS — C3492 Malignant neoplasm of unspecified part of left bronchus or lung: Secondary | ICD-10-CM | POA: Diagnosis not present

## 2024-01-21 DIAGNOSIS — J441 Chronic obstructive pulmonary disease with (acute) exacerbation: Secondary | ICD-10-CM

## 2024-01-21 DIAGNOSIS — R0602 Shortness of breath: Secondary | ICD-10-CM

## 2024-01-21 DIAGNOSIS — D510 Vitamin B12 deficiency anemia due to intrinsic factor deficiency: Secondary | ICD-10-CM

## 2024-01-21 DIAGNOSIS — R0989 Other specified symptoms and signs involving the circulatory and respiratory systems: Secondary | ICD-10-CM | POA: Diagnosis not present

## 2024-01-21 MED ORDER — AMOXICILLIN-POT CLAVULANATE 875-125 MG PO TABS: 1.0000 | ORAL_TABLET | Freq: Two times a day (BID) | ORAL | 0 refills | Status: DC

## 2024-01-21 MED ORDER — PREDNISONE 20 MG PO TABS: 40.0000 mg | ORAL_TABLET | Freq: Every day | ORAL | 0 refills | Status: AC

## 2024-01-21 MED ORDER — PROMETHAZINE-DM 6.25-15 MG/5ML PO SYRP: 5.0000 mL | ORAL_SOLUTION | Freq: Four times a day (QID) | ORAL | 0 refills | Status: AC | PRN

## 2024-01-21 NOTE — ED Provider Notes (Signed)
 [facility]    CSN: [id number] Arrival date & time: 01/21/24  9146      History   Chief Complaint Chief Complaint  Patient presents with   Shortness of Breath    HPI Elizabeth [name] is a 73 y.o. female with history of COPD/emphysema, factor XI deficiency, hyperlipidemia. Depression, T2DM, and lung cancer (in remission).   Patient presents today for 1 week history of productive cough, congestion and increased shortness of breath from baseline. No fever,  but she has had sweats occasionally. Denies sore throat, sinus pain, chest pain.  She has been performing nebs four times daily as well as using Symbicort and Spiriva  as directed by [employer]. Also, she uses oxygen at night.  Patient seen 01/03/24 by PCP for COPD exacerbation and treated with azithromycin . Seen 11/22/23 for COPD exacerbation and treated with doxycycline  and prednisone .    Upcoming appointment with pulmonology in 1 week. Patient is to have PFTs done at that time.   HPI  Past Medical History:  Diagnosis Date   Allergy    Anxiety    B12 deficiency    Carpal tunnel syndrome    Centrilobular emphysema (HCC)    COPD (chronic obstructive pulmonary disease) (HCC)    Depression    Genetic testing    Common Cancers panel (47 genes) @ [facility] - No pathogenic mutations detected   HLD (hyperlipidemia)    Hyperlipidemia    Hypertension    Iron  deficiency anemia 10/16/2016   Lipoma of skin    Lung nodule    Normocytic anemia 10/16/2016   QT prolongation    Substance abuse (HCC)     Patient Active Problem List   Diagnosis Date Noted   COPD with acute exacerbation (HCC) 10/18/2023   Weakness generalized 10/09/2023   Type 2 diabetes mellitus with other specified complication (HCC) 10/06/2021   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) 07/04/2021   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) 07/04/2021   OSA on CPAP 06/26/2019   Combined form of senile cataract  of right eye 03/11/2018   Senile nuclear sclerosis, left 03/11/2018   Substance abuse (HCC)    Genetic testing    Pernicious anemia 11/27/2016   Normocytic anemia 10/16/2016   Iron  deficiency anemia 10/16/2016   Centrilobular emphysema (HCC)    Major depressive disorder, recurrent, moderate (HCC) 09/24/2016   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   Bilateral carpal tunnel syndrome 05/27/2015   Bilateral hand numbness 05/17/2015   Fracture of humerus, proximal, closed 03/23/2013   Lipoma of skin and subcutaneous tissue 12/26/2011   B12 deficiency 02/15/2011   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      Home Medications    Prior to Admission medications   Medication Sig Start Date End Date Taking? Authorizing Provider  amoxicillin -clavulanate (AUGMENTIN ) 875-125 MG tablet Take 1 tablet by mouth every 12 (twelve) hours for 7 days. 01/21/24 01/28/24 Yes Arvis Huxley B, PA-C  predniSONE  (DELTASONE )  20 MG tablet Take 2 tablets (40 mg total) by mouth daily for 5 days. 01/21/24 01/26/24 Yes Arvis Jolan NOVAK, PA-C  promethazine-dextromethorphan (PROMETHAZINE-DM) 6.25-15 MG/5ML syrup Take 5 mLs by mouth 4 (four) times daily as needed. 01/21/24  Yes Arvis Jolan B, PA-C  albuterol  (VENTOLIN  HFA) 108 (90 Base) MCG/ACT inhaler Inhale 2 puffs into the lungs every 6 (six) hours as needed for wheezing or shortness of breath. 03/21/23   Karamalegos, Marsa PARAS, DO  apixaban  (ELIQUIS ) 5 MG TABS tablet Take 1 tablet (5 mg total) by mouth  2 (two) times daily. 10/23/23   Burnette, Jennifer M, PA-C  ARIPiprazole  (ABILIFY ) 5 MG tablet Take 1 tablet by mouth once daily 11/15/23   Karamalegos, Marsa PARAS, DO  azithromycin  (ZITHROMAX  Z-PAK) 250 MG tablet Take 2 tabs (500mg  total) on Day 1. Take 1 tab (250mg ) daily for next 4 days. 01/03/24   Karamalegos, Marsa PARAS, DO  busPIRone  (BUSPAR ) 5 MG tablet Take 1 tablet (5 mg total) by mouth 3 (three) times daily. 10/23/23   Vivienne Delon HERO, PA-C  cyanocobalamin  (VITAMIN B12) 1000 MCG/ML injection Inject 1 mL (1,000 mcg total) into the muscle every 30 (thirty) days. 10/04/23   Reyes, Shaylo, PA-C  furosemide  (LASIX ) 40 MG tablet Take 40 mg by mouth.  Take 1 tablet (40 mg total) by mouth once daily 08/28/23   [provider]  ipratropium (ATROVENT ) 0.06 % nasal spray Place 2 sprays into both nostrils 4 (four) times daily. 11/05/23   Bernardino Ditch, NP  losartan  (COZAAR ) 50 MG tablet Take 1 tablet by mouth once daily 10/15/23   Edman, Marsa PARAS, DO  MAGNESIUM -OXIDE 400 (240 Mg) MG tablet Take 1 tablet by mouth once daily 10/15/23   Edman Marsa PARAS, DO  metoprolol  succinate (TOPROL -XL) 25 MG 24 hr tablet Take 1 tablet (25 mg total) by mouth daily. Take with or immediately following a meal. 10/11/23   Karamalegos, Marsa PARAS, DO  Multiple Vitamin (MULTI-VITAMINS) TABS Take by mouth.    [provider]  Nebulizers (PARI PRONEB MAX LC SPRINT) MISC  09/23/23   [provider]  omeprazole  (PRILOSEC) 20 MG capsule TAKE 1 CAPSULE BY MOUTH ONCE DAILY BEFORE BREAKFAST 11/29/23   Karamalegos, Marsa PARAS, DO  potassium chloride  SA (KLOR-CON  M) 20 MEQ tablet Take 1 tablet by mouth once daily 09/10/23   Edman Marsa PARAS, DO  rosuvastatin  (CRESTOR ) 40 MG tablet Take 40 mg by mouth daily. 08/28/23 08/27/24  [provider]  RYBELSUS  3 MG TABS Take 1 tablet (3 mg total) by mouth daily before breakfast. 30 min before med or food. For 30 days, then dose will increase.  01/03/24   Karamalegos, Marsa PARAS, DO  saccharomyces boulardii (FLORASTOR) 250 MG capsule Take 1 capsule (250 mg total) by mouth 2 (two) times daily. 10/23/23   Vivienne Delon HERO, PA-C  SPIRIVA  HANDIHALER 18 MCG inhalation capsule Place 1 capsule (18 mcg total) into inhaler and inhale daily. 12/20/22   Karamalegos, Marsa PARAS, DO  SYMBICORT 160-4.5 MCG/ACT inhaler Inhale 2 puffs into the lungs. 04/02/22   [provider]  venlafaxine  XR (EFFEXOR -XR) 75 MG 24 hr capsule TAKE 3 CAPSULES BY MOUTH ONCE DAILY WITH BREAKFAST 10/25/23   Edman Marsa PARAS, DO    Family History Family History  Problem Relation Age of Onset   Ovarian cancer Mother 60       deceased 38   Breast cancer Mother 67   Stroke Father    Lung cancer Paternal Aunt  Lung cancer Cousin     Social History Social History   Tobacco Use   Smoking status: Former    Current packs/day: 0.00    Average packs/day: 0.5 packs/day for 52.0 years (26.0 ttl pk-yrs)    Types: Cigarettes    Start date: 12/02/1971    Quit date: 12/02/2023    Years since quitting: 0.1   Smokeless tobacco: Never   Tobacco comments:    5 ciggarette a day--03/02/2020  Vaping Use   Vaping status: Some Days  Substance Use Topics   Alcohol use: No   Drug use: No     Allergies   Moxifloxacin, Levofloxacin, and Amoxicillin -pot clavulanate   Review of Systems Review of Systems  Constitutional:  Positive for fatigue. Negative for chills, diaphoresis and fever.  HENT:  Positive for congestion. Negative for ear pain, rhinorrhea, sinus pain and sore throat.   Respiratory:  Positive for cough, shortness of breath and wheezing.   Cardiovascular:  Negative for chest pain.  Gastrointestinal:  Negative for abdominal pain, nausea and vomiting.  Musculoskeletal:  Negative for arthralgias and myalgias.  Skin:  Negative for rash.  Neurological:  Negative for weakness and headaches.  Hematological:  Negative for adenopathy.     Physical  Exam Triage Vital Signs ED Triage Vitals  Encounter Vitals Group     BP      Girls Systolic BP Percentile      Girls Diastolic BP Percentile      Boys Systolic BP Percentile      Boys Diastolic BP Percentile      Pulse      Resp      Temp      Temp src      SpO2      Weight      Height      Head Circumference      Peak Flow      Pain Score      Pain Loc      Pain Education      Exclude from Growth Chart    No data found.  Updated Vital Signs BP (!) 183/103 (BP Location: Right Arm)   Pulse 83   Temp 98.8 F (37.1 C) (Oral)   Resp 20   Ht 5' 2 (1.575 m)   Wt 192 lb (87.1 kg)   SpO2 94%   BMI 35.12 kg/m   Physical Exam Vitals and nursing note reviewed.  Constitutional:      General: She is not in acute distress.    Appearance: Normal appearance. She is obese. She is not ill-appearing or toxic-appearing.  HENT:     Head: Normocephalic and atraumatic.     Nose: Nose normal.     Mouth/Throat:     Mouth: Mucous membranes are moist.     Pharynx: Oropharynx is clear.  Eyes:     General: No scleral icterus.       Right eye: No discharge.        Left eye: No discharge.     Conjunctiva/sclera: Conjunctivae normal.  Cardiovascular:     Rate and Rhythm: Normal rate and regular rhythm.     Heart sounds: Normal heart sounds.  Pulmonary:     Effort: Pulmonary effort is normal. No respiratory distress (speaking in full and complete sentences).     Breath sounds: Decreased air movement present. Wheezing (mild scattered bilaterally) present.  Musculoskeletal:     Cervical back: Neck supple.  Skin:    General: Skin is  dry.  Neurological:     General: No focal deficit present.     Mental Status: She is alert. Mental status is at baseline.     Motor: No weakness.     Gait: Gait normal.  Psychiatric:        Mood and Affect: Mood normal.        Behavior: Behavior normal.      UC Treatments / Results  Labs (all labs ordered are listed, but only abnormal results are  displayed) Labs Reviewed - No data to display  EKG   Radiology DG Chest 2 View Result Date: 01/21/2024 CLINICAL DATA:  Increased shortness of breath for the past week. History of left lung cancer and radiation therapy on previous reports. EXAM: CHEST - 2 VIEW COMPARISON:  11/22/2023. Chest CTA dated 10/19/2023. Chest CT dated 12/21/2022. FINDINGS: Stable mildly enlarged cardiac silhouette and mildly prominent pulmonary vasculature. Increased rib thickening and mottling along the outer aspect of the left mid thorax. 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 Small amount of adjacent scarring in the lung without significant change. The remainder of the lungs are clear. IMPRESSION: 1. No acute abnormality. 2. 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 3. Stable mild cardiomegaly and mild pulmonary vascular congestion. Electronically Signed   By: Elspeth Bathe M.D.   On: 01/21/2024 09:52    Procedures Procedures (including critical care time)  Medications Ordered in UC Medications - No data to display  Initial Impression / Assessment and Plan / UC Course  I have reviewed the triage vital signs and the nursing notes.  Pertinent labs & imaging results that were available during my care of the patient were reviewed by me and considered in my medical decision making (see chart for details).   73 y/o female with history of COPD and lung cancer (in remission) presents for 1 week history of cough, congestion and increased SOB from baseline. Seen 10/3 by PCP and treated with azithromycin  and at end of August and treated with doxycyline for COPD exacerbations. Upcoming appointment with pulmonology in 1 week.  She is currently afebrile. BP 183/103. She takes metoprolol  and losartan .O2 saturation is 94%. No distress.  Speaking in full and complete sentences. Few scattered wheezes throughout.   Chest x-ray obtained. Negative for acute abnormality  COPD exacerbation. Sent Augmentin . Patient reports occasional nausea with this medicine, but would like to take it. Also sent prednisone  and promethazine DM. Keep pulmonology follow up. Reviewed ED precautions.  Over read chest x-rays shows no acute abnormalities. 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 This was previously noted on CT scan of chest from last year.  Chronic problem with significant impact on quality of life with her COPD with recurrent exacerbations and physical / functional limitations.    Final Clinical Impressions(s) / UC Diagnoses   Final diagnoses:  Shortness of breath  COPD exacerbation (HCC)  Essential hypertension     Discharge Instructions      - Initial read on x-ray does not show pneumonia.  Consistent with previous x-ray that was negative for pneumonia a couple months ago. - COPD is flared up. - Start Augmentin .  You state you can tolerate it despite it being on the allergy /intolerance list causing you nausea.  You should also take the prednisone .  Continue with your breathing  treatments and inhalers as directed by the pulmonologist and keep follow-up with pulmonologist. - If you develop temps over 100.4 degrees, increased weakness, increased chest pain, worsening breathing or are not improving in a couple days go to emergency department. - Blood pressure is very high at 183/103.  Continue taking your blood pressure medications and if consistently greater than 140/90 please bring this up with your primary care provider.  If you ever have any associated severe headaches, weakness, chest pain, dizziness, facial drooping, slurred speech, difficulty walking, confusion related to hypertension, call 911 or go immediately to the ER.     ED Prescriptions     Medication Sig Dispense Auth. Provider   amoxicillin -clavulanate  (AUGMENTIN ) 875-125 MG tablet Take 1 tablet by mouth every 12 (twelve) hours for 7 days. 14 tablet Arvis Huxley B, PA-C   predniSONE  (DELTASONE ) 20 MG tablet Take 2 tablets (40 mg total) by mouth daily for 5 days. 10 tablet Arvis Huxley NOVAK, PA-C   promethazine-dextromethorphan (PROMETHAZINE-DM) 6.25-15 MG/5ML syrup Take 5 mLs by mouth 4 (four) times daily as needed. 118 mL Arvis Huxley NOVAK, PA-C      PDMP not reviewed this encounter.   Arvis Huxley NOVAK, PA-C 01/21/24 1026

## 2024-01-21 NOTE — ED Triage Notes (Signed)
 Pt c/o sob x1 wk. Hx of COPD. Was seen on 10/3 by PCP. Given z-pak w/o relief. Has also tried inhalers & neb tx w/o relief.

## 2024-01-21 NOTE — Discharge Instructions (Addendum)
-   Initial read on x-ray does not show pneumonia.  Consistent with previous x-ray that was negative for pneumonia a couple months ago. - COPD is flared up. - Start Augmentin .  You state you can tolerate it despite it being on the allergy /intolerance list causing you nausea.  You should also take the prednisone .  Continue with your breathing treatments and inhalers as directed by the pulmonologist and keep follow-up with pulmonologist. - If you develop temps over 100.4 degrees, increased weakness, increased chest pain, worsening breathing or are not improving in a couple days go to emergency department. - Blood pressure is very high at 183/103.  Continue taking your blood pressure medications and if consistently greater than 140/90 please bring this up with your primary care provider.  If you ever have any associated severe headaches, weakness, chest pain, dizziness, facial drooping, slurred speech, difficulty walking, confusion related to hypertension, call 911 or go immediately to the ER.

## 2024-01-22 ENCOUNTER — Other Ambulatory Visit: Payer: Self-pay | Admitting: Family Medicine

## 2024-01-22 DIAGNOSIS — I10 Essential (primary) hypertension: Secondary | ICD-10-CM

## 2024-01-23 NOTE — Telephone Encounter (Signed)
 Requested medications are due for refill today.  yes  Requested medications are on the active medications list.  yes  Last refill. 10/04/2023 3mL 0 rf  Future visit scheduled.   no  Notes to clinic.  Rx signed by [name]    Requested Prescriptions  Pending Prescriptions Disp Refills   cyanocobalamin  (VITAMIN B12) 1000 MCG/ML injection [Pharmacy Med Name: Cyanocobalamin  1000 MCG/ML Injection Solution] 3 mL 0    Sig: INJECT  1ML  INTRAMUSCULARLY ONCE EVERY 30 DAYS     Endocrinology:  Vitamins - Vitamin B12 Failed - 01/23/2024 11:17 AM      Failed - HGB in normal range and within 360 days    Hemoglobin  Date Value Ref Range Status  12/14/2023 11.8 (L) 12.0 - 15.0 g/dL Final   HGB  Date Value Ref Range Status  05/06/2014 13.2 12.0 - 16.0 g/dL Final         Passed - HCT in normal range and within 360 days    HCT  Date Value Ref Range Status  12/14/2023 36.6 36.0 - 46.0 % Final  05/06/2014 40.0 35.0 - 47.0 % Final         Passed - B12 Level in normal range and within 360 days    Vitamin B-12  Date Value Ref Range Status  11/11/2023 369 180 - 914 pg/mL Final    Comment:    (NOTE) This assay is not validated for testing neonatal or myeloproliferative syndrome specimens for Vitamin B12 levels. Performed at [facility], [address] [place], [place] [address]          Passed - Valid encounter within last 12 months    Recent Outpatient Visits           2 weeks ago COPD with acute exacerbation [facility])   [place] [facility] [place], Marsa PARAS, DO   2 months ago Weakness generalized   [place] [facility] [place], Marsa PARAS, DO   3 months ago Atrial fibrillation with [facility])   [place] [facility] [place], Marsa PARAS, DO   3 months ago Weakness   [place] [facility], Parris LABOR, MD   4 months ago Annual physical exam   [place] [facility] [place] Marsa PARAS, DO              Signed Prescriptions Disp Refills   potassium chloride  SA (KLOR-CON  M20) 20 MEQ tablet 90 tablet 2    Sig: Take 1 tablet by mouth once daily     Endocrinology:  Minerals - Potassium Supplementation Passed - 01/23/2024 11:17 AM      Passed - K in normal range and within 360 days    Potassium  Date Value Ref Range Status  12/14/2023 3.9 3.5 - 5.1 mmol/L Final  05/06/2014 3.5 3.5 - 5.1 mmol/L Final         Passed - Cr in normal range and within 360 days    Creat  Date Value Ref Range Status  08/27/2023 0.81 0.60 - 1.00 mg/dL Final   Creatinine, Ser  Date Value Ref Range Status  12/14/2023 0.81 0.44 - 1.00 mg/dL Final   Creatinine, Urine  Date Value Ref Range Status  08/27/2023 229 20 - 275 mg/dL Final         Passed - Valid encounter within last 12 months    Recent Outpatient Visits  2 weeks ago COPD with acute exacerbation [facility])   [place] [facility] [place], Marsa PARAS, DO   2 months ago Weakness generalized   [place] [facility] [place], Marsa PARAS, DO   3 months ago Atrial fibrillation with [facility])   [place] [facility] Ed[place] Marsa PARAS, DO   3 months ago Weakness   [place] [facility] Everlene Parris LABOR, MD   4 months ago Annual physical exam   [facility] [place] Marsa PARAS, [place]

## 2024-01-23 NOTE — Telephone Encounter (Signed)
 Requested Prescriptions  Pending Prescriptions Disp Refills   cyanocobalamin  (VITAMIN B12) 1000 MCG/ML injection [Pharmacy Med Name: Cyanocobalamin  1000 MCG/ML Injection Solution] 3 mL 0    Sig: INJECT  1ML  INTRAMUSCULARLY ONCE EVERY 30 DAYS     Endocrinology:  Vitamins - Vitamin B12 Failed - 01/23/2024 11:17 AM      Failed - HGB in normal range and within 360 days    Hemoglobin  Date Value Ref Range Status  12/14/2023 11.8 (L) 12.0 - 15.0 g/dL Final   HGB  Date Value Ref Range Status  05/06/2014 13.2 12.0 - 16.0 g/dL Final         Passed - HCT in normal range and within 360 days    HCT  Date Value Ref Range Status  12/14/2023 36.6 36.0 - 46.0 % Final  05/06/2014 40.0 35.0 - 47.0 % Final         Passed - B12 Level in normal range and within 360 days    Vitamin B-12  Date Value Ref Range Status  11/11/2023 369 180 - 914 pg/mL Final    Comment:    (NOTE) This assay is not validated for testing neonatal or myeloproliferative syndrome specimens for Vitamin B12 levels. Performed at [facility], [address], [place], [place] [address]          Passed - Valid encounter within last 12 months    Recent Outpatient Visits           2 weeks ago COPD with acute exacerbation [facility])   [place] [facility] [place], Marsa PARAS, DO   2 months ago Weakness generalized   [place] [facility] [place], Marsa PARAS, DO   3 months ago Atrial fibrillation with [facility])   [place] [facility], Marsa PARAS, DO   3 months ago Weakness   [place] [facility], Parris LABOR, MD   4 months ago Annual physical exam   [place] [facility] [place], Marsa PARAS, DO               potassium chloride  SA (KLOR-CON  M20) 20 MEQ tablet [Pharmacy Med Name: Klor-Con  M20 20 MEQ Oral Tablet Extended Release] 90 tablet 2    Sig: Take 1 tablet by mouth once daily      Endocrinology:  Minerals - Potassium Supplementation Passed - 01/23/2024 11:17 AM      Passed - K in normal range and within 360 days    Potassium  Date Value Ref Range Status  12/14/2023 3.9 3.5 - 5.1 mmol/L Final  05/06/2014 3.5 3.5 - 5.1 mmol/L Final         Passed - Cr in normal range and within 360 days    Creat  Date Value Ref Range Status  08/27/2023 0.81 0.60 - 1.00 mg/dL Final   Creatinine, Ser  Date Value Ref Range Status  12/14/2023 0.81 0.44 - 1.00 mg/dL Final   Creatinine, Urine  Date Value Ref Range Status  08/27/2023 229 20 - 275 mg/dL Final         Passed - Valid encounter within last 12 months    Recent Outpatient Visits           2 weeks ago COPD with acute exacerbation [facility])   [place] [facility] [place] Marsa PARAS, DO   2 months ago Weakness generalized    [facility] [place],  Marsa PARAS, DO   3 months ago Atrial fibrillation with [facility])   [place] [facility] [facility] [place], Marsa PARAS, DO   3 months ago Weakness   [place] [facility] Everlene Parris LABOR, MD   4 months ago Annual physical exam   [facility] [place] PARAS, [place]

## 2024-01-24 NOTE — Telephone Encounter (Signed)
 Requested Prescriptions  Pending Prescriptions Disp Refills   losartan  (COZAAR ) 50 MG tablet [Pharmacy Med Name: Losartan  Potassium 50 MG Oral Tablet] 90 tablet 0    Sig: Take 1 tablet by mouth once daily     Cardiovascular:  Angiotensin Receptor Blockers Failed - 01/24/2024  1:02 PM      Failed - Last BP in normal range    BP Readings from Last 1 Encounters:  01/21/24 (!) 183/103         Passed - Cr in normal range and within 180 days    Creat  Date Value Ref Range Status  08/27/2023 0.81 0.60 - 1.00 mg/dL Final   Creatinine, Ser  Date Value Ref Range Status  12/14/2023 0.81 0.44 - 1.00 mg/dL Final   Creatinine, Urine  Date Value Ref Range Status  08/27/2023 229 20 - 275 mg/dL Final         Passed - K in normal range and within 180 days    Potassium  Date Value Ref Range Status  12/14/2023 3.9 3.5 - 5.1 mmol/L Final  05/06/2014 3.5 3.5 - 5.1 mmol/L Final         Passed - Patient is not pregnant      Passed - Valid encounter within last 6 months    Recent Outpatient Visits           3 weeks ago COPD with acute exacerbation [facility])   [place] [facility] [place] Marsa PARAS, DO   2 months ago Weakness generalized   [place] [facility] [place] Marsa PARAS, DO   3 months ago Atrial fibrillation with [facility])   [place] [facility] [place], Marsa PARAS, DO   3 months ago Weakness   [place] [facility] Everlene Parris LABOR, MD   4 months ago Annual physical exam   [facility] [place], Marsa PARAS, [place]

## 2024-01-27 ENCOUNTER — Other Ambulatory Visit: Payer: Self-pay

## 2024-01-27 NOTE — Patient Outreach (Signed)
 Contacted patient after one hour, states she is out to lunch.  Made plans to call back on Wednesday 10/29 due to she has a Pulmonary appt on 10/28.

## 2024-01-28 ENCOUNTER — Telehealth: Admitting: Family Medicine

## 2024-01-28 DIAGNOSIS — J441 Chronic obstructive pulmonary disease with (acute) exacerbation: Secondary | ICD-10-CM | POA: Diagnosis not present

## 2024-01-28 MED ORDER — DOXYCYCLINE HYCLATE 100 MG PO TABS: 100.0000 mg | ORAL_TABLET | Freq: Two times a day (BID) | ORAL | 0 refills | Status: AC

## 2024-01-28 NOTE — Patient Instructions (Addendum)
 [name], thank you for joining Chiquita CHRISTELLA Barefoot, NP for today's virtual visit.  While this provider is not your primary care provider (PCP), if your PCP is located in our provider database this encounter information will be shared with them immediately following your visit.   A [facility] MyChart account gives you access to today's visit and all your visits, tests, and labs performed at [facility]  click here if you don't have a [facility] MyChart account or go to [email or web address]  Consent: (Patient) Elizabeth [name] provided verbal consent for this virtual visit at the beginning of the encounter.  Current Medications:  Current Outpatient Medications:    doxycycline  (VIBRA -TABS) 100 MG tablet, Take 1 tablet (100 mg total) by mouth 2 (two) times daily for 7 days., Disp: 14 tablet, Rfl: 0   albuterol  (VENTOLIN  HFA) 108 (90 Base) MCG/ACT inhaler, Inhale 2 puffs into the lungs every 6 (six) hours as needed for wheezing or shortness of breath., Disp: 8 g, Rfl: 5   apixaban  (ELIQUIS ) 5 MG TABS tablet, Take 1 tablet (5 mg total) by mouth 2 (two) times daily., Disp: 60 tablet, Rfl: 0   ARIPiprazole  (ABILIFY ) 5 MG tablet, Take 1 tablet by mouth once daily, Disp: 90 tablet, Rfl: 0   busPIRone  (BUSPAR ) 5 MG tablet, Take 1 tablet (5 mg total) by mouth 3 (three) times daily., Disp: 90 tablet, Rfl: 0   cyanocobalamin  (VITAMIN B12) 1000 MCG/ML injection, INJECT  1ML  INTRAMUSCULARLY ONCE EVERY 30 DAYS, Disp: 3 mL, Rfl: 0   furosemide  (LASIX ) 40 MG tablet, Take 40 mg by mouth.  Take 1 tablet (40 mg total) by mouth once daily, Disp: , Rfl:    ipratropium (ATROVENT ) 0.06 % nasal spray, Place 2 sprays into both nostrils 4 (four) times daily., Disp: 15 mL, Rfl: 12   losartan  (COZAAR ) 50 MG tablet, Take 1 tablet by mouth once daily, Disp: 90 tablet, Rfl: 0   MAGNESIUM -OXIDE 400 (240 Mg) MG tablet, Take 1 tablet by mouth once daily, Disp: 90 tablet, Rfl: 0   metoprolol  succinate  (TOPROL -XL) 25 MG 24 hr tablet, Take 1 tablet (25 mg total) by mouth daily. Take with or immediately following a meal., Disp: 90 tablet, Rfl: 1   Multiple Vitamin (MULTI-VITAMINS) TABS, Take by mouth., Disp: , Rfl:    Nebulizers (PARI PRONEB MAX LC SPRINT) MISC, , Disp: , Rfl:    omeprazole  (PRILOSEC) 20 MG capsule, TAKE 1 CAPSULE BY MOUTH ONCE DAILY BEFORE BREAKFAST, Disp: 90 capsule, Rfl: 2   potassium chloride  SA (KLOR-CON  M20) 20 MEQ tablet, Take 1 tablet by mouth once daily, Disp: 90 tablet, Rfl: 2   promethazine-dextromethorphan (PROMETHAZINE-DM) 6.25-15 MG/5ML syrup, Take 5 mLs by mouth 4 (four) times daily as needed., Disp: 118 mL, Rfl: 0   rosuvastatin  (CRESTOR ) 40 MG tablet, Take 40 mg by mouth daily., Disp: , Rfl:    RYBELSUS  3 MG TABS, Take 1 tablet (3 mg total) by mouth daily before breakfast. 30 min before med or food. For 30 days, then dose will increase., Disp: 30 tablet, Rfl: 0   saccharomyces boulardii (FLORASTOR) 250 MG capsule, Take 1 capsule (250 mg total) by mouth 2 (two) times daily., Disp: 30 capsule, Rfl: 0   SPIRIVA  HANDIHALER 18 MCG inhalation capsule, Place 1 capsule (18 mcg total) into inhaler and inhale daily., Disp: 90 capsule, Rfl: 3   SYMBICORT 160-4.5 MCG/ACT inhaler, Inhale 2 puffs into the lungs., Disp: , Rfl:    venlafaxine  XR (EFFEXOR -XR) 75 MG  24 hr capsule, TAKE 3 CAPSULES BY MOUTH ONCE DAILY WITH BREAKFAST, Disp: 270 capsule, Rfl: 0   Medications ordered in this encounter:  Meds ordered this encounter  Medications   doxycycline  (VIBRA -TABS) 100 MG tablet    Sig: Take 1 tablet (100 mg total) by mouth 2 (two) times daily for 7 days.    Dispense:  14 tablet    Refill:  0    Supervising Provider:   BLAISE ALEENE KIDD [[id number]]     *If you need refills on other medications prior to your next appointment, please contact your pharmacy*  Follow-Up: Call back or seek an in-person evaluation if the symptoms worsen or if the condition fails to improve as  anticipated.  [facility] [phone]  Other Instructions  -advised strict in person follow up should symptoms fail to improve or worsen     If you have been instructed to have an in-person evaluation today at a local Urgent Care facility, please use the link below. It will take you to a list of all of our available [facility], including address, phone number and hours of operation. Please do not delay care.  [facility]  If you or a family member do not have a primary care provider, use the link below to schedule a visit and establish care. When you choose a [facility] primary care physician or advanced practice provider, you gain a long-term partner in health. Find a Primary Care Provider  Learn more about [facility]'s in-office and virtual care options: [facility] - Get Care Now

## 2024-01-28 NOTE — Progress Notes (Signed)
 Virtual Visit Consent   Elizabeth [name], you are scheduled for a virtual visit with a [facility] provider today. Just as with appointments in the office, your consent must be obtained to participate. Your consent will be active for this visit and any virtual visit you may have with one of our providers in the next 365 days. If you have a MyChart account, a copy of this consent can be sent to you electronically.  As this is a virtual visit, video technology does not allow for your provider to perform a traditional examination. This may limit your provider's ability to fully assess your condition. If your provider identifies any concerns that need to be evaluated in person or the need to arrange testing (such as labs, EKG, etc.), we will make arrangements to do so. Although advances in technology are sophisticated, we cannot ensure that it will always work on either your end or our end. If the connection with a video visit is poor, the visit may have to be switched to a telephone visit. With either a video or telephone visit, we are not always able to ensure that we have a secure connection.  By engaging in this virtual visit, you consent to the provision of healthcare and authorize for your insurance to be billed (if applicable) for the services provided during this visit. Depending on your insurance coverage, you may receive a charge related to this service.  I need to obtain your verbal consent now. Are you willing to proceed with your visit today? Elizabeth [name] has provided verbal consent on 01/28/2024 for a virtual visit (video or telephone). Chiquita CHRISTELLA Barefoot, NP  Date: 01/28/2024 1:35 PM   Virtual Visit via Video Note   I, Chiquita CHRISTELLA Barefoot, connected with  Elizabeth [name]  ([id number], [birth year]) on 01/28/24 at  1:30 PM EDT by a video-enabled telemedicine application and verified that I am speaking with the correct person using two identifiers.  Location: Patient: Virtual Visit Location Patient:  Home Provider: Virtual Visit Location Provider: Home Office   I discussed the limitations of evaluation and management by telemedicine and the availability of in person appointments. The patient expressed understanding and agreed to proceed.    History of Present Illness: Elizabeth [name] is a 73 y.o. who identifies as a female who was assigned female at birth, and is being seen today for  on going copd flare and inability to tolerate augmentin  that was ordered on 01/21/24.   Onset was 2 weeks with mucus- mostly clear. Has been having trouble with COPD since July when inpt due to resp failure. She has been treated several times in outpt setting. 8/22- Doxy and Pred, 10/3 Zpack, 10/21 Pred and Augmentin  (only able to take 2 days but reports vomiting them up) Associated symptoms are cough that is more than usually, and slight increased in mucus. Shortness of breath is not more than baseline at this time.  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) 07/04/2021   OSA on CPAP 06/26/2019   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 03/11/2018  Senile nuclear sclerosis, left 03/11/2018   Substance abuse (HCC)    Genetic testing    Pernicious anemia 11/27/2016   Normocytic anemia 10/16/2016   Iron  deficiency anemia 10/16/2016   Centrilobular emphysema (HCC)    Major depressive disorder, recurrent, moderate (HCC) 09/24/2016   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   Bilateral carpal tunnel syndrome 05/27/2015   Bilateral hand numbness 05/17/2015   Fracture of  humerus, proximal, closed 03/23/2013   Lipoma of skin and subcutaneous tissue 12/26/2011   B12 deficiency 02/15/2011   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   Dyspepsia and other specified disorders of function of stomach 10/11/2010   Benign essential hypertension 10/11/2010   Mixed hyperlipidemia 10/11/2010   Tobacco use disorder 10/11/2010   Anxiety state 08/25/2009   Shortness of breath 07/24/2006    Allergies:  Allergies  Allergen Reactions   Moxifloxacin Itching, Other (See Comments), Photosensitivity, Rash and Swelling    Causes swelling, redness, burning in eyes    Levofloxacin Other (See Comments)    Other reaction(s): Muscle Pain  Other reaction(s): Muscle Pain Other reaction(s): Muscle Pain   Amoxicillin -Pot Clavulanate Nausea Only and Other (See Comments)    Other Reaction: DIARRHEA.  Other reaction(s): Other (See Comments)  Other Reaction: DIARRHEA. Other reaction(s): Other (See Comments) Other Reaction: DIARRHEA. Other Reaction: DIARRHEA.  Other reaction(s): Other (See Comments) Other Reaction: DIARRHEA.    Other Reaction: DIARRHEA. Other Reaction: DIARRHEA.  Other reaction(s): Other (See Comments) Other Reaction: DIARRHEA.   Medications:  Current Outpatient Medications:    albuterol  (VENTOLIN  HFA) 108 (90 Base) MCG/ACT inhaler, Inhale 2 puffs into the lungs every 6 (six) hours as needed for wheezing or shortness of breath., Disp: 8 g, Rfl: 5   amoxicillin -clavulanate (AUGMENTIN ) 875-125 MG tablet, Take 1 tablet by mouth every 12 (twelve) hours for 7 days. (Patient not taking: Reported on 01/27/2024), Disp: 14 tablet, Rfl: 0   apixaban  (ELIQUIS ) 5 MG TABS tablet, Take 1 tablet (5 mg total) by mouth 2 (two) times daily., Disp: 60 tablet, Rfl: 0   ARIPiprazole  (ABILIFY ) 5 MG tablet, Take 1 tablet by mouth once daily, Disp: 90 tablet, Rfl: 0   azithromycin  (ZITHROMAX  Z-PAK) 250 MG tablet, Take 2 tabs (500mg  total) on Day 1. Take 1 tab (250mg ) daily for next 4  days., Disp: 6 tablet, Rfl: 0   busPIRone  (BUSPAR ) 5 MG tablet, Take 1 tablet (5 mg total) by mouth 3 (three) times daily., Disp: 90 tablet, Rfl: 0   cyanocobalamin  (VITAMIN B12) 1000 MCG/ML injection, INJECT  1ML  INTRAMUSCULARLY ONCE EVERY 30 DAYS, Disp: 3 mL, Rfl: 0   furosemide  (LASIX ) 40 MG tablet, Take 40 mg by mouth.  Take 1 tablet (40 mg total) by mouth once daily, Disp: , Rfl:    ipratropium (ATROVENT ) 0.06 % nasal spray, Place 2 sprays into both nostrils 4 (four) times daily., Disp: 15 mL, Rfl: 12   losartan  (COZAAR ) 50 MG tablet, Take 1 tablet by mouth once daily, Disp: 90 tablet, Rfl: 0   MAGNESIUM -OXIDE 400 (240 Mg) MG tablet, Take 1 tablet by mouth once daily, Disp: 90 tablet, Rfl: 0   metoprolol  succinate (TOPROL -XL) 25 MG 24 hr tablet, Take 1 tablet (25 mg total) by mouth daily. Take with or immediately following a meal., Disp: 90 tablet, Rfl: 1   Multiple Vitamin (MULTI-VITAMINS) TABS, Take by mouth., Disp: , Rfl:    Nebulizers (PARI PRONEB MAX LC SPRINT) MISC, , Disp: , Rfl:    omeprazole  (PRILOSEC) 20 MG capsule, TAKE 1  CAPSULE BY MOUTH ONCE DAILY BEFORE BREAKFAST, Disp: 90 capsule, Rfl: 2   potassium chloride  SA (KLOR-CON  M20) 20 MEQ tablet, Take 1 tablet by mouth once daily, Disp: 90 tablet, Rfl: 2   promethazine-dextromethorphan (PROMETHAZINE-DM) 6.25-15 MG/5ML syrup, Take 5 mLs by mouth 4 (four) times daily as needed., Disp: 118 mL, Rfl: 0   rosuvastatin  (CRESTOR ) 40 MG tablet, Take 40 mg by mouth daily., Disp: , Rfl:    RYBELSUS  3 MG TABS, Take 1 tablet (3 mg total) by mouth daily before breakfast. 30 min before med or food. For 30 days, then dose will increase., Disp: 30 tablet, Rfl: 0   saccharomyces boulardii (FLORASTOR) 250 MG capsule, Take 1 capsule (250 mg total) by mouth 2 (two) times daily., Disp: 30 capsule, Rfl: 0   SPIRIVA  HANDIHALER 18 MCG inhalation capsule, Place 1 capsule (18 mcg total) into inhaler and inhale daily., Disp: 90 capsule, Rfl: 3   SYMBICORT  160-4.5 MCG/ACT inhaler, Inhale 2 puffs into the lungs., Disp: , Rfl:    venlafaxine  XR (EFFEXOR -XR) 75 MG 24 hr capsule, TAKE 3 CAPSULES BY MOUTH ONCE DAILY WITH BREAKFAST, Disp: 270 capsule, Rfl: 0  Observations/Objective: Patient is well-developed, well-nourished in no acute distress.  Resting comfortably  at home.  Head is normocephalic, atraumatic.  No labored breathing.  Speech is clear and coherent with logical content.  Patient is alert and oriented at baseline.     Assessment and Plan:  1. COPD with acute exacerbation (HCC) (Primary)  - doxycycline  (VIBRA -TABS) 100 MG tablet; Take 1 tablet (100 mg total) by mouth 2 (two) times daily for 7 days.  Dispense: 14 tablet; Refill: 0   -given recent prednisone  use we will not order that again -will repeat doxy- as discussed in detail the risk of this so soon and the over use of ABX in general -advised strict in person follow up should symptoms fail to improve or worsen   - anxiety and allergies along with the stopping of smoking might be causing the symptoms as well   Reviewed side effects, risks and benefits of medication.    Patient acknowledged agreement and understanding of the plan.   Past Medical, Surgical, Social History, Allergies, and Medications have been Reviewed.  .Follow Up Instructions: I discussed the assessment and treatment plan with the patient. The patient was provided an opportunity to ask questions and all were answered. The patient agreed with the plan and demonstrated an understanding of the instructions.  A copy of instructions were sent to the patient via MyChart unless otherwise noted below.    The patient was advised to call back or seek an in-person evaluation if the symptoms worsen or if the condition fails to improve as anticipated.    Chiquita CHRISTELLA Barefoot, NP

## 2024-01-29 ENCOUNTER — Other Ambulatory Visit: Payer: Self-pay

## 2024-01-29 NOTE — Patient Outreach (Signed)
 Complex Care Management   Visit Note  01/29/2024  Name:  Elizabeth [name] MRN: [id number] DOB: October 21, 1950  Situation: Referral received for Complex Care Management related to COPD I obtained verbal consent from Patient.  Visit completed with Elizabeth [name]  on the phone. Patient has had multiple COPD exacerbations in the last six months with one hospitalization, one ED visit, and three Urgent Care visits.   Background:   Past Medical History:  Diagnosis Date   Allergy    Anxiety    B12 deficiency    Carpal tunnel syndrome    Centrilobular emphysema (HCC)    COPD (chronic obstructive pulmonary disease) (HCC)    Depression    Genetic testing    Common Cancers panel (47 genes) @ [facility] - No pathogenic mutations detected   HLD (hyperlipidemia)    Hyperlipidemia    Hypertension    Iron  deficiency anemia 10/16/2016   Lipoma of skin    Lung nodule    Normocytic anemia 10/16/2016   QT prolongation    Substance abuse (HCC)     Assessment: Patient Reported Symptoms:  Cognitive Cognitive Status: Alert and oriented to person, place, and time, Insightful and able to interpret abstract concepts, Normal speech and language skills Cognitive/Intellectual Conditions Management [RPT]: None reported or documented in medical history or problem list      Neurological Neurological Review of Symptoms: No symptoms reported    HEENT HEENT Symptoms Reported: Other: HEENT Comment: 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.  Wears dentures, has a place to go if needed (daughter knows the name of the place).    Cardiovascular Cardiovascular Symptoms Reported: No symptoms reported    Respiratory Respiratory Symptoms Reported: Shortness of breath Additional Respiratory Details: Shortness of breath at baseline, had video visit with Pulmonary yesterday, prescribed doxycycline  BID. Self  administers breathing treatment as  ordered, takes albuterol , Spiriva , Symbicort as ordered.  Uses CPAP at night with oxygen, CPAP is in good working order, she has DME company contact information for any machine parts replacement/repairs. Respiratory Management Strategies: CPAP, Medication therapy, Routine screening, Breathing techniques Respiratory Self-Management Outcome: 4 (good)  Endocrine Endocrine Symptoms Reported: No symptoms reported Is patient diabetic?: Yes Is patient checking blood sugars at home?: Yes Endocrine Self-Management Outcome: 4 (good) Endocrine Comment: A1C 6.7% on 10/18/23. Takes Rybelsus  as ordered.  Gastrointestinal Gastrointestinal Symptoms Reported: No symptoms reported      Genitourinary Genitourinary Symptoms Reported: No symptoms reported    Integumentary Integumentary Symptoms Reported: No symptoms reported Additional Integumentary Details: Washes feet daily    Musculoskeletal   Musculoskeletal Comment: She tripped over her cat and fell, no injuries, about 5-6 weeks ago. Falls in the past year?: Yes Number of falls in past year: 1 or less Was there an injury with Fall?: No Fall Risk Category Calculator: 1 Patient Fall Risk Level: Low Fall Risk    Psychosocial Psychosocial Symptoms Reported: No symptoms reported   Behaviors When Feeling Stressed/Fearful: She goes out to eat with her daughter at a restaurant in [place] where they sit outside. , she likes to get outside Quality of Family Relationships: helpful, involved, supportive    01/29/2024    PHQ2-9 Depression Screening   Little interest or pleasure in doing things Not at all  Feeling down, depressed, or hopeless Not at all  PHQ-2 - Total Score 0  Trouble falling or staying asleep, or sleeping too much  Feeling tired or having little energy    Poor appetite or overeating     Feeling bad about yourself - or that you are a failure or have let yourself or your family down    Trouble concentrating on things, such as reading the  newspaper or watching television    Moving or speaking so slowly that other people could have noticed.  Or the opposite - being so fidgety or restless that you have been moving around a lot more than usual    Thoughts that you would be better off dead, or hurting yourself in some way    PHQ2-9 Total Score    If you checked off any problems, how difficult have these problems made it for you to do your work, take care of things at home, or get along with other people    Depression Interventions/Treatment      There were no vitals filed for this visit.  Medications Reviewed Today     Reviewed by Lucian Santana LABOR, RN (Registered Nurse) on 01/29/24 at 1118  Med List Status: <None>   Medication Order Taking? Sig Documenting Provider Last Dose Status Informant  albuterol  (VENTOLIN  HFA) 108 (90 Base) MCG/ACT inhaler [id number] Yes Inhale 2 puffs into the lungs every 6 (six) hours as needed for wheezing or shortness of breath. Edman Marsa PARAS, DO  Active Child  apixaban  (ELIQUIS ) 5 MG TABS tablet [id number] Yes Take 1 tablet (5 mg total) by mouth 2 (two) times daily.  Patient taking differently: Take 5 mg by mouth 2 (two) times daily. Takes once a day due to frequent nose bleeds   Vivienne Delon HERO, PA-C  Active   ARIPiprazole  (ABILIFY ) 5 MG tablet [id number] Yes Take 1 tablet by mouth once daily Edman Marsa PARAS, DO  Active   busPIRone  (BUSPAR ) 5 MG tablet [id number] Yes Take 1 tablet (5 mg total) by mouth 3 (three) times daily. Vivienne Delon M, PA-C  Active   cyanocobalamin  (VITAMIN B12) 1000 MCG/ML injection [id number] Yes INJECT  1ML  INTRAMUSCULARLY ONCE EVERY 30 DAYS Karamalegos, Alexander PARAS, DO  Active   doxycycline  (VIBRA -TABS) 100 MG tablet [id number] Yes Take 1 tablet (100 mg total) by mouth 2 (two) times daily for 7 days. Moishe Chiquita HERO, NP  Active   furosemide  (LASIX ) 40 MG tablet [id number] Yes Take 40 mg by mouth.  Take 1 tablet (40 mg total) by mouth once daily   Patient taking differently: Take 40 mg by mouth.  Take 1/2 tablet (40 mg total) by mouth once daily   [provider]  Active Child  ipratropium (ATROVENT ) 0.06 % nasal spray [id number] Yes Place 2 sprays into both nostrils 4 (four) times daily. Bernardino Ditch, NP  Active   losartan  (COZAAR ) 50 MG tablet [id number] Yes Take 1 tablet by mouth once daily Edman Marsa PARAS, DO  Active   MAGNESIUM -OXIDE 400 (240 Mg) MG tablet [id number] Yes Take 1 tablet by mouth once daily Edman Marsa PARAS, DO  Active Child  metoprolol  succinate (TOPROL -XL) 25 MG 24 hr tablet [id number] Yes Take 1 tablet (25 mg total) by mouth daily. Take with or immediately following a meal. Edman, Marsa PARAS, DO  Active Child  Multiple Vitamin (MULTI-VITAMINS) TABS [id number] Yes Take by mouth. [provider]  Active Child  Nebulizers (PARI PRONEB MAX [facility] SPRINT) MISC [id number] Yes  [provider]  Active Child  omeprazole  (PRILOSEC) 20 MG capsule [id number] Yes TAKE 1 CAPSULE BY MOUTH ONCE DAILY BEFORE BREAKFAST Edman Marsa  J, DO  Active   potassium chloride  SA (KLOR-CON  M20) 20 MEQ tablet [id number] Yes Take 1 tablet by mouth once daily Karamalegos, Marsa PARAS, DO  Active   promethazine-dextromethorphan (PROMETHAZINE-DM) 6.25-15 MG/5ML syrup [id number]  Take 5 mLs by mouth 4 (four) times daily as needed.  Patient not taking: Reported on 01/29/2024   Arvis Jolan KATHEE DEVONNA  Active   rosuvastatin  (CRESTOR ) 40 MG tablet [id number] Yes Take 40 mg by mouth daily. [provider]  Active Child  RYBELSUS  3 MG TABS [id number] Yes Take 1 tablet (3 mg total) by mouth daily before breakfast. 30 min before med or food. For 30 days, then dose will increase. Karamalegos, Marsa PARAS, DO  Active   saccharomyces boulardii (FLORASTOR) 250 MG capsule [id number]  Take 1 capsule (250 mg total) by mouth 2 (two) times daily.  Patient not taking: Reported on 01/29/2024   Burnette, Jennifer M,  PA-C  Active   SPIRIVA  HANDIHALER 18 MCG inhalation capsule [id number] Yes Place 1 capsule (18 mcg total) into inhaler and inhale daily. Edman Marsa PARAS, DO  Active Child  SYMBICORT 160-4.5 MCG/ACT inhaler [id number] Yes Inhale 2 puffs into the lungs. [provider]  Active Child  venlafaxine  XR (EFFEXOR -XR) 75 MG 24 hr capsule [id number] Yes TAKE 3 CAPSULES BY MOUTH ONCE DAILY WITH BREAKFAST Karamalegos, Alexander J, DO  Active             Recommendation:   Patient will complete course of doxycycline  as ordered by Pulmonary on 01/28/24 Specialty provider follow-up : Pulmonary 02/10/24  Follow Up Plan:   Telephone follow-up two weeks  Santana Stamp BSN, CCM [place]  [facility] Population Health RN Care Manager Direct Dial: [phone]  Fax: [phone]

## 2024-01-29 NOTE — Patient Instructions (Signed)
 Visit Information  Thank you for taking time to visit with me today. Please don't hesitate to contact me if I can be of assistance to you before our next scheduled appointment.  Our next appointment is by telephone on Wednesday, November 12th at 2:00pm. Please call the care guide team at [phone] if you need to cancel or reschedule your appointment.   Following is a copy of your care plan:   Goals Addressed             This Visit's Progress    VBCI RN Care Plan       Problems:  Chronic Disease Management support and education needs related to COPD  Goal: Over the next 3 months the Patient will demonstrate a decrease COPD in exacerbations as evidenced by a decrease in hospitalizations/ED/UC visits related to COPD exacerbations (in last six months, hosp visits=1; ED visits=1; UC visits=3)   Over the next 3 weeks, patient will attend appointment with Pulmonology on 02/10/24 Over the next 7 days, patient will complete doxycycline  course as prescribed by Pulmonary on 01/28/24  Interventions:   COPD Interventions: Provided education about and advised patient to utilize infection prevention strategies to reduce risk of respiratory infection Provided instruction about proper use of medications used for management of COPD including inhalers Reviewed and discussed medications   Patient Self-Care Activities:  Attend all scheduled provider appointments Call provider office for new concerns or questions  Take medications as prescribed   do breathing exercises every day  Plan:  Telephone follow up appointment with care management team member scheduled for:  two weeks             A reminder to ALL patients/family/friends, please call the [place]  National Suicide Prevention Lifeline: 367-787-1143 or TTY: (315)206-8370 TTY 2043922304) to talk to a trained counselor if you are experiencing a Mental Health or Behavioral Health Crisis or need someone to talk to.  Patient  verbalized understanding of Care plan and visit instructions communicated this visit  Santana Stamp BSN, CCM [place]  [facility] Population Health RN Care Manager Direct Dial: [phone]  Fax: [phone]

## 2024-01-31 ENCOUNTER — Encounter: Payer: Self-pay | Admitting: Oncology

## 2024-02-10 ENCOUNTER — Telehealth: Payer: Self-pay

## 2024-02-10 ENCOUNTER — Other Ambulatory Visit: Payer: Self-pay | Admitting: Family Medicine

## 2024-02-10 ENCOUNTER — Ambulatory Visit: Admitting: Pulmonary Disease

## 2024-02-10 ENCOUNTER — Encounter: Payer: Self-pay | Admitting: Pulmonary Disease

## 2024-02-10 VITALS — BP 138/78 | HR 56 | Temp 98.7°F | Ht 62.0 in | Wt 192.0 lb

## 2024-02-10 DIAGNOSIS — J449 Chronic obstructive pulmonary disease, unspecified: Secondary | ICD-10-CM | POA: Diagnosis not present

## 2024-02-10 DIAGNOSIS — G4733 Obstructive sleep apnea (adult) (pediatric): Secondary | ICD-10-CM | POA: Diagnosis not present

## 2024-02-10 DIAGNOSIS — J4489 Other specified chronic obstructive pulmonary disease: Secondary | ICD-10-CM

## 2024-02-10 DIAGNOSIS — F17211 Nicotine dependence, cigarettes, in remission: Secondary | ICD-10-CM

## 2024-02-10 DIAGNOSIS — Z85118 Personal history of other malignant neoplasm of bronchus and lung: Secondary | ICD-10-CM

## 2024-02-10 DIAGNOSIS — F331 Major depressive disorder, recurrent, moderate: Secondary | ICD-10-CM

## 2024-02-10 NOTE — Telephone Encounter (Signed)
 Patient was seen in the office today. Dr. Larinda Buttery has ordered [facility] for the patient. She has signed the form and it has been faxed to the pharmacy team for completion.

## 2024-02-10 NOTE — Progress Notes (Unsigned)
 Synopsis: Referred in by Edman Blunt *   Subjective:   PATIENT ID: Elizabeth [name] GENDER: female DOB: 10-24-1950, MRN: [id number]  Chief Complaint  Patient presents with   COPD    DOE. No wheezing or cough. Hospitalized in July for pneumonia.  Symbicort- BID helps with her breathing. Albuterol  Inhaler-  2-4 times daily. Spiriva - at night. Albuterol  Neb- BID    HPI Discussed the use of AI scribe software for clinical note transcription with the patient, who gave verbal consent to proceed.  History of Present Illness   Elizabeth [name] is a 73 year old female with COPD and sleep apnea who presents with worsening breathing difficulties. She is accompanied by her daughter, who is her primary caregiver.  She experiences significant dyspnea, unable to walk to the front door without becoming breathless. She uses oxygen at night and a CPAP machine for her sleep apnea. Despite performing well on a walking test in [facility], she feels she needs continuous oxygen. She is frustrated with traveling to [facility] for care and wishes to switch to a closer facility to ease the burden on her daughter, who drives her.  She has a history of COPD and is currently on Symbicort, Spiriva , and albuterol  as needed. She quit smoking in July after a respiratory arrest incident, having smoked a pack to a pack and a half daily since age 13. She experienced respiratory arrest on July 18th and was hospitalized for five days. Since then, she has not smoked. She mentions memory problems following the hospitalization, which she attributes to the respiratory issues.  She has a history of lung cancer, treated with radiation therapy. Her recent CT scan was noted to be good, and she is scheduled to see Dr. Jacobo in December for follow-up. She recalls a previous breathing test in 2021 where she almost passed out, leading to a cessation of further testing at that time.  She is uncertain about the  specifics of her vaccinations received in April.        Family History  Problem Relation Age of Onset   Ovarian cancer Mother 32       deceased 57   Breast cancer Mother 25   Stroke Father    Lung cancer Paternal Aunt    Lung cancer Cousin      Social History   Socioeconomic History   Marital status: Divorced    Spouse name: Not on file   Number of children: 1   Years of education: Not on file   Highest education level: Associate degree: occupational, scientist, product/process development, or vocational program  Occupational History   Occupation: Retired  Tobacco Use   Smoking status: Former    Current packs/day: 0.00    Average packs/day: 0.5 packs/day for 51.9 years (25.9 ttl pk-yrs)    Types: Cigarettes    Start date: 12/02/1971    Quit date: 10/18/2023    Years since quitting: 0.3   Smokeless tobacco: Never   Tobacco comments:    Quit smoking 10/18/2023        Started smoking at 73 years old.    Smoked 1.5PPD at her heaviest  Vaping Use   Vaping status: Some Days  Substance and Sexual Activity   Alcohol use: No   Drug use: No   Sexual activity: Not Currently  Other Topics Concern   Not on file  Social History Narrative   Not on file   Social Drivers of Health   Financial Resource Strain: Low Risk  (  10/11/2023)   Overall Financial Resource Strain (CARDIA)    Difficulty of Paying Living Expenses: Not hard at all  Food Insecurity: No Food Insecurity (12/06/2023)   Hunger Vital Sign    Worried About Running Out of Food in the Last Year: Never true    Ran Out of Food in the Last Year: Never true  Transportation Needs: No Transportation Needs (12/06/2023)   PRAPARE - Administrator, Civil Service (Medical): No    Lack of Transportation (Non-Medical): No  Physical Activity: Inactive (10/11/2023)   Exercise Vital Sign    Days of Exercise per Week: 0 days    Minutes of Exercise per Session: 0 min  Stress: No Stress Concern Present (10/11/2023)   [employer] of Occupational  Health - Occupational Stress Questionnaire    Feeling of Stress: Not at all  Social Connections: Socially Isolated (12/06/2023)   Social Connection and Isolation Panel    Frequency of Communication with Friends and Family: More than three times a week    Frequency of Social Gatherings with Friends and Family: More than three times a week    Attends Religious Services: Never    Database Administrator or Organizations: No    Attends Banker Meetings: Never    Marital Status: Divorced  Catering Manager Violence: Not At Risk (12/06/2023)   Humiliation, Afraid, Rape, and Kick questionnaire    Fear of Current or Ex-Partner: No    Emotionally Abused: No    Physically Abused: No    Sexually Abused: No        Objective:   Vitals:   02/10/24 1519  BP: 138/78  Pulse: (!) 56  Temp: 98.7 F (37.1 C)  SpO2: 95%  Weight: 192 lb (87.1 kg)  Height: 5' 2 (1.575 m)   95% on RA BMI Readings from Last 3 Encounters:  02/10/24 35.12 kg/m  01/21/24 35.12 kg/m  01/03/24 35.12 kg/m   Wt Readings from Last 3 Encounters:  02/10/24 192 lb (87.1 kg)  01/21/24 192 lb (87.1 kg)  01/03/24 192 lb (87.1 kg)    Physical Exam GEN: NAD HEENT: Supple Neck, Reactive Pupils, EOMI  CVS: Normal S1, Normal S2, RRR, No murmurs or ES appreciated  Lungs: Poor air movement.  Abdomen: Soft, non tender, non distended, + BS  Extremities: Warm and well perfused, No edema  Skin: No suspicious lesions appreciated  Psych: Normal Affect  Ancillary Information   CBC    Component Value Date/Time   WBC 7.7 12/14/2023 1031   RBC 4.36 12/14/2023 1031   HGB 11.8 (L) 12/14/2023 1031   HGB 13.2 05/06/2014 1028   HCT 36.6 12/14/2023 1031   HCT 40.0 05/06/2014 1028   PLT 243 12/14/2023 1031   PLT 331 05/06/2014 1028   MCV 83.9 12/14/2023 1031   MCV 84 05/06/2014 1028   MCH 27.1 12/14/2023 1031   MCHC 32.2 12/14/2023 1031   RDW 16.0 (H) 12/14/2023 1031   RDW 13.4 05/06/2014 1028   LYMPHSABS 2.1  12/14/2023 1031   MONOABS 0.6 12/14/2023 1031   EOSABS 0.0 12/14/2023 1031   BASOSABS 0.0 12/14/2023 1031    Labs and imaging were reviewed.      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 Current therapy includes Symbicort, Spiriva , and albuterol . Recent respiratory  arrest prompted smoking cessation. Pulmonary function suggests early stage 4 or late stage 3. Focus on quality of life improvement. - Ordered repeat pulmonary function test. - Initiated Ohtuvayre  nebulizer twice daily. - C/w Symbicort and Spiriva  as prescribed.  - Referred to pulmonary rehabilitation. - Monitored oxygen saturation during walking test. - Scheduled follow-up in 3 months.  Obstructive sleep apnea Managed with CPAP and nocturnal oxygen therapy.  History of lung cancer, status post radiation Lung cancer treated with radiation. Recent CT scan showed no recurrence. - Follow-up with oncologist scheduled for December.  History of tobacco use Smoking since age 6, quit in July  Cessation expected to slow COPD progression.    Return in about 3 months (around 05/12/2024).  I personally spent a total of 60 minutes in the care of the patient today including preparing to see the patient, getting/reviewing separately obtained history, performing a medically appropriate exam/evaluation, counseling and educating, placing orders, documenting clinical information in the EHR, independently interpreting results, and communicating results.   Darrin Barn, MD [place] [facility] 02/10/2024 4:48 PM

## 2024-02-11 ENCOUNTER — Other Ambulatory Visit: Payer: Self-pay | Admitting: Family Medicine

## 2024-02-11 ENCOUNTER — Other Ambulatory Visit (HOSPITAL_COMMUNITY): Payer: Self-pay

## 2024-02-11 DIAGNOSIS — D509 Iron deficiency anemia, unspecified: Secondary | ICD-10-CM

## 2024-02-11 DIAGNOSIS — J432 Centrilobular emphysema: Secondary | ICD-10-CM

## 2024-02-11 NOTE — Telephone Encounter (Signed)
 Noted. Nothing further needed.

## 2024-02-11 NOTE — Telephone Encounter (Signed)
 Received new start paperwork for [facility]. Will initiate benefits investigation in new encounter.

## 2024-02-12 ENCOUNTER — Other Ambulatory Visit: Payer: Self-pay

## 2024-02-12 NOTE — Patient Outreach (Signed)
 Complex Care Management   Visit Note  02/12/2024  Name:  Elizabeth [name] MRN: [id number] DOB: 1950-04-14  Situation: Referral received for Complex Care Management related to COPD I obtained verbal consent from Patient.  Visit completed with Elizabeth [name]  on the phone.  Background:   Past Medical History:  Diagnosis Date   Allergy    Anxiety    B12 deficiency    Carpal tunnel syndrome    Centrilobular emphysema (HCC)    COPD (chronic obstructive pulmonary disease) (HCC)    Depression    Genetic testing    Common Cancers panel (47 genes) @ [facility] - No pathogenic mutations detected   HLD (hyperlipidemia)    Hyperlipidemia    Hypertension    Iron  deficiency anemia 10/16/2016   Lipoma of skin    Lung nodule    Normocytic anemia 10/16/2016   QT prolongation    Substance abuse (HCC)     Assessment: Patient Reported Symptoms:  Cognitive Cognitive Status: Alert and oriented to person, place, and time, Insightful and able to interpret abstract concepts, Normal speech and language skills Cognitive/Intellectual Conditions Management [RPT]: None reported or documented in medical history or problem list      Neurological Neurological Review of Symptoms: No symptoms reported    HEENT HEENT Symptoms Reported: Not assessed      Cardiovascular Cardiovascular Symptoms Reported: No symptoms reported    Respiratory Additional Respiratory Details: Shortness of breath at baseline, finished doxycyclind as prescribed, new patient appt completed wit Elizabeth [name] (needed more local Pulm.), he is ordering Ohtuvayre , their office is assisting with getting the med approved, patient understands it will be used in a nebulizer.  She will continue Spiriva , Symbicort, and albuterol  as ordered along with Ohtuvayre  per Pulm orders.  Continues to use CPAP at night with oxygen.  Pulmonary testing did not indicate she wear continuous O2 nor with ambulation. Respiratory Management Strategies: Routine  screening, Breathing exercise, CPAP, Medication therapy Respiratory Self-Management Outcome: 4 (good)  Endocrine Endocrine Symptoms Reported: Not assessed    Gastrointestinal Gastrointestinal Symptoms Reported: No symptoms reported      Genitourinary Genitourinary Symptoms Reported: No symptoms reported    Integumentary Integumentary Symptoms Reported: Not assessed    Musculoskeletal Musculoskelatal Symptoms Reviewed: Not assessed        Psychosocial Psychosocial Symptoms Reported: Not assessed          02/12/2024    PHQ2-9 Depression Screening   Little interest or pleasure in doing things    Feeling down, depressed, or hopeless    PHQ-2 - Total Score    Trouble falling or staying asleep, or sleeping too much    Feeling tired or having little energy    Poor appetite or overeating     Feeling bad about yourself - or that you are a failure or have let yourself or your family down    Trouble concentrating on things, such as reading the newspaper or watching television    Moving or speaking so slowly that other people could have noticed.  Or the opposite - being so fidgety or restless that you have been moving around a lot more than usual    Thoughts that you would be better off dead, or hurting yourself in some way    PHQ2-9 Total Score    If you checked off any problems, how difficult have these problems made it for you to do your work, take care of things at home, or get along with other people  Depression Interventions/Treatment      There were no vitals filed for this visit.    MEDICATIONS: Pulmonary prescribed Ohtuvayre , his office in the process of getting insurance approval. No other med concerns, no issues with refills.    Recommendation:   Continue Current Plan of Care Next f/u with Pulmonary in 3 mos to assess newly prescribed Ohtuvayre .   Follow Up Plan:   Telephone follow-up two weeks.  Santana Stamp BSN, CCM [place]  VBCI Population Health RN Care  Manager Direct Dial: [phone]  Fax: [phone]

## 2024-02-12 NOTE — Patient Instructions (Signed)
 Visit Information  Thank you for taking time to visit with me today. Please don't hesitate to contact me if I can be of assistance to you before our next scheduled appointment.  Your next care management appointment is by telephone on Wednesday, November 26th at 2:00pm   Please call the care guide team at [phone] if you need to cancel, schedule, or reschedule an appointment.   Please call the [place]  National Suicide Prevention Lifeline: (760)619-5808 or TTY: [phone] TTY [phone]) to talk to a trained counselor if you are experiencing a Mental Health or Behavioral Health Crisis or need someone to talk to.  Santana Stamp BSN, CCM [place]  VBCI Population Health RN Care Manager Direct Dial: [phone]  Fax: [phone]

## 2024-02-12 NOTE — Telephone Encounter (Signed)
 Requested medication (s) are due for refill today: Yes  Requested medication (s) are on the active medication list: Yes  Last refill:  11/15/23  Future visit scheduled: No  Notes to clinic:  Unable to refill per protocol, cannot delegate.      Requested Prescriptions  Pending Prescriptions Disp Refills   ARIPiprazole  (ABILIFY ) 5 MG tablet [Pharmacy Med Name: ARIPiprazole  5 MG Oral Tablet] 90 tablet 0    Sig: Take 1 tablet by mouth once daily     Not Delegated - Psychiatry:  Antipsychotics - Second Generation (Atypical) - aripiprazole  Failed - 02/12/2024  5:12 PM      Failed - This refill cannot be delegated      Failed - Lipid Panel in normal range within the last 12 months    Cholesterol  Date Value Ref Range Status  08/27/2023 190 <200 mg/dL Final   LDL Cholesterol (Calc)  Date Value Ref Range Status  08/27/2023 104 (H) mg/dL (calc) Final    Comment:    Reference range: <100 . Desirable range <100 mg/dL for primary prevention;   <70 mg/dL for patients with CHD or diabetic patients  with > or = 2 CHD risk factors. SABRA LDL-C is now calculated using the Martin-Hopkins  calculation, which is a validated novel method providing  better accuracy than the Friedewald equation in the  estimation of LDL-C.  Elizabeth [name] et al. SANDREA. 7986;689(80): 2061-2068  (http://education.[facility].com/faq/FAQ164)    HDL  Date Value Ref Range Status  08/27/2023 62 > OR = 50 mg/dL Final   Triglycerides  Date Value Ref Range Status  08/27/2023 137 <150 mg/dL Final         Passed - TSH in normal range and within 360 days    TSH  Date Value Ref Range Status  11/11/2023 3.880 0.450 - 4.500 uIU/mL Final         Passed - Completed PHQ-2 or PHQ-9 in the last 360 days      Passed - Last BP in normal range    BP Readings from Last 1 Encounters:  02/10/24 138/78         Passed - Last Heart Rate in normal range    Pulse Readings from Last 1 Encounters:  02/10/24 (!) 56          Passed - Valid encounter within last 6 months    Recent Outpatient Visits           1 month ago COPD with acute exacerbation [facility])   [place] [facility] [place], Marsa PARAS, DO   3 months ago Weakness generalized   [facility] [place], Marsa PARAS, DO   4 months ago Atrial fibrillation with RVR [facility])   [facility], Marsa PARAS, DO   4 months ago Weakness   [facility] Everlene Parris LABOR, MD   5 months ago Annual physical exam    [facility] [place], Marsa PARAS, DO              Passed - CBC within normal limits and completed in the last 12 months    WBC  Date Value Ref Range Status  12/14/2023 7.7 4.0 - 10.5 K/uL Final   RBC  Date Value Ref Range Status  12/14/2023 4.36 3.87 - 5.11 MIL/uL Final   Hemoglobin  Date Value Ref Range Status  12/14/2023 11.8 (L) 12.0 - 15.0 g/dL Final  HGB  Date Value Ref Range Status  05/06/2014 13.2 12.0 - 16.0 g/dL Final   HCT  Date Value Ref Range Status  12/14/2023 36.6 36.0 - 46.0 % Final  05/06/2014 40.0 35.0 - 47.0 % Final   MCHC  Date Value Ref Range Status  12/14/2023 32.2 30.0 - 36.0 g/dL Final   [facility]  Date Value Ref Range Status  12/14/2023 27.1 26.0 - 34.0 pg Final   MCV  Date Value Ref Range Status  12/14/2023 83.9 80.0 - 100.0 fL Final  05/06/2014 84 80 - 100 fL Final   No results found for: PLTCOUNTKUC, LABPLAT, POCPLA RDW  Date Value Ref Range Status  12/14/2023 16.0 (H) 11.5 - 15.5 % Final  05/06/2014 13.4 11.5 - 14.5 % Final         Passed - CMP within normal limits and completed in the last 12 months    Albumin  Date Value Ref Range Status  12/14/2023 3.9 3.5 - 5.0 g/dL Final   Alkaline Phosphatase  Date Value Ref Range Status  12/14/2023 96 38 - 126 U/L Final   Alkaline phosphatase (APISO)  Date Value Ref Range Status  08/27/2023 104  37 - 153 U/L Final   ALT  Date Value Ref Range Status  12/14/2023 17 0 - 44 U/L Final   AST  Date Value Ref Range Status  12/14/2023 25 15 - 41 U/L Final   BUN  Date Value Ref Range Status  12/14/2023 11 8 - 23 mg/dL Final  97/95/7983 6 (L) 7 - 18 mg/dL Final   Calcium   Date Value Ref Range Status  12/14/2023 8.8 (L) 8.9 - 10.3 mg/dL Final   Calcium , Total  Date Value Ref Range Status  05/06/2014 8.8 8.5 - 10.1 mg/dL Final   CO2  Date Value Ref Range Status  12/14/2023 30 22 - 32 mmol/L Final   Co2  Date Value Ref Range Status  05/06/2014 27 21 - 32 mmol/L Final   Bicarbonate  Date Value Ref Range Status  10/19/2023 20.1 20.0 - 28.0 mmol/L Final   Creat  Date Value Ref Range Status  08/27/2023 0.81 0.60 - 1.00 mg/dL Final   Creatinine, Ser  Date Value Ref Range Status  12/14/2023 0.81 0.44 - 1.00 mg/dL Final   Creatinine, Urine  Date Value Ref Range Status  08/27/2023 229 20 - 275 mg/dL Final   Glucose  Date Value Ref Range Status  05/06/2014 101 (H) 65 - 99 mg/dL Final   Glucose, Bld  Date Value Ref Range Status  12/14/2023 141 (H) 70 - 99 mg/dL Final    Comment:    Glucose reference range applies only to samples taken after fasting for at least 8 hours.   Glucose-Capillary  Date Value Ref Range Status  10/22/2023 132 (H) 70 - 99 mg/dL Final    Comment:    Glucose reference range applies only to samples taken after fasting for at least 8 hours.   Potassium  Date Value Ref Range Status  12/14/2023 3.9 3.5 - 5.1 mmol/L Final  05/06/2014 3.5 3.5 - 5.1 mmol/L Final   Sodium  Date Value Ref Range Status  12/14/2023 138 135 - 145 mmol/L Final  05/06/2014 140 136 - 145 mmol/L Final   Total Bilirubin  Date Value Ref Range Status  12/14/2023 0.5 0.0 - 1.2 mg/dL Final   Protein, ur  Date Value Ref Range Status  06/24/2023 1+ (A) NEGATIVE Final   Total Protein  Date Value Ref  Range Status  12/14/2023 7.3 6.5 - 8.1 g/dL Final   EGFR (African  American)  Date Value Ref Range Status  05/06/2014 >60 >56mL/min Final   GFR calc Af Amer  Date Value Ref Range Status  10/20/2019 >60 >60 mL/min Final   eGFR  Date Value Ref Range Status  09/18/2022 85 > OR = 60 mL/min/1.44m2 Final   EGFR (Non-African Amer.)  Date Value Ref Range Status  05/06/2014 >60 >28mL/min Final    Comment:    eGFR values <29mL/min/1.73 m2 may be an indication of chronic kidney disease (CKD). Calculated eGFR, using the MRDR Study equation, is useful in  patients with stable renal function. The eGFR calculation will not be reliable in acutely ill patients when serum creatinine is changing rapidly. It is not useful in patients on dialysis. The eGFR calculation may not be applicable to patients at the low and high extremes of body sizes, pregnant women, and vegetarians.    GFR, Estimated  Date Value Ref Range Status  12/14/2023 >60 >60 mL/min Final    Comment:    (NOTE) Calculated using the CKD-EPI Creatinine Equation (2021)          Signed Prescriptions Disp Refills   venlafaxine  XR (EFFEXOR -XR) 75 MG 24 hr capsule 270 capsule 0    Sig: TAKE 3 CAPSULES BY MOUTH ONCE DAILY WITH BREAKFAST     Psychiatry: Antidepressants - SNRI - desvenlafaxine & venlafaxine  Failed - 02/12/2024  5:12 PM      Failed - Lipid Panel in normal range within the last 12 months    Cholesterol  Date Value Ref Range Status  08/27/2023 190 <200 mg/dL Final   LDL Cholesterol (Calc)  Date Value Ref Range Status  08/27/2023 104 (H) mg/dL (calc) Final    Comment:    Reference range: <100 . Desirable range <100 mg/dL for primary prevention;   <70 mg/dL for patients with CHD or diabetic patients  with > or = 2 CHD risk factors. SABRA LDL-C is now calculated using the Martin-Hopkins  calculation, which is a validated novel method providing  better accuracy than the Friedewald equation in the  estimation of LDL-C.  Elizabeth [name] et al. SANDREA. 7986;689(80): 2061-2068   (http://education.[facility].com/faq/FAQ164)    HDL  Date Value Ref Range Status  08/27/2023 62 > OR = 50 mg/dL Final   Triglycerides  Date Value Ref Range Status  08/27/2023 137 <150 mg/dL Final         Passed - Cr in normal range and within 360 days    Creat  Date Value Ref Range Status  08/27/2023 0.81 0.60 - 1.00 mg/dL Final   Creatinine, Ser  Date Value Ref Range Status  12/14/2023 0.81 0.44 - 1.00 mg/dL Final   Creatinine, Urine  Date Value Ref Range Status  08/27/2023 229 20 - 275 mg/dL Final         Passed - Completed PHQ-2 or PHQ-9 in the last 360 days      Passed - Last BP in normal range    BP Readings from Last 1 Encounters:  02/10/24 138/78         Passed - Valid encounter within last 6 months    Recent Outpatient Visits           1 month ago COPD with acute exacerbation [facility])    [facility] [place] Marsa PARAS, DO   3 months ago Weakness generalized   [facility], Marsa PARAS,  DO   4 months ago Atrial fibrillation with [facility])   [facility] [place], Marsa PARAS, DO   4 months ago Weakness    [facility] Everlene Parris LABOR, MD   5 months ago Annual physical exam   [facility] [place] Marsa PARAS, [place]

## 2024-02-13 ENCOUNTER — Telehealth: Payer: Self-pay

## 2024-02-13 NOTE — Telephone Encounter (Signed)
 Requested Prescriptions  Pending Prescriptions Disp Refills   SPIRIVA  HANDIHALER 18 MCG CAPS [Pharmacy Med Name: Spiriva  HandiHaler 18 MCG Inhalation Capsule] 90 capsule 0    Sig: PLACE ONE CAPSULE INTO INHALER AND INHALE ONCE DAILY     Pulmonology:  Anticholinergic Agents Passed - 02/13/2024 12:50 PM      Passed - Valid encounter within last 12 months    Recent Outpatient Visits           1 month ago COPD with acute exacerbation [facility])   [place] [facility] [place], Marsa PARAS, DO   3 months ago Weakness generalized   [place] [facility] [place], Marsa PARAS, DO   4 months ago Atrial fibrillation with RVR [facility])   [place] [facility] [place] Marsa PARAS, DO   4 months ago Weakness   [place] [facility] Everlene Parris LABOR, MD   5 months ago Annual physical exam   [place] [facility] [employer], Marsa PARAS, DO               albuterol  (VENTOLIN  HFA) 108 [phone] Base) MCG/ACT inhaler [Pharmacy Med Name: Albuterol  Sulfate HFA 108 (90 Base) MCG/ACT Inhalation Aerosol Solution] 54 g 0    Sig: INHALE 2 PUFFS BY MOUTH EVERY 6 HOURS AS NEEDED FOR WHEEZING OR SHORTNESS OF BREATH     Pulmonology:  Beta Agonists 2 Passed - 02/13/2024 12:50 PM      Passed - Last BP in normal range    BP Readings from Last 1 Encounters:  02/10/24 138/78         Passed - Last Heart Rate in normal range    Pulse Readings from Last 1 Encounters:  02/10/24 (!) 56         Passed - Valid encounter within last 12 months    Recent Outpatient Visits           1 month ago COPD with acute exacerbation [facility])   [place] [facility] [place], Marsa PARAS, DO   3 months ago Weakness generalized   [place] [facility] [place], Marsa PARAS, DO   4 months ago Atrial fibrillation with RVR [facility])   [place] [facility] [place], Marsa PARAS,  DO   4 months ago Weakness   [place] [facility] Everlene Parris LABOR, MD   5 months ago Annual physical exam   [facility], Marsa PARAS, [place]

## 2024-02-14 ENCOUNTER — Other Ambulatory Visit: Payer: Self-pay | Admitting: Family Medicine

## 2024-02-14 DIAGNOSIS — D510 Vitamin B12 deficiency anemia due to intrinsic factor deficiency: Secondary | ICD-10-CM

## 2024-02-14 DIAGNOSIS — I10 Essential (primary) hypertension: Secondary | ICD-10-CM

## 2024-02-14 DIAGNOSIS — E538 Deficiency of other specified B group vitamins: Secondary | ICD-10-CM

## 2024-02-14 DIAGNOSIS — E1169 Type 2 diabetes mellitus with other specified complication: Secondary | ICD-10-CM

## 2024-02-14 NOTE — Telephone Encounter (Signed)
 [facility]C [facility] pharmacy to discuss. Per menu, should select option 6 regarding Ohtuvayre . Upon choosing menu option 6, this brought me to a line that continues to ring then went to VM. LVM with request to return call.   Second attempt to call [facility] and speak with pharmacist without choosing option 6. Upon reaching a pharmacist, I was told they do not have records on this patient. Per [facility] pharmacist, this patient has not filled Ohtuvayre  with their pharmacy in the past and they do not have a profile for her.   Claims show fills with [facility]. Will send patient message to advise her to contact [facility] for refill.

## 2024-02-14 NOTE — Telephone Encounter (Signed)
 Received Ohtuvayre  new start paperwork. Per claims pt is already on the medication and it was last filled in July. Unclear why this was treated as a new start or why the patient hasn't been filling it. Will send message to pt advising her to contact [facility] for a refill.

## 2024-02-17 NOTE — Telephone Encounter (Signed)
 Too soon for refill.  Requested Prescriptions  Pending Prescriptions Disp Refills   losartan  (COZAAR ) 50 MG tablet [Pharmacy Med Name: Losartan  Potassium 50 MG Oral Tablet] 90 tablet 0    Sig: Take 1 tablet by mouth once daily     Cardiovascular:  Angiotensin Receptor Blockers Passed - 02/17/2024  8:51 AM      Passed - Cr in normal range and within 180 days    Creat  Date Value Ref Range Status  08/27/2023 0.81 0.60 - 1.00 mg/dL Final   Creatinine, Ser  Date Value Ref Range Status  12/14/2023 0.81 0.44 - 1.00 mg/dL Final   Creatinine, Urine  Date Value Ref Range Status  08/27/2023 229 20 - 275 mg/dL Final         Passed - K in normal range and within 180 days    Potassium  Date Value Ref Range Status  12/14/2023 3.9 3.5 - 5.1 mmol/L Final  05/06/2014 3.5 3.5 - 5.1 mmol/L Final         Passed - Patient is not pregnant      Passed - Last BP in normal range    BP Readings from Last 1 Encounters:  02/10/24 138/78         Passed - Valid encounter within last 6 months    Recent Outpatient Visits           1 month ago COPD with acute exacerbation [facility])   [place] [facility] [place] Marsa PARAS, DO   3 months ago Weakness generalized   [facility] [place], Marsa PARAS, DO   4 months ago Atrial fibrillation with RVR [facility])   [place] [facility] [place] Marsa PARAS, DO   4 months ago Weakness   [place] [facility] Everlene Parris LABOR, MD   5 months ago Annual physical exam   [place] [facility] [place], Alexander J, DO               RYBELSUS  3 MG TABS [Pharmacy Med Name: Rybelsus  3 MG Oral Tablet] 30 tablet 0    Sig: TAKE 1 TABLET BY MOUTH ONCE DAILY 30 MINUTES BEFORE BREAKFAST OR MEDICATION.     Off-Protocol Failed - 02/17/2024  8:51 AM      Failed - Medication not assigned to a protocol, review manually.      Passed - Valid encounter  within last 12 months    Recent Outpatient Visits           1 month ago COPD with acute exacerbation [facility])   [place] [facility] [place], Marsa PARAS, DO   3 months ago Weakness generalized   [place] [facility] [place], Marsa PARAS, DO   4 months ago Atrial fibrillation with RVR [facility])   [place] [facility] [place] Marsa PARAS, DO   4 months ago Weakness   [place] [facility] Everlene Parris LABOR, MD   5 months ago Annual physical exam    [facility] [place], Marsa PARAS, DO               MAGNESIUM -OXIDE 400 (240 Mg) MG tablet [Pharmacy Med Name: MAGnesium -Oxide 400 (240 Mg) MG Oral Tablet] 90 tablet 0    Sig: Take 1 tablet by mouth once daily     Endocrinology:  Minerals - Magnesium  Supplementation Failed - 02/17/2024  8:51 AM  Failed - Mg Level in normal range and within 360 days    Magnesium   Date Value Ref Range Status  10/19/2023 2.7 (H) 1.7 - 2.4 mg/dL Final    Comment:    Performed at [facility], [address] [place], [place] [address]         Passed - Cr in normal range and within 360 days    Creat  Date Value Ref Range Status  08/27/2023 0.81 0.60 - 1.00 mg/dL Final   Creatinine, Ser  Date Value Ref Range Status  12/14/2023 0.81 0.44 - 1.00 mg/dL Final   Creatinine, Urine  Date Value Ref Range Status  08/27/2023 229 20 - 275 mg/dL Final         Passed - Valid encounter within last 12 months    Recent Outpatient Visits           1 month ago COPD with acute exacerbation (HCC)   [place] [facility] [place], Marsa PARAS, DO   3 months ago Weakness generalized   [place] [facility] [place], Marsa PARAS, DO   4 months ago Atrial fibrillation with RVR [facility])   [place] [facility] [place], Marsa PARAS, DO   4 months ago Weakness   [place] [facility] [place], Parris LABOR, MD   5 months ago Annual physical exam   [place] [facility] [place], Marsa PARAS, DO               cyanocobalamin  (VITAMIN B12) 1000 MCG/ML injection [Pharmacy Med Name: Cyanocobalamin  1000 MCG/ML Injection Solution] 3 mL 0    Sig: INJECT 1 ML INTRAMUSCULARLY ONCE EVERY 30 DAYS     Endocrinology:  Vitamins - Vitamin B12 Failed - 02/17/2024  8:51 AM      Failed - HGB in normal range and within 360 days    Hemoglobin  Date Value Ref Range Status  12/14/2023 11.8 (L) 12.0 - 15.0 g/dL Final   HGB  Date Value Ref Range Status  05/06/2014 13.2 12.0 - 16.0 g/dL Final         Passed - HCT in normal range and within 360 days    HCT  Date Value Ref Range Status  12/14/2023 36.6 36.0 - 46.0 % Final  05/06/2014 40.0 35.0 - 47.0 % Final         Passed - B12 Level in normal range and within 360 days    Vitamin B-12  Date Value Ref Range Status  11/11/2023 369 180 - 914 pg/mL Final    Comment:    (NOTE) This assay is not validated for testing neonatal or myeloproliferative syndrome specimens for Vitamin B12 levels. Performed at [facility], [address] [place], [place] [address]          Passed - Valid encounter within last 12 months    Recent Outpatient Visits           1 month ago COPD with acute exacerbation [facility])   [place] [facility] [place] Marsa PARAS, DO   3 months ago Weakness generalized   [place] [facility] [place], Marsa PARAS, DO   4 months ago Atrial fibrillation with R[facility])   [place] [facility] [place] Marsa PARAS, DO   4 months ago Weakness   [place] [facility] Everlene Parris LABOR, MD   5 months ago Annual  physical exam   [facility] [place], Marsa PARAS, [place]

## 2024-02-26 ENCOUNTER — Other Ambulatory Visit: Payer: Self-pay

## 2024-02-28 NOTE — Patient Instructions (Signed)
 Visit Information  Thank you for taking time to visit with me today. Please don't hesitate to contact me if I can be of assistance to you before our next scheduled appointment.  Your next care management appointment is by telephone on Tuesday, December 23rd at 2:00pm  Please call the care guide team at [phone] if you need to cancel, schedule, or reschedule an appointment.   Please call the [place]  National Suicide Prevention Lifeline: (478)024-9544 or TTY: 919-248-7474 TTY [phone]) to talk to a trained counselor if you are experiencing a Mental Health or Behavioral Health Crisis or need someone to talk to.  Santana Stamp BSN, CCM [place]  VBCI Population Health RN Care Manager Direct Dial: [phone]  Fax: [phone]

## 2024-02-28 NOTE — Patient Outreach (Signed)
 Complex Care Management   Visit Note  02/28/2024  Name:  Elizabeth [name] MRN: [id number] DOB: Sep 04, 1950  Situation: Referral received for Complex Care Management related to COPD I obtained verbal consent from Patient.  Visit completed with Elizabeth [name]  on the phone.    Background:   Urgent Care visit 01/21/24 for COPD exacerbation, prescribed Augmentin  x 7 days, , prednisone  x 5 days, continue breathing treatments as ordered Past Medical History:  Diagnosis Date   Allergy    Anxiety    B12 deficiency    Carpal tunnel syndrome    Centrilobular emphysema (HCC)    COPD (chronic obstructive pulmonary disease) (HCC)    Depression    Genetic testing    Common Cancers panel (47 genes) @ [facility] - No pathogenic mutations detected   HLD (hyperlipidemia)    Hyperlipidemia    Hypertension    Iron  deficiency anemia 10/16/2016   Lipoma of skin    Lung nodule    Normocytic anemia 10/16/2016   QT prolongation    Substance abuse (HCC)     Assessment: Patient Reported Symptoms:  Cognitive Cognitive Status: Alert and oriented to person, place, and time, Insightful and able to interpret abstract concepts, Normal speech and language skills      Neurological Neurological Review of Symptoms: No symptoms reported    HEENT HEENT Symptoms Reported: No symptoms reported      Cardiovascular Cardiovascular Symptoms Reported: No symptoms reported    Respiratory Respiratory Symptoms Reported: Shortness of breath Additional Respiratory Details: Shortness of breath at baseline    Endocrine Endocrine Symptoms Reported: Not assessed (A1C 6.7%) Is patient diabetic?: Yes    Gastrointestinal Gastrointestinal Symptoms Reported: Nausea, Diarrhea Additional Gastrointestinal Details: Reports she must have a stomach bug, able to eat foods today. Educated on staying hydrated, hand washing, wiping down surfaces to prevent spread      Genitourinary Genitourinary Symptoms Reported: No symptoms reported     Integumentary Integumentary Symptoms Reported: Not assessed    Musculoskeletal Musculoskelatal Symptoms Reviewed: No symptoms reported        Psychosocial Psychosocial Symptoms Reported: No symptoms reported          02/28/2024    PHQ2-9 Depression Screening   Little interest or pleasure in doing things    Feeling down, depressed, or hopeless    PHQ-2 - Total Score    Trouble falling or staying asleep, or sleeping too much    Feeling tired or having little energy    Poor appetite or overeating     Feeling bad about yourself - or that you are a failure or have let yourself or your family down    Trouble concentrating on things, such as reading the newspaper or watching television    Moving or speaking so slowly that other people could have noticed.  Or the opposite - being so fidgety or restless that you have been moving around a lot more than usual    Thoughts that you would be better off dead, or hurting yourself in some way    PHQ2-9 Total Score    If you checked off any problems, how difficult have these problems made it for you to do your work, take care of things at home, or get along with other people    Depression Interventions/Treatment      There were no vitals filed for this visit. Pain Scale: 0-10 Pain Score: 0-No pain  MEDICATION: No concerns with meds, no problems with getting refills.  Recommendation:   Specialty provider follow-up :Cardiology 03/02/24; Oncology 03/16/24 Educated on taking inhalers as prescribed, discussed red flag symptoms that would warrant call to Pulmonology or trip to ED.  Follow Up Plan:   Telephone follow-up in 1 month  Santana Stamp BSN, CCM [place]  VBCI Population Health RN Care Manager Direct Dial: [phone]  Fax: [phone]

## 2024-03-02 ENCOUNTER — Telehealth: Payer: Self-pay | Admitting: Oncology

## 2024-03-02 ENCOUNTER — Other Ambulatory Visit: Payer: Self-pay | Admitting: *Deleted

## 2024-03-02 DIAGNOSIS — K219 Gastro-esophageal reflux disease without esophagitis: Secondary | ICD-10-CM | POA: Diagnosis not present

## 2024-03-02 DIAGNOSIS — I10 Essential (primary) hypertension: Secondary | ICD-10-CM | POA: Diagnosis not present

## 2024-03-02 DIAGNOSIS — C3490 Malignant neoplasm of unspecified part of unspecified bronchus or lung: Secondary | ICD-10-CM

## 2024-03-02 DIAGNOSIS — I4891 Unspecified atrial fibrillation: Secondary | ICD-10-CM | POA: Diagnosis not present

## 2024-03-02 DIAGNOSIS — I2510 Atherosclerotic heart disease of native coronary artery without angina pectoris: Secondary | ICD-10-CM | POA: Diagnosis not present

## 2024-03-02 DIAGNOSIS — E782 Mixed hyperlipidemia: Secondary | ICD-10-CM | POA: Diagnosis not present

## 2024-03-02 DIAGNOSIS — G4733 Obstructive sleep apnea (adult) (pediatric): Secondary | ICD-10-CM | POA: Diagnosis not present

## 2024-03-02 DIAGNOSIS — E669 Obesity, unspecified: Secondary | ICD-10-CM | POA: Diagnosis not present

## 2024-03-02 DIAGNOSIS — J9611 Chronic respiratory failure with hypoxia: Secondary | ICD-10-CM | POA: Diagnosis not present

## 2024-03-02 DIAGNOSIS — R600 Localized edema: Secondary | ICD-10-CM | POA: Diagnosis not present

## 2024-03-02 DIAGNOSIS — R0602 Shortness of breath: Secondary | ICD-10-CM

## 2024-03-02 NOTE — Telephone Encounter (Signed)
 Pt daughter called to move the lab appt to this week. Lab appt r.s and new date/time confirmed with pt daughter

## 2024-03-02 NOTE — Telephone Encounter (Signed)
 Called pt to sched CT - confirmed date/time/location w/pt - LH

## 2024-03-03 ENCOUNTER — Inpatient Hospital Stay: Attending: Oncology

## 2024-03-03 ENCOUNTER — Inpatient Hospital Stay

## 2024-03-03 ENCOUNTER — Ambulatory Visit
Admission: RE | Admit: 2024-03-03 | Discharge: 2024-03-03 | Disposition: A | Source: Ambulatory Visit | Attending: Oncology | Admitting: Oncology

## 2024-03-03 DIAGNOSIS — Z7901 Long term (current) use of anticoagulants: Secondary | ICD-10-CM | POA: Diagnosis not present

## 2024-03-03 DIAGNOSIS — R0602 Shortness of breath: Secondary | ICD-10-CM | POA: Insufficient documentation

## 2024-03-03 DIAGNOSIS — C3490 Malignant neoplasm of unspecified part of unspecified bronchus or lung: Secondary | ICD-10-CM | POA: Insufficient documentation

## 2024-03-03 DIAGNOSIS — D509 Iron deficiency anemia, unspecified: Secondary | ICD-10-CM | POA: Diagnosis present

## 2024-03-03 DIAGNOSIS — Z23 Encounter for immunization: Secondary | ICD-10-CM | POA: Diagnosis not present

## 2024-03-03 DIAGNOSIS — F17290 Nicotine dependence, other tobacco product, uncomplicated: Secondary | ICD-10-CM | POA: Diagnosis not present

## 2024-03-03 DIAGNOSIS — Z79899 Other long term (current) drug therapy: Secondary | ICD-10-CM | POA: Insufficient documentation

## 2024-03-03 DIAGNOSIS — J432 Centrilobular emphysema: Secondary | ICD-10-CM | POA: Diagnosis not present

## 2024-03-03 HISTORY — DX: Malignant (primary) neoplasm, unspecified: C80.1

## 2024-03-03 LAB — CBC WITH DIFFERENTIAL/PLATELET
Abs Immature Granulocytes: 0.06 K/uL (ref 0.00–0.07)
Basophils Absolute: 0 K/uL (ref 0.0–0.1)
Basophils Relative: 0 %
Eosinophils Absolute: 0 K/uL (ref 0.0–0.5)
Eosinophils Relative: 0 %
HCT: 37.3 % (ref 36.0–46.0)
Hemoglobin: 12 g/dL (ref 12.0–15.0)
Immature Granulocytes: 1 %
Lymphocytes Relative: 28 %
Lymphs Abs: 2.4 K/uL (ref 0.7–4.0)
MCH: 27.3 pg (ref 26.0–34.0)
MCHC: 32.2 g/dL (ref 30.0–36.0)
MCV: 84.8 fL (ref 80.0–100.0)
Monocytes Absolute: 0.6 K/uL (ref 0.1–1.0)
Monocytes Relative: 8 %
Neutro Abs: 5.4 K/uL (ref 1.7–7.7)
Neutrophils Relative %: 63 %
Platelets: 205 K/uL (ref 150–400)
RBC: 4.4 MIL/uL (ref 3.87–5.11)
RDW: 14.1 % (ref 11.5–15.5)
WBC: 8.5 K/uL (ref 4.0–10.5)
nRBC: 0 % (ref 0.0–0.2)

## 2024-03-03 LAB — IRON AND TIBC
Iron: 46 ug/dL (ref 28–170)
Saturation Ratios: 17 % (ref 10.4–31.8)
TIBC: 267 ug/dL (ref 250–450)
UIBC: 222 ug/dL

## 2024-03-03 LAB — POCT I-STAT CREATININE: Creatinine, Ser: 0.9 mg/dL (ref 0.44–1.00)

## 2024-03-03 LAB — FERRITIN: Ferritin: 235 ng/mL (ref 11–307)

## 2024-03-03 MED ORDER — IOHEXOL 300 MG/ML  SOLN
75.0000 mL | Freq: Once | INTRAMUSCULAR | Status: AC | PRN
  Administered 2024-03-03: 75 mL via INTRAVENOUS

## 2024-03-16 ENCOUNTER — Other Ambulatory Visit

## 2024-03-17 ENCOUNTER — Other Ambulatory Visit: Payer: Self-pay

## 2024-03-17 ENCOUNTER — Ambulatory Visit

## 2024-03-17 ENCOUNTER — Encounter: Payer: Self-pay | Admitting: Oncology

## 2024-03-17 ENCOUNTER — Inpatient Hospital Stay

## 2024-03-17 ENCOUNTER — Ambulatory Visit: Admitting: Oncology

## 2024-03-17 VITALS — BP 140/61 | HR 62 | Temp 97.6°F | Resp 17 | Wt 196.6 lb

## 2024-03-17 DIAGNOSIS — C3490 Malignant neoplasm of unspecified part of unspecified bronchus or lung: Secondary | ICD-10-CM | POA: Diagnosis not present

## 2024-03-17 DIAGNOSIS — D509 Iron deficiency anemia, unspecified: Secondary | ICD-10-CM | POA: Diagnosis not present

## 2024-03-17 DIAGNOSIS — Z23 Encounter for immunization: Secondary | ICD-10-CM

## 2024-03-17 MED ORDER — INFLUENZA VAC SPLIT HIGH-DOSE 0.5 ML IM SUSY
0.5000 mL | PREFILLED_SYRINGE | Freq: Once | INTRAMUSCULAR | Status: AC
  Administered 2024-03-17: 11:00:00 0.5 mL via INTRAMUSCULAR
  Filled 2024-03-17: qty 0.5

## 2024-03-17 NOTE — Progress Notes (Unsigned)
  [facility]  [phone]) [phone] [phone]  ID: Lynn Recendiz OB: 05/31/1950  MR#: [id number]  RDW#:[id number]  Patient Care Team: Edman Marsa PARAS, DO as PCP - General (Family Medicine) Lenn Aran, MD as Radiation Oncologist (Radiation Oncology) Jacobo Evalene PARAS, MD as Consulting Physician (Oncology) Slusher, Santana LABOR, RN as Registered Nurse  CHIEF COMPLAINT: Iron  deficiency anemia.  INTERVAL HISTORY: Patient referred back to clinic for consideration of IV Venofer  after hospital admission and found to have iron  deficiency anemia.  She continues to have significant weakness and fatigue, but feels improved since discharge.  She has chronic shortness of breath. She has no neurologic complaints.  She denies any fevers.  She has a fair appetite and denies weight loss.  She has no chest pain, cough, or hemoptysis.  She denies any nausea, vomiting, constipation, or diarrhea.  She has no melena or hematochezia.  She has no urinary complaints.  Patient offers no further specific complaints today.  REVIEW OF SYSTEMS:   Review of Systems  Constitutional:  Positive for malaise/fatigue. Negative for fever and weight loss.  Respiratory:  Positive for shortness of breath. Negative for cough and hemoptysis.   Cardiovascular: Negative.  Negative for chest pain and leg swelling.  Gastrointestinal: Negative.  Negative for abdominal pain.  Genitourinary: Negative.  Negative for dysuria.  Musculoskeletal: Negative.  Negative for back pain.  Skin: Negative.  Negative for rash.  Neurological:  Positive for weakness. Negative for dizziness, focal weakness and headaches.  Psychiatric/Behavioral: Negative.  The patient is not nervous/anxious.     As per HPI. Otherwise, a complete review of systems is negative.  PAST MEDICAL HISTORY: Past Medical History:  Diagnosis Date   Allergy    Anxiety    B12 deficiency    Cancer (HCC)    Carpal tunnel syndrome     Centrilobular emphysema (HCC)    COPD (chronic obstructive pulmonary disease) (HCC)    Depression    Genetic testing    Common Cancers panel (47 genes) @ [facility] - No pathogenic mutations detected   HLD (hyperlipidemia)    Hyperlipidemia    Hypertension    Iron  deficiency anemia 10/16/2016   Lipoma of skin    Lung nodule    Normocytic anemia 10/16/2016   QT prolongation    Substance abuse (HCC)     PAST SURGICAL HISTORY: Past Surgical History:  Procedure Laterality Date   ABDOMINAL HYSTERECTOMY     BREAST EXCISIONAL BIOPSY Left    1980's neg   CESAREAN SECTION     TUBAL LIGATION      FAMILY HISTORY: Family History  Problem Relation Age of Onset   Ovarian cancer Mother 39       deceased 39   Breast cancer Mother 35   Stroke Father    Lung cancer Paternal Aunt    Lung cancer Cousin     ADVANCED DIRECTIVES (Y/N):  N  HEALTH MAINTENANCE: Social History   Tobacco Use   Smoking status: Former    Current packs/day: 0.00    Average packs/day: 0.5 packs/day for 51.9 years (25.9 ttl pk-yrs)    Types: Cigarettes    Start date: 12/02/1971    Quit date: 10/18/2023    Years since quitting: 0.4   Smokeless tobacco: Never   Tobacco comments:    Quit smoking 10/18/2023        Started smoking at 73 years old.    Smoked 1.5PPD at her heaviest  Vaping Use   Vaping status: Some  Days  Substance Use Topics   Alcohol use: No   Drug use: No     Colonoscopy:  PAP:  Bone density:  Lipid panel:  Allergies  Allergen Reactions   Moxifloxacin Itching, Other (See Comments), Photosensitivity, Rash and Swelling    Causes swelling, redness, burning in eyes    Levofloxacin Other (See Comments)    Other reaction(s): Muscle Pain  Other reaction(s): Muscle Pain Other reaction(s): Muscle Pain   Amoxicillin -Pot Clavulanate Nausea Only and Other (See Comments)    Other Reaction: DIARRHEA.  Other reaction(s): Other (See Comments)  Other Reaction: DIARRHEA. Other reaction(s): Other  (See Comments) Other Reaction: DIARRHEA. Other Reaction: DIARRHEA.  Other reaction(s): Other (See Comments) Other Reaction: DIARRHEA.    Other Reaction: DIARRHEA. Other Reaction: DIARRHEA.  Other reaction(s): Other (See Comments) Other Reaction: DIARRHEA.    Current Outpatient Medications  Medication Sig Dispense Refill   albuterol  (VENTOLIN  HFA) 108 (90 Base) MCG/ACT inhaler INHALE 2 PUFFS BY MOUTH EVERY 6 HOURS AS NEEDED FOR WHEEZING OR SHORTNESS OF BREATH 54 g 0   apixaban  (ELIQUIS ) 5 MG TABS tablet Take 1 tablet (5 mg total) by mouth 2 (two) times daily. (Patient taking differently: Take 5 mg by mouth daily.) 60 tablet 0   ARIPiprazole  (ABILIFY ) 5 MG tablet Take 1 tablet by mouth once daily 90 tablet 0   cyanocobalamin  (VITAMIN B12) 1000 MCG/ML injection INJECT  1ML  INTRAMUSCULARLY ONCE EVERY 30 DAYS 3 mL 0   diltiazem (CARDIZEM CD) 120 MG 24 hr capsule Take 120 mg by mouth.     furosemide  (LASIX ) 40 MG tablet Take 40 mg by mouth.  Take 1 tablet (40 mg total) by mouth once daily (Patient taking differently: Take 20 mg by mouth daily.  Take 1 tablet (40 mg total) by mouth once daily)     losartan  (COZAAR ) 50 MG tablet Take 1 tablet by mouth once daily 90 tablet 0   MAGNESIUM -OXIDE 400 (240 Mg) MG tablet Take 1 tablet by mouth once daily 90 tablet 0   metoprolol  succinate (TOPROL -XL) 25 MG 24 hr tablet Take 1 tablet (25 mg total) by mouth daily. Take with or immediately following a meal. 90 tablet 1   Multiple Vitamin (MULTI-VITAMINS) TABS Take by mouth.     Nebulizers (PARI PRONEB MAX LC SPRINT) MISC      omeprazole  (PRILOSEC) 20 MG capsule TAKE 1 CAPSULE BY MOUTH ONCE DAILY BEFORE BREAKFAST 90 capsule 2   potassium chloride  SA (KLOR-CON  M20) 20 MEQ tablet Take 1 tablet by mouth once daily 90 tablet 2   rosuvastatin  (CRESTOR ) 40 MG tablet Take 40 mg by mouth daily.     RYBELSUS  3 MG TABS Take 1 tablet (3 mg total) by mouth daily before breakfast. 30 min before med or food. For 30 days, then  dose will increase. 30 tablet 0   SPIRIVA  HANDIHALER 18 MCG CAPS PLACE ONE CAPSULE INTO INHALER AND INHALE ONCE DAILY 90 capsule 0   SYMBICORT 160-4.5 MCG/ACT inhaler Inhale 2 puffs into the lungs.     venlafaxine  XR (EFFEXOR -XR) 75 MG 24 hr capsule TAKE 3 CAPSULES BY MOUTH ONCE DAILY WITH BREAKFAST 270 capsule 0   busPIRone  (BUSPAR ) 5 MG tablet Take 1 tablet (5 mg total) by mouth 3 (three) times daily. (Patient not taking: Reported on 03/17/2024) 90 tablet 0   ipratropium (ATROVENT ) 0.06 % nasal spray Place 2 sprays into both nostrils 4 (four) times daily. (Patient not taking: Reported on 03/17/2024) 15 mL 12   promethazine -dextromethorphan (  PROMETHAZINE -DM) 6.25-15 MG/5ML syrup Take 5 mLs by mouth 4 (four) times daily as needed. (Patient not taking: Reported on 03/17/2024) 118 mL 0   saccharomyces boulardii (FLORASTOR) 250 MG capsule Take 1 capsule (250 mg total) by mouth 2 (two) times daily. (Patient not taking: Reported on 03/17/2024) 30 capsule 0   No current facility-administered medications for this visit.    OBJECTIVE: Vitals:   03/17/24 0958  BP: (!) 140/61  Pulse: 62  Resp: 17  Temp: 97.6 F (36.4 C)  SpO2: 95%      Body mass index is 35.96 kg/m.    ECOG FS:2 - Symptomatic, <50% confined to bed  General: Well-developed, well-nourished, no acute distress.  Sitting in a wheelchair. Eyes: Pink conjunctiva, anicteric sclera. HEENT: Normocephalic, moist mucous membranes. Lungs: No audible wheezing or coughing. Heart: Regular rate and rhythm. Abdomen: Soft, nontender, no obvious distention. Musculoskeletal: No edema, cyanosis, or clubbing. Neuro: Alert, answering all questions appropriately. Cranial nerves grossly intact. Skin: No rashes or petechiae noted. Psych: Normal affect.  LAB RESULTS:  Lab Results  Component Value Date   NA 138 12/14/2023   K 3.9 12/14/2023   CL 100 12/14/2023   CO2 30 12/14/2023   GLUCOSE 141 (H) 12/14/2023   BUN 11 12/14/2023   CREATININE  0.90 03/03/2024   CALCIUM  8.8 (L) 12/14/2023   PROT 7.3 12/14/2023   ALBUMIN 3.9 12/14/2023   AST 25 12/14/2023   ALT 17 12/14/2023   ALKPHOS 96 12/14/2023   BILITOT 0.5 12/14/2023   GFRNONAA >60 12/14/2023   GFRAA >60 10/20/2019    Lab Results  Component Value Date   WBC 8.5 03/03/2024   NEUTROABS 5.4 03/03/2024   HGB 12.0 03/03/2024   HCT 37.3 03/03/2024   MCV 84.8 03/03/2024   PLT 205 03/03/2024   Lab Results  Component Value Date   IRON  46 03/03/2024   TIBC 267 03/03/2024   IRONPCTSAT 17 03/03/2024   Lab Results  Component Value Date   FERRITIN 235 03/03/2024     STUDIES: CT CHEST W CONTRAST Result Date: 03/07/2024 EXAM: CT CHEST WITH CONTRAST 03/03/2024 12:00:40 PM TECHNIQUE: CT of the chest was performed with the administration of 75 mL of iohexol  (OMNIPAQUE ) 300 MG/ML solution. Multiplanar reformatted images are provided for review. Automated exposure control, iterative reconstruction, and/or weight based adjustment of the mA/kV was utilized to reduce the radiation dose to as low as reasonably achievable. COMPARISON: None available. CLINICAL HISTORY: worsening shortness of breath, lung cancer FINDINGS: MEDIASTINUM: Heart size is normal. No substantial pericardial effusion. Coronary artery atherosclerotic calcification evident. Mild atherosclerotic calcification is noted in the wall of the thoracic aorta. The central airways are clear. LYMPH NODES: No mediastinal, hilar or axillary lymphadenopathy. LUNGS AND PLEURA: Centrilobular emphysema. Interval clearing of the right lower lobe consolidative disease seen on the previous exam. Post-treatment changes in the peripheral aspect of the anterior left lower lobe are similar although opacity is progressive along the anterior aspect of the scarring (compare image 69 of series 3 today to image 83 of series 5 previously). Tiny nodules in the right upper lung seen in the paraspinal region on 38/3 and posterior to the major fissure on  29/3 are similar to prior. No focal consolidation or pulmonary edema. No pleural effusion or pneumothorax. SOFT TISSUES/BONES: Left-sided rib fractures evident. No worrisome lytic or sclerotic osseous abnormality. No acute abnormality of the soft tissues. UPPER ABDOMEN: Limited images of the upper abdomen demonstrate tiny calcified gallstones. Nodular thickening of both adrenal glands  is stable in the interval. IMPRESSION: 1. No definite evidence of new or progressive metastatic disease within the chest. Interval clearing of prior right lower lobe consolidation supports resolving infection or inflammation. 2. Progressive opacity along the anterior aspect of established left lower lobe post-treatment scarring, which may reflect evolving post-therapy change; short-interval chest CT follow-up is recommended to ensure stability. Electronically signed by: Camellia Candle MD 03/07/2024 10:52 AM EST RP Workstation: [id number]    ASSESSMENT: Iron  deficiency anemia.  PLAN:    Iron  deficiency anemia: Patient noted to have decreased hemoglobin iron  stores during her hospital admission and was referred for IV iron .  She has not received any treatment since October 26, 2016.  Patient will return to clinic 5 times over the next 1 to 2 weeks to receive 200 mg IV Venofer .  She would then return to clinic in 4 months with repeat laboratory work, further evaluation, and continuation of treatment if needed. 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  She completed treatment in approximately May 2021.  Patient's  most recent CT scan on October 19, 2023 did not reveal any evidence of recurrent or progressive disease.  Patient will require repeat imaging in July 2026.   Shortness of breath: Chronic and unchanged.  Patient has been instructed to continue follow-up with pulmonary as indicated. Weakness and fatigue: Multifactorial.  IV iron  as above.  I spent a total of  30 minutes reviewing chart data, face-to-face evaluation with the patient, counseling and coordination of care as detailed above.     Evalene JINNY Reusing, MD   03/17/2024 10:08 AM

## 2024-03-17 NOTE — Progress Notes (Unsigned)
 Patient states she has been real weak and short of breath. Patient states she stay weak and dizzy. Patient states she has lumps on the left side of the neck and would like MD  to check it out.  Patient is requesting a flu shot.

## 2024-03-18 ENCOUNTER — Telehealth: Payer: Self-pay | Admitting: Oncology

## 2024-03-18 ENCOUNTER — Encounter: Payer: Self-pay | Admitting: Oncology

## 2024-03-18 NOTE — Telephone Encounter (Signed)
 Called pt to sched CT - spoke w/pt and pt daughter - pt daughter confirmed date/time/location of CT - expected date said mid-Jan, but pt daughter said that Georgina wanted it beginning of June (MD notes confirmed this) - sched for 1 week prior to MD follow up - pt daughter requested appt reminder via mail - [facility]

## 2024-03-23 ENCOUNTER — Other Ambulatory Visit: Payer: Self-pay

## 2024-03-23 ENCOUNTER — Encounter: Payer: Self-pay | Admitting: Family Medicine

## 2024-03-23 DIAGNOSIS — J432 Centrilobular emphysema: Secondary | ICD-10-CM

## 2024-03-23 MED ORDER — SYMBICORT 160-4.5 MCG/ACT IN AERO: 2.0000 | INHALATION_SPRAY | Freq: Two times a day (BID) | RESPIRATORY_TRACT | 12 refills | Status: AC

## 2024-03-24 ENCOUNTER — Other Ambulatory Visit: Payer: Self-pay

## 2024-03-24 ENCOUNTER — Telehealth: Payer: Self-pay

## 2024-03-24 NOTE — Telephone Encounter (Signed)
 LMTCB. E2C2 please advise when patient calls back.

## 2024-03-24 NOTE — Patient Instructions (Signed)
 Visit Information  Thank you for taking time to visit with me today. Please don't hesitate to contact me if I can be of assistance to you before our next scheduled appointment.  Your next care management appointment is by telephone on Tuesday, January 6th at 2:00pm   Please call the care guide team at [phone] if you need to cancel, schedule, or reschedule an appointment.   Please call the [place]  National Suicide Prevention Lifeline: 4061149729 or TTY: [phone] TTY [phone])[phone]) to talk to a trained counselor if you are experiencing a Mental Health or Behavioral Health Crisis or need someone to talk to.  Santana Stamp BSN, CCM [place]  [employer]BC[employer] Population Health RN Care Manager Direct Dial: [phone]  Fax: [phone]

## 2024-03-24 NOTE — Patient Outreach (Signed)
 Complex Care Management   Visit Note  03/24/2024  Name:  Elizabeth [name] MRN: [id number] DOB: 16-Apr-1950  Situation: Referral received for Complex Care Management related to COPD I obtained verbal consent from Patient.  Visit completed with Elizabeth [name]  on the phone  Background:   Past Medical History:  Diagnosis Date   Allergy    Anxiety    B12 deficiency    Cancer (HCC)    Carpal tunnel syndrome    Centrilobular emphysema (HCC)    COPD (chronic obstructive pulmonary disease) (HCC)    Depression    Genetic testing    Common Cancers panel (47 genes) @ [facility] - No pathogenic mutations detected   HLD (hyperlipidemia)    Hyperlipidemia    Hypertension    Iron  deficiency anemia 10/16/2016   Lipoma of skin    Lung nodule    Normocytic anemia 10/16/2016   QT prolongation    Substance abuse (HCC)     Assessment: Patient Reported Symptoms:  Cognitive Cognitive Status: Alert and oriented to person, place, and time, Insightful and able to interpret abstract concepts, Normal speech and language skills      Neurological Neurological Review of Symptoms: Not assessed    HEENT HEENT Symptoms Reported: Not assessed      Cardiovascular Cardiovascular Symptoms Reported: Not assessed    Respiratory Respiratory Symptoms Reported: Shortness of breath Additional Respiratory Details: SHOB at baseline, no worse. Uses oxygen at night, 2L O2 [place].  Has Symbicort , Spiriva , albuterol  nebulizer, and now has Ohtuvayre  in her home to use as ordered.  Conf. call with [facility] to inquire if she should still be using albuterol  neb four times a day while taking Ohtuvayre  twice a day by nebulizer.  Staff will return her call.    Endocrine Endocrine Symptoms Reported: Not assessed    Gastrointestinal Gastrointestinal Symptoms Reported: Not assessed      Genitourinary Genitourinary Symptoms Reported: Not assessed    Integumentary Integumentary Symptoms Reported: Not assessed     Musculoskeletal Musculoskelatal Symptoms Reviewed: Not assessed        Psychosocial Psychosocial Symptoms Reported: No symptoms reported          03/24/2024    PHQ2-9 Depression Screening   Little interest or pleasure in doing things    Feeling down, depressed, or hopeless    PHQ-2 - Total Score    Trouble falling or staying asleep, or sleeping too much    Feeling tired or having little energy    Poor appetite or overeating     Feeling bad about yourself - or that you are a failure or have let yourself or your family down    Trouble concentrating on things, such as reading the newspaper or watching television    Moving or speaking so slowly that other people could have noticed.  Or the opposite - being so fidgety or restless that you have been moving around a lot more than usual    Thoughts that you would be better off dead, or hurting yourself in some way    PHQ2-9 Total Score    If you checked off any problems, how difficult have these problems made it for you to do your work, take care of things at home, or get along with other people    Depression Interventions/Treatment      There were no vitals filed for this visit.    MEDICATIONS: Assessed for refills, she inquired about Ohtuvayre , thought she should be taking it as per  new orders from new Pulmonology.  This RNCM reviewed OV notes from 11/10, patient then saw she does have a box of Ohtuvayre  in her home that was sent to her earlier in the year.  Conference call with [facility] to inquire if she should be taking albuterol  four times a day along with Ohtuvayre  twice daily, staff will consult with MD and call patient back. No other problems with refills, she now has three months worth of Symbicort .    Recommendation:   Specialty provider follow-up Cardiology 05/05/2024; Pulmonology 05/19/2024  Follow Up Plan:   Telephone follow-up two weeks  Santana Stamp BSN, CCM [place]  [facility] Population Health RN Care  Manager Direct Dial: [phone]  Fax: [phone]

## 2024-03-24 NOTE — Telephone Encounter (Signed)
 Copied from CRM #[id number]. Topic: Clinical - Medication Question >> Mar 24, 2024  2:37 PM Devaughn RAMAN wrote: Reason for CRM:  Haskel with [facility] Nurse Case Manager called with the pt to inquire if she should still be taking her albuterol  nebulizer along with Ohtuvayre . Pt received Ohtuvayre  and would like to begin medication and is inquiring. Please f/u with pt regarding this. Pt would like a callback at [phone].

## 2024-03-24 NOTE — Telephone Encounter (Signed)
 Hey Aleck can you advise?

## 2024-03-25 NOTE — Telephone Encounter (Signed)
 Spoke with pt and daughter to relay message and they stated they weren't asking if she could mix them, just if they should be taking the other nebulizer medications and ohtuvarye or just ohtuvarye. I specified that she can continue her other nebulizer medications as normal, along side taking ohtuvarye but don't mix them.   NFN.

## 2024-04-06 ENCOUNTER — Other Ambulatory Visit: Payer: Self-pay

## 2024-04-06 ENCOUNTER — Encounter: Payer: Self-pay | Admitting: Emergency Medicine

## 2024-04-06 ENCOUNTER — Ambulatory Visit

## 2024-04-06 ENCOUNTER — Emergency Department
Admission: EM | Admit: 2024-04-06 | Discharge: 2024-04-06 | Disposition: A | Discharge status: Home / Self Care | Attending: Emergency Medicine | Admitting: Emergency Medicine

## 2024-04-06 ENCOUNTER — Ambulatory Visit: Admission: EM | Admit: 2024-04-06 | Discharge: 2024-04-06

## 2024-04-06 DIAGNOSIS — R059 Cough, unspecified: Secondary | ICD-10-CM | POA: Diagnosis present

## 2024-04-06 DIAGNOSIS — I4891 Unspecified atrial fibrillation: Secondary | ICD-10-CM | POA: Diagnosis not present

## 2024-04-06 DIAGNOSIS — J449 Chronic obstructive pulmonary disease, unspecified: Secondary | ICD-10-CM | POA: Insufficient documentation

## 2024-04-06 DIAGNOSIS — R0602 Shortness of breath: Secondary | ICD-10-CM | POA: Diagnosis not present

## 2024-04-06 DIAGNOSIS — I10 Essential (primary) hypertension: Secondary | ICD-10-CM | POA: Insufficient documentation

## 2024-04-06 DIAGNOSIS — E876 Hypokalemia: Secondary | ICD-10-CM

## 2024-04-06 DIAGNOSIS — J441 Chronic obstructive pulmonary disease with (acute) exacerbation: Secondary | ICD-10-CM

## 2024-04-06 DIAGNOSIS — E119 Type 2 diabetes mellitus without complications: Secondary | ICD-10-CM | POA: Insufficient documentation

## 2024-04-06 DIAGNOSIS — I482 Chronic atrial fibrillation, unspecified: Secondary | ICD-10-CM | POA: Insufficient documentation

## 2024-04-06 DIAGNOSIS — J069 Acute upper respiratory infection, unspecified: Secondary | ICD-10-CM | POA: Diagnosis not present

## 2024-04-06 HISTORY — DX: Unspecified atrial fibrillation: I48.91

## 2024-04-06 LAB — CBC WITH DIFFERENTIAL/PLATELET
Abs Immature Granulocytes: 0.04 K/uL (ref 0.00–0.07)
Basophils Absolute: 0 K/uL (ref 0.0–0.1)
Basophils Relative: 0 %
Eosinophils Absolute: 0 K/uL (ref 0.0–0.5)
Eosinophils Relative: 0 %
HCT: 36.8 % (ref 36.0–46.0)
Hemoglobin: 12 g/dL (ref 12.0–15.0)
Immature Granulocytes: 0 %
Lymphocytes Relative: 25 %
Lymphs Abs: 2.3 K/uL (ref 0.7–4.0)
MCH: 27.4 pg (ref 26.0–34.0)
MCHC: 32.6 g/dL (ref 30.0–36.0)
MCV: 84 fL (ref 80.0–100.0)
Monocytes Absolute: 0.9 K/uL (ref 0.1–1.0)
Monocytes Relative: 10 %
Neutro Abs: 5.9 K/uL (ref 1.7–7.7)
Neutrophils Relative %: 65 %
Platelets: 280 K/uL (ref 150–400)
RBC: 4.38 MIL/uL (ref 3.87–5.11)
RDW: 13.9 % (ref 11.5–15.5)
WBC: 9.2 K/uL (ref 4.0–10.5)
nRBC: 0 % (ref 0.0–0.2)

## 2024-04-06 LAB — RESP PANEL BY RT-PCR (RSV, FLU A&B, COVID)  RVPGX2
Influenza A by PCR: NEGATIVE
Influenza B by PCR: NEGATIVE
Resp Syncytial Virus by PCR: NEGATIVE
SARS Coronavirus 2 by RT PCR: NEGATIVE

## 2024-04-06 LAB — URINALYSIS, W/ REFLEX TO CULTURE (INFECTION SUSPECTED)
Bacteria, UA: NONE SEEN
Bilirubin Urine: NEGATIVE
Glucose, UA: >=500 mg/dL — AB
Hgb urine dipstick: NEGATIVE
Ketones, ur: NEGATIVE mg/dL
Leukocytes,Ua: NEGATIVE
Nitrite: NEGATIVE
Protein, ur: 30 mg/dL — AB
Specific Gravity, Urine: 1.023 (ref 1.005–1.030)
pH: 6 (ref 5.0–8.0)

## 2024-04-06 LAB — COMPREHENSIVE METABOLIC PANEL WITH GFR
ALT: 16 U/L (ref 0–44)
AST: 32 U/L (ref 15–41)
Albumin: 4.1 g/dL (ref 3.5–5.0)
Alkaline Phosphatase: 110 U/L (ref 38–126)
Anion gap: 14 (ref 5–15)
BUN: 11 mg/dL (ref 8–23)
CO2: 27 mmol/L (ref 22–32)
Calcium: 9.3 mg/dL (ref 8.9–10.3)
Chloride: 99 mmol/L (ref 98–111)
Creatinine, Ser: 0.96 mg/dL (ref 0.44–1.00)
GFR, Estimated: >60 mL/min
Glucose, Bld: 126 mg/dL — ABNORMAL HIGH (ref 70–99)
Potassium: 2.7 mmol/L — CL (ref 3.5–5.1)
Sodium: 140 mmol/L (ref 135–145)
Total Bilirubin: 0.5 mg/dL (ref 0.0–1.2)
Total Protein: 7.4 g/dL (ref 6.5–8.1)

## 2024-04-06 LAB — LACTIC ACID, PLASMA: Lactic Acid, Venous: 1.8 mmol/L (ref 0.5–1.9)

## 2024-04-06 MED ORDER — POTASSIUM CHLORIDE CRYS ER 20 MEQ PO TBCR
40.0000 meq | EXTENDED_RELEASE_TABLET | Freq: Once | ORAL | Status: AC
  Administered 2024-04-06: 40 meq via ORAL
  Filled 2024-04-06: qty 2

## 2024-04-06 NOTE — ED Notes (Signed)
 Patient is being discharged from the Urgent Care and sent to the Emergency Department via EMS . Per Bernardino Ditch, NP , patient is in need of higher level of care due to Afib w/ rpr . Patient is aware and verbalizes understanding of plan of care.  Vitals:   04/06/24 0816  BP: (!) 154/90  Pulse: 100  Resp: (!) 22  Temp: 99.3 F (37.4 C)  SpO2: 97%

## 2024-04-06 NOTE — Discharge Instructions (Addendum)
 Please take your potassium pill with breakfast and dinner for the next 5 days

## 2024-04-06 NOTE — ED Provider Notes (Signed)
 "  [facility] Provider Note    Event Date/Time   First MD Initiated Contact with Patient 04/06/24 1011     (approximate)   History  Cough    HPI  Elizabeth [name] is a 74 y.o. female with a history of COPD, atrial fibrillation, hypertension, diabetes who presents with complaints of cough, fatigue, nasal congestion over the last couple of weeks.  She uses nighttime oxygen.  Is not having to use oxygen during the day.  Intermittent temperature reported although afebrile here and at urgent care     Physical Exam   Triage Vital Signs: ED Triage Vitals  Encounter Vitals Group     BP 04/06/24 0945 (!) 144/70     Girls Systolic BP Percentile --      Girls Diastolic BP Percentile --      Boys Systolic BP Percentile --      Boys Diastolic BP Percentile --      Pulse Rate 04/06/24 0945 100     Resp 04/06/24 0945 20     Temp 04/06/24 0945 98.4 F (36.9 C)     Temp Source 04/06/24 0945 Oral     SpO2 04/06/24 0944 96 %     Weight 04/06/24 0946 89 kg (196 lb 3.4 oz)     Height --      Head Circumference --      Peak Flow --      Pain Score 04/06/24 1208 0     Pain Loc --      Pain Education --      Exclude from Growth Chart --     Most recent vital signs: Vitals:   04/06/24 0945 04/06/24 1015  BP: (!) 144/70   Pulse: 100   Resp: 20   Temp: 98.4 F (36.9 C)   SpO2: 95% 96%     General: Awake, no distress.  CV:  Good peripheral perfusion.  Irregular rhythm, normal rate Resp:  Normal effort.  Overall clear to auscultation, scattered mild wheezes Abd:    Soft, nontender Other:  No calf pain or swelling   ED Results / Procedures / Treatments   Labs (all labs ordered are listed, but only abnormal results are displayed) Labs Reviewed  COMPREHENSIVE METABOLIC PANEL WITH GFR - Abnormal; Notable for the following components:      Result Value   Potassium 2.7 (*)    Glucose, Bld 126 (*)    All other components within normal limits  URINALYSIS, W/  REFLEX TO CULTURE (INFECTION SUSPECTED) - Abnormal; Notable for the following components:   Color, Urine YELLOW (*)    APPearance CLEAR (*)    Glucose, UA >=500 (*)    Protein, ur 30 (*)    All other components within normal limits  RESP PANEL BY RT-PCR (RSV, FLU A&B, COVID)  [id number]  LACTIC ACID, PLASMA  CBC WITH DIFFERENTIAL/PLATELET  LACTIC ACID, PLASMA     EKG  ED ECG REPORT I, Lamar Price, the attending physician, personally viewed and interpreted this ECG.  Date: 04/06/2024  Rhythm: Atrial fibrillation QRS Axis: normal Intervals: Abnormal ST/T Wave abnormalities: normal Narrative Interpretation: no evidence of acute ischemia    RADIOLOGY Chest x-ray from urgent care negative for pneumonia    PROCEDURES:  Critical Care performed:   Procedures   MEDICATIONS ORDERED IN ED: Medications  potassium chloride  SA (KLOR-CON  M) CR tablet 40 mEq (40 mEq Oral Given 04/06/24 1130)     IMPRESSION / MDM / ASSESSMENT AND  PLAN / ED COURSE  I reviewed the triage vital signs and the nursing notes. Patient's presentation is most consistent with acute presentation with potential threat to life or bodily function.  Patient presents with symptoms as above, initially sent from urgent care because of concerns for atrial fibrillation with RVR.  However here her heart rate is improved  Her symptoms are most consistent with likely viral upper respiratory infection, we will check labs to evaluate for possible bacterial infection  Chest x-ray without evidence of CAP  COVID flu swab negative  White blood cell count and lactic acid are normal.  Patient has notable hypokalemia, likely related to frequent nebulizer use, will give 40 of K-Dur here she is on potassium supplementation at home daily  Overall workup is reassuring, patient is well-appearing not requiring any supplemental oxygen, no tachypnea here, heart rate is normalized.  Hypokalemia noted and will be treated with  increase in p.o. potassium supplementation  No indication for admission at this time, strict return precautions discussed with patient and her daughter, recheck potassium in 1 week with PCP.  They agree with this plan        FINAL CLINICAL IMPRESSION(S) / ED DIAGNOSES   Final diagnoses:  Upper respiratory tract infection, unspecified type  Hypokalemia     Rx / DC Orders   ED Discharge Orders     None        Note:  This document was prepared using Dragon voice recognition software and may include unintentional dictation errors.   Arlander Charleston, MD 04/06/24 1247  "

## 2024-04-06 NOTE — ED Triage Notes (Addendum)
 Arrives via [facility] from [facility] for ED evaluation.  Sick with fever, SOB x 3 weeks.  In Afib RVR at [facility], and same with EMS.  HR 118. Hx Afib.  100.5 RR: 30 97% RA 148/58 CBG: 90 46 ETCO2  Hx sepsis in July.  Has been taking some old antibiotics for 8 days-- Doxycycline , not helping.  CXR done at [facility]

## 2024-04-06 NOTE — Discharge Instructions (Addendum)
 As we discussed, I believe that your shortness of breath is coming from your atrial fibrillation.  It is unclear how long you been in atrial fibrillation given she had symptoms for 3 weeks.  Your chest x-ray did not show any acute infiltrates.  Therefore, we are going to transport you to [facility] for evaluation.

## 2024-04-06 NOTE — ED Provider Notes (Signed)
 " [facility]    CSN: [id number] Arrival date & time: 04/06/24  0803      History   Chief Complaint Chief Complaint  Patient presents with   Cough   Shortness of Breath    HPI Elizabeth [name] is a 74 y.o. female.   HPI  74 year old female with past medical history significant for centrilobular emphysema, COPD, mixed hyperlipidemia, QT prolongation, IDA, type 2 diabetes, left lower lobe lung cancer, OSA on CPAP presents for evaluation of 3 weeks with respiratory symptoms that include shortness of breath, cough, fever, fatigue.  Sleeping with oxygen and using her inhalers.  Has been taking leftover doxycycline  for 8 days without any relief.  Past Medical History:  Diagnosis Date   Allergy    Anxiety    B12 deficiency    Cancer (HCC)    Carpal tunnel syndrome    Centrilobular emphysema (HCC)    COPD (chronic obstructive pulmonary disease) (HCC)    Depression    Genetic testing    Common Cancers panel (47 genes) @ [facility] - No pathogenic mutations detected   HLD (hyperlipidemia)    Hyperlipidemia    Hypertension    Iron  deficiency anemia 10/16/2016   Lipoma of skin    Lung nodule    Normocytic anemia 10/16/2016   QT prolongation    Substance abuse (HCC)     Patient Active Problem List   Diagnosis Date Noted   COPD with acute exacerbation (HCC) 10/18/2023   Weakness generalized 10/09/2023   Type 2 diabetes mellitus with other specified complication (HCC) 10/06/2021   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) 07/04/2021   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) 07/04/2021   OSA on CPAP 06/26/2019   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 03/11/2018   Senile nuclear sclerosis, left 03/11/2018   Substance abuse (HCC)    Genetic testing    Pernicious anemia 11/27/2016   Normocytic anemia 10/16/2016   Iron  deficiency anemia 10/16/2016   Centrilobular emphysema (HCC)    Major depressive disorder, recurrent, moderate  (HCC) 09/24/2016   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   Bilateral carpal tunnel syndrome 05/27/2015   Bilateral hand numbness 05/17/2015   Fracture of humerus, proximal, closed 03/23/2013   Lipoma of skin and subcutaneous tissue 12/26/2011   B12 deficiency 02/15/2011   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      Home Medications    Prior to Admission medications  Medication Sig Start Date End Date Taking? Authorizing Provider  albuterol  (PROVENTIL ) (2.5 MG/3ML) 0.083% nebulizer solution SMARTSIG:1 Vial(s) 4 Times Daily 02/11/24  Yes [provider]  albuterol  (VENTOLIN  HFA) 108 (90 Base) MCG/ACT inhaler INHALE 2 PUFFS BY MOUTH EVERY 6 HOURS AS NEEDED FOR WHEEZING OR SHORTNESS OF BREATH 02/13/24  Yes Karamalegos, Marsa PARAS, DO  apixaban  (ELIQUIS ) 5 MG TABS tablet Take 1 tablet (5 mg total) by mouth 2 (two) times daily. Patient taking differently: Take 5 mg  by mouth daily. 10/23/23  Yes Vivienne Nest M, PA-C  ARIPiprazole  (ABILIFY ) 5 MG tablet Take 1 tablet by mouth once daily 02/13/24  Yes Karamalegos, Alexander J, DO  cyanocobalamin  (VITAMIN B12) 1000 MCG/ML injection INJECT  1ML  INTRAMUSCULARLY ONCE EVERY 30 DAYS 01/23/24  Yes Karamalegos, Marsa PARAS, DO  diltiazem (CARDIZEM CD) 120 MG 24 hr capsule Take 120 mg by mouth. 03/02/24 03/02/25 Yes [provider]  furosemide  (LASIX ) 40 MG tablet Take 40 mg by mouth.  Take 1 tablet (40 mg total) by mouth once daily Patient taking differently: Take 20 mg  by mouth daily.  Take 1 tablet (40 mg total) by mouth once daily 08/28/23  Yes [provider]  JARDIANCE 10 MG TABS tablet Take 10 mg by mouth daily.   Yes [provider]  losartan  (COZAAR ) 50 MG tablet Take 1 tablet by mouth once daily 01/24/24  Yes Karamalegos, Marsa PARAS, DO  MAGNESIUM -OXIDE 400 (240 Mg) MG tablet Take 1 tablet by mouth once daily 02/17/24  Yes Karamalegos, Marsa PARAS, DO  metoprolol  succinate (TOPROL -XL) 25 MG 24 hr tablet Take 1 tablet (25 mg total) by mouth daily. Take with or immediately following a meal. 10/11/23  Yes Karamalegos, Marsa PARAS, DO  Multiple Vitamin (MULTI-VITAMINS) TABS Take by mouth.   Yes [provider]  Nebulizers (PARI PRONEB MAX LC SPRINT) MISC  09/23/23  Yes [provider]  omeprazole  (PRILOSEC) 20 MG capsule TAKE 1 CAPSULE BY MOUTH ONCE DAILY BEFORE BREAKFAST 11/29/23  Yes Karamalegos, Marsa PARAS, DO  potassium chloride  SA (KLOR-CON  M20) 20 MEQ tablet Take 1 tablet by mouth once daily 01/23/24  Yes Karamalegos, Alexander J, DO  RYBELSUS  3 MG TABS Take 1 tablet (3 mg total) by mouth daily before breakfast. 30 min before med or food. For 30 days, then dose will increase. 01/03/24  Yes Karamalegos, Marsa PARAS, DO  SPIRIVA  HANDIHALER 18 MCG CAPS PLACE ONE CAPSULE INTO INHALER AND INHALE ONCE DAILY 02/13/24  Yes Karamalegos, Alexander J, DO  spironolactone (ALDACTONE) 25 MG tablet Take 25 mg by mouth daily.   Yes [provider]  SYMBICORT  160-4.5 MCG/ACT inhaler Inhale 2 puffs into the lungs 2 (two) times daily. 03/23/24  Yes Karamalegos, Marsa PARAS, DO  torsemide (DEMADEX) 20 MG tablet Take 20 mg by mouth 2 (two) times daily.   Yes [provider]  venlafaxine  XR (EFFEXOR -XR) 75 MG 24 hr capsule TAKE 3 CAPSULES BY MOUTH ONCE DAILY WITH BREAKFAST 02/12/24  Yes Karamalegos, Marsa PARAS, DO  busPIRone  (BUSPAR ) 5 MG tablet Take 1 tablet (5 mg total) by mouth 3 (three) times daily. 10/23/23   Burnette,  Jennifer M, PA-C  ipratropium (ATROVENT ) 0.06 % nasal spray Place 2 sprays into both nostrils 4 (four) times daily. 11/05/23   Bernardino Ditch, NP  promethazine -dextromethorphan (PROMETHAZINE -DM) 6.25-15 MG/5ML syrup Take 5 mLs by mouth 4 (four) times daily as needed. Patient not taking: Reported on 03/17/2024 01/21/24   Arvis Jolan NOVAK, PA-C  rosuvastatin  (CRESTOR ) 40 MG tablet Take 40 mg by mouth daily. 08/28/23 08/27/24  [provider]  saccharomyces boulardii (FLORASTOR) 250 MG capsule Take 1 capsule (250 mg total) by mouth 2 (two) times daily. Patient not taking: Reported on 03/17/2024 10/23/23   Burnette, Jennifer M, PA-C  tiotropium (SPIRIVA ) 18 MCG inhalation capsule Place 1 capsule (18 mcg total) into inhaler and inhale daily. 12/11/21 09/17/22  Edman Marsa PARAS, DO    Family History Family History  Problem Relation Age of Onset  Ovarian cancer Mother 41       deceased 49   Breast cancer Mother 70   Stroke Father    Lung cancer Paternal Aunt    Lung cancer Cousin     Social History Social History[1]   Allergies   Moxifloxacin, Levofloxacin, and Amoxicillin -pot clavulanate   Review of Systems Review of Systems  Constitutional:  Positive for fatigue and fever.  HENT:  Positive for congestion and rhinorrhea.   Respiratory:  Positive for cough and shortness of breath.   Cardiovascular:  Positive for palpitations. Negative for chest pain and leg swelling.     Physical Exam Triage Vital Signs ED Triage Vitals  Encounter Vitals Group     BP      Girls Systolic BP Percentile      Girls Diastolic BP Percentile      Boys Systolic BP Percentile      Boys Diastolic BP Percentile      Pulse      Resp      Temp      Temp src      SpO2      Weight      Height      Head Circumference      Peak Flow      Pain Score      Pain Loc      Pain Education      Exclude from Growth Chart    No data found.  Updated Vital Signs BP (!) 154/90 (BP Location: Right  Arm)   Pulse 100   Temp 99.3 F (37.4 C) (Oral)   Resp (!) 22   SpO2 97%   Visual Acuity Right Eye Distance:   Left Eye Distance:   Bilateral Distance:    Right Eye Near:   Left Eye Near:    Bilateral Near:     Physical Exam Vitals and nursing note reviewed.  Constitutional:      Appearance: Normal appearance. She is not ill-appearing.  HENT:     Head: Normocephalic and atraumatic.     Nose: Congestion and rhinorrhea present.     Comments: Nasal mucosa is mildly erythematous without significant edema.  Yellow mucus in both naris. Cardiovascular:     Rate and Rhythm: Tachycardia present. Rhythm irregular.     Pulses: Normal pulses.     Heart sounds: Normal heart sounds. No murmur heard.    No friction rub. No gallop.  Pulmonary:     Effort: Pulmonary effort is normal.     Breath sounds: Normal breath sounds. No wheezing, rhonchi or rales.  Musculoskeletal:     Right lower leg: No edema.     Left lower leg: No edema.  Skin:    General: Skin is warm and dry.     Capillary Refill: Capillary refill takes less than 2 seconds.     Findings: No rash.  Neurological:     General: No focal deficit present.     Mental Status: She is alert and oriented to person, place, and time.      UC Treatments / Results  Labs (all labs ordered are listed, but only abnormal results are displayed) Labs Reviewed - No data to display  EKG Atrial fibrillation with RVR Ventricular rate 180 ms PR interval unmeasurable QRS duration 72 ms QT/QTc 368/515 ms   Radiology DG Chest 2 View Result Date: 04/06/2024 CLINICAL DATA:  Cough and shortness of breath. EXAM: CHEST - 2 VIEW COMPARISON:  01/21/2024 FINDINGS: Cardiopericardial silhouette  is at upper limits of normal for size. Focal peripheral opacity in the left mid lung is similar to prior non underlies rib deformity/fracture. This region was better characterized on recent CT chest of 03/03/2024. Right lung clear. No evidence for pleural  effusion. IMPRESSION: 1. No new acute cardiopulmonary findings. 2. Focal peripheral opacity in the left mid lung is similar to prior and underlies rib deformity/fracture. Electronically Signed   By: Camellia Candle M.D.   On: 04/06/2024 08:44    Procedures Procedures (including critical care time)  Medications Ordered in UC Medications - No data to display  Initial Impression / Assessment and Plan / UC Course  I have reviewed the triage vital signs and the nursing notes.  Pertinent labs & imaging results that were available during my care of the patient were reviewed by me and considered in my medical decision making (see chart for details).   Patient is a pleasant 74 year old female presenting for evaluation of 3 weeks with respiratory symptoms that include fever with a Tmax of 100.4 last night.  The fever typically comes on at night and then resolves during the day.  She has also been experiencing shortness of breath and fatigue.  She will occasionally cough and it is typically only productive in the mornings for thick clear sputum.  She has had mild runny nose nasal congestion and palpitations as well but she denies chest pain.  Her granddaughter contracted flu several weeks ago and she believes that she also contracted the flu.  She is in atrial fibrillation and is taking Eliquis  as well as diltiazem, 120 mg XR.  She reports that she has been taking that medication.  She is also on Lasix  and spironolactone, as well as torsemide.  She denies any leg swelling.  She does have a very protuberant abdomen.  She is able to speak in full sentences without dyspnea or tachypnea.  Heart rate was available at triage so I did have an EKG obtained which shows atrial fibrillation with RVR.  Previous EKG dated 11/22/2023 shows sinus rhythm with PAC's.  EKG from 11/05/2023 shows atrial fibrillation with RVR.  The patient's lungs are clear to auscultation in all fields.  Her heart rate is irregularly irregular as well as  fast.  EKG is currently reading 118 bpm.  I will obtain a chest x-ray to evaluate for any acute cardiopulmonary pathology that the patient reports that she has not been expectorating much sputum and has a very infrequent cough.  I suspect that the shortness of breath and weakness is secondary to her atrial fibrillation with RVR.  She reports that she had been taking 2 of the XR diltiazem but now only takes 1.  She was also prescribed metoprolol  25 mg XL daily.  Chest x-ray  Chest x-ray independently reviewed and evaluated by me.  Impression: Left lower lobe opacity.  Cardiomediastinal silhouette is at upper limits of normal.  Radiology overread is pending. Radiology impression states no new acute cardiopulmonary findings.  Focal peripheral opacity in the left midlung is similar to prior and underlies rib deformity fracture.  Patient had a CT performed on 03/03/2024 that showed interval clearing of prior right lower lobe consolidation as well as progressive opacity along the anterior aspect of the established left lower lobe posttreatment scarring which may reflect evolving posttherapy change.  Given patient's A-fib with RVR and her shortness of breath she is considered unstable and would recommend transfer to the emergency department via EMS.  I will have staff  and start a peripheral IV and call EMS to transfer the patient to [facility].  Report given to [facility], the charge nurse in the ER at [facility].  Report given to [facility].  Care transferred.   Final Clinical Impressions(s) / UC Diagnoses   Final diagnoses:  COPD with acute exacerbation (HCC)  Atrial fibrillation with RVR (HCC)  Shortness of breath     Discharge Instructions      As we discussed, I believe that your shortness of breath is coming from your atrial fibrillation.  It is unclear how long you been in atrial fibrillation given she had symptoms for 3 weeks.  Your chest x-ray did not show any acute infiltrates.  Therefore, we are  going to transport you to [facility] for evaluation.     ED Prescriptions   None    PDMP not reviewed this encounter.     [1]  Social History Tobacco Use   Smoking status: Former    Current packs/day: 0.00    Average packs/day: 0.5 packs/day for 51.9 years (25.9 ttl pk-yrs)    Types: Cigarettes    Start date: 12/02/1971    Quit date: 10/18/2023    Years since quitting: 0.4   Smokeless tobacco: Never   Tobacco comments:    Quit smoking 10/18/2023        Started smoking at 74 years old.    Smoked 1.5PPD at her heaviest  Vaping Use   Vaping status: Some Days  Substance Use Topics   Alcohol use: No   Drug use: No     Bernardino Ditch, NP 04/06/24 [phone]  "

## 2024-04-06 NOTE — ED Triage Notes (Signed)
 Sx x 3 weeks  SOB- hx of COPD Cough Fever Fatigue  Hasn't been seen for this  Sleep with oxygen Uses inhalers  Patient states that she's been taking left over doxy for 8 days with no relief. BID

## 2024-04-07 ENCOUNTER — Other Ambulatory Visit: Payer: Self-pay

## 2024-04-08 NOTE — Patient Outreach (Signed)
 Complex Care Management   Visit Note  04/08/2024  Name:  Elizabeth [name] MRN: [id number] DOB: 1950-12-15  Situation: Referral received for Complex Care Management related to COPD I obtained verbal consent from Patient.  Visit completed with Ms. Jilek  on the phone.  Patient wanted to keep call brief due to not feeling well.  We discussed the 04/06/24 UC to ED visit and she understands the orders and Cardio follow up appointments.   Background:   Past Medical History:  Diagnosis Date   A-fib (HCC)    Allergy    Anxiety    B12 deficiency    Cancer (HCC)    Carpal tunnel syndrome    Centrilobular emphysema (HCC)    COPD (chronic obstructive pulmonary disease) (HCC)    Depression    Genetic testing    Common Cancers panel (47 genes) @ [facility] - No pathogenic mutations detected   HLD (hyperlipidemia)    Hyperlipidemia    Hypertension    Iron  deficiency anemia 10/16/2016   Lipoma of skin    Lung nodule    Normocytic anemia 10/16/2016   QT prolongation    Substance abuse (HCC)     Assessment: Patient Reported Symptoms:  Cognitive Cognitive Status: Alert and oriented to person, place, and time, Insightful and able to interpret abstract concepts, Normal speech and language skills      Neurological Neurological Review of Symptoms: No symptoms reported    HEENT HEENT Symptoms Reported: Not assessed      Cardiovascular Cardiovascular Symptoms Reported: Fatigue Cardiovascular Comment: Went to UC on 1/5 and was sent to ED from there due to EKG showing afib with RVR. Chest xray shows negative for pneumonia, COVID negative, diagnosed with Upper Respiratory Infection, no anitbiotics given. Educated on performing deep breathing exercises, sip fluids during the day to stay hydrated, take inhalers and nebulizers as prescribed.  She has f/u appts with Electrophysiology 2/3 and Cardiology 2/16.  Respiratory Respiratory Symptoms Reported: Shortness of breath Additional Respiratory Details:  SHOB at baseline today. Respiratory Management Strategies: Medication therapy, Breathing exercise, Routine screening  Endocrine Endocrine Symptoms Reported: Not assessed    Gastrointestinal Gastrointestinal Symptoms Reported: Not assessed      Genitourinary Genitourinary Symptoms Reported: Not assessed    Integumentary Integumentary Symptoms Reported: Not assessed    Musculoskeletal Musculoskelatal Symptoms Reviewed: Weakness        Psychosocial Psychosocial Symptoms Reported: Not assessed          04/08/2024    PHQ2-9 Depression Screening   Little interest or pleasure in doing things    Feeling down, depressed, or hopeless    PHQ-2 - Total Score    Trouble falling or staying asleep, or sleeping too much    Feeling tired or having little energy    Poor appetite or overeating     Feeling bad about yourself - or that you are a failure or have let yourself or your family down    Trouble concentrating on things, such as reading the newspaper or watching television    Moving or speaking so slowly that other people could have noticed.  Or the opposite - being so fidgety or restless that you have been moving around a lot more than usual    Thoughts that you would be better off dead, or hurting yourself in some way    PHQ2-9 Total Score    If you checked off any problems, how difficult have these problems made it for you to do your work, take  care of things at home, or get along with other people    Depression Interventions/Treatment      There were no vitals filed for this visit.    MEDICATIONS: Patient is taking extra dose of Klor-con  20meq x 5 days per ED orders due to low K+ of 2.7 on 04/06/24.     Recommendation:  Specialty provider follow-up : Dr. Lamar Lewis/Electrophysiology on 05/05/24; Dr. Callwood/Cardiology 05/18/24 NEEDS one week F/U WITH PCP post ED visit to recheck labs.    Follow Up Plan:   Telephone follow-up in 1 week  Santana Stamp BSN, CCM [place]  [facility]  Population Health RN Care Manager Direct Dial: [phone]  Fax: [phone]

## 2024-04-08 NOTE — Patient Instructions (Signed)
 Visit Information  Thank you for taking time to visit with me today. Please don't hesitate to contact me if I can be of assistance to you before our next scheduled appointment.  Your next care management appointment is by telephone on Tuesday, January 13th at 3:00pm.   Please call the care guide team at [phone] if you need to cancel, schedule, or reschedule an appointment.   Please call the [place]  National Suicide Prevention Lifeline: 762-700-1920 or TTY: 919-408-4557 TTY 5626973209) to talk to a trained counselor if you are experiencing a Mental Health or Behavioral Health Crisis or need someone to talk to.  Santana Stamp BSN, CCM [place]  [employer] Population Health RN Care Manager Direct Dial: [phone]  Fax: [phone]

## 2024-04-10 ENCOUNTER — Ambulatory Visit
Admission: RE | Admit: 2024-04-10 | Discharge: 2024-04-10 | Disposition: A | Discharge status: Home / Self Care | Source: Ambulatory Visit | Attending: Family Medicine | Admitting: Family Medicine

## 2024-04-10 ENCOUNTER — Ambulatory Visit: Admitting: Family Medicine

## 2024-04-10 ENCOUNTER — Ambulatory Visit: Payer: Self-pay | Admitting: Family Medicine

## 2024-04-10 ENCOUNTER — Encounter: Payer: Self-pay | Admitting: Family Medicine

## 2024-04-10 VITALS — BP 142/88 | HR 93 | Temp 100.2°F | Ht 62.0 in | Wt 194.0 lb

## 2024-04-10 DIAGNOSIS — J181 Lobar pneumonia, unspecified organism: Secondary | ICD-10-CM | POA: Diagnosis not present

## 2024-04-10 DIAGNOSIS — D681 Hereditary factor XI deficiency: Secondary | ICD-10-CM

## 2024-04-10 DIAGNOSIS — F419 Anxiety disorder, unspecified: Secondary | ICD-10-CM | POA: Diagnosis not present

## 2024-04-10 DIAGNOSIS — E876 Hypokalemia: Secondary | ICD-10-CM | POA: Diagnosis not present

## 2024-04-10 DIAGNOSIS — J189 Pneumonia, unspecified organism: Secondary | ICD-10-CM | POA: Diagnosis not present

## 2024-04-10 DIAGNOSIS — R509 Fever, unspecified: Secondary | ICD-10-CM | POA: Insufficient documentation

## 2024-04-10 DIAGNOSIS — J029 Acute pharyngitis, unspecified: Secondary | ICD-10-CM | POA: Diagnosis not present

## 2024-04-10 DIAGNOSIS — R9389 Abnormal findings on diagnostic imaging of other specified body structures: Secondary | ICD-10-CM | POA: Diagnosis not present

## 2024-04-10 DIAGNOSIS — C3432 Malignant neoplasm of lower lobe, left bronchus or lung: Secondary | ICD-10-CM | POA: Diagnosis not present

## 2024-04-10 DIAGNOSIS — I4819 Other persistent atrial fibrillation: Secondary | ICD-10-CM | POA: Diagnosis not present

## 2024-04-10 DIAGNOSIS — F331 Major depressive disorder, recurrent, moderate: Secondary | ICD-10-CM | POA: Diagnosis not present

## 2024-04-10 DIAGNOSIS — J989 Respiratory disorder, unspecified: Secondary | ICD-10-CM

## 2024-04-10 LAB — POCT RAPID STREP A (OFFICE): Rapid Strep A Screen: NEGATIVE

## 2024-04-10 MED ORDER — HYDROXYZINE PAMOATE 25 MG PO CAPS: 25.0000 mg | ORAL_CAPSULE | Freq: Three times a day (TID) | ORAL | 0 refills | Status: AC | PRN

## 2024-04-10 MED ORDER — CEFTRIAXONE SODIUM 500 MG IJ SOLR
500.0000 mg | Freq: Once | INTRAMUSCULAR | Status: AC
  Administered 2024-04-10: 500 mg via INTRAMUSCULAR

## 2024-04-10 MED ORDER — CEFDINIR 300 MG PO CAPS: 300.0000 mg | ORAL_CAPSULE | Freq: Two times a day (BID) | ORAL | 0 refills | Status: DC

## 2024-04-10 MED ORDER — AZITHROMYCIN 250 MG PO TABS: ORAL_TABLET | ORAL | 0 refills | Status: DC

## 2024-04-10 MED ORDER — CEFTRIAXONE SODIUM 1 G IJ SOLR: 1.0000 g | Freq: Once | INTRAMUSCULAR | Status: DC

## 2024-04-10 NOTE — Patient Instructions (Addendum)
 Thank you for coming to the office today.  Ceftriaxone  1g injection today  Starting tomorrow Cefdinir  300mg  twice a day for 10 days  Start Azithromycin  Z pak (antibiotic) 2 tabs day 1, then 1 tab x 4 days, complete entire course even if improved  CT Scan today  Labs for potassium  Please schedule a Follow-up Appointment to: Return if symptoms worsen or fail to improve.  If you have any other questions or concerns, please feel free to call the office or send a message through MyChart. You may also schedule an earlier appointment if necessary.  Additionally, you may be receiving a survey about your experience at our office within a few days to 1 week by e-mail or mail. We value your feedback.  Marsa Officer, DO [facility], [place]

## 2024-04-10 NOTE — Progress Notes (Signed)
 "  Subjective:    Patient ID: Elizabeth [name], female    DOB: Nov 23, 1950, 74 y.o.   MRN: [id number]  Elizabeth [name] is a 74 y.o. female presenting on 04/10/2024 for Hospitalization Follow-up and Sore Throat (Fever for a couple weeks )   HPI  Discussed the use of AI scribe software for clinical note transcription with the patient, who gave verbal consent to proceed.  History of Present Illness   Elizabeth [name] is a 74 year old female with COPD who presents with fever and respiratory symptoms.  Suspected Community Acquired Pneumonia Question persistent abnormality LLL Fever and constitutional symptoms - Fever for three weeks, with temperatures up to 102F at night - Minimal relief with Tylenol  - General malaise, weakness, and exhaustion - Significant fatigue - Increased thirst, especially during febrile episodes - No significant weight loss despite poor oral intake  Severe COPD Followed by Pulm - History of COPD complicating current symptoms - Shortness of breath similar to prior episode of pneumonia and sepsis 10/2023 - Sore throat with blister vs ulceration - No mention of chest pain, hemoptysis, or productive cough  Imaging findings - Chest CT on March 03, 2024, showed interval clearing of prior right lower lobe infection - Chest x-ray on April 06, 2024, showed improvement in right lower lung but persistent opacity in left lower lung - Concern that current symptoms may be related to persistent left lower lung opacity  Antibiotic therapy and response - Currently taking doxycycline  twice daily (prior expired rx that is not helping) - Cefdinir  previously effective for similar symptoms - Feels current antibiotics are ineffective  Hypokalemia Electrolyte abnormalities ED visit with low K 2.7 - Currently taking potassium supplements for previously documented hypokalemia during recent hospitalization  Musculoskeletal history - History of rib fracture, possibly  contributing to imaging findings  Infectious disease history - Significant episode of sepsis in July 2025 following pneumonia      Lung Cancer, LLL Stage 1a Followed by [facility], s/p SBRT      04/10/2024   11:30 PM 01/29/2024   11:39 AM 12/06/2023   12:38 PM  Depression screen PHQ 2/9  Decreased Interest 0 0 0  Down, Depressed, Hopeless 1 0 1  PHQ - 2 Score 1 0 1  Altered sleeping   1  Tired, decreased energy 2  1  Feeling bad or failure about yourself  0  0  Trouble concentrating   0  Moving slowly or fidgety/restless 0  0  Suicidal thoughts 0  0  PHQ-9 Score   3   Difficult doing work/chores Somewhat difficult  Not difficult at all     Data saved with a previous flowsheet row definition       12/06/2023   12:41 PM 11/29/2023   11:56 AM 11/25/2023    2:39 PM 11/15/2023    2:28 PM  GAD 7 : Generalized Anxiety Score  Nervous, Anxious, on Edge 1 0 0 0  Control/stop worrying 0 0 0 1  Worry too much - different things 0 0 0 1  Trouble relaxing 0 0 0 0  Restless 0 0 0 0  Easily annoyed or irritable 0 0 0 0  Afraid - awful might happen 1 0 0 0  Total GAD 7 Score 2 0 0 2  Anxiety Difficulty Not difficult at all Not difficult at all Not difficult at all Not difficult at all    Social History[1]  Review of Systems Per HPI unless specifically indicated above  Objective:    BP (!) 142/88 (BP Location: Right Arm, Patient Position: Sitting, Cuff Size: Normal)   Pulse 93   Temp 100.2 F (37.9 C) (Oral)   Ht 5' 2 (1.575 m)   Wt 194 lb (88 kg)   SpO2 94%   BMI 35.48 kg/m   Wt Readings from Last 3 Encounters:  04/10/24 194 lb (88 kg)  04/06/24 196 lb 3.4 oz (89 kg)  03/17/24 196 lb 9.6 oz (89.2 kg)    Physical Exam Vitals and nursing note reviewed.  Constitutional:      General: She is not in acute distress.    Appearance: She is well-developed. She is not diaphoretic.     Comments: Well-appearing, comfortable, cooperative  HENT:     Head: Normocephalic  and atraumatic.     Right Ear: Ear canal and external ear normal.     Left Ear: Tympanic membrane, ear canal and external ear normal.     Ears:     Comments: TM effusion    Nose: No congestion.     Mouth/Throat:     Pharynx: Posterior oropharyngeal erythema present. No oropharyngeal exudate.  Eyes:     General:        Right eye: No discharge.        Left eye: No discharge.     Conjunctiva/sclera: Conjunctivae normal.  Neck:     Thyroid : No thyromegaly.  Cardiovascular:     Rate and Rhythm: Normal rate and regular rhythm.     Heart sounds: Normal heart sounds. No murmur heard. Pulmonary:     Effort: Pulmonary effort is normal. No respiratory distress.     Breath sounds: No wheezing or rales.     Comments: Reduced air movement diffuse near baseline. No focal lung abnormality Musculoskeletal:        General: Normal range of motion.     Cervical back: Normal range of motion and neck supple.  Lymphadenopathy:     Cervical: No cervical adenopathy.  Skin:    General: Skin is warm and dry.     Findings: No erythema or rash.  Neurological:     Mental Status: She is alert and oriented to person, place, and time.  Psychiatric:        Behavior: Behavior normal.     Comments: Well groomed, good eye contact, normal speech and thoughts     NEW RESULTS I have personally reviewed the radiology report from 04/10/24 on CT Chest.  EXAM: CT CHEST WITHOUT CONTRAST 04/10/2024 12:40:05 PM   TECHNIQUE: CT of the chest was performed without the administration of intravenous contrast. Multiplanar reformatted images are provided for review. Automated exposure control, iterative reconstruction, and/or weight based adjustment of the mA/kV was utilized to reduce the radiation dose to as low as reasonably achievable.   COMPARISON: 03/03/2024   CLINICAL HISTORY: Pneumonia, complication suspected, xray done; interval left lower lobe opacity follow-up, fever symptoms of pneumonia.   FINDINGS:    MEDIASTINUM: Calcified atherosclerosis in the LAD. Heart and pericardium are otherwise unremarkable. The central airways are clear.   LYMPH NODES: No mediastinal, hilar or axillary lymphadenopathy.   LUNGS AND PLEURA: Similar small region of consolidation in the anterobasal left lower lobe. , likely scarring Tree in bud nodularity in the right lower lobe. Unchanged 3 mm right upper lobe nodule (axial 38). 2 mm subpleural nodule in the superior segment of the right lower lobe (axial 43). 2 mm lateral nodule in the superior segment of the right lower  lobe (axial 48). Minimal centrilobular emphysema. 3 mm nodule in the right lung apex (axial 21). 3 mm nodule in the subpleural left lower lobe (axial 111). No pleural effusion or pneumothorax.   SOFT TISSUES/BONES: No acute abnormality of the bones or soft tissues. Left-sided rib fractures are again noted. Osteopenia. Multilevel degenerative disc disease of the spine.   UPPER ABDOMEN: Cholecystolithiasis. No changes of acute cholecystitis.   IMPRESSION: 1. Tree-in-bud nodularity in the right lower lobe, likely representing an infectious or inflammatory bronchiolitis or changes of aspiration. No lobar pneumonia or pleural effusion. 2. Similar small region of consolidation in the anterobasal left lower lobe, likely representing scarring. 3. Scattered 2 to 3 mm nodules again noted throughout both lungs, unchanged. Attention on routine oncologic follow-up recommended. 4. Cholecystolithiasis.   Electronically signed by: Rogelia Myers MD MD 04/10/2024 02:32 PM EST RP Workstation: [id number]   PREVIOUS RESULTS I have personally reviewed the radiology report from CT Chest 03/03/24.  EXAM: CT CHEST WITH CONTRAST 03/03/2024 12:00:40 PM   TECHNIQUE: CT of the chest was performed with the administration of 75 mL of iohexol  (OMNIPAQUE ) 300 MG/ML solution. Multiplanar reformatted images are provided for review. Automated exposure  control, iterative reconstruction, and/or weight based adjustment of the mA/kV was utilized to reduce the radiation dose to as low as reasonably achievable.   COMPARISON: None available.   CLINICAL HISTORY: worsening shortness of breath, lung cancer   FINDINGS:   MEDIASTINUM: Heart size is normal. No substantial pericardial effusion. Coronary artery atherosclerotic calcification evident. Mild atherosclerotic calcification is noted in the wall of the thoracic aorta. The central airways are clear.   LYMPH NODES: No mediastinal, hilar or axillary lymphadenopathy.   LUNGS AND PLEURA: Centrilobular emphysema. Interval clearing of the right lower lobe consolidative disease seen on the previous exam. Post-treatment changes in the peripheral aspect of the anterior left lower lobe are similar although opacity is progressive along the anterior aspect of the scarring (compare image 69 of series 3 today to image 83 of series 5 previously). Tiny nodules in the right upper lung seen in the paraspinal region on 38/3 and posterior to the major fissure on 29/3 are similar to prior. No focal consolidation or pulmonary edema. No pleural effusion or pneumothorax.   SOFT TISSUES/BONES: Left-sided rib fractures evident. No worrisome lytic or sclerotic osseous abnormality. No acute abnormality of the soft tissues.   UPPER ABDOMEN: Limited images of the upper abdomen demonstrate tiny calcified gallstones. Nodular thickening of both adrenal glands is stable in the interval.   IMPRESSION: 1. No definite evidence of new or progressive metastatic disease within the chest. Interval clearing of prior right lower lobe consolidation supports resolving infection or inflammation. 2. Progressive opacity along the anterior aspect of established left lower lobe post-treatment scarring, which may reflect evolving post-therapy change; short-interval chest CT follow-up is recommended to ensure stability.    Electronically signed by: Camellia Candle MD 03/07/2024 10:52 AM EST RP Workstation: [id number]  ----  I have personally reviewed the radiology report from 04/06/24 on CXR.   CLINICAL DATA:  Cough and shortness of breath.   EXAM: CHEST - 2 VIEW   COMPARISON:  01/21/2024   FINDINGS: Cardiopericardial silhouette is at upper limits of normal for size. Focal peripheral opacity in the left mid lung is similar to prior non underlies rib deformity/fracture. This region was better characterized on recent CT chest of 03/03/2024. Right lung clear. No evidence for pleural effusion.   IMPRESSION: 1. No new acute cardiopulmonary findings. 2.  Focal peripheral opacity in the left mid lung is similar to prior and underlies rib deformity/fracture.     Electronically Signed   By: Camellia Candle M.D.   On: 04/06/2024 08:44  Results for orders placed or performed in visit on 04/10/24  POCT rapid strep A   Collection Time: 04/10/24 10:23 AM  Result Value Ref Range   Rapid Strep A Screen Negative Negative  CBC with Differential/Platelet   Collection Time: 04/10/24 11:40 AM  Result Value Ref Range   WBC 8.4 3.8 - 10.8 Thousand/uL   RBC 4.16 3.80 - 5.10 Million/uL   Hemoglobin 11.4 (L) 11.7 - 15.5 g/dL   HCT 64.5 (L) 64.0 - 53.9 %   MCV 85.1 81.4 - 101.7 fL   MCH 27.4 27.0 - 33.0 pg   MCHC 32.2 31.6 - 35.4 g/dL   RDW 85.9 88.9 - 84.9 %   Platelets 297 140 - 400 Thousand/uL   MPV 9.4 7.5 - 12.5 fL   Neutro Abs 5,578 1,500 - 7,800 cells/uL   Absolute Lymphocytes 2,050 850 - 3,900 cells/uL   Absolute Monocytes 756 200 - 950 cells/uL   Eosinophils Absolute 8 (L) 15 - 500 cells/uL   Basophils Absolute 8 0 - 200 cells/uL   Neutrophils Relative % 66.4 %   Total Lymphocyte 24.4 %   Monocytes Relative 9.0 %   Eosinophils Relative 0.1 %   Basophils Relative 0.1 %      Assessment & Plan:   Problem List Items Addressed This Visit     Congenital factor XI deficiency (HCC)   Major depressive  disorder, recurrent, moderate (HCC)   Relevant Medications   hydrOXYzine  (VISTARIL ) 25 MG capsule   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)   Relevant Medications   cefdinir  (OMNICEF ) 300 MG capsule   azithromycin  (ZITHROMAX  Z-PAK) 250 MG tablet   Morbid obesity (HCC)   Persistent atrial fibrillation (HCC)   Other Visit Diagnoses       Community acquired pneumonia of left lower lobe of lung    -  Primary   Relevant Medications   cefdinir  (OMNICEF ) 300 MG capsule   azithromycin  (ZITHROMAX  Z-PAK) 250 MG tablet   cefTRIAXone  (ROCEPHIN ) injection 500 mg (Completed)   cefTRIAXone  (ROCEPHIN ) injection 500 mg (Completed)   Other Relevant Orders   CBC with Differential/Platelet (Completed)   CT CHEST WO CONTRAST (Completed)     Respiratory illness with fever       Relevant Orders   CT CHEST WO CONTRAST (Completed)     Sore throat       Relevant Orders   POCT rapid strep A (Completed)     Hypokalemia       Relevant Orders   Basic metabolic panel with GFR     Consolidation of left lower lobe of lung       Relevant Orders   CT CHEST WO CONTRAST (Completed)     Abnormal CT of the chest       Relevant Orders   CT CHEST WO CONTRAST (Completed)     Anxiety       Relevant Medications   hydrOXYzine  (VISTARIL ) 25 MG capsule       Community acquired pneumonia of Right lower lobe Persistent fever and symptoms with unresolved left lower lobe opacity. Differential includes resolving infection, scarring, or new infection.  Concerns about potential sepsis due to history and symptoms. Current antibiotics ineffective. - Ordered chest CT to evaluate for pneumonia - results abnormal with  RLL infectious or inflammatory etiology, there is question of aspiration concern and patient / daughter voiced this same concern as well with choking aspiration issues.  - Administered ceftriaxone  injection. 500mg  x 2 = 1g IM - Prescribed cefdinir  300 mg twice daily for 10 days. (This has been  successful in past for pneumonia and sepsis history) - Prescribed azithromycin  (Z-Pak) for 5 days. (Discussed precaution with Prolonged QT risk, other medication impact as well, however limited options given Fluoroquinolone intolerance and Augmentin  intolerance. Already tried Doxycycline .  We agreed to pursue 5 day course on Cefdinir  first to see status and improvement, if not improving as expected, may contact office for permission to start Azithromycin  Zpak at that time. We discussed QT prolong potential risks.  Chronic obstructive pulmonary disease, severe Followed by Pulmonology Severe COPD with stage 4 classification. Risk of exacerbation due to infection and fever. History of respiratory arrest and sepsis related to COPD exacerbation. 10/2023 - Continue to monitor respiratory status closely. Continue baseline therapy with maintenance inhalers  Hypokalemia ED visit recently w K 2.7 Low potassium levels noted. Currently taking potassium supplements. - Ordered blood test to recheck potassium levels.  Anxiety disorder Major Depression recurrent moderate Increased anxiety related to health concerns and fear of sepsis recurrence. Discussed palliative care for symptom supportive management but declines today - Discussed palliative care options for symptom management. - Consider hydroxyzine  for anxiety management.  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    Dispense:  20 capsule    Refill:  0   azithromycin  (ZITHROMAX  Z-PAK) 250 MG tablet    Sig: Take 2 tabs (500mg  total) on Day 1. Take 1 tab (250mg ) daily for next 4 days.    Dispense:  6 tablet    Refill:  0   DISCONTD: cefTRIAXone  (ROCEPHIN ) injection 1 g   hydrOXYzine  (VISTARIL ) 25 MG capsule    Sig: Take 1 capsule (25 mg total) by mouth every 8 (eight) hours as needed.    Dispense:  90 capsule    Refill:  0   cefTRIAXone  (ROCEPHIN ) injection 500 mg   cefTRIAXone  (ROCEPHIN ) injection 500 mg    Follow up plan: Return if symptoms worsen or fail to improve.  Marsa Officer, DO [facility] 04/10/2024, 10:26 AM     [1]  Social History Tobacco Use   Smoking status: Former    Current packs/day: 0.00    Average packs/day: 0.5 packs/day for 51.9 years (25.9 ttl pk-yrs)    Types: Cigarettes    Start date: 12/02/1971    Quit date: 10/18/2023    Years since quitting: 0.4   Smokeless tobacco: Never   Tobacco comments:    Quit smoking 10/18/2023  Started smoking at 74 years old.    Smoked 1.5PPD at her heaviest  Vaping Use   Vaping status: Some Days  Substance Use Topics   Alcohol use: No   Drug use: No   "

## 2024-04-11 LAB — CBC WITH DIFFERENTIAL/PLATELET
Absolute Lymphocytes: 2050 {cells}/uL (ref 850–3900)
Absolute Monocytes: 756 {cells}/uL (ref 200–950)
Basophils Absolute: 8 {cells}/uL (ref 0–200)
Basophils Relative: 0.1 %
Eosinophils Absolute: 8 {cells}/uL — ABNORMAL LOW (ref 15–500)
Eosinophils Relative: 0.1 %
HCT: 35.4 % — ABNORMAL LOW (ref 35.9–46.0)
Hemoglobin: 11.4 g/dL — ABNORMAL LOW (ref 11.7–15.5)
MCH: 27.4 pg (ref 27.0–33.0)
MCHC: 32.2 g/dL (ref 31.6–35.4)
MCV: 85.1 fL (ref 81.4–101.7)
MPV: 9.4 fL (ref 7.5–12.5)
Monocytes Relative: 9 %
Neutro Abs: 5578 {cells}/uL (ref 1500–7800)
Neutrophils Relative %: 66.4 %
Platelets: 297 Thousand/uL (ref 140–400)
RBC: 4.16 Million/uL (ref 3.80–5.10)
RDW: 14 % (ref 11.0–15.0)
Total Lymphocyte: 24.4 %
WBC: 8.4 Thousand/uL (ref 3.8–10.8)

## 2024-04-11 LAB — BASIC METABOLIC PANEL WITH GFR
BUN: 11 mg/dL (ref 7–25)
CO2: 30 mmol/L (ref 20–32)
Calcium: 8.6 mg/dL (ref 8.6–10.4)
Chloride: 98 mmol/L (ref 98–110)
Creat: 0.99 mg/dL (ref 0.60–1.00)
Glucose, Bld: 117 mg/dL — ABNORMAL HIGH (ref 65–99)
Potassium: 3 mmol/L — ABNORMAL LOW (ref 3.5–5.3)
Sodium: 140 mmol/L (ref 135–146)
eGFR: 60 mL/min/1.73m2

## 2024-04-13 ENCOUNTER — Inpatient Hospital Stay: Admission: EM | Admit: 2024-04-13 | Discharge: 2024-04-16 | DRG: 194 | Disposition: A | Discharge status: Home Health

## 2024-04-13 ENCOUNTER — Emergency Department

## 2024-04-13 ENCOUNTER — Other Ambulatory Visit: Payer: Self-pay

## 2024-04-13 DIAGNOSIS — E1169 Type 2 diabetes mellitus with other specified complication: Secondary | ICD-10-CM | POA: Diagnosis present

## 2024-04-13 DIAGNOSIS — F331 Major depressive disorder, recurrent, moderate: Secondary | ICD-10-CM | POA: Diagnosis present

## 2024-04-13 DIAGNOSIS — I5032 Chronic diastolic (congestive) heart failure: Secondary | ICD-10-CM | POA: Diagnosis present

## 2024-04-13 DIAGNOSIS — R531 Weakness: Secondary | ICD-10-CM

## 2024-04-13 DIAGNOSIS — E538 Deficiency of other specified B group vitamins: Secondary | ICD-10-CM | POA: Diagnosis present

## 2024-04-13 DIAGNOSIS — E782 Mixed hyperlipidemia: Secondary | ICD-10-CM | POA: Diagnosis present

## 2024-04-13 DIAGNOSIS — J9611 Chronic respiratory failure with hypoxia: Secondary | ICD-10-CM | POA: Diagnosis present

## 2024-04-13 DIAGNOSIS — Z8616 Personal history of COVID-19: Secondary | ICD-10-CM

## 2024-04-13 DIAGNOSIS — J189 Pneumonia, unspecified organism: Secondary | ICD-10-CM | POA: Diagnosis present

## 2024-04-13 DIAGNOSIS — J432 Centrilobular emphysema: Secondary | ICD-10-CM | POA: Diagnosis present

## 2024-04-13 DIAGNOSIS — I4819 Other persistent atrial fibrillation: Secondary | ICD-10-CM | POA: Diagnosis present

## 2024-04-13 DIAGNOSIS — Z90710 Acquired absence of both cervix and uterus: Secondary | ICD-10-CM

## 2024-04-13 DIAGNOSIS — F411 Generalized anxiety disorder: Secondary | ICD-10-CM | POA: Diagnosis present

## 2024-04-13 DIAGNOSIS — Z881 Allergy status to other antibiotic agents status: Secondary | ICD-10-CM

## 2024-04-13 DIAGNOSIS — E876 Hypokalemia: Principal | ICD-10-CM | POA: Diagnosis present

## 2024-04-13 DIAGNOSIS — G4733 Obstructive sleep apnea (adult) (pediatric): Secondary | ICD-10-CM | POA: Diagnosis present

## 2024-04-13 DIAGNOSIS — J219 Acute bronchiolitis, unspecified: Secondary | ICD-10-CM | POA: Diagnosis present

## 2024-04-13 DIAGNOSIS — J440 Chronic obstructive pulmonary disease with acute lower respiratory infection: Secondary | ICD-10-CM | POA: Diagnosis present

## 2024-04-13 DIAGNOSIS — Z8041 Family history of malignant neoplasm of ovary: Secondary | ICD-10-CM

## 2024-04-13 DIAGNOSIS — Z823 Family history of stroke: Secondary | ICD-10-CM

## 2024-04-13 DIAGNOSIS — F17290 Nicotine dependence, other tobacco product, uncomplicated: Secondary | ICD-10-CM | POA: Diagnosis present

## 2024-04-13 DIAGNOSIS — I110 Hypertensive heart disease with heart failure: Secondary | ICD-10-CM | POA: Diagnosis present

## 2024-04-13 DIAGNOSIS — Z79899 Other long term (current) drug therapy: Secondary | ICD-10-CM

## 2024-04-13 DIAGNOSIS — Z6841 Body Mass Index (BMI) 40.0 and over, adult: Secondary | ICD-10-CM | POA: Diagnosis not present

## 2024-04-13 DIAGNOSIS — K219 Gastro-esophageal reflux disease without esophagitis: Secondary | ICD-10-CM | POA: Diagnosis present

## 2024-04-13 DIAGNOSIS — N179 Acute kidney failure, unspecified: Secondary | ICD-10-CM | POA: Diagnosis not present

## 2024-04-13 DIAGNOSIS — Z803 Family history of malignant neoplasm of breast: Secondary | ICD-10-CM

## 2024-04-13 DIAGNOSIS — D681 Hereditary factor XI deficiency: Secondary | ICD-10-CM | POA: Diagnosis present

## 2024-04-13 DIAGNOSIS — Z9981 Dependence on supplemental oxygen: Secondary | ICD-10-CM

## 2024-04-13 DIAGNOSIS — B80 Enterobiasis: Secondary | ICD-10-CM | POA: Diagnosis present

## 2024-04-13 DIAGNOSIS — D509 Iron deficiency anemia, unspecified: Secondary | ICD-10-CM | POA: Diagnosis present

## 2024-04-13 DIAGNOSIS — Z923 Personal history of irradiation: Secondary | ICD-10-CM

## 2024-04-13 DIAGNOSIS — Z8673 Personal history of transient ischemic attack (TIA), and cerebral infarction without residual deficits: Secondary | ICD-10-CM

## 2024-04-13 DIAGNOSIS — Z7984 Long term (current) use of oral hypoglycemic drugs: Secondary | ICD-10-CM

## 2024-04-13 DIAGNOSIS — R509 Fever, unspecified: Secondary | ICD-10-CM

## 2024-04-13 DIAGNOSIS — Z85118 Personal history of other malignant neoplasm of bronchus and lung: Secondary | ICD-10-CM

## 2024-04-13 DIAGNOSIS — Z801 Family history of malignant neoplasm of trachea, bronchus and lung: Secondary | ICD-10-CM

## 2024-04-13 DIAGNOSIS — Z7901 Long term (current) use of anticoagulants: Secondary | ICD-10-CM | POA: Diagnosis not present

## 2024-04-13 DIAGNOSIS — Z880 Allergy status to penicillin: Secondary | ICD-10-CM

## 2024-04-13 DIAGNOSIS — I10 Essential (primary) hypertension: Secondary | ICD-10-CM | POA: Diagnosis present

## 2024-04-13 DIAGNOSIS — R0602 Shortness of breath: Secondary | ICD-10-CM | POA: Diagnosis present

## 2024-04-13 DIAGNOSIS — Z7951 Long term (current) use of inhaled steroids: Secondary | ICD-10-CM

## 2024-04-13 LAB — RESP PANEL BY RT-PCR (RSV, FLU A&B, COVID)  RVPGX2
Influenza A by PCR: NEGATIVE
Influenza B by PCR: NEGATIVE
Resp Syncytial Virus by PCR: NEGATIVE
SARS Coronavirus 2 by RT PCR: NEGATIVE

## 2024-04-13 LAB — URINALYSIS, COMPLETE (UACMP) WITH MICROSCOPIC
Bacteria, UA: NONE SEEN
Bilirubin Urine: NEGATIVE
Glucose, UA: >=500 mg/dL — AB
Hgb urine dipstick: NEGATIVE
Ketones, ur: NEGATIVE mg/dL
Leukocytes,Ua: NEGATIVE
Nitrite: NEGATIVE
Protein, ur: 30 mg/dL — AB
Specific Gravity, Urine: 1.023 (ref 1.005–1.030)
pH: 5 (ref 5.0–8.0)

## 2024-04-13 LAB — BASIC METABOLIC PANEL WITH GFR
Anion gap: 13 (ref 5–15)
BUN: 10 mg/dL (ref 8–23)
CO2: 28 mmol/L (ref 22–32)
Calcium: 9 mg/dL (ref 8.9–10.3)
Chloride: 95 mmol/L — ABNORMAL LOW (ref 98–111)
Creatinine, Ser: 1.08 mg/dL — ABNORMAL HIGH (ref 0.44–1.00)
GFR, Estimated: 54 mL/min — ABNORMAL LOW
Glucose, Bld: 142 mg/dL — ABNORMAL HIGH (ref 70–99)
Potassium: 2.5 mmol/L — CL (ref 3.5–5.1)
Sodium: 135 mmol/L (ref 135–145)

## 2024-04-13 LAB — CBC
HCT: 34.7 % — ABNORMAL LOW (ref 36.0–46.0)
Hemoglobin: 11.5 g/dL — ABNORMAL LOW (ref 12.0–15.0)
MCH: 27.5 pg (ref 26.0–34.0)
MCHC: 33.1 g/dL (ref 30.0–36.0)
MCV: 83 fL (ref 80.0–100.0)
Platelets: 295 K/uL (ref 150–400)
RBC: 4.18 MIL/uL (ref 3.87–5.11)
RDW: 14 % (ref 11.5–15.5)
WBC: 8.2 K/uL (ref 4.0–10.5)
nRBC: 0 % (ref 0.0–0.2)

## 2024-04-13 LAB — TROPONIN T, HIGH SENSITIVITY
Troponin T High Sensitivity: 23 ng/L — ABNORMAL HIGH (ref 0–19)
Troponin T High Sensitivity: 24 ng/L — ABNORMAL HIGH (ref 0–19)

## 2024-04-13 LAB — PROTIME-INR
INR: 1 (ref 0.8–1.2)
Prothrombin Time: 13.9 s (ref 11.4–15.2)

## 2024-04-13 LAB — MAGNESIUM: Magnesium: 2 mg/dL (ref 1.7–2.4)

## 2024-04-13 MED ORDER — ACETAMINOPHEN 325 MG PO TABS
650.0000 mg | ORAL_TABLET | Freq: Four times a day (QID) | ORAL | Status: DC | PRN
  Administered 2024-04-14 – 2024-04-15 (×2): 650 mg via ORAL
  Filled 2024-04-13 (×2): qty 2

## 2024-04-13 MED ORDER — SODIUM CHLORIDE 0.9 % IV SOLN
100.0000 mg | Freq: Two times a day (BID) | INTRAVENOUS | Status: DC
  Administered 2024-04-14 (×2): 100 mg via INTRAVENOUS
  Filled 2024-04-13 (×5): qty 100

## 2024-04-13 MED ORDER — SODIUM CHLORIDE 0.9 % IV SOLN
2.0000 g | INTRAVENOUS | Status: DC
  Administered 2024-04-14: 2 g via INTRAVENOUS
  Filled 2024-04-13 (×2): qty 20

## 2024-04-13 MED ORDER — SODIUM CHLORIDE 0.9 % IV SOLN
100.0000 mg | Freq: Two times a day (BID) | INTRAVENOUS | Status: DC
  Administered 2024-04-13: 100 mg via INTRAVENOUS
  Filled 2024-04-13 (×3): qty 100

## 2024-04-13 MED ORDER — MAGNESIUM SULFATE 2 GM/50ML IV SOLN
2.0000 g | Freq: Once | INTRAVENOUS | Status: AC
  Administered 2024-04-13: 2 g via INTRAVENOUS
  Filled 2024-04-13: qty 50

## 2024-04-13 MED ORDER — POTASSIUM CHLORIDE 10 MEQ/100ML IV SOLN
10.0000 meq | INTRAVENOUS | Status: AC
  Administered 2024-04-13 (×3): 10 meq via INTRAVENOUS
  Filled 2024-04-13 (×3): qty 100

## 2024-04-13 MED ORDER — SODIUM CHLORIDE 0.9 % IV SOLN
1.0000 g | Freq: Once | INTRAVENOUS | Status: AC
  Administered 2024-04-13: 1 g via INTRAVENOUS
  Filled 2024-04-13: qty 10

## 2024-04-13 MED ORDER — ACETAMINOPHEN 500 MG PO TABS
1000.0000 mg | ORAL_TABLET | Freq: Once | ORAL | Status: AC
  Administered 2024-04-13: 1000 mg via ORAL
  Filled 2024-04-13: qty 2

## 2024-04-13 MED ORDER — SENNOSIDES-DOCUSATE SODIUM 8.6-50 MG PO TABS
1.0000 | ORAL_TABLET | Freq: Every evening | ORAL | Status: DC | PRN
  Administered 2024-04-15: 1 via ORAL
  Filled 2024-04-13: qty 1

## 2024-04-13 MED ORDER — ACETAMINOPHEN 650 MG RE SUPP: 650.0000 mg | Freq: Four times a day (QID) | RECTAL | Status: DC | PRN

## 2024-04-13 MED ORDER — SODIUM CHLORIDE 0.9% FLUSH
3.0000 mL | Freq: Two times a day (BID) | INTRAVENOUS | Status: DC
  Administered 2024-04-13 – 2024-04-15 (×4): 3 mL via INTRAVENOUS

## 2024-04-13 NOTE — H&P (Addendum)
 " History and Physical    Elizabeth [name] FMW:[id number] DOB: 03-21-1951 DOA: 04/13/2024  DOS: the patient was seen and examined on 04/13/2024  PCP: Edman Marsa PARAS, DO   Patient coming from: Home  I have personally briefly reviewed patient's old medical records in [facility] and CareEverywhere  HPI:   Elizabeth [name] is a 74 y.o. year old female with medical history of hypertension, hyperlipidemia, type 2 diabetes, atrial fibrillation presented to the ED with generalized weakness along with worsening cough, shortness of breath and recurrent fever despite being on oral antibiotics for pneumonia.  She was evaluated by her PCP on 01/09 and given oral antibiotics including cefdinir  and azithromycin  which was later discontinued as there was determined her QTc was prolonged. On arrival to the ED patient was noted to be HDS stable.  Lab work and imaging obtained.  CBC without leukocytosis, mild anemia near baseline.  Anemia is normocytic and very mild.  BMP with severe hypokalemia 2.5, slight creatinine elevation.  Troponin mildly elevated and flat.  UA without signs infection.  Chest x-ray showing pneumonia.  Blood cultures obtained patient started on antibiotics.  Given need for continued care, [facility] contacted for admission.  Review of Systems: As mentioned in the history of present illness. All other systems reviewed and are negative.   Past Medical History:  Diagnosis Date   A-fib [facility])    Allergy    Anxiety    B12 deficiency    Cancer (HCC)    Carpal tunnel syndrome    Centrilobular emphysema (HCC)    COPD (chronic obstructive pulmonary disease) (HCC)    Depression    Genetic testing    Common Cancers panel (47 genes) @ [facility] - No pathogenic mutations detected   HLD (hyperlipidemia)    Hyperlipidemia    Hypertension    Iron  deficiency anemia 10/16/2016   Lipoma of skin    Lung nodule    Normocytic anemia 10/16/2016   QT prolongation    Substance abuse (HCC)      Past Surgical History:  Procedure Laterality Date   ABDOMINAL HYSTERECTOMY     BREAST EXCISIONAL BIOPSY Left    1980's neg   CESAREAN SECTION     TUBAL LIGATION       Allergies[1]  Family History  Problem Relation Age of Onset   Ovarian cancer Mother 23       deceased 4   Breast cancer Mother 61   Stroke Father    Lung cancer Paternal Aunt    Lung cancer Cousin     Prior to Admission medications  Medication Sig Start Date End Date Taking? Authorizing Provider  albuterol  (PROVENTIL ) (2.5 MG/3ML) 0.083% nebulizer solution SMARTSIG:1 Vial(s) 4 Times Daily 02/11/24  Yes [provider]  albuterol  (VENTOLIN  HFA) 108 (90 Base) MCG/ACT inhaler INHALE 2 PUFFS BY MOUTH EVERY 6 HOURS AS NEEDED FOR WHEEZING OR SHORTNESS OF BREATH 02/13/24  Yes Karamalegos, Marsa PARAS, DO  apixaban  (ELIQUIS ) 5 MG TABS tablet Take 1 tablet (5 mg total) by mouth 2 (two) times daily. Patient taking differently: Take 5 mg by mouth daily. 10/23/23  Yes Vivienne Nest M, PA-C  ARIPiprazole  (ABILIFY ) 5 MG tablet Take 1 tablet by mouth once daily 02/13/24  Yes Karamalegos, Marsa PARAS, DO  busPIRone  (BUSPAR ) 5 MG tablet Take 1 tablet (5 mg total) by mouth 3 (three) times daily. 10/23/23  Yes Vivienne Nest HERO, PA-C  cefdinir  (OMNICEF ) 300 MG capsule Take 1 capsule (300 mg total) by mouth 2 (  two) times daily. 04/10/24  Yes Karamalegos, Marsa PARAS, DO  cyanocobalamin  (VITAMIN B12) 1000 MCG/ML injection INJECT  1ML  INTRAMUSCULARLY ONCE EVERY 30 DAYS 01/23/24  Yes Karamalegos, Marsa PARAS, DO  diltiazem  (CARDIZEM  CD) 120 MG 24 hr capsule Take 120 mg by mouth. 03/02/24 03/02/25 Yes [provider]  hydrOXYzine  (VISTARIL ) 25 MG capsule Take 1 capsule (25 mg total) by mouth every 8 (eight) hours as needed. 04/10/24  Yes Karamalegos, Marsa PARAS, DO  ipratropium (ATROVENT ) 0.06 % nasal spray Place 2 sprays into both nostrils 4 (four) times daily. 11/05/23  Yes Bernardino Ditch, NP  JARDIANCE 10 MG TABS  tablet Take 10 mg by mouth daily.   Yes [provider]  losartan  (COZAAR ) 50 MG tablet Take 1 tablet by mouth once daily 01/24/24  Yes Karamalegos, Marsa PARAS, DO  MAGNESIUM -OXIDE 400 (240 Mg) MG tablet Take 1 tablet by mouth once daily 02/17/24  Yes Karamalegos, Marsa PARAS, DO  metoprolol  succinate (TOPROL -XL) 25 MG 24 hr tablet Take 1 tablet (25 mg total) by mouth daily. Take with or immediately following a meal. 10/11/23  Yes Karamalegos, Marsa PARAS, DO  Multiple Vitamin (MULTI-VITAMINS) TABS Take by mouth.   Yes [provider]  omeprazole  (PRILOSEC) 20 MG capsule TAKE 1 CAPSULE BY MOUTH ONCE DAILY BEFORE BREAKFAST 11/29/23  Yes Karamalegos, Marsa PARAS, DO  potassium chloride  SA (KLOR-CON  M20) 20 MEQ tablet Take 1 tablet by mouth once daily 01/23/24  Yes Karamalegos, Marsa PARAS, DO  rosuvastatin  (CRESTOR ) 40 MG tablet Take 40 mg by mouth daily. 08/28/23 08/27/24 Yes [provider]  RYBELSUS  3 MG TABS Take 1 tablet (3 mg total) by mouth daily before breakfast. 30 min before med or food. For 30 days, then dose will increase. 01/03/24  Yes Karamalegos, Marsa PARAS, DO  SPIRIVA  HANDIHALER 18 MCG CAPS PLACE ONE CAPSULE INTO INHALER AND INHALE ONCE DAILY 02/13/24  Yes Karamalegos, Alexander J, DO  spironolactone (ALDACTONE) 25 MG tablet Take 25 mg by mouth daily.   Yes [provider]  SYMBICORT  160-4.5 MCG/ACT inhaler Inhale 2 puffs into the lungs 2 (two) times daily. 03/23/24  Yes Karamalegos, Marsa PARAS, DO  torsemide  (DEMADEX ) 20 MG tablet Take 20 mg by mouth 2 (two) times daily.   Yes [provider]  venlafaxine  XR (EFFEXOR -XR) 75 MG 24 hr capsule TAKE 3 CAPSULES BY MOUTH ONCE DAILY WITH BREAKFAST 02/12/24  Yes Karamalegos, Marsa PARAS, DO  azithromycin  (ZITHROMAX  Z-PAK) 250 MG tablet Take 2 tabs (500mg  total) on Day 1. Take 1 tab (250mg ) daily for next 4 days. Patient not taking: Reported on 04/13/2024 04/10/24   Edman Marsa PARAS, DO   furosemide  (LASIX ) 40 MG tablet Take 40 mg by mouth.  Take 1 tablet (40 mg total) by mouth once daily Patient not taking: Reported on 04/13/2024 08/28/23   [provider]  Nebulizers (PARI PRONEB MAX LC SPRINT) MISC  09/23/23   [provider]  promethazine -dextromethorphan (PROMETHAZINE -DM) 6.25-15 MG/5ML syrup Take 5 mLs by mouth 4 (four) times daily as needed. Patient not taking: Reported on 01/29/2024 01/21/24   Arvis Jolan NOVAK, PA-C  saccharomyces boulardii (FLORASTOR) 250 MG capsule Take 1 capsule (250 mg total) by mouth 2 (two) times daily. Patient not taking: Reported on 01/29/2024 10/23/23   Burnette, Jennifer M, PA-C  tiotropium (SPIRIVA ) 18 MCG inhalation capsule Place 1 capsule (18 mcg total) into inhaler and inhale daily. 12/11/21 09/17/22  Edman Marsa PARAS, DO    Social History:  reports that she quit  smoking about 5 months ago. Her smoking use included cigarettes. She started smoking about 52 years ago. She has a 25.9 pack-year smoking history. She has never used smokeless tobacco. She reports that she does not drink alcohol and does not use drugs. Independent ADLs and IADLs Lives with daughter.  Does not smoke or drink   Physical Exam: Vitals:   04/13/24 2015 04/13/24 2027 04/13/24 2030 04/13/24 2120  BP: (!) 150/56  (!) 140/47   Pulse: 61  (!) 56   Resp: (!) 27  (!) 27   Temp:    98.9 F (37.2 C)  TempSrc:    Oral  SpO2: 94%  93%   Weight:  91.3 kg    Height:  5' 2 (1.575 m)      Gen: NAD HENT: NCAT CV: normal heart sounds Lung: CTAB Abd: No TTP, normal bowel sounds MSK: No asymmetry, good bulk and tone Neuro: alert and oriented   Labs on Admission: I have personally reviewed following labs and imaging studies  CBC: Recent Labs  Lab 04/10/24 1140 04/13/24 1601  WBC 8.4 8.2  NEUTROABS 5,578  --   HGB 11.4* 11.5*  HCT 35.4* 34.7*  MCV 85.1 83.0  PLT 297 295   Basic Metabolic Panel: Recent Labs  Lab 04/10/24 1140  04/13/24 1601 04/13/24 2028  NA 140 135  --   K 3.0* 2.5*  --   CL 98 95*  --   CO2 30 28  --   GLUCOSE 117* 142*  --   BUN 11 10  --   CREATININE 0.99 1.08*  --   CALCIUM  8.6 9.0  --   MG  --   --  2.0   GFR: Estimated Creatinine Clearance: 48 mL/min (A) (by C-G formula based on SCr of 1.08 mg/dL (H)). Liver Function Tests: No results for input(s): AST, ALT, ALKPHOS, BILITOT, PROT, ALBUMIN in the last 168 hours. No results for input(s): LIPASE, AMYLASE in the last 168 hours. No results for input(s): AMMONIA in the last 168 hours. Coagulation Profile: Recent Labs  Lab 04/13/24 1601  INR 1.0   Cardiac Enzymes: No results for input(s): CKTOTAL, CKMB, CKMBINDEX, TROPONINI, TROPONINIHS in the last 168 hours. BNP (last 3 results) Recent Labs    10/18/23 0600  BNP 329.7*   HbA1C: No results for input(s): HGBA1C in the last 72 hours. CBG: No results for input(s): GLUCAP in the last 168 hours. Lipid Profile: No results for input(s): CHOL, HDL, LDLCALC, TRIG, CHOLHDL, LDLDIRECT in the last 72 hours. Thyroid  Function Tests: No results for input(s): TSH, T4TOTAL, FREET4, T3FREE, THYROIDAB in the last 72 hours. Anemia Panel: No results for input(s): VITAMINB12, FOLATE, FERRITIN, TIBC, IRON , RETICCTPCT in the last 72 hours. Urine analysis:    Component Value Date/Time   COLORURINE YELLOW (A) 04/13/2024 2028   APPEARANCEUR CLEAR (A) 04/13/2024 2028   APPEARANCEUR Hazy 05/06/2014 1226   LABSPEC 1.023 04/13/2024 2028   LABSPEC 1.024 05/06/2014 1226   PHURINE 5.0 04/13/2024 2028   GLUCOSEU >=500 (A) 04/13/2024 2028   GLUCOSEU Negative 05/06/2014 1226   HGBUR NEGATIVE 04/13/2024 2028   BILIRUBINUR NEGATIVE 04/13/2024 2028   BILIRUBINUR Negative 05/06/2014 1226   KETONESUR NEGATIVE 04/13/2024 2028   PROTEINUR 30 (A) 04/13/2024 2028   NITRITE NEGATIVE 04/13/2024 2028   LEUKOCYTESUR NEGATIVE 04/13/2024 2028    LEUKOCYTESUR 2+ 05/06/2014 1226    Radiological Exams on Admission: I have personally reviewed images DG Chest 2 View Result Date: 04/13/2024 CLINICAL DATA:  Shortness  of breath EXAM: CHEST - 2 VIEW COMPARISON:  Chest x-ray 04/06/2024, CT 04/10/2024, 03/03/2024, 10/09/2023 FINDINGS: No new consolidation, pleural effusion or pneumothorax. Stable vague left midlung opacity. No pneumothorax IMPRESSION: Stable vague left midlung opacity, corresponding to mild airspace opacity on prior chest CT referenc chest CT 03/03/2024. No new or progressive airspace disease compared to the recent priors Electronically Signed   By: Luke Bun M.D.   On: 04/13/2024 17:30    EKG: My personal interpretation of EKG shows: Atrial fibrillation without any acute ST changes   Assessment/Plan Principal Problem:   Community acquired pneumonia Active Problems:   Benign essential hypertension   Mixed hyperlipidemia   QT prolongation   Tobacco use disorder   OSA on CPAP   Type 2 diabetes mellitus with other specified complication (HCC)   Persistent atrial fibrillation (HCC)   PNA Pt presented with exertional shortness of breath, fevers and image finding showing an opacity admitted for IV therapy as she has failed outpatient oral antibiotics.  Recent CT scan showed tree-in-bud nodularity in the right lower lobe representing infectious or inflammatory bronchiolitis. -Continue ceftriaxone  and doxycycline  -Monitor fever curve, trend WBC -Follow up blood cultures  -Wean oxygen as able -Continuous pluse ox  Severe hypokalemia: Reports poor oral intake in setting of her recent illness.  Given IV potassium in the ED along with magnesium .  Magnesium  is normal and will give oral potassium 60 mill equivalents.  Replete as needed for goal of greater than 4.  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  Biopsy was not performed.  Per oncology she will need repeat CT scan on July 2026 to check for recurrent  disease.  Scan in July 2025 did not show any recurrent disease.  She completed treatment in May 2021.  Discussed with patient she will need close follow-up for this.  Chronic Problems: Restart home meds once med reconciliation is completed.  HTN: continue home meds HLD: continue home meds T2DM: hold home meds, start on SSI and titrate COPD/Asthma: continue home inhalers MDD/GAD: continue home meds Paroxysmal atrial fibrillation: Continue home regimen.  VTE prophylaxis:  Eliquis   Diet: HH Code Status:  Full Code Telemetry:  Admission status: Inpatient, Telemetry bed Patient is from: Home Anticipated d/c is to: Home Anticipated d/c is in: 2-3 days   Family Communication: Updated at bedside  Consults called: None   Severity of Illness: The appropriate patient status for this patient is INPATIENT. Inpatient status is judged to be reasonable and necessary in order to provide the required intensity of service to ensure the patient's safety. The patient's presenting symptoms, physical exam findings, and initial radiographic and laboratory data in the context of their chronic comorbidities is felt to place them at high risk for further clinical deterioration. Furthermore, it is not anticipated that the patient will be medically stable for discharge from the hospital within 2 midnights of admission.   * I certify that at the point of admission it is my clinical judgment that the patient will require inpatient hospital care spanning beyond 2 midnights from the point of admission due to high intensity of service, high risk for further deterioration and high frequency of surveillance required.DEWAINE Morene Bathe, MD [place] DEL. [facility]     [1]  Allergies Allergen Reactions   Moxifloxacin Itching, Other (See Comments), Photosensitivity, Rash and Swelling    Causes swelling, redness, burning in eyes    Levofloxacin Other (See Comments)    Other reaction(s): Muscle  Pain  Other  reaction(s): Muscle Pain Other reaction(s): Muscle Pain   Amoxicillin -Pot Clavulanate Nausea Only and Other (See Comments)    Other Reaction: DIARRHEA.  Other reaction(s): Other (See Comments)  Other Reaction: DIARRHEA. Other reaction(s): Other (See Comments) Other Reaction: DIARRHEA. Other Reaction: DIARRHEA.  Other reaction(s): Other (See Comments) Other Reaction: DIARRHEA.    Other Reaction: DIARRHEA. Other Reaction: DIARRHEA.  Other reaction(s): Other (See Comments) Other Reaction: DIARRHEA.   "

## 2024-04-13 NOTE — ED Provider Notes (Signed)
 " [place] EMERGENCY DEPARTMENT AT [facility] Provider Note   CSN: [id number] Arrival date & time: 04/13/24  1545     Patient presents with: Shortness of Breath   Elizabeth [name] is a 74 y.o. female.  With history of atrial fibrillation on Eliquis , severe COPD, diabetes and obstructive sleep apnea presents to the emergency department for evaluation of weakness, continued fevers and cough.  Patient has been sick for several weeks with a cough.  CT scan showed lung opacity concerning for infectious disease.  She was started on IM ceftriaxone  and placed on oral cefdinir  and azithromycin  but did not start the azithromycin  due to pharmacist recommendations.  Patient states she has had low-grade temps every day over the last few days, has had shortness of breath with ambulation.  Patient states she typically can walk to her bathroom but now she can barely make it to the bedside commode.  She is on oxygen 2 L at nighttime at baseline.  Oxygen saturations have been a little lower than normal.  She denies any productive cough or wheezing.  No swelling in her lower extremities, no chest pain, nausea or vomiting.      Prior to Admission medications  Medication Sig Start Date End Date Taking? Authorizing Provider  albuterol  (PROVENTIL ) (2.5 MG/3ML) 0.083% nebulizer solution SMARTSIG:1 Vial(s) 4 Times Daily 02/11/24   [provider]  albuterol  (VENTOLIN  HFA) 108 (90 Base) MCG/ACT inhaler INHALE 2 PUFFS BY MOUTH EVERY 6 HOURS AS NEEDED FOR WHEEZING OR SHORTNESS OF BREATH 02/13/24   Karamalegos, Marsa PARAS, DO  apixaban  (ELIQUIS ) 5 MG TABS tablet Take 1 tablet (5 mg total) by mouth 2 (two) times daily. Patient taking differently: Take 5 mg by mouth daily. 10/23/23   Burnette, Jennifer M, PA-C  ARIPiprazole  (ABILIFY ) 5 MG tablet Take 1 tablet by mouth once daily 02/13/24   Karamalegos, Marsa PARAS, DO  azithromycin  (ZITHROMAX  Z-PAK) 250 MG tablet Take 2 tabs (500mg  total) on Day 1. Take 1 tab  (250mg ) daily for next 4 days. 04/10/24   Karamalegos, Marsa PARAS, DO  busPIRone  (BUSPAR ) 5 MG tablet Take 1 tablet (5 mg total) by mouth 3 (three) times daily. 10/23/23   Vivienne Delon HERO, PA-C  cefdinir  (OMNICEF ) 300 MG capsule Take 1 capsule (300 mg total) by mouth 2 (two) times daily. 04/10/24   Karamalegos, Alexander J, DO  cyanocobalamin  (VITAMIN B12) 1000 MCG/ML injection INJECT  1ML  INTRAMUSCULARLY ONCE EVERY 30 DAYS 01/23/24   Edman, Marsa PARAS, DO  diltiazem  (CARDIZEM  CD) 120 MG 24 hr capsule Take 120 mg by mouth. 03/02/24 03/02/25  [provider]  furosemide  (LASIX ) 40 MG tablet Take 40 mg by mouth.  Take 1 tablet (40 mg total) by mouth once daily Patient not taking: Reported on 04/10/2024 08/28/23   [provider]  hydrOXYzine  (VISTARIL ) 25 MG capsule Take 1 capsule (25 mg total) by mouth every 8 (eight) hours as needed. 04/10/24   Karamalegos, Alexander J, DO  ipratropium (ATROVENT ) 0.06 % nasal spray Place 2 sprays into both nostrils 4 (four) times daily. 11/05/23   Bernardino Ditch, NP  JARDIANCE 10 MG TABS tablet Take 10 mg by mouth daily.    [provider]  losartan  (COZAAR ) 50 MG tablet Take 1 tablet by mouth once daily 01/24/24   Edman, Marsa PARAS, DO  MAGNESIUM -OXIDE 400 (240 Mg) MG tablet Take 1 tablet by mouth once daily 02/17/24   Edman, Marsa PARAS, DO  metoprolol  succinate (TOPROL -XL) 25 MG 24 hr tablet  Take 1 tablet (25 mg total) by mouth daily. Take with or immediately following a meal. 10/11/23   Karamalegos, Marsa PARAS, DO  Multiple Vitamin (MULTI-VITAMINS) TABS Take by mouth.    [provider]  Nebulizers (PARI PRONEB MAX LC SPRINT) MISC  09/23/23   [provider]  omeprazole  (PRILOSEC) 20 MG capsule TAKE 1 CAPSULE BY MOUTH ONCE DAILY BEFORE BREAKFAST 11/29/23   Karamalegos, Marsa PARAS, DO  potassium chloride  SA (KLOR-CON  M20) 20 MEQ tablet Take 1 tablet by mouth once daily 01/23/24   Edman Marsa PARAS, DO   promethazine -dextromethorphan (PROMETHAZINE -DM) 6.25-15 MG/5ML syrup Take 5 mLs by mouth 4 (four) times daily as needed. Patient not taking: Reported on 04/10/2024 01/21/24   Arvis Jolan NOVAK, PA-C  rosuvastatin  (CRESTOR ) 40 MG tablet Take 40 mg by mouth daily. 08/28/23 08/27/24  [provider]  RYBELSUS  3 MG TABS Take 1 tablet (3 mg total) by mouth daily before breakfast. 30 min before med or food. For 30 days, then dose will increase. 01/03/24   Karamalegos, Marsa PARAS, DO  saccharomyces boulardii (FLORASTOR) 250 MG capsule Take 1 capsule (250 mg total) by mouth 2 (two) times daily. Patient not taking: Reported on 04/10/2024 10/23/23   Vivienne Delon HERO, PA-C  SPIRIVA  HANDIHALER 18 MCG CAPS PLACE ONE CAPSULE INTO INHALER AND INHALE ONCE DAILY 02/13/24   Edman Marsa PARAS, DO  spironolactone (ALDACTONE) 25 MG tablet Take 25 mg by mouth daily.    [provider]  SYMBICORT  160-4.5 MCG/ACT inhaler Inhale 2 puffs into the lungs 2 (two) times daily. 03/23/24   Karamalegos, Marsa PARAS, DO  torsemide  (DEMADEX ) 20 MG tablet Take 20 mg by mouth 2 (two) times daily.    [provider]  venlafaxine  XR (EFFEXOR -XR) 75 MG 24 hr capsule TAKE 3 CAPSULES BY MOUTH ONCE DAILY WITH BREAKFAST 02/12/24   Karamalegos, Marsa PARAS, DO  tiotropium (SPIRIVA ) 18 MCG inhalation capsule Place 1 capsule (18 mcg total) into inhaler and inhale daily. 12/11/21 09/17/22  Karamalegos, Marsa PARAS, DO    Allergies: Moxifloxacin, Levofloxacin, and Amoxicillin -pot clavulanate    Review of Systems  Updated Vital Signs BP (!) 152/81   Pulse 70   Temp (!) 100.7 F (38.2 C) (Oral)   Resp 20   Ht 5' 2 (1.575 m)   Wt 87.5 kg   SpO2 95%   BMI 35.30 kg/m   Physical Exam Constitutional:      Appearance: She is well-developed.  HENT:     Head: Normocephalic and atraumatic.  Eyes:     Conjunctiva/sclera: Conjunctivae normal.  Cardiovascular:     Rate and Rhythm: Normal rate.     Pulses:  Normal pulses.     Heart sounds: Normal heart sounds.  Pulmonary:     Effort: Pulmonary effort is normal. No respiratory distress.     Comments: No respiratory distress on exam.  She has subtle decreased air movement bilaterally but no rales or crackles on auscultation. Abdominal:     General: Abdomen is flat. Bowel sounds are normal. There is no distension.     Tenderness: There is no abdominal tenderness. There is no right CVA tenderness, left CVA tenderness or guarding.  Musculoskeletal:        General: Normal range of motion.     Cervical back: Normal range of motion.  Skin:    General: Skin is warm.     Findings: No rash.  Neurological:     General: No focal deficit present.  Mental Status: She is alert and oriented to person, place, and time.  Psychiatric:        Mood and Affect: Mood normal.        Behavior: Behavior normal.        Thought Content: Thought content normal.     (all labs ordered are listed, but only abnormal results are displayed) Labs Reviewed  BASIC METABOLIC PANEL WITH GFR - Abnormal; Notable for the following components:      Result Value   Potassium 2.5 (*)    Chloride 95 (*)    Glucose, Bld 142 (*)    Creatinine, Ser 1.08 (*)    GFR, Estimated 54 (*)    All other components within normal limits  CBC - Abnormal; Notable for the following components:   Hemoglobin 11.5 (*)    HCT 34.7 (*)    All other components within normal limits  TROPONIN T, HIGH SENSITIVITY - Abnormal; Notable for the following components:   Troponin T High Sensitivity 23 (*)    All other components within normal limits  PROTIME-INR  MAGNESIUM   URINALYSIS, COMPLETE (UACMP) WITH MICROSCOPIC  TROPONIN T, HIGH SENSITIVITY    EKG: None  Radiology: DG Chest 2 View Result Date: 04/13/2024 CLINICAL DATA:  Shortness of breath EXAM: CHEST - 2 VIEW COMPARISON:  Chest x-ray 04/06/2024, CT 04/10/2024, 03/03/2024, 10/09/2023 FINDINGS: No new consolidation, pleural effusion or  pneumothorax. Stable vague left midlung opacity. No pneumothorax IMPRESSION: Stable vague left midlung opacity, corresponding to mild airspace opacity on prior chest CT referenc chest CT 03/03/2024. No new or progressive airspace disease compared to the recent priors Electronically Signed   By: Luke Bun M.D.   On: 04/13/2024 17:30     Procedures   Medications Ordered in the ED  acetaminophen  (TYLENOL ) tablet 1,000 mg (has no administration in time range)  cefTRIAXone  (ROCEPHIN ) 1 g in sodium chloride  0.9 % 100 mL IVPB (has no administration in time range)  doxycycline  (VIBRAMYCIN ) 100 mg in sodium chloride  0.9 % 250 mL IVPB (has no administration in time range)  potassium chloride  10 mEq in 100 mL IVPB (has no administration in time range)                                    Medical Decision Making Amount and/or Complexity of Data Reviewed Labs: ordered. Radiology: ordered.  Risk OTC drugs. Prescription drug management.   74 year old female with concern for pneumonia.  Chest x-ray showing a stable left midlung opacity.  She has had some weakness continued cough and low-grade temps.  She has been treated with cefdinir  as well as azithromycin  but did not take the azithromycin .  She is noted to be hypokalemic with a potassium of 2.5. We will start her on IV Rocephin  as well as IV potassium and magnesium .  Magnesium  labs are pending along with urinalysis.  She previously had viral panel ordered and negative for any flu COVID or RSV..  Due to pneumonia, hypokalemia and weakness, will discuss admission with hospitalist.  Hospitalist agreeable to admission.  Final diagnoses:  Hypokalemia  Pneumonia of left lung due to infectious organism, unspecified part of lung  Weakness  Fever, unspecified fever cause    ED Discharge Orders     None          Charlene Debby JAYSON DEVONNA 04/13/24 1858    Waymond Lorelle Cummins, MD 04/13/24 1928  "

## 2024-04-13 NOTE — ED Triage Notes (Addendum)
 Pt to triage via wheelchair.  Pt has pneumonia and sob.  Pt states she had a ct scan last week.  Sx worse.  Hx afib.  Pt alert.

## 2024-04-14 ENCOUNTER — Other Ambulatory Visit (HOSPITAL_COMMUNITY): Payer: Self-pay

## 2024-04-14 ENCOUNTER — Telehealth (HOSPITAL_COMMUNITY): Payer: Self-pay | Admitting: Pharmacy Technician

## 2024-04-14 ENCOUNTER — Other Ambulatory Visit: Payer: Self-pay

## 2024-04-14 DIAGNOSIS — J189 Pneumonia, unspecified organism: Secondary | ICD-10-CM | POA: Diagnosis not present

## 2024-04-14 LAB — RESPIRATORY PANEL BY PCR

## 2024-04-14 LAB — CBC
HCT: 33 % — ABNORMAL LOW (ref 36.0–46.0)
Hemoglobin: 10.6 g/dL — ABNORMAL LOW (ref 12.0–15.0)
MCH: 27.2 pg (ref 26.0–34.0)
MCHC: 32.1 g/dL (ref 30.0–36.0)
MCV: 84.6 fL (ref 80.0–100.0)
Platelets: 282 K/uL (ref 150–400)
RBC: 3.9 MIL/uL (ref 3.87–5.11)
RDW: 14.3 % (ref 11.5–15.5)
WBC: 6.9 K/uL (ref 4.0–10.5)
nRBC: 0 % (ref 0.0–0.2)

## 2024-04-14 LAB — CBG MONITORING, ED
Glucose-Capillary: 175 mg/dL — ABNORMAL HIGH (ref 70–99)
Glucose-Capillary: 155 mg/dL — ABNORMAL HIGH (ref 70–99)

## 2024-04-14 LAB — BASIC METABOLIC PANEL WITH GFR
Anion gap: 10 (ref 5–15)
BUN: 10 mg/dL (ref 8–23)
CO2: 26 mmol/L (ref 22–32)
Calcium: 8.2 mg/dL — ABNORMAL LOW (ref 8.9–10.3)
Chloride: 102 mmol/L (ref 98–111)
Creatinine, Ser: 0.97 mg/dL (ref 0.44–1.00)
GFR, Estimated: >60 mL/min
Glucose, Bld: 119 mg/dL — ABNORMAL HIGH (ref 70–99)
Potassium: 3.1 mmol/L — ABNORMAL LOW (ref 3.5–5.1)
Sodium: 138 mmol/L (ref 135–145)

## 2024-04-14 LAB — PROCALCITONIN: Procalcitonin: 0.2 ng/mL

## 2024-04-14 MED ORDER — HYDROXYZINE PAMOATE 25 MG PO CAPS: 25.0000 mg | ORAL_CAPSULE | Freq: Three times a day (TID) | ORAL | Status: DC | PRN

## 2024-04-14 MED ORDER — ARIPIPRAZOLE 5 MG PO TABS
5.0000 mg | ORAL_TABLET | Freq: Every day | ORAL | Status: DC
  Administered 2024-04-14 – 2024-04-16 (×3): 5 mg via ORAL
  Filled 2024-04-14 (×3): qty 1

## 2024-04-14 MED ORDER — HYDROXYZINE HCL 25 MG PO TABS
25.0000 mg | ORAL_TABLET | Freq: Three times a day (TID) | ORAL | Status: DC | PRN
  Administered 2024-04-15: 25 mg via ORAL
  Filled 2024-04-14: qty 1

## 2024-04-14 MED ORDER — BUSPIRONE HCL 10 MG PO TABS
5.0000 mg | ORAL_TABLET | Freq: Three times a day (TID) | ORAL | Status: DC
  Administered 2024-04-14 – 2024-04-16 (×5): 5 mg via ORAL
  Filled 2024-04-14 (×6): qty 1

## 2024-04-14 MED ORDER — DILTIAZEM HCL ER COATED BEADS 120 MG PO CP24
120.0000 mg | ORAL_CAPSULE | Freq: Every day | ORAL | Status: DC
  Administered 2024-04-14 – 2024-04-16 (×3): 120 mg via ORAL
  Filled 2024-04-14 (×3): qty 1

## 2024-04-14 MED ORDER — METOPROLOL SUCCINATE ER 25 MG PO TB24
25.0000 mg | ORAL_TABLET | Freq: Every day | ORAL | Status: DC
  Administered 2024-04-14 – 2024-04-16 (×3): 25 mg via ORAL
  Filled 2024-04-14 (×3): qty 1

## 2024-04-14 MED ORDER — POTASSIUM CHLORIDE 20 MEQ PO PACK
20.0000 meq | PACK | Freq: Two times a day (BID) | ORAL | Status: AC
  Administered 2024-04-14 (×2): 20 meq via ORAL
  Filled 2024-04-14 (×2): qty 1

## 2024-04-14 MED ORDER — INSULIN ASPART 100 UNIT/ML IJ SOLN
0.0000 [IU] | Freq: Every day | INTRAMUSCULAR | Status: DC
  Administered 2024-04-15: 2 [IU] via SUBCUTANEOUS
  Filled 2024-04-14: qty 2

## 2024-04-14 MED ORDER — INSULIN ASPART 100 UNIT/ML IJ SOLN
0.0000 [IU] | Freq: Three times a day (TID) | INTRAMUSCULAR | Status: DC
  Administered 2024-04-14 – 2024-04-15 (×2): 2 [IU] via SUBCUTANEOUS
  Administered 2024-04-15: 1 [IU] via SUBCUTANEOUS
  Administered 2024-04-15: 2 [IU] via SUBCUTANEOUS
  Administered 2024-04-16: 3 [IU] via SUBCUTANEOUS
  Filled 2024-04-14: qty 2
  Filled 2024-04-14: qty 3
  Filled 2024-04-14: qty 1
  Filled 2024-04-14 (×2): qty 2

## 2024-04-14 MED ORDER — TORSEMIDE 20 MG PO TABS
20.0000 mg | ORAL_TABLET | Freq: Two times a day (BID) | ORAL | Status: DC
  Administered 2024-04-14 – 2024-04-15 (×3): 20 mg via ORAL
  Filled 2024-04-14 (×3): qty 1

## 2024-04-14 MED ORDER — APIXABAN 5 MG PO TABS
5.0000 mg | ORAL_TABLET | Freq: Two times a day (BID) | ORAL | Status: DC
  Administered 2024-04-14 – 2024-04-16 (×5): 5 mg via ORAL
  Filled 2024-04-14 (×5): qty 1

## 2024-04-14 MED ORDER — ALBUTEROL SULFATE (2.5 MG/3ML) 0.083% IN NEBU: 2.5000 mg | INHALATION_SOLUTION | RESPIRATORY_TRACT | Status: DC | PRN

## 2024-04-14 MED ORDER — IPRATROPIUM BROMIDE 0.06 % NA SOLN
2.0000 | Freq: Four times a day (QID) | NASAL | Status: DC
  Administered 2024-04-14 – 2024-04-16 (×7): 2 via NASAL
  Filled 2024-04-14 (×2): qty 15

## 2024-04-14 MED ORDER — PANTOPRAZOLE SODIUM 40 MG PO TBEC
40.0000 mg | DELAYED_RELEASE_TABLET | Freq: Every day | ORAL | Status: DC
  Administered 2024-04-14 – 2024-04-16 (×3): 40 mg via ORAL
  Filled 2024-04-14 (×3): qty 1

## 2024-04-14 MED ORDER — VENLAFAXINE HCL ER 75 MG PO CP24
75.0000 mg | ORAL_CAPSULE | Freq: Every day | ORAL | Status: DC
  Administered 2024-04-14 – 2024-04-16 (×3): 75 mg via ORAL
  Filled 2024-04-14 (×3): qty 1

## 2024-04-14 MED ORDER — ROSUVASTATIN CALCIUM 20 MG PO TABS
40.0000 mg | ORAL_TABLET | Freq: Every day | ORAL | Status: DC
  Administered 2024-04-14 – 2024-04-16 (×3): 40 mg via ORAL
  Filled 2024-04-14 (×3): qty 2

## 2024-04-14 MED ORDER — FLUTICASONE FUROATE-VILANTEROL 100-25 MCG/ACT IN AEPB
1.0000 | INHALATION_SPRAY | Freq: Every day | RESPIRATORY_TRACT | Status: DC
  Filled 2024-04-14: qty 28

## 2024-04-14 MED ORDER — ALBUTEROL SULFATE HFA 108 (90 BASE) MCG/ACT IN AERS: 1.0000 | INHALATION_SPRAY | RESPIRATORY_TRACT | Status: DC | PRN

## 2024-04-14 NOTE — Telephone Encounter (Signed)
 Patient Product/process development scientist completed.    The patient is insured through [employer]. Patient has Medicare and is not eligible for a copay card, but may be able to apply for patient assistance or Medicare RX Payment Plan (Patient Must reach out to their plan, if eligible for payment plan), if available.    Ran test claim for Eliquis 5 mg and the current 30 day co-pay is $40.00.   This test claim was processed through [facility]- copay amounts may vary at other pharmacies due to pharmacy/plan contracts, or as the patient moves through the different stages of their insurance plan.     Reyes Sharps, CPHT Pharmacy Technician Patient Advocate Specialist Lead [facility] Patient Advocate Team Direct Number: [phone]  Fax: [phone]

## 2024-04-14 NOTE — ED Notes (Signed)
 Pt assisted to the bedside commode and then back into bed. 2L [place] placed per pts request, denies any additional needs at this time, NAD Noted, call bell within reach, caregiver at bedside

## 2024-04-14 NOTE — Evaluation (Signed)
 Occupational Therapy Evaluation Patient Details Name: Elizabeth [name] MRN: [id number] DOB: 09-Apr-1950 Today's Date: 04/14/2024   History of Present Illness   Pt is a 74 year old female resented to the ED with generalized weakness along with worsening cough, shortness of breath and recurrent fever despite being on oral antibiotics for pneumonia, admitted with Severe hypokalemia, PNA,     PMH significant for hypertension, hyperlipidemia, type 2 diabetes, atrial fibrillation,  Stage Ia left lower lobe cancer     Clinical Impressions Chart reviewed to date, pt greeted sitting on edge of bed, agreeable to OT evaluation. PTA pt is generally MOD I with feeding, grooming, dressing, toileting; has PRN assist. She also has assist for seated bathing tasks and IADLs. She amb with no AD. Pt presents with deficits in strength, endurance, activity tolerance, balance, affecting safe and optimal ADL completion. LB dressing completed with MAX A, set up for seated grooming/feeding tasks, STS with RW with CGA, amb approx 50' with RW. Spo2 to high 80s% on RA (poor pleth) with pt reporting SOB with mobility. Spo2 >90% on RA at rest sitting on edge of bed. Pt is left as received, all needs met.She is performing ADL/functional mobility below PLOF, will benefit from acute OT to address functional deficits and to facilitate optimal ADL/functional mobility engagement.      If plan is discharge home, recommend the following:   A little help with walking and/or transfers;A little help with bathing/dressing/bathroom     Functional Status Assessment   Patient has had a recent decline in their functional status and demonstrates the ability to make significant improvements in function in a reasonable and predictable amount of time.     Equipment Recommendations   None recommended by OT     Recommendations for Other Services         Precautions/Restrictions   Precautions Precautions: Fall Recall of  Precautions/Restrictions: Intact Restrictions Weight Bearing Restrictions Per Provider Order: No     Mobility Bed Mobility Overal bed mobility: Needs Assistance             General bed mobility comments: NT sitting on edge of bed pre/post session    Transfers Overall transfer level: Needs assistance Equipment used: Rolling walker (2 wheels) Transfers: Sit to/from Stand Sit to Stand: Contact guard assist                  Balance Overall balance assessment: Needs assistance Sitting-balance support: Feet supported Sitting balance-Leahy Scale: Good     Standing balance support: Bilateral upper extremity supported, During functional activity, Reliant on assistive device for balance Standing balance-Leahy Scale: Fair                             ADL either performed or assessed with clinical judgement   ADL Overall ADL's : Needs assistance/impaired Eating/Feeding: Set up;Sitting                   Lower Body Dressing: Maximal assistance Lower Body Dressing Details (indicate cue type and reason): donning socks Toilet Transfer: Contact guard assist;Rolling walker (2 wheels);Ambulation Toilet Transfer Details (indicate cue type and reason): simulated         Functional mobility during ADLs: Contact guard assist;Rolling walker (2 wheels);Cueing for sequencing (approx 36' with RW)       Vision Patient Visual Report: No change from baseline       Perception  Praxis         Pertinent Vitals/Pain Pain Assessment Pain Assessment: No/denies pain     Extremity/Trunk Assessment Upper Extremity Assessment Upper Extremity Assessment: Generalized weakness   Lower Extremity Assessment Lower Extremity Assessment: Generalized weakness       Communication Communication Communication: No apparent difficulties   Cognition Arousal: Alert Behavior During Therapy: WFL for tasks assessed/performed Cognition: No apparent impairments                                Following commands: Impaired Following commands impaired: Follows one step commands with increased time     Cueing  General Comments   Cueing Techniques: Verbal cues      Exercises Other Exercises Other Exercises: edu re role of OT, role of rehab, discharge recommendations   Shoulder Instructions      Home Living Family/patient expects to be discharged to:: Private residence Living Arrangements: Children (daugther, son in social worker, grandson (10), caregiver) Available Help at Discharge: Family;Available 24 hours/day Type of Home: House Home Access: Stairs to enter [employer] of Steps: 4-5 Entrance Stairs-Rails: Right;Left;Can reach both Home Layout: One level     Bathroom Shower/Tub: Chief Strategy Officer: Standard Bathroom Accessibility: Yes   Home Equipment: Tub bench;Grab bars - tub/shower;Grab bars - toilet;Rollator (4 wheels);BSC/3in1   Additional Comments: 2 L o2 at night      Prior Functioning/Environment Prior Level of Function : Needs assist             Mobility Comments: amb household distances with no AD, will go out into the community for limited distances with daugther, generally no AD per her report ADLs Comments: assist for bathing and IADLs; feeding, grooming, dressing, toileting grossly with MOD I, PRN assist    OT Problem List: Decreased activity tolerance;Decreased strength;Impaired balance (sitting and/or standing);Decreased safety awareness;Cardiopulmonary status limiting activity;Decreased knowledge of use of DME or AE   OT Treatment/Interventions: Self-care/ADL training;Therapeutic exercise;Energy conservation;DME and/or AE instruction;Therapeutic activities;Patient/family education;Balance training      OT Goals(Current goals can be found in the care plan section)   Acute Rehab OT Goals Patient Stated Goal: improve function OT Goal Formulation: With patient Time For Goal  Achievement: 04/28/24 Potential to Achieve Goals: Good ADL Goals Pt Will Perform Grooming: with modified independence;sitting;standing Pt Will Perform Lower Body Dressing: with modified independence;sitting/lateral leans Pt Will Transfer to Toilet: with modified independence;ambulating Pt Will Perform Toileting - Clothing Manipulation and hygiene: with modified independence;sitting/lateral leans;sit to/from stand   OT Frequency:  Min 2X/week    Co-evaluation              AM-PAC OT 6 Clicks Daily Activity     Outcome Measure Help from another person eating meals?: None Help from another person taking care of personal grooming?: None Help from another person toileting, which includes using toliet, bedpan, or urinal?: A Little Help from another person bathing (including washing, rinsing, drying)?: A Little Help from another person to put on and taking off regular upper body clothing?: None Help from another person to put on and taking off regular lower body clothing?: A Little 6 Click Score: 21   End of Session Equipment Utilized During Treatment: Rolling walker (2 wheels) Nurse Communication: Mobility status  Activity Tolerance: Patient tolerated treatment well Patient left: with call bell/phone within reach;with bed alarm set;with family/visitor present (sitting on edge of bed)  OT Visit Diagnosis: Other abnormalities of gait  and mobility (R26.89);Muscle weakness (generalized) (M62.81);History of falling (Z91.81)                Time: 9140-9085 OT Time Calculation (min): 15 min Charges:  OT General Charges $OT Visit: 1 Visit OT Evaluation $OT Eval Low Complexity: 1 Low  Therisa Sheffield, OTD OTR/L  04/14/2024, 11:32 AM

## 2024-04-14 NOTE — ED Notes (Signed)
 NURSE ROD RN INFORMED OF BED ASSIGNED

## 2024-04-14 NOTE — Evaluation (Signed)
 Physical Therapy Evaluation Patient Details Name: Elizabeth [name] MRN: [id number] DOB: [birth year] Today's Date: 04/14/2024  History of Present Illness  Patient is a 74 year old female with generalized weakness, worsening cough, shortness of breath, recurrent fever despite being on antibiotics for pneumonia. PMH: HTN, HLD, DM2, atrial fibrillation  Clinical Impression  Patient requesting to get up to the bathroom on arrival to room. She is typically independent, lives with family and caregiver.  Patient required CGA/supervision for stand step transfer to [facility]. She is fatigued with activity with mild dyspnea with exertion. She declined walking due to fatigue but has walked in the hallway already today. She reports feeling generalized weakness compared to her baseline. Recommend to continue PT to maximize independence and facilitate return to prior level of function.       If plan is discharge home, recommend the following: Assist for transportation;Assistance with cooking/housework;Help with stairs or ramp for entrance   Can travel by private vehicle        Equipment Recommendations None recommended by PT  Recommendations for Other Services       Functional Status Assessment Patient has had a recent decline in their functional status and demonstrates the ability to make significant improvements in function in a reasonable and predictable amount of time.     Precautions / Restrictions Precautions Precautions: Fall Recall of Precautions/Restrictions: Intact Restrictions Weight Bearing Restrictions Per Provider Order: No      Mobility  Bed Mobility               General bed mobility comments: patient sitting up on arrival and post session. bed mobility not assessed    Transfers Overall transfer level: Needs assistance Equipment used: Rolling walker (2 wheels) Transfers: Bed to chair/wheelchair/BSC     Step pivot transfers: Contact guard assist, Supervision        General transfer comment: for transfer to/from [facility]    Ambulation/Gait               General Gait Details: patient declined walking. she reports feeling generalized weakness compared to baseline and is fatigued with activity  Stairs            Wheelchair Mobility     Tilt Bed    Modified Rankin (Stroke Patients Only)       Balance Overall balance assessment: Needs assistance Sitting-balance support: Feet supported Sitting balance-Leahy Scale: Good     Standing balance support: Bilateral upper extremity supported, During functional activity, Reliant on assistive device for balance Standing balance-Leahy Scale: Fair Standing balance comment: patient able to stand to complete toileting tasks with supervision. no UE support required                             Pertinent Vitals/Pain Pain Assessment Pain Assessment: No/denies pain    Home Living Family/patient expects to be discharged to:: Private residence Living Arrangements: Children Available Help at Discharge: Family;Available 24 hours/day Type of Home: House Home Access: Stairs to enter Entrance Stairs-Rails: Right;Left;Can reach both Entrance Stairs-Number of Steps: 4-5   Home Layout: One level Home Equipment: Tub bench;Grab bars - tub/shower;Grab bars - toilet;Rollator (4 wheels);BSC/3in1 Additional Comments: 2 L o2 at night    Prior Function Prior Level of Function : Needs assist             Mobility Comments: amb household distances with no AD, will go out into the community for limited distances with daugther, generally no  AD per her report ADLs Comments: assist for bathing and IADLs; feeding, grooming, dressing, toileting grossly with MOD I, PRN assist     Extremity/Trunk Assessment   Upper Extremity Assessment Upper Extremity Assessment: Generalized weakness    Lower Extremity Assessment Lower Extremity Assessment: Generalized weakness       Communication    Communication Communication: No apparent difficulties    Cognition Arousal: Alert Behavior During Therapy: WFL for tasks assessed/performed   PT - Cognitive impairments: No apparent impairments                         Following commands: Impaired Following commands impaired: Follows one step commands with increased time     Cueing Cueing Techniques: Verbal cues     General Comments General comments (skin integrity, edema, etc.): patient declined walking but reports she will next session. of note she has already ambulated in hallway with OT today. mild dyspnea with exertion with Sp02 95% on room air    Exercises     Assessment/Plan    PT Assessment Patient needs continued PT services  PT Problem List Cardiopulmonary status limiting activity;Decreased strength;Decreased mobility       PT Treatment Interventions DME instruction;Gait training;Stair training;Functional mobility training;Therapeutic activities;Therapeutic exercise;Balance training;Neuromuscular re-education;Patient/family education    PT Goals (Current goals can be found in the Care Plan section)  Acute Rehab PT Goals Patient Stated Goal: to feel better and return home PT Goal Formulation: With patient Time For Goal Achievement: 04/28/24 Potential to Achieve Goals: Good    Frequency Min 1X/week     Co-evaluation               AM-PAC PT 6 Clicks Mobility  Outcome Measure Help needed turning from your back to your side while in a flat bed without using bedrails?: None Help needed moving from lying on your back to sitting on the side of a flat bed without using bedrails?: A Little Help needed moving to and from a bed to a chair (including a wheelchair)?: A Little Help needed standing up from a chair using your arms (e.g., wheelchair or bedside chair)?: A Little Help needed to walk in hospital room?: A Little Help needed climbing 3-5 steps with a railing? : A Little 6 Click Score: 19     End of Session   Activity Tolerance: Patient limited by fatigue Patient left: in bed;with call bell/phone within reach;with bed alarm set Nurse Communication: Mobility status PT Visit Diagnosis: Muscle weakness (generalized) (M62.81);Unsteadiness on feet (R26.81)    Time: 8880-8870 PT Time Calculation (min) (ACUTE ONLY): 10 min   Charges:   PT Evaluation $PT Eval Low Complexity: 1 Low   PT General Charges $$ ACUTE PT VISIT: 1 Visit         Randine Essex, PT, MPT  Randine LULLA Essex 04/14/2024, 12:22 PM

## 2024-04-14 NOTE — Progress Notes (Signed)
 " PROGRESS NOTE    Elizabeth [name]  FMW:[id number] DOB: Feb 17, 1951 DOA: 04/13/2024 PCP: Edman Marsa PARAS, DO    Brief Narrative:   74 y.o. year old female with medical history of hypertension, hyperlipidemia, type 2 diabetes, atrial fibrillation presented to the ED with generalized weakness along with worsening cough, shortness of breath and recurrent fever despite being on oral antibiotics for pneumonia.  She was evaluated by her PCP on 01/09 and given oral antibiotics including cefdinir  and azithromycin  which was later discontinued as there was determined her QTc was prolonged. On arrival to the ED patient was noted to be HDS stable.  Lab work and imaging obtained.  CBC without leukocytosis, mild anemia near baseline.  Anemia is normocytic and very mild.  BMP with severe hypokalemia 2.5, slight creatinine elevation.  Troponin mildly elevated and flat.  UA without signs infection.  Chest x-ray showing pneumonia.  Blood cultures obtained patient started on antibiotics.  Given need for continued care, [facility] contacted for admission.    Assessment & Plan:   Principal Problem:   Community acquired pneumonia Active Problems:   Benign essential hypertension   Major depressive disorder, recurrent, moderate (HCC)   Mixed hyperlipidemia   OSA on CPAP   Type 2 diabetes mellitus with other specified complication (HCC)   Persistent atrial fibrillation (HCC)   Community-acquired pneumonia Pt presented with exertional shortness of breath, fevers and image finding showing an opacity admitted for IV therapy as she has failed outpatient oral antibiotics.  Recent CT scan showed tree-in-bud nodularity in the right lower lobe representing infectious or inflammatory bronchiolitis.  There is also area of consolidation in the left lung in the periphery Plan: Continue current Rocephin  and azithromycin  Follow-up blood cultures Currently on room air.  Oxygen with vital signs checks  Severe hypokalemia,  improved Poor intake in the setting of recent illness Plan: Monitor and replace as needed   Stage Ia left lower lobe cancer:  Patient status post SBRT.  Biopsy was not performed.  Per oncology she will need repeat CT scan on July 2026 to check for recurrent disease.  Scan in July 2025 did not show any recurrent disease Plan: Outpatient follow-up   HTN: continue home meds HLD: continue home meds T2DM: hold home meds, start on SSI and titrate COPD/Asthma: continue home inhalers MDD/GAD: continue home meds Paroxysmal atrial fibrillation: Continue home regimen.   DVT prophylaxis: Eliquis  Code Status: Full Family Communication: None Disposition Plan: Status is: Inpatient Remains inpatient appropriate because: CAP on IV antibiotics   Level of care: Telemetry  Consultants:  None  Procedures:  None  Antimicrobials: Ceftriaxone  Doxycycline    Subjective: Seen and examined.  Resting in bed.  Reports fatigue and shortness of breath though improved from prior.  Objective: Vitals:   04/14/24 0600 04/14/24 0803 04/14/24 1018 04/14/24 1223  BP:  (!) 149/70  (!) 120/51  Pulse: (!) 51 (!) 104  96  Resp: (!) 22 (!) 25  16  Temp: 99.1 F (37.3 C)  100.2 F (37.9 C)   TempSrc: Axillary  Oral   SpO2: 94% 94%  96%  Weight:      Height:        Intake/Output Summary (Last 24 hours) at 04/14/2024 1431 Last data filed at 04/13/2024 2001 Gross per 24 hour  Intake 250 ml  Output --  Net 250 ml   Filed Weights   04/13/24 1551 04/13/24 1555 04/13/24 2027  Weight: 87.5 kg 87.5 kg 91.3 kg    Examination:  General exam: Appears fatigued Respiratory system: Scattered crackles at bases, worse on the left.  Normal work of breathing.  Room air Cardiovascular system: S1-S2, RRR, no murmurs, no pedal edema Gastrointestinal system: Soft, NT/ND, bowel sounds Central nervous system: Alert and oriented. No focal neurological deficits. Extremities: Symmetric 5 x 5 power. Skin: No rashes,  lesions or ulcers Psychiatry: Judgement and insight appear normal. Mood & affect appropriate.     Data Reviewed: I have personally reviewed following labs and imaging studies  CBC: Recent Labs  Lab 04/10/24 1140 04/13/24 1601 04/14/24 0350  WBC 8.4 8.2 6.9  NEUTROABS 5,578  --   --   HGB 11.4* 11.5* 10.6*  HCT 35.4* 34.7* 33.0*  MCV 85.1 83.0 84.6  PLT 297 295 282   Basic Metabolic Panel: Recent Labs  Lab 04/10/24 1140 04/13/24 1601 04/13/24 2028 04/14/24 0350  NA 140 135  --  138  K 3.0* 2.5*  --  3.1*  CL 98 95*  --  102  CO2 30 28  --  26  GLUCOSE 117* 142*  --  119*  BUN 11 10  --  10  CREATININE 0.99 1.08*  --  0.97  CALCIUM  8.6 9.0  --  8.2*  MG  --   --  2.0  --    GFR: Estimated Creatinine Clearance: 53.5 mL/min (by C-G formula based on SCr of 0.97 mg/dL). Liver Function Tests: No results for input(s): AST, ALT, ALKPHOS, BILITOT, PROT, ALBUMIN in the last 168 hours. No results for input(s): LIPASE, AMYLASE in the last 168 hours. No results for input(s): AMMONIA in the last 168 hours. Coagulation Profile: Recent Labs  Lab 04/13/24 1601  INR 1.0   Cardiac Enzymes: No results for input(s): CKTOTAL, CKMB, CKMBINDEX, TROPONINI in the last 168 hours. BNP (last 3 results) No results for input(s): PROBNP in the last 8760 hours. HbA1C: No results for input(s): HGBA1C in the last 72 hours. CBG: Recent Labs  Lab 04/14/24 1327  GLUCAP 175*   Lipid Profile: No results for input(s): CHOL, HDL, LDLCALC, TRIG, CHOLHDL, LDLDIRECT in the last 72 hours. Thyroid  Function Tests: No results for input(s): TSH, T4TOTAL, FREET4, T3FREE, THYROIDAB in the last 72 hours. Anemia Panel: No results for input(s): VITAMINB12, FOLATE, FERRITIN, TIBC, IRON , RETICCTPCT in the last 72 hours. Sepsis Labs: Recent Labs  Lab 04/14/24 0350  PROCALCITON 0.20    Recent Results (from the past 240 hours)  Resp panel  by RT-PCR (RSV, Flu A&B, Covid) Anterior Nasal Swab     Status: None   Collection Time: 04/06/24 10:31 AM   Specimen: Anterior Nasal Swab  Result Value Ref Range Status   SARS Coronavirus 2 by RT PCR NEGATIVE NEGATIVE Final    Comment: (NOTE) SARS-CoV-2 target nucleic acids are NOT DETECTED.  The SARS-CoV-2 RNA is generally detectable in upper respiratory specimens during the acute phase of infection. The lowest concentration of SARS-CoV-2 viral copies this assay can detect is 138 copies/mL. A negative result does not preclude SARS-Cov-2 infection and should not be used as the sole basis for treatment or other patient management decisions. A negative result may occur with  improper specimen collection/handling, submission of specimen other than nasopharyngeal swab, presence of viral mutation(s) within the areas targeted by this assay, and inadequate number of viral copies(<138 copies/mL). A negative result must be combined with clinical observations, patient history, and epidemiological information. The expected result is Negative.  Fact Sheet for Patients:  bloggercourse.com  Fact Sheet for Healthcare Providers:  seriousbroker.it  This test is no t yet approved or cleared by the [place]  FDA and  has been authorized for detection and/or diagnosis of SARS-CoV-2 by FDA under an Emergency Use Authorization (EUA). This EUA will remain  in effect (meaning this test can be used) for the duration of the COVID-19 declaration under Section 564(b)(1) of the Act, 21 U.S.C.section 360bbb-3(b)(1), unless the authorization is terminated  or revoked sooner.       Influenza A by PCR NEGATIVE NEGATIVE Final   Influenza B by PCR NEGATIVE NEGATIVE Final    Comment: (NOTE) The Xpert Xpress SARS-CoV-2/FLU/RSV plus assay is intended as an aid in the diagnosis of influenza from Nasopharyngeal swab specimens and should not be used as a sole  basis for treatment. Nasal washings and aspirates are unacceptable for Xpert Xpress SARS-CoV-2/FLU/RSV testing.  Fact Sheet for Patients: bloggercourse.com  Fact Sheet for Healthcare Providers: seriousbroker.it  This test is not yet approved or cleared by the [place]  FDA and has been authorized for detection and/or diagnosis of SARS-CoV-2 by FDA under an Emergency Use Authorization (EUA). This EUA will remain in effect (meaning this test can be used) for the duration of the COVID-19 declaration under Section 564(b)(1) of the Act, 21 U.S.C. section 360bbb-3(b)(1), unless the authorization is terminated or revoked.     Resp Syncytial Virus by PCR NEGATIVE NEGATIVE Final    Comment: (NOTE) Fact Sheet for Patients: bloggercourse.com  Fact Sheet for Healthcare Providers: seriousbroker.it  This test is not yet approved or cleared by the [place]  FDA and has been authorized for detection and/or diagnosis of SARS-CoV-2 by FDA under an Emergency Use Authorization (EUA). This EUA will remain in effect (meaning this test can be used) for the duration of the COVID-19 declaration under Section 564(b)(1) of the Act, 21 U.S.C. section 360bbb-3(b)(1), unless the authorization is terminated or revoked.  Performed at [facility], [address] [place], [place] [address]   Resp panel by RT-PCR (RSV, Flu A&B, Covid) Anterior Nasal Swab     Status: None   Collection Time: 04/13/24  8:28 PM   Specimen: Anterior Nasal Swab  Result Value Ref Range Status   SARS Coronavirus 2 by RT PCR NEGATIVE NEGATIVE Final    Comment: (NOTE) SARS-CoV-2 target nucleic acids are NOT DETECTED.  The SARS-CoV-2 RNA is generally detectable in upper respiratory specimens during the acute phase of infection. The lowest concentration of SARS-CoV-2 viral copies this assay can detect is 138  copies/mL. A negative result does not preclude SARS-Cov-2 infection and should not be used as the sole basis for treatment or other patient management decisions. A negative result may occur with  improper specimen collection/handling, submission of specimen other than nasopharyngeal swab, presence of viral mutation(s) within the areas targeted by this assay, and inadequate number of viral copies(<138 copies/mL). A negative result must be combined with clinical observations, patient history, and epidemiological information. The expected result is Negative.  Fact Sheet for Patients:  bloggercourse.com  Fact Sheet for Healthcare Providers:  seriousbroker.it  This test is no t yet approved or cleared by the [place]  FDA and  has been authorized for detection and/or diagnosis of SARS-CoV-2 by FDA under an Emergency Use Authorization (EUA). This EUA will remain  in effect (meaning this test can be used) for the duration of the COVID-19 declaration under Section 564(b)(1) of the Act, 21 U.S.C.section 360bbb-3(b)(1), unless the authorization is terminated  or revoked sooner.       Influenza  A by PCR NEGATIVE NEGATIVE Final   Influenza B by PCR NEGATIVE NEGATIVE Final    Comment: (NOTE) The Xpert Xpress SARS-CoV-2/FLU/RSV plus assay is intended as an aid in the diagnosis of influenza from Nasopharyngeal swab specimens and should not be used as a sole basis for treatment. Nasal washings and aspirates are unacceptable for Xpert Xpress SARS-CoV-2/FLU/RSV testing.  Fact Sheet for Patients: bloggercourse.com  Fact Sheet for Healthcare Providers: seriousbroker.it  This test is not yet approved or cleared by the [place]  FDA and has been authorized for detection and/or diagnosis of SARS-CoV-2 by FDA under an Emergency Use Authorization (EUA). This EUA will remain in effect (meaning  this test can be used) for the duration of the COVID-19 declaration under Section 564(b)(1) of the Act, 21 U.S.C. section 360bbb-3(b)(1), unless the authorization is terminated or revoked.     Resp Syncytial Virus by PCR NEGATIVE NEGATIVE Final    Comment: (NOTE) Fact Sheet for Patients: bloggercourse.com  Fact Sheet for Healthcare Providers: seriousbroker.it  This test is not yet approved or cleared by the [place]  FDA and has been authorized for detection and/or diagnosis of SARS-CoV-2 by FDA under an Emergency Use Authorization (EUA). This EUA will remain in effect (meaning this test can be used) for the duration of the COVID-19 declaration under Section 564(b)(1) of the Act, 21 U.S.C. section 360bbb-3(b)(1), unless the authorization is terminated or revoked.  Performed at [facility], [address] [place], [place] [address]   Culture, blood (Routine X 2) w Reflex to ID Panel     Status: None (Preliminary result)   Collection Time: 04/13/24  8:28 PM   Specimen: BLOOD  Result Value Ref Range Status   Specimen Description BLOOD BLOOD RIGHT ARM  Final   Special Requests   Final    BOTTLES DRAWN AEROBIC AND ANAEROBIC Blood Culture results may not be optimal due to an inadequate volume of blood received in culture bottles   Culture   Final    NO GROWTH < 12 HOURS Performed at [facility], [address] [place], [place] [address]    Report Status PENDING  Incomplete  Culture, blood (Routine X 2) w Reflex to ID Panel     Status: None (Preliminary result)   Collection Time: 04/13/24  8:30 PM   Specimen: BLOOD  Result Value Ref Range Status   Specimen Description BLOOD BLOOD LEFT ARM  Final   Special Requests   Final    BOTTLES DRAWN AEROBIC AND ANAEROBIC Blood Culture results may not be optimal due to an inadequate volume of blood received in culture bottles   Culture   Final    NO GROWTH < 12  HOURS Performed at [facility], [address] [place], [place] [address]    Report Status PENDING  Incomplete         Radiology Studies: DG Chest 2 View Result Date: 04/13/2024 CLINICAL DATA:  Shortness of breath EXAM: CHEST - 2 VIEW COMPARISON:  Chest x-ray 04/06/2024, CT 04/10/2024, 03/03/2024, 10/09/2023 FINDINGS: No new consolidation, pleural effusion or pneumothorax. Stable vague left midlung opacity. No pneumothorax IMPRESSION: Stable vague left midlung opacity, corresponding to mild airspace opacity on prior chest CT referenc chest CT 03/03/2024. No new or progressive airspace disease compared to the recent priors Electronically Signed   By: Luke Bun M.D.   On: 04/13/2024 17:30        Scheduled Meds:  apixaban   5 mg Oral BID   ARIPiprazole   5 mg Oral Daily   busPIRone   5 mg Oral TID   diltiazem   120 mg Oral Daily   fluticasone  furoate-vilanterol  1 puff Inhalation Daily   insulin  aspart  0-5 Units Subcutaneous QHS   insulin  aspart  0-9 Units Subcutaneous TID WC   ipratropium  2 spray Each Nare QID   metoprolol  succinate  25 mg Oral Daily   pantoprazole   40 mg Oral Daily   rosuvastatin   40 mg Oral Daily   sodium chloride  flush  3 mL Intravenous Q12H   torsemide   20 mg Oral BID   venlafaxine  XR  75 mg Oral Q breakfast   Continuous Infusions:  cefTRIAXone  (ROCEPHIN )  IV     doxycycline  (VIBRAMYCIN ) IV Stopped (04/14/24 1323)     LOS: 1 day     Calvin KATHEE Robson, MD [facility]   If 7PM-7AM, please contact night-coverage  04/14/2024, 2:31 PM   "

## 2024-04-15 DIAGNOSIS — J189 Pneumonia, unspecified organism: Secondary | ICD-10-CM | POA: Diagnosis not present

## 2024-04-15 LAB — GLUCOSE, CAPILLARY
Glucose-Capillary: 166 mg/dL — ABNORMAL HIGH (ref 70–99)
Glucose-Capillary: 187 mg/dL — ABNORMAL HIGH (ref 70–99)
Glucose-Capillary: 132 mg/dL — ABNORMAL HIGH (ref 70–99)
Glucose-Capillary: 224 mg/dL — ABNORMAL HIGH (ref 70–99)

## 2024-04-15 LAB — BASIC METABOLIC PANEL WITH GFR
Anion gap: 12 (ref 5–15)
BUN: 10 mg/dL (ref 8–23)
CO2: 27 mmol/L (ref 22–32)
Calcium: 8.1 mg/dL — ABNORMAL LOW (ref 8.9–10.3)
Chloride: 98 mmol/L (ref 98–111)
Creatinine, Ser: 1.09 mg/dL — ABNORMAL HIGH (ref 0.44–1.00)
GFR, Estimated: 53 mL/min — ABNORMAL LOW
Glucose, Bld: 127 mg/dL — ABNORMAL HIGH (ref 70–99)
Potassium: 3 mmol/L — ABNORMAL LOW (ref 3.5–5.1)
Sodium: 138 mmol/L (ref 135–145)

## 2024-04-15 LAB — CBC
HCT: 32.9 % — ABNORMAL LOW (ref 36.0–46.0)
Hemoglobin: 10.8 g/dL — ABNORMAL LOW (ref 12.0–15.0)
MCH: 27.5 pg (ref 26.0–34.0)
MCHC: 32.8 g/dL (ref 30.0–36.0)
MCV: 83.7 fL (ref 80.0–100.0)
Platelets: 293 K/uL (ref 150–400)
RBC: 3.93 MIL/uL (ref 3.87–5.11)
RDW: 14 % (ref 11.5–15.5)
WBC: 7.9 K/uL (ref 4.0–10.5)
nRBC: 0 % (ref 0.0–0.2)

## 2024-04-15 LAB — STREP PNEUMONIAE URINARY ANTIGEN: Strep Pneumo Urinary Antigen: NEGATIVE

## 2024-04-15 MED ORDER — DOXYCYCLINE HYCLATE 100 MG PO TABS: 100.0000 mg | ORAL_TABLET | Freq: Two times a day (BID) | ORAL | Status: DC

## 2024-04-15 MED ORDER — POTASSIUM CHLORIDE 10 MEQ/100ML IV SOLN
10.0000 meq | INTRAVENOUS | Status: AC
  Administered 2024-04-15 (×4): 10 meq via INTRAVENOUS
  Filled 2024-04-15 (×4): qty 100

## 2024-04-15 MED ORDER — MAGNESIUM SULFATE 2 GM/50ML IV SOLN
2.0000 g | Freq: Once | INTRAVENOUS | Status: AC
  Administered 2024-04-15: 2 g via INTRAVENOUS
  Filled 2024-04-15: qty 50

## 2024-04-15 MED ORDER — POTASSIUM CHLORIDE CRYS ER 20 MEQ PO TBCR
40.0000 meq | EXTENDED_RELEASE_TABLET | Freq: Once | ORAL | Status: AC
  Administered 2024-04-15: 40 meq via ORAL
  Filled 2024-04-15: qty 2

## 2024-04-15 MED ORDER — AMOXICILLIN-POT CLAVULANATE 875-125 MG PO TABS
1.0000 | ORAL_TABLET | Freq: Two times a day (BID) | ORAL | Status: DC
  Administered 2024-04-15 – 2024-04-16 (×2): 1 via ORAL
  Filled 2024-04-15 (×2): qty 1

## 2024-04-15 NOTE — Consult Note (Signed)
 "    PULMONOLOGY         Date: 04/15/2024,   MRN# [id number] Elizabeth [name] [birth year]     AdmissionWeight: 87.5 kg                 CurrentWeight: 91 kg  Referring provider: Dr Fernand   CHIEF COMPLAINT:   Respiratory distress with hypoxemia   HISTORY OF PRESENT ILLNESS   This is a 74 yo F with hx of AF, Allergic rhinitis, B12 def, COPD, dyslpidemia, lung nodules, substance abuse, QTC prolongation, depression, IDA, lung cancer, OSA, depression who had multiple admissions in past 1 year for Acute exacerbation of COPD. She generally sees [facility] pulmonary for her chronic lung condition.  She uses symbicort , spiriva , albuterol  and nebulizer therapy with good compliance. She lives in facility.  She uses 2L/min [place] for chronic hypoxemia. Had Paxlovid in 22 and 23 for acute COVID infection with hypoxemia.  She still smokes. She had treatment for lung cancer of Left lower lobe but this was presumed diagnosis due to spiculated 9mm nodule.  She has OSA and was supposed to have PSG done.  She has depression.    PAST MEDICAL HISTORY   Past Medical History:  Diagnosis Date   A-fib [facility])    Allergy    Anxiety    B12 deficiency    Cancer (HCC)    Carpal tunnel syndrome    Centrilobular emphysema (HCC)    COPD (chronic obstructive pulmonary disease) (HCC)    Depression    Genetic testing    Common Cancers panel (47 genes) @ [facility] - No pathogenic mutations detected   HLD (hyperlipidemia)    Hyperlipidemia    Hypertension    Iron  deficiency anemia 10/16/2016   Lipoma of skin    Lung nodule    Normocytic anemia 10/16/2016   QT prolongation    Substance abuse (HCC)      SURGICAL HISTORY   Past Surgical History:  Procedure Laterality Date   ABDOMINAL HYSTERECTOMY     BREAST EXCISIONAL BIOPSY Left    1980's neg   CESAREAN SECTION     TUBAL LIGATION       FAMILY HISTORY   Family History  Problem Relation Age of Onset   Ovarian cancer Mother 58       deceased 7   Breast  cancer Mother 74   Stroke Father    Lung cancer Paternal Aunt    Lung cancer Cousin      SOCIAL HISTORY   Social History[1]   MEDICATIONS    Home Medication:    Current Medication: Current Medications[2]    ALLERGIES   Moxifloxacin, Levofloxacin, and Amoxicillin -pot clavulanate     REVIEW OF SYSTEMS    Review of Systems:  Gen:  Denies  fever, sweats, chills weigh loss  HEENT: Denies blurred vision, double vision, ear pain, eye pain, hearing loss, nose bleeds, sore throat Cardiac:  No dizziness, chest pain or heaviness, chest tightness,edema Resp:   reports dyspnea chronically  Gi: Denies swallowing difficulty, stomach pain, nausea or vomiting, diarrhea, constipation, bowel incontinence Gu:  Denies bladder incontinence, burning urine Ext:   Denies Joint pain, stiffness or swelling Skin: Denies  skin rash, easy bruising or bleeding or hives Endoc:  Denies polyuria, polydipsia , polyphagia or weight change Psych:   Denies depression, insomnia or hallucinations   Other:  All other systems negative   VS: BP 119/70 (BP Location: Left Arm)   Pulse 82   Temp 98.5 F (  36.9 C) (Oral)   Resp 18   Ht 5' 2 (1.575 m)   Wt 91 kg   SpO2 98%   BMI 36.69 kg/m      PHYSICAL EXAM    GENERAL:NAD, no fevers, chills, no weakness no fatigue HEAD: Normocephalic, atraumatic.  EYES: Pupils equal, round, reactive to light. Extraocular muscles intact. No scleral icterus.  MOUTH: Moist mucosal membrane. Dentition intact. No abscess noted.  EAR, NOSE, THROAT: Clear without exudates. No external lesions.  NECK: Supple. No thyromegaly. No nodules. No JVD.  PULMONARY: decreased breath sounds with mild rhonchi worse at bases bilaterally.  CARDIOVASCULAR: S1 and S2. Regular rate and rhythm. No murmurs, rubs, or gallops. No edema. Pedal pulses 2+ bilaterally.  GASTROINTESTINAL: Soft, nontender, nondistended. No masses. Positive bowel sounds. No hepatosplenomegaly.   MUSCULOSKELETAL: No swelling, clubbing, or edema. Range of motion full in all extremities.  NEUROLOGIC: Cranial nerves II through XII are intact. No gross focal neurological deficits. Sensation intact. Reflexes intact.  SKIN: No ulceration, lesions, rashes, or cyanosis. Skin warm and dry. Turgor intact.  PSYCHIATRIC: Mood, affect within normal limits. The patient is awake, alert and oriented x 3. Insight, judgment intact.       IMAGING     Narrative & Impression  EXAM: CT CHEST WITHOUT CONTRAST 04/10/2024 12:40:05 PM   TECHNIQUE: CT of the chest was performed without the administration of intravenous contrast. Multiplanar reformatted images are provided for review. Automated exposure control, iterative reconstruction, and/or weight based adjustment of the mA/kV was utilized to reduce the radiation dose to as low as reasonably achievable.   COMPARISON: 03/03/2024   CLINICAL HISTORY: Pneumonia, complication suspected, xray done; interval left lower lobe opacity follow-up, fever symptoms of pneumonia.   FINDINGS:   MEDIASTINUM: Calcified atherosclerosis in the LAD. Heart and pericardium are otherwise unremarkable. The central airways are clear.   LYMPH NODES: No mediastinal, hilar or axillary lymphadenopathy.   LUNGS AND PLEURA: Similar small region of consolidation in the anterobasal left lower lobe. , likely scarring Tree in bud nodularity in the right lower lobe. Unchanged 3 mm right upper lobe nodule (axial 38). 2 mm subpleural nodule in the superior segment of the right lower lobe (axial 43). 2 mm lateral nodule in the superior segment of the right lower lobe (axial 48). Minimal centrilobular emphysema. 3 mm nodule in the right lung apex (axial 21). 3 mm nodule in the subpleural left lower lobe (axial 111). No pleural effusion or pneumothorax.   SOFT TISSUES/BONES: No acute abnormality of the bones or soft tissues. Left-sided rib fractures are again noted.  Osteopenia. Multilevel degenerative disc disease of the spine.   UPPER ABDOMEN: Cholecystolithiasis. No changes of acute cholecystitis.   IMPRESSION: 1. Tree-in-bud nodularity in the right lower lobe, likely representing an infectious or inflammatory bronchiolitis or changes of aspiration. No lobar pneumonia or pleural effusion. 2. Similar small region of consolidation in the anterobasal left lower lobe, likely representing scarring. 3. Scattered 2 to 3 mm nodules again noted throughout both lungs, unchanged. Attention on routine oncologic follow-up recommended. 4. Cholecystolithiasis.   Electronically signed by: Rogelia Myers MD MD 04/10/2024 02:32 PM EST RP Workstation: [id number]    ASSESSMENT/PLAN      Community acquired pneumonia On Rocephin  IV -doing well clinically  -reviewed chest imaging - mild RLL infiltrate and old left lung lesion where cancer was treated with left perifissural post treatment radiation changes -currently on ambient air with no dyspnea at rest - recommend PT/OT and dc planning -transitioning to  PO antibiotics with doxycycline  BID     COPD with centrilobular emphysema        Restart home spiriva  and symbicort  with PRN abluterol     - IS and flutter valve    - PT/OT    Hx of left lung cancer    - current CT with expected post radiation changes without signs of recurrence     - no adenopathy noted on this noncontrasted study dated 04/10/24    Chronic hypoxemic respiratory failure    - continue PT/OT    - continue weight loss, patient currently on rybelsus .  If patient can achieve 35lb weight loss I believe she will be able to come off oxygen     - pulmonary rehab lung works program for COPD    - continue 2L/min [place] with exertion and nocturnally for now   Morbid obesity     BMI >36    - recommendation for weight reduction    - goal BMI <30,  continue Rybelsus    - lifestyle modification , calorie restriction intermittent fasting     -contributing to development of hypoxemia      Bibasilar atelectasis    - consider deep breathing exercises     - incentive spirometry     - chest physiotherapy, recruitment maneuvers with metaneb therapy    Hypokalemia and Acute kidney injury       - likely due to BID demadex        - have dcd this therapy for now      Thank you for allowing me to participate in the care of this patient.   Patient/Family are satisfied with care plan and all questions have been answered.    Provider disclosure: Patient with at least one acute or chronic illness or injury that poses a threat to life or bodily function and is being managed actively during this encounter.  All of the below services have been performed independently by signing provider:  review of prior documentation from internal and or external health records.  Review of previous and current lab results.  Interview and comprehensive assessment during patient visit today. Review of current and previous chest radiographs/CT scans. Discussion of management and test interpretation with health care team and patient/family.   This document was prepared using Dragon voice recognition software and may include unintentional dictation errors.     Oneika Simonian, M.D.  Division of Pulmonary & Critical Care Medicine             [1]  Social History Tobacco Use   Smoking status: Former    Current packs/day: 0.00    Average packs/day: 0.5 packs/day for 51.9 years (25.9 ttl pk-yrs)    Types: Cigarettes    Start date: 12/02/1971    Quit date: 10/18/2023    Years since quitting: 0.4   Smokeless tobacco: Never   Tobacco comments:    Quit smoking 10/18/2023        Started smoking at 74 years old.    Smoked 1.5PPD at her heaviest  Vaping Use   Vaping status: Some Days  Substance Use Topics   Alcohol use: No   Drug use: No  [2]  Current Facility-Administered Medications:    acetaminophen  (TYLENOL ) tablet 650 mg, 650 mg, Oral,  Q6H PRN, 650 mg at 04/14/24 0927 **OR** acetaminophen  (TYLENOL ) suppository 650 mg, 650 mg, Rectal, Q6H PRN, Fernand Prost, MD   albuterol  (PROVENTIL ) (2.5 MG/3ML) 0.083% nebulizer solution 2.5 mg, 2.5 mg, Nebulization, Q4H PRN, Sreenath,  Sudheer B, MD   apixaban  (ELIQUIS ) tablet 5 mg, 5 mg, Oral, BID, Khan, Ghalib, MD, 5 mg at 04/15/24 0813   ARIPiprazole  (ABILIFY ) tablet 5 mg, 5 mg, Oral, Daily, Sreenath, Sudheer B, MD, 5 mg at 04/15/24 0945   busPIRone  (BUSPAR ) tablet 5 mg, 5 mg, Oral, TID, Sreenath, Sudheer B, MD, 5 mg at 04/15/24 0813   cefTRIAXone  (ROCEPHIN ) 2 g in sodium chloride  0.9 % 100 mL IVPB, 2 g, Intravenous, Q24H, Fernand Prost, MD, Stopped at 04/14/24 1856   diltiazem  (CARDIZEM  CD) 24 hr capsule 120 mg, 120 mg, Oral, Daily, Sreenath, Sudheer B, MD, 120 mg at 04/15/24 0945   fluticasone  furoate-vilanterol (BREO ELLIPTA ) 100-25 MCG/ACT 1 puff, 1 puff, Inhalation, Daily, Sreenath, Sudheer B, MD   hydrOXYzine  (ATARAX ) tablet 25 mg, 25 mg, Oral, Q8H PRN, Sreenath, Sudheer B, MD, 25 mg at 04/15/24 9051   insulin  aspart (novoLOG ) injection 0-5 Units, 0-5 Units, Subcutaneous, QHS, Sreenath, Sudheer B, MD   insulin  aspart (novoLOG ) injection 0-9 Units, 0-9 Units, Subcutaneous, TID WC, Sreenath, Sudheer B, MD, 2 Units at 04/15/24 0944   ipratropium (ATROVENT ) 0.06 % nasal spray 2 spray, 2 spray, Each Nare, QID, Jhonny, Sudheer B, MD, 2 spray at 04/15/24 9185   metoprolol  succinate (TOPROL -XL) 24 hr tablet 25 mg, 25 mg, Oral, Daily, Sreenath, Sudheer B, MD, 25 mg at 04/15/24 0946   pantoprazole  (PROTONIX ) EC tablet 40 mg, 40 mg, Oral, Daily, Sreenath, Sudheer B, MD, 40 mg at 04/15/24 0813   potassium chloride  10 mEq in 100 mL IVPB, 10 mEq, Intravenous, Q1 Hr x 4, Al-Sultani, Anmar, MD, Last Rate: 100 mL/hr at 04/15/24 0942, 10 mEq at 04/15/24 0942   rosuvastatin  (CRESTOR ) tablet 40 mg, 40 mg, Oral, Daily, Sreenath, Sudheer B, MD, 40 mg at 04/15/24 9185   senna-docusate (Senokot-S) tablet 1  tablet, 1 tablet, Oral, QHS PRN, Fernand Prost, MD   sodium chloride  flush (NS) 0.9 % injection 3 mL, 3 mL, Intravenous, Q12H, Fernand Prost, MD, 3 mL at 04/15/24 0818   torsemide  (DEMADEX ) tablet 20 mg, 20 mg, Oral, BID, Sreenath, Sudheer B, MD, 20 mg at 04/15/24 9187   venlafaxine  XR (EFFEXOR -XR) 24 hr capsule 75 mg, 75 mg, Oral, Q breakfast, Sreenath, Sudheer B, MD, 75 mg at 04/15/24 0813  "

## 2024-04-15 NOTE — TOC Initial Note (Signed)
 Transition of Care [facility]) - Initial/Assessment Note    Patient Details  Name: Elizabeth [name] MRN: [id number] Date of Birth: [birth year]  Transition of Care [facility] CM/SW Contact:    Daved JONETTA Hamilton, RN Phone Number: 04/15/2024, 2:29 PM  Clinical Narrative:                  Met with patient, introduced self and explained role. Patient verbalized she lives at home with her adult daughter and a roommate Earnie. Earnie is present in the patient's room. Patient verbalized she is moderately independent and that she still drives from time to time but only a block or two to the grocery store or post office. Patient verbalized her daughter and Earnie help her at times. Daughter will transport patient home once medically ready for discharge.  Discussed with patient therapy recommendations for [facility] PT/OT, patient verbalized understanding and agreement. Patient verbalized she has had HH in the past but is unable to recall the agency name. This CM able to see in patient chart that she has had [facility] in the past, informed patient of this and patient verbalized [facility] as choice.   [facility] selected and completed in [place], [place] with [facility] notified of patient choice. Will notify [facility] upon patient discharge.   No DME recommendations at this time.   Expected Discharge Plan: Home w Home Health Services Barriers to Discharge: Continued Medical Work up   Patient Goals and CMS Choice     Choice offered to / list presented to : Patient      Expected Discharge Plan and Services   Discharge Planning Services: CM Consult Post Acute Care Choice: Home Health Living arrangements for the past 2 months: Single Family Home                           HH Arranged: PT, OT HH Agency: [facility] Date [facility] Agency Contacted: 04/15/24 Time HH Agency Contacted: 1428 Representative spoke with at [facility] Agency: [place]raine  Prior Living Arrangements/Services Living arrangements for the past 2 months: Single  Family Home Lives with:: Adult Children, Roommate Patient language and need for interpreter reviewed:: Yes Do you feel safe going back to the place where you live?: Yes      Need for Family Participation in Patient Care: No (Comment) Care giver support system in place?: Yes (comment)   Criminal Activity/Legal Involvement Pertinent to Current Situation/Hospitalization: No - Comment as needed  Activities of Daily Living      Permission Sought/Granted                  Emotional Assessment Appearance:: Appears stated age, Well-Groomed Attitude/Demeanor/Rapport: Engaged Affect (typically observed): Appropriate Orientation: : Oriented to Self, Oriented to Place, Oriented to  Time, Oriented to Situation Alcohol / Substance Use: Not Applicable Psych Involvement: No (comment)  Admission diagnosis:  Hypokalemia [E87.6] Weakness [R53.1] Community acquired pneumonia [J18.9] Fever, unspecified fever cause [R50.9] Pneumonia of left lung due to infectious organism, unspecified part of lung [J18.9] Patient Active Problem List   Diagnosis Date Noted   Community acquired pneumonia 04/13/2024   Morbid obesity (HCC) 04/10/2024   Persistent atrial fibrillation (HCC) 04/10/2024   Weakness generalized 10/09/2023   Type 2 diabetes mellitus with other specified complication (HCC) 10/06/2021   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) 07/04/2021   OSA on CPAP 06/26/2019   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 03/11/2018  Senile nuclear sclerosis, left 03/11/2018   Pernicious anemia 11/27/2016   Normocytic anemia 10/16/2016   Iron  deficiency anemia 10/16/2016   Major depressive disorder, recurrent, moderate (HCC) 09/24/2016   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   Bilateral carpal tunnel syndrome 05/27/2015   Bilateral hand numbness 05/17/2015   Fracture of humerus, proximal, closed 03/23/2013   Lipoma of skin and subcutaneous tissue 12/26/2011   B12 deficiency 02/15/2011    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 ([address]), [place] - [address] [place]) [place] [address] Phone: [phone] Fax: [phone]     Social Drivers of Health (SDOH) Social History: SDOH Screenings   Food Insecurity: No Food Insecurity (12/06/2023)  Housing: Low Risk (12/06/2023)  Transportation Needs: No Transportation Needs (12/06/2023)  Utilities: Not At Risk (12/06/2023)  Alcohol Screen: Low Risk (10/11/2023)  Depression (PHQ2-9): Low Risk (04/10/2024)  Financial Resource Strain: Low Risk (10/11/2023)  Physical Activity: Inactive (10/11/2023)  Social Connections: Socially Isolated (12/06/2023)  Stress: No Stress Concern Present (10/11/2023)  Tobacco Use: Medium Risk (04/10/2024)  Health Literacy: Adequate Health Literacy (12/06/2023)   SDOH Interventions:     Readmission Risk Interventions     No data to display

## 2024-04-15 NOTE — Progress Notes (Signed)
 Mobility Specialist - Progress Note   04/15/24 1600  Mobility  Activity Ambulated with assistance;Stood with assistance;Respositioned in chair  Level of Assistance Standby assist, set-up cues, supervision of patient - no hands on  Assistive Device None (IV STAND)  Distance Ambulated (ft) 35 ft  Range of Motion/Exercises Active  Activity Response Tolerated well  Mobility visit 1 Mobility  Mobility Specialist Start Time (ACUTE ONLY) 1500  Mobility Specialist Stop Time (ACUTE ONLY) 1518  Mobility Specialist Time Calculation (min) (ACUTE ONLY) 18 min   Pt was on the EOB receiving IV fluids on RA upon entry with guest in the room. Pt agreed to mobility. Pt is able today to STS independently with no AD. Pt ambulated well within the room. Pt O2 vitals were taken throughout activity as a precaution. Pt O2 vitals and HR remained WNL. After activity pt returned to the EOB with needs in reach.  Clem Rodes Mobility Specialist 04/15/2024, 4:15 PM

## 2024-04-15 NOTE — Plan of Care (Signed)

## 2024-04-15 NOTE — Care Management Important Message (Signed)
 Important Message  Patient Details  Name: Elizabeth [name] MRN: [id number] Date of Birth: [birth year]   Important Message Given:  Yes - Medicare IM     Mashelle Busick W, CMA 04/15/2024, 2:50 PM

## 2024-04-15 NOTE — Progress Notes (Signed)
 " PROGRESS NOTE    Elizabeth [name]  FMW:[id number] DOB: Aug 22, 1950 DOA: 04/13/2024 PCP: Edman Marsa PARAS, DO    Brief Narrative:  74 year old female with PMHx of A-fib on Eliquis , COPD, on 2 L O2 at nighttime, factor XI deficiency, IDA, prior stroke, HTN, HLD, T2DM, LLL lung cancer s/p SBRT , MDD who presented to the ED on 04/13/2024 complaining of several weeks of generalized weakness, worsening cough, shortness of breath, and recurrent fevers despite outpatient oral antibiotics. Was seen at PCP's office on 1/9 for similar complaints, with CT scan then showing RLL showed tree-in-bud nodularity in the RLL concerning for infectious or inflammatory bronchiolitis or changes of aspiration, with no lobar pneumonia or pleural effusion noted, and similar small region of consolidation in the anterobasal LLL representing scarring. She received IM rocephin  1 g and started on Cefdinir  300 mg BID, azithromycin  for 5 days was prescribed but not started due to QTc prolongation.    Assessment and Plan:  # Community-acquired pneumonia - Presented with exertional dyspnea, fevers, and cough not improving with oral antibiotics. Recent CT chest (1/9) showed RLL tree-in-bud nodularity concerning for infectious or inflammatory bronchiolitis or changes of aspiration without lobar pneumonia or pleural effusion.  - Procalcitonin 0.2 - RVP negative - Fungitell beta-D glucan pending - Strep pnuemoni and [place] urine antigen pending - Currently afebrile, no leukocytosis. On room air. - Plan was IV rocephin  and PO doxycycline . Patient and daughter insist PO doxycycline  doesn't work for the patient as she's received it a lot in the past. Discussed with PharmD okay to just cover with IV Rocephin . Pulm evaluated patient and switched patient to just PO doxycycline . Patient reports allergy to Augmentin  and fluoroquinolones limiting p.o. options  # Hypokalemia  - K 3.0 - Repleted IV KCl 40 mEq and PO KCl 40 mEq  #  Concern for pinworm infection - Reports seeing white worms in feces and requests testing - O/P stool study ordered - Pinworm prep test ordered. Unsure if this is the correct pinworm tape test. Will check with lab. - May just have to treat empirically if testing not available  # Stage Ia LLL lung cancer - Follows with oncology. No biopsy performed - S/p SBRT - Per oncology, needs repeat CT scan in 09/2024 to check for recurrent disease - Outpatient follow up  # HTN - Continue home diltiazem  and Toprol -XL  # Afib - Rate controlled - Continue home diltiazem , Toprol -XL and Eliquis   # COPD - Follows with outpatient pulm - Continue Breo ellipta  and atrovent  nasal spray - PRN Duo nebs  # MDD - Continue home buspar , ability, and effexor -xr  # T2DM - Home meds held - Continue SSI 0-9 AC and 0-5 HS  # GERD - Continue PPI  Scheduled Meds:  apixaban   5 mg Oral BID   ARIPiprazole   5 mg Oral Daily   busPIRone   5 mg Oral TID   diltiazem   120 mg Oral Daily   fluticasone  furoate-vilanterol  1 puff Inhalation Daily   insulin  aspart  0-5 Units Subcutaneous QHS   insulin  aspart  0-9 Units Subcutaneous TID WC   ipratropium  2 spray Each Nare QID   metoprolol  succinate  25 mg Oral Daily   pantoprazole   40 mg Oral Daily   rosuvastatin   40 mg Oral Daily   sodium chloride  flush  3 mL Intravenous Q12H   torsemide   20 mg Oral BID   venlafaxine  XR  75 mg Oral Q breakfast   Continuous Infusions:  cefTRIAXone  (  ROCEPHIN )  IV Stopped (04/14/24 1856)   doxycycline  (VIBRAMYCIN ) IV 100 mg (04/14/24 2255)   PRN Meds:.acetaminophen  **OR** acetaminophen , albuterol , hydrOXYzine , senna-docusate  Current Outpatient Medications  Medication Instructions   albuterol  (PROVENTIL ) (2.5 MG/3ML) 0.083% nebulizer solution SMARTSIG:1 Vial(s) 4 Times Daily   albuterol  (VENTOLIN  HFA) 108 (90 Base) MCG/ACT inhaler INHALE 2 PUFFS BY MOUTH EVERY 6 HOURS AS NEEDED FOR WHEEZING OR SHORTNESS OF BREATH   apixaban   (ELIQUIS ) 5 mg, Oral, 2 times daily   ARIPiprazole  (ABILIFY ) 5 mg, Oral, Daily   azithromycin  (ZITHROMAX  Z-PAK) 250 MG tablet Take 2 tabs (500mg  total) on Day 1. Take 1 tab (250mg ) daily for next 4 days.   busPIRone  (BUSPAR ) 5 mg, Oral, 3 times daily   cefdinir  (OMNICEF ) 300 mg, Oral, 2 times daily   cyanocobalamin  (VITAMIN B12) 1000 MCG/ML injection INJECT  1ML  INTRAMUSCULARLY ONCE EVERY 30 DAYS   diltiazem  (CARDIZEM  CD) 120 mg   furosemide  (LASIX ) 40 mg   hydrOXYzine  (VISTARIL ) 25 mg, Oral, Every 8 hours PRN   ipratropium (ATROVENT ) 0.06 % nasal spray 2 sprays, Each Nare, 4 times daily   Jardiance 10 mg, Daily   losartan  (COZAAR ) 50 mg, Oral, Daily   MAGnesium -Oxide 400 mg, Oral, Daily   metoprolol  succinate (TOPROL -XL) 25 mg, Oral, Daily, Take with or immediately following a meal.   Multiple Vitamin (MULTI-VITAMINS) TABS Take by mouth.   Nebulizers (PARI PRONEB MAX LC SPRINT) MISC    omeprazole  (PRILOSEC) 20 MG capsule TAKE 1 CAPSULE BY MOUTH ONCE DAILY BEFORE BREAKFAST   potassium chloride  SA (KLOR-CON  M20) 20 MEQ tablet 20 mEq, Oral, Daily   promethazine -dextromethorphan (PROMETHAZINE -DM) 6.25-15 MG/5ML syrup 5 mLs, Oral, 4 times daily PRN   rosuvastatin  (CRESTOR ) 40 mg, Daily   Rybelsus  3 mg, Oral, Daily before breakfast, 30 min before med or food. For 30 days, then dose will increase.   saccharomyces boulardii (FLORASTOR) 250 mg, Oral, 2 times daily   SPIRIVA  HANDIHALER 18 MCG CAPS PLACE ONE CAPSULE INTO INHALER AND INHALE ONCE DAILY   spironolactone (ALDACTONE) 25 mg, Daily   SYMBICORT  160-4.5 MCG/ACT inhaler 2 puffs, Inhalation, 2 times daily   torsemide  (DEMADEX ) 20 mg, 2 times daily   venlafaxine  XR (EFFEXOR -XR) 75 MG 24 hr capsule TAKE 3 CAPSULES BY MOUTH ONCE DAILY WITH BREAKFAST    DVT prophylaxis:  apixaban  (ELIQUIS ) tablet 5 mg   Code Status:   Code Status: Full Code  Family Communication: Patient included her daughter via phone during the visit  Disposition Plan:  Discharge with home health PT - Follow Up Recommendations: Home health PT - PT equipment: None recommended by PT OT - Follow Up Recommendations: Home health OT (vs no OT follow up) -    Level of care: Telemetry  Consultants:  Pulmonology  Procedures:  None  Antimicrobials: IV Rocephin  and doxycycline   Subjective: Patient reports feeling better this morning.  She is concerned about having fevers with temp of 100 F.  States she has not responded well to doxycycline  and that it does not work for her and has allergies to the fluoroquinolones and Augmentin .  She also reports that she was told she can get a pinworm test in the ED but states that hasn't happened. Says she's seeing white worms in her stool.   Objective: Vitals:   04/14/24 1812 04/14/24 2002 04/14/24 2330 04/15/24 0330  BP: (!) 150/93 (!) 152/79 (!) 146/74 105/64  Pulse: 92 90 98 82  Resp: 20 19 18    Temp: 99.6 F (37.6  C) 99 F (37.2 C) 99 F (37.2 C) 98.7 F (37.1 C)  TempSrc: Oral Oral Oral Oral  SpO2: 96% 94% 97% 97%  Weight:      Height:        Intake/Output Summary (Last 24 hours) at 04/15/2024 0456 Last data filed at 04/14/2024 1858 Gross per 24 hour  Intake 271.89 ml  Output --  Net 271.89 ml   Filed Weights   04/13/24 1551 04/13/24 1555 04/13/24 2027  Weight: 87.5 kg 87.5 kg 91.3 kg    Examination:  Gen: NAD, A&Ox3 HEENT: NCAT, EOMI Neck: Supple, no JVD, no LAD CV: RRR, no murmurs Resp: normal WOB, mild rhonchi at bilateral bases, on 2L [place] Abd: Soft, NTND, no guarding, BS normoactive Ext: No LE edema Skin: Warm, dry Neuro: CN II-XII grossly intact, strength 5/5 b/l Psych: Calm, cooperative, appropriate affect    Data Reviewed: I have personally reviewed following labs and imaging studies  CBC: Recent Labs  Lab 04/10/24 1140 04/13/24 1601 04/14/24 0350  WBC 8.4 8.2 6.9  NEUTROABS 5,578  --   --   HGB 11.4* 11.5* 10.6*  HCT 35.4* 34.7* 33.0*  MCV 85.1 83.0 84.6  PLT 297 295 282    Basic Metabolic Panel: Recent Labs  Lab 04/10/24 1140 04/13/24 1601 04/13/24 2028 04/14/24 0350  NA 140 135  --  138  K 3.0* 2.5*  --  3.1*  CL 98 95*  --  102  CO2 30 28  --  26  GLUCOSE 117* 142*  --  119*  BUN 11 10  --  10  CREATININE 0.99 1.08*  --  0.97  CALCIUM  8.6 9.0  --  8.2*  MG  --   --  2.0  --    GFR: Estimated Creatinine Clearance: 53.5 mL/min (by C-G formula based on SCr of 0.97 mg/dL). Liver Function Tests: No results for input(s): AST, ALT, ALKPHOS, BILITOT, PROT, ALBUMIN in the last 168 hours. No results for input(s): LIPASE, AMYLASE in the last 168 hours. No results for input(s): AMMONIA in the last 168 hours. Coagulation Profile: Recent Labs  Lab 04/13/24 1601  INR 1.0   Cardiac Enzymes: No results for input(s): CKTOTAL, CKMB, CKMBINDEX, TROPONINI in the last 168 hours. BNP (last 3 results) No results for input(s): PROBNP in the last 8760 hours. HbA1C: No results for input(s): HGBA1C in the last 72 hours. CBG: Recent Labs  Lab 04/14/24 1327 04/14/24 1715  GLUCAP 175* 155*   Lipid Profile: No results for input(s): CHOL, HDL, LDLCALC, TRIG, CHOLHDL, LDLDIRECT in the last 72 hours. Thyroid  Function Tests: No results for input(s): TSH, T4TOTAL, FREET4, T3FREE, THYROIDAB in the last 72 hours. Anemia Panel: No results for input(s): VITAMINB12, FOLATE, FERRITIN, TIBC, IRON , RETICCTPCT in the last 72 hours. Sepsis Labs: Recent Labs  Lab 04/14/24 0350  PROCALCITON 0.20    Recent Results (from the past 240 hours)  Resp panel by RT-PCR (RSV, Flu A&B, Covid) Anterior Nasal Swab     Status: None   Collection Time: 04/06/24 10:31 AM   Specimen: Anterior Nasal Swab  Result Value Ref Range Status   SARS Coronavirus 2 by RT PCR NEGATIVE NEGATIVE Final    Comment: (NOTE) SARS-CoV-2 target nucleic acids are NOT DETECTED.  The SARS-CoV-2 RNA is generally detectable in upper  respiratory specimens during the acute phase of infection. The lowest concentration of SARS-CoV-2 viral copies this assay can detect is 138 copies/mL. A negative result does not preclude SARS-Cov-2 infection and should  not be used as the sole basis for treatment or other patient management decisions. A negative result may occur with  improper specimen collection/handling, submission of specimen other than nasopharyngeal swab, presence of viral mutation(s) within the areas targeted by this assay, and inadequate number of viral copies(<138 copies/mL). A negative result must be combined with clinical observations, patient history, and epidemiological information. The expected result is Negative.  Fact Sheet for Patients:  bloggercourse.com  Fact Sheet for Healthcare Providers:  seriousbroker.it  This test is no t yet approved or cleared by the [place]  FDA and  has been authorized for detection and/or diagnosis of SARS-CoV-2 by FDA under an Emergency Use Authorization (EUA). This EUA will remain  in effect (meaning this test can be used) for the duration of the COVID-19 declaration under Section 564(b)(1) of the Act, 21 U.S.C.section 360bbb-3(b)(1), unless the authorization is terminated  or revoked sooner.       Influenza A by PCR NEGATIVE NEGATIVE Final   Influenza B by PCR NEGATIVE NEGATIVE Final    Comment: (NOTE) The Xpert Xpress SARS-CoV-2/FLU/RSV plus assay is intended as an aid in the diagnosis of influenza from Nasopharyngeal swab specimens and should not be used as a sole basis for treatment. Nasal washings and aspirates are unacceptable for Xpert Xpress SARS-CoV-2/FLU/RSV testing.  Fact Sheet for Patients: bloggercourse.com  Fact Sheet for Healthcare Providers: seriousbroker.it  This test is not yet approved or cleared by the [place]  FDA and has been  authorized for detection and/or diagnosis of SARS-CoV-2 by FDA under an Emergency Use Authorization (EUA). This EUA will remain in effect (meaning this test can be used) for the duration of the COVID-19 declaration under Section 564(b)(1) of the Act, 21 U.S.C. section 360bbb-3(b)(1), unless the authorization is terminated or revoked.     Resp Syncytial Virus by PCR NEGATIVE NEGATIVE Final    Comment: (NOTE) Fact Sheet for Patients: bloggercourse.com  Fact Sheet for Healthcare Providers: seriousbroker.it  This test is not yet approved or cleared by the [place]  FDA and has been authorized for detection and/or diagnosis of SARS-CoV-2 by FDA under an Emergency Use Authorization (EUA). This EUA will remain in effect (meaning this test can be used) for the duration of the COVID-19 declaration under Section 564(b)(1) of the Act, 21 U.S.C. section 360bbb-3(b)(1), unless the authorization is terminated or revoked.  Performed at [facility], [address] [place], [place] [address]   Resp panel by RT-PCR (RSV, Flu A&B, Covid) Anterior Nasal Swab     Status: None   Collection Time: 04/13/24  8:28 PM   Specimen: Anterior Nasal Swab  Result Value Ref Range Status   SARS Coronavirus 2 by RT PCR NEGATIVE NEGATIVE Final    Comment: (NOTE) SARS-CoV-2 target nucleic acids are NOT DETECTED.  The SARS-CoV-2 RNA is generally detectable in upper respiratory specimens during the acute phase of infection. The lowest concentration of SARS-CoV-2 viral copies this assay can detect is 138 copies/mL. A negative result does not preclude SARS-Cov-2 infection and should not be used as the sole basis for treatment or other patient management decisions. A negative result may occur with  improper specimen collection/handling, submission of specimen other than nasopharyngeal swab, presence of viral mutation(s) within the areas targeted by  this assay, and inadequate number of viral copies(<138 copies/mL). A negative result must be combined with clinical observations, patient history, and epidemiological information. The expected result is Negative.  Fact Sheet for Patients:  bloggercourse.com  Fact Sheet for Healthcare  Providers:  seriousbroker.it  This test is no t yet approved or cleared by the [place]  FDA and  has been authorized for detection and/or diagnosis of SARS-CoV-2 by FDA under an Emergency Use Authorization (EUA). This EUA will remain  in effect (meaning this test can be used) for the duration of the COVID-19 declaration under Section 564(b)(1) of the Act, 21 U.S.C.section 360bbb-3(b)(1), unless the authorization is terminated  or revoked sooner.       Influenza A by PCR NEGATIVE NEGATIVE Final   Influenza B by PCR NEGATIVE NEGATIVE Final    Comment: (NOTE) The Xpert Xpress SARS-CoV-2/FLU/RSV plus assay is intended as an aid in the diagnosis of influenza from Nasopharyngeal swab specimens and should not be used as a sole basis for treatment. Nasal washings and aspirates are unacceptable for Xpert Xpress SARS-CoV-2/FLU/RSV testing.  Fact Sheet for Patients: bloggercourse.com  Fact Sheet for Healthcare Providers: seriousbroker.it  This test is not yet approved or cleared by the [place]  FDA and has been authorized for detection and/or diagnosis of SARS-CoV-2 by FDA under an Emergency Use Authorization (EUA). This EUA will remain in effect (meaning this test can be used) for the duration of the COVID-19 declaration under Section 564(b)(1) of the Act, 21 U.S.C. section 360bbb-3(b)(1), unless the authorization is terminated or revoked.     Resp Syncytial Virus by PCR NEGATIVE NEGATIVE Final    Comment: (NOTE) Fact Sheet for Patients: bloggercourse.com  Fact Sheet  for Healthcare Providers: seriousbroker.it  This test is not yet approved or cleared by the [place]  FDA and has been authorized for detection and/or diagnosis of SARS-CoV-2 by FDA under an Emergency Use Authorization (EUA). This EUA will remain in effect (meaning this test can be used) for the duration of the COVID-19 declaration under Section 564(b)(1) of the Act, 21 U.S.C. section 360bbb-3(b)(1), unless the authorization is terminated or revoked.  Performed at [facility], [address] [place], [place] [address]   Culture, blood (Routine X 2) w Reflex to ID Panel     Status: None (Preliminary result)   Collection Time: 04/13/24  8:28 PM   Specimen: BLOOD  Result Value Ref Range Status   Specimen Description BLOOD BLOOD RIGHT ARM  Final   Special Requests   Final    BOTTLES DRAWN AEROBIC AND ANAEROBIC Blood Culture results may not be optimal due to an inadequate volume of blood received in culture bottles   Culture   Final    NO GROWTH < 12 HOURS Performed at [facility], [address], [place], [place] [address]    Report Status PENDING  Incomplete  Culture, blood (Routine X 2) w Reflex to ID Panel     Status: None (Preliminary result)   Collection Time: 04/13/24  8:30 PM   Specimen: BLOOD  Result Value Ref Range Status   Specimen Description BLOOD BLOOD LEFT ARM  Final   Special Requests   Final    BOTTLES DRAWN AEROBIC AND ANAEROBIC Blood Culture results may not be optimal due to an inadequate volume of blood received in culture bottles   Culture   Final    NO GROWTH < 12 HOURS Performed at [facility], [address] [place], [place] [address]    Report Status PENDING  Incomplete  Respiratory (~20 pathogens) panel by PCR     Status: None   Collection Time: 04/14/24  4:34 PM   Specimen: Nasopharyngeal Swab; Respiratory  Result Value Ref Range Status  Adenovirus NOT DETECTED NOT DETECTED Final    Coronavirus 229E NOT DETECTED NOT DETECTED Final    Comment: (NOTE) The Coronavirus on the Respiratory Panel, DOES NOT test for the novel  Coronavirus (2019 nCoV)    Coronavirus HKU1 NOT DETECTED NOT DETECTED Final   Coronavirus NL63 NOT DETECTED NOT DETECTED Final   Coronavirus OC43 NOT DETECTED NOT DETECTED Final   Metapneumovirus NOT DETECTED NOT DETECTED Final   Rhinovirus / Enterovirus NOT DETECTED NOT DETECTED Final   Influenza A NOT DETECTED NOT DETECTED Final   Influenza B NOT DETECTED NOT DETECTED Final   Parainfluenza Virus 1 NOT DETECTED NOT DETECTED Final   Parainfluenza Virus 2 NOT DETECTED NOT DETECTED Final   Parainfluenza Virus 3 NOT DETECTED NOT DETECTED Final   Parainfluenza Virus 4 NOT DETECTED NOT DETECTED Final   Respiratory Syncytial Virus NOT DETECTED NOT DETECTED Final   Bordetella pertussis NOT DETECTED NOT DETECTED Final   Bordetella Parapertussis NOT DETECTED NOT DETECTED Final   Chlamydophila pneumoniae NOT DETECTED NOT DETECTED Final   Mycoplasma pneumoniae NOT DETECTED NOT DETECTED Final    Comment: Performed at [facility], [address] [place], [place] [address]     Radiology Studies: DG Chest 2 View Result Date: 04/13/2024 CLINICAL DATA:  Shortness of breath EXAM: CHEST - 2 VIEW COMPARISON:  Chest x-ray 04/06/2024, CT 04/10/2024, 03/03/2024, 10/09/2023 FINDINGS: No new consolidation, pleural effusion or pneumothorax. Stable vague left midlung opacity. No pneumothorax IMPRESSION: Stable vague left midlung opacity, corresponding to mild airspace opacity on prior chest CT referenc chest CT 03/03/2024. No new or progressive airspace disease compared to the recent priors Electronically Signed   By: Luke Bun M.D.   On: 04/13/2024 17:30    Scheduled Meds:  apixaban   5 mg Oral BID   ARIPiprazole   5 mg Oral Daily   busPIRone   5 mg Oral TID   diltiazem   120 mg Oral Daily   fluticasone  furoate-vilanterol  1 puff Inhalation Daily   insulin  aspart   0-5 Units Subcutaneous QHS   insulin  aspart  0-9 Units Subcutaneous TID WC   ipratropium  2 spray Each Nare QID   metoprolol  succinate  25 mg Oral Daily   pantoprazole   40 mg Oral Daily   rosuvastatin   40 mg Oral Daily   sodium chloride  flush  3 mL Intravenous Q12H   torsemide   20 mg Oral BID   venlafaxine  XR  75 mg Oral Q breakfast   Continuous Infusions:  cefTRIAXone  (ROCEPHIN )  IV Stopped (04/14/24 1856)   doxycycline  (VIBRAMYCIN ) IV 100 mg (04/14/24 2255)     Unresulted Labs (From admission, onward)     Start     Ordered   04/14/24 0839  Strep pneumoniae urinary antigen  Once,   R        04/14/24 0838   04/14/24 0839  Legionella Pneumophila Serogp 1 Ur Ag  Once,   R        04/14/24 0838   04/14/24 0839  Fungitell Beta-D-Glucan  Once,   R        04/14/24 0838             LOS:  LOS: 2 days   Time Spent: 45 minutes  Buck Mcaffee Al-Sultani, MD [facility]  If 7PM-7AM, please contact night-coverage  04/15/2024, 4:56 AM      "

## 2024-04-15 NOTE — Plan of Care (Signed)

## 2024-04-16 ENCOUNTER — Telehealth: Payer: Self-pay

## 2024-04-16 ENCOUNTER — Other Ambulatory Visit: Payer: Self-pay

## 2024-04-16 DIAGNOSIS — J189 Pneumonia, unspecified organism: Secondary | ICD-10-CM

## 2024-04-16 LAB — GLUCOSE, CAPILLARY: Glucose-Capillary: 214 mg/dL — ABNORMAL HIGH (ref 70–99)

## 2024-04-16 LAB — CBC
HCT: 30.1 % — ABNORMAL LOW (ref 36.0–46.0)
Hemoglobin: 10 g/dL — ABNORMAL LOW (ref 12.0–15.0)
MCH: 27.9 pg (ref 26.0–34.0)
MCHC: 33.2 g/dL (ref 30.0–36.0)
MCV: 83.8 fL (ref 80.0–100.0)
Platelets: 254 K/uL (ref 150–400)
RBC: 3.59 MIL/uL — ABNORMAL LOW (ref 3.87–5.11)
RDW: 14.2 % (ref 11.5–15.5)
WBC: 6.8 K/uL (ref 4.0–10.5)
nRBC: 0 % (ref 0.0–0.2)

## 2024-04-16 LAB — BASIC METABOLIC PANEL WITH GFR
Anion gap: 10 (ref 5–15)
BUN: 12 mg/dL (ref 8–23)
CO2: 27 mmol/L (ref 22–32)
Calcium: 8.7 mg/dL — ABNORMAL LOW (ref 8.9–10.3)
Chloride: 102 mmol/L (ref 98–111)
Creatinine, Ser: 0.98 mg/dL (ref 0.44–1.00)
GFR, Estimated: >60 mL/min
Glucose, Bld: 103 mg/dL — ABNORMAL HIGH (ref 70–99)
Potassium: 3.7 mmol/L (ref 3.5–5.1)
Sodium: 138 mmol/L (ref 135–145)

## 2024-04-16 MED ORDER — POTASSIUM CHLORIDE CRYS ER 20 MEQ PO TBCR
40.0000 meq | EXTENDED_RELEASE_TABLET | Freq: Once | ORAL | Status: AC
  Administered 2024-04-16: 40 meq via ORAL
  Filled 2024-04-16: qty 2

## 2024-04-16 MED ORDER — AMOXICILLIN-POT CLAVULANATE 875-125 MG PO TABS
1.0000 | ORAL_TABLET | Freq: Two times a day (BID) | ORAL | 0 refills | Status: DC
  Filled 2024-04-16: qty 14, 7d supply, fill #0

## 2024-04-16 NOTE — Telephone Encounter (Signed)
 She was discharged from hospital but advised to get repeat chest x-ray. Ordered Chest X-ray for [address] Friday for [place].  Marsa Officer, DO [facility] 04/16/2024, 11:55 AM

## 2024-04-16 NOTE — Progress Notes (Signed)
 "    PULMONOLOGY         Date: 04/16/2024,   MRN# [id number] Barbaraann Avans [birth year]     AdmissionWeight: 87.5 kg                 CurrentWeight: 110.5 kg  Referring provider: Dr Fernand   CHIEF COMPLAINT:   Respiratory distress with hypoxemia   HISTORY OF PRESENT ILLNESS   This is a 74 yo F with hx of AF, Allergic rhinitis, B12 def, COPD, dyslpidemia, lung nodules, substance abuse, QTC prolongation, depression, IDA, lung cancer, OSA, depression who had multiple admissions in past 1 year for Acute exacerbation of COPD. She generally sees [facility] pulmonary for her chronic lung condition.  She uses symbicort , spiriva , albuterol  and nebulizer therapy with good compliance. She lives in facility.  She uses 2L/min [place] for chronic hypoxemia. Had Paxlovid in 22 and 23 for acute COVID infection with hypoxemia.  She still smokes. She had treatment for lung cancer of Left lower lobe but this was presumed diagnosis due to spiculated 9mm nodule.  She has OSA and was supposed to have PSG done.  She has depression.   04/16/24- patient cleared for dc home.  She can be seen on outpatient basis for lung cancer and COPD.    PAST MEDICAL HISTORY   Past Medical History:  Diagnosis Date   A-fib [facility])    Allergy    Anxiety    B12 deficiency    Cancer (HCC)    Carpal tunnel syndrome    Centrilobular emphysema (HCC)    COPD (chronic obstructive pulmonary disease) (HCC)    Depression    Genetic testing    Common Cancers panel (47 genes) @ [facility] - No pathogenic mutations detected   HLD (hyperlipidemia)    Hyperlipidemia    Hypertension    Iron  deficiency anemia 10/16/2016   Lipoma of skin    Lung nodule    Normocytic anemia 10/16/2016   QT prolongation    Substance abuse (HCC)      SURGICAL HISTORY   Past Surgical History:  Procedure Laterality Date   ABDOMINAL HYSTERECTOMY     BREAST EXCISIONAL BIOPSY Left    1980's neg   CESAREAN SECTION     TUBAL LIGATION       FAMILY HISTORY    Family History  Problem Relation Age of Onset   Ovarian cancer Mother 17       deceased 41   Breast cancer Mother 51   Stroke Father    Lung cancer Paternal Aunt    Lung cancer Cousin      SOCIAL HISTORY   Social History[1]   MEDICATIONS    Home Medication:    Current Medication: Current Medications[2]    ALLERGIES   Moxifloxacin, Levofloxacin, and Amoxicillin -pot clavulanate     REVIEW OF SYSTEMS    Review of Systems:  Gen:  Denies  fever, sweats, chills weigh loss  HEENT: Denies blurred vision, double vision, ear pain, eye pain, hearing loss, nose bleeds, sore throat Cardiac:  No dizziness, chest pain or heaviness, chest tightness,edema Resp:   reports dyspnea chronically  Gi: Denies swallowing difficulty, stomach pain, nausea or vomiting, diarrhea, constipation, bowel incontinence Gu:  Denies bladder incontinence, burning urine Ext:   Denies Joint pain, stiffness or swelling Skin: Denies  skin rash, easy bruising or bleeding or hives Endoc:  Denies polyuria, polydipsia , polyphagia or weight change Psych:   Denies depression, insomnia or hallucinations   Other:  All  other systems negative   VS: BP (!) 117/56 (BP Location: Right Arm)   Pulse 92   Temp 99.2 F (37.3 C)   Resp 18   Ht 5' 2 (1.575 m)   Wt 110.5 kg   SpO2 96%   BMI 44.56 kg/m      PHYSICAL EXAM    GENERAL:NAD, no fevers, chills, no weakness no fatigue HEAD: Normocephalic, atraumatic.  EYES: Pupils equal, round, reactive to light. Extraocular muscles intact. No scleral icterus.  MOUTH: Moist mucosal membrane. Dentition intact. No abscess noted.  EAR, NOSE, THROAT: Clear without exudates. No external lesions.  NECK: Supple. No thyromegaly. No nodules. No JVD.  PULMONARY: decreased breath sounds with mild rhonchi worse at bases bilaterally.  CARDIOVASCULAR: S1 and S2. Regular rate and rhythm. No murmurs, rubs, or gallops. No edema. Pedal pulses 2+ bilaterally.   GASTROINTESTINAL: Soft, nontender, nondistended. No masses. Positive bowel sounds. No hepatosplenomegaly.  MUSCULOSKELETAL: No swelling, clubbing, or edema. Range of motion full in all extremities.  NEUROLOGIC: Cranial nerves II through XII are intact. No gross focal neurological deficits. Sensation intact. Reflexes intact.  SKIN: No ulceration, lesions, rashes, or cyanosis. Skin warm and dry. Turgor intact.  PSYCHIATRIC: Mood, affect within normal limits. The patient is awake, alert and oriented x 3. Insight, judgment intact.       IMAGING     Narrative & Impression  EXAM: CT CHEST WITHOUT CONTRAST 04/10/2024 12:40:05 PM   TECHNIQUE: CT of the chest was performed without the administration of intravenous contrast. Multiplanar reformatted images are provided for review. Automated exposure control, iterative reconstruction, and/or weight based adjustment of the mA/kV was utilized to reduce the radiation dose to as low as reasonably achievable.   COMPARISON: 03/03/2024   CLINICAL HISTORY: Pneumonia, complication suspected, xray done; interval left lower lobe opacity follow-up, fever symptoms of pneumonia.   FINDINGS:   MEDIASTINUM: Calcified atherosclerosis in the LAD. Heart and pericardium are otherwise unremarkable. The central airways are clear.   LYMPH NODES: No mediastinal, hilar or axillary lymphadenopathy.   LUNGS AND PLEURA: Similar small region of consolidation in the anterobasal left lower lobe. , likely scarring Tree in bud nodularity in the right lower lobe. Unchanged 3 mm right upper lobe nodule (axial 38). 2 mm subpleural nodule in the superior segment of the right lower lobe (axial 43). 2 mm lateral nodule in the superior segment of the right lower lobe (axial 48). Minimal centrilobular emphysema. 3 mm nodule in the right lung apex (axial 21). 3 mm nodule in the subpleural left lower lobe (axial 111). No pleural effusion or pneumothorax.   SOFT  TISSUES/BONES: No acute abnormality of the bones or soft tissues. Left-sided rib fractures are again noted. Osteopenia. Multilevel degenerative disc disease of the spine.   UPPER ABDOMEN: Cholecystolithiasis. No changes of acute cholecystitis.   IMPRESSION: 1. Tree-in-bud nodularity in the right lower lobe, likely representing an infectious or inflammatory bronchiolitis or changes of aspiration. No lobar pneumonia or pleural effusion. 2. Similar small region of consolidation in the anterobasal left lower lobe, likely representing scarring. 3. Scattered 2 to 3 mm nodules again noted throughout both lungs, unchanged. Attention on routine oncologic follow-up recommended. 4. Cholecystolithiasis.   Electronically signed by: Rogelia Myers MD MD 04/10/2024 02:32 PM EST RP Workstation: [id number]    ASSESSMENT/PLAN      Community acquired pneumonia On Rocephin  IV -doing well clinically  -reviewed chest imaging - mild RLL infiltrate and old left lung lesion where cancer was treated with left perifissural  post treatment radiation changes -currently on ambient air with no dyspnea at rest - recommend PT/OT and dc planning -transitioning to PO antibiotics with doxycycline  BID     COPD with centrilobular emphysema        Restart home spiriva  and symbicort  with PRN abluterol     - IS and flutter valve    - PT/OT    Hx of left lung cancer    - current CT with expected post radiation changes without signs of recurrence     - no adenopathy noted on this noncontrasted study dated 04/10/24    Chronic hypoxemic respiratory failure    - continue PT/OT    - continue weight loss, patient currently on rybelsus .  If patient can achieve 35lb weight loss I believe she will be able to come off oxygen     - pulmonary rehab lung works program for COPD    - continue 2L/min [place] with exertion and nocturnally for now   Morbid obesity     BMI >36    - recommendation for weight reduction    - goal  BMI <30,  continue Rybelsus    - lifestyle modification , calorie restriction intermittent fasting    -contributing to development of hypoxemia      Bibasilar atelectasis    - consider deep breathing exercises     - incentive spirometry     - chest physiotherapy, recruitment maneuvers with metaneb therapy    Hypokalemia and Acute kidney injury       - likely due to BID demadex        - have dcd this therapy for now      Thank you for allowing me to participate in the care of this patient.   Patient/Family are satisfied with care plan and all questions have been answered.    Provider disclosure: Patient with at least one acute or chronic illness or injury that poses a threat to life or bodily function and is being managed actively during this encounter.  All of the below services have been performed independently by signing provider:  review of prior documentation from internal and or external health records.  Review of previous and current lab results.  Interview and comprehensive assessment during patient visit today. Review of current and previous chest radiographs/CT scans. Discussion of management and test interpretation with health care team and patient/family.   This document was prepared using Dragon voice recognition software and may include unintentional dictation errors.     Tamecca Artiga, M.D.  Division of Pulmonary & Critical Care Medicine              [1]  Social History Tobacco Use   Smoking status: Former    Current packs/day: 0.00    Average packs/day: 0.5 packs/day for 51.9 years (25.9 ttl pk-yrs)    Types: Cigarettes    Start date: 12/02/1971    Quit date: 10/18/2023    Years since quitting: 0.4   Smokeless tobacco: Never   Tobacco comments:    Quit smoking 10/18/2023        Started smoking at 74 years old.    Smoked 1.5PPD at her heaviest  Vaping Use   Vaping status: Some Days  Substance Use Topics   Alcohol use: No   Drug use: No  [2]   Current Facility-Administered Medications:    acetaminophen  (TYLENOL ) tablet 650 mg, 650 mg, Oral, Q6H PRN, 650 mg at 04/15/24 1225 **OR** acetaminophen  (TYLENOL ) suppository 650 mg, 650 mg, Rectal,  Q6H PRN, Fernand Prost, MD   albuterol  (PROVENTIL ) (2.5 MG/3ML) 0.083% nebulizer solution 2.5 mg, 2.5 mg, Nebulization, Q4H PRN, Sreenath, Sudheer B, MD   amoxicillin -clavulanate (AUGMENTIN ) 875-125 MG per tablet 1 tablet, 1 tablet, Oral, Q12H, Al-Sultani, Anmar, MD, 1 tablet at 04/15/24 2119   apixaban  (ELIQUIS ) tablet 5 mg, 5 mg, Oral, BID, Khan, Ghalib, MD, 5 mg at 04/15/24 2119   ARIPiprazole  (ABILIFY ) tablet 5 mg, 5 mg, Oral, Daily, Sreenath, Sudheer B, MD, 5 mg at 04/15/24 0945   busPIRone  (BUSPAR ) tablet 5 mg, 5 mg, Oral, TID, Sreenath, Sudheer B, MD, 5 mg at 04/15/24 1646   diltiazem  (CARDIZEM  CD) 24 hr capsule 120 mg, 120 mg, Oral, Daily, Sreenath, Sudheer B, MD, 120 mg at 04/15/24 0945   fluticasone  furoate-vilanterol (BREO ELLIPTA ) 100-25 MCG/ACT 1 puff, 1 puff, Inhalation, Daily, Sreenath, Sudheer B, MD   hydrOXYzine  (ATARAX ) tablet 25 mg, 25 mg, Oral, Q8H PRN, Sreenath, Sudheer B, MD, 25 mg at 04/15/24 0948   insulin  aspart (novoLOG ) injection 0-5 Units, 0-5 Units, Subcutaneous, QHS, Sreenath, Sudheer B, MD, 2 Units at 04/15/24 2124   insulin  aspart (novoLOG ) injection 0-9 Units, 0-9 Units, Subcutaneous, TID WC, Sreenath, Sudheer B, MD, 1 Units at 04/15/24 1645   ipratropium (ATROVENT ) 0.06 % nasal spray 2 spray, 2 spray, Each Nare, QID, Sreenath, Sudheer B, MD, 2 spray at 04/15/24 1826   metoprolol  succinate (TOPROL -XL) 24 hr tablet 25 mg, 25 mg, Oral, Daily, Sreenath, Sudheer B, MD, 25 mg at 04/15/24 0946   pantoprazole  (PROTONIX ) EC tablet 40 mg, 40 mg, Oral, Daily, Sreenath, Sudheer B, MD, 40 mg at 04/15/24 0813   potassium chloride  SA (KLOR-CON  M) CR tablet 40 mEq, 40 mEq, Oral, Once, Al-Sultani, Anmar, MD   rosuvastatin  (CRESTOR ) tablet 40 mg, 40 mg, Oral, Daily, Sreenath, Sudheer B,  MD, 40 mg at 04/15/24 9185   senna-docusate (Senokot-S) tablet 1 tablet, 1 tablet, Oral, QHS PRN, Fernand Prost, MD, 1 tablet at 04/15/24 2119   sodium chloride  flush (NS) 0.9 % injection 3 mL, 3 mL, Intravenous, Q12H, Fernand Prost, MD, 3 mL at 04/15/24 0818   venlafaxine  XR (EFFEXOR -XR) 24 hr capsule 75 mg, 75 mg, Oral, Q breakfast, Sreenath, Sudheer B, MD, 75 mg at 04/15/24 0813  "

## 2024-04-16 NOTE — Telephone Encounter (Signed)
 Copied from CRM #[id number]. Topic: Clinical - Request for Lab/Test Order >> Apr 16, 2024 11:17 AM Elizabeth [name] wrote: Reason for CRM: Patients daughter Elizabeth [name] states the patient is at the hospital for pneumonia but from being in the hospital has contracted something respiratory and advised she needed to contact her PCP and have a chest x-ray done tomorrow. Is requesting an order be put in.  Elizabeth [name] can be reached at [phone]

## 2024-04-16 NOTE — TOC Transition Note (Signed)
 Transition of Care [facility]) - Discharge Note   Patient Details  Name: Cleota Pellerito MRN: [id number] Date of Birth: 1950-12-01  Transition of [facility]) CM/SW Contact:  Nathanael CHRISTELLA Ring, RN Phone Number: 04/16/2024, 11:24 AM   Clinical Narrative:    Patient medically cleared for discharge home.  Larraine with [facility] notified of DC and home health orders are in.  Meds will be delivered to the bedside.    Final next level of care: Home w Home Health Services Barriers to Discharge: Barriers Resolved   Patient Goals and CMS Choice   CMS Medicare.gov Compare Post Acute Care list provided to:: Patient Choice offered to / list presented to : Patient      Discharge Placement                       Discharge Plan and Services Additional resources added to the After Visit Summary for     Discharge Planning Services: CM Consult Post Acute Care Choice: Home Health          DME Arranged: N/A         HH Arranged: RN, PT, OT HH Agency: [facility] Date [facility] Agency Contacted: 04/16/24 Time HH Agency Contacted: [id number] Representative spoke with at [facility] Agency: Larraine  Social Drivers of Health (SDOH) Interventions SDOH Screenings   Food Insecurity: No Food Insecurity (12/06/2023)  Housing: Low Risk (12/06/2023)  Transportation Needs: No Transportation Needs (12/06/2023)  Utilities: Not At Risk (12/06/2023)  Alcohol Screen: Low Risk (10/11/2023)  Depression (PHQ2-9): Low Risk (04/10/2024)  Financial Resource Strain: Low Risk (10/11/2023)  Physical Activity: Inactive (10/11/2023)  Social Connections: Socially Isolated (12/06/2023)  Stress: No Stress Concern Present (10/11/2023)  Tobacco Use: Medium Risk (04/10/2024)  Health Literacy: Adequate Health Literacy (12/06/2023)     Readmission Risk Interventions     No data to display

## 2024-04-16 NOTE — Progress Notes (Signed)
SATURATION QUALIFICATIONS: (This note is used to comply with regulatory documentation for home oxygen)  Patient Saturations on Room Air at Rest = 90%  Patient Saturations on Room Air while Ambulating = 88%    

## 2024-04-16 NOTE — Progress Notes (Incomplete)
 " PROGRESS NOTE    Elizabeth [name]  FMW:[id number] DOB: September 27, 1950 DOA: 04/13/2024 PCP: Edman Marsa PARAS, DO    Brief Narrative:  74 year old female with PMHx of A-fib on Eliquis , COPD, on 2 L O2 at nighttime, HFpEF, factor XI deficiency, IDA, prior stroke, HTN, HLD, T2DM, LLL lung cancer s/p SBRT , MDD who presented to the ED on 04/13/2024 complaining of several weeks of generalized weakness, worsening cough, shortness of breath, and recurrent fevers despite outpatient oral antibiotics. Was seen at PCP's office on 1/9 for similar complaints, with CT scan then showing RLL showed tree-in-bud nodularity in the RLL concerning for infectious or inflammatory bronchiolitis or changes of aspiration, with no lobar pneumonia or pleural effusion noted, and similar small region of consolidation in the anterobasal LLL representing scarring. She received IM rocephin  1 g and started on Cefdinir  300 mg BID, azithromycin  for 5 days was prescribed but not started due to QTc prolongation.    Assessment and Plan:  # Community-acquired pneumonia - Presented with exertional dyspnea, fevers, and cough not improving with oral antibiotics. Recent CT chest (1/9) showed RLL tree-in-bud nodularity concerning for infectious or inflammatory bronchiolitis or changes of aspiration without lobar pneumonia or pleural effusion.  - Procalcitonin 0.2 - RVP negative - Strep pnuemoniae negative - Fungitell beta-D glucan pending - Legionalla urine antigen pending - Currently afebrile, no leukocytosis. On room air during the day. - Plan was IV rocephin  and PO doxycycline . Patient and daughter insist PO doxycycline  doesn't work for the patient as she's received it a lot in the past. Discussed with PharmD okay to just cover with IV Rocephin . Pulm evaluated patient and switched patient to just PO doxycycline . Patient reports allergy to Augmentin  and fluoroquinolones limiting p.o. options. Augmentin  allergy listed as nausea/diarrhea.  Discussed with patient again who stated she is open to trying it and prefers taking it with food.  - Continue PO Augmentin  for total *** days  # Hypokalemia - resolved  # Concern for pinworm infection - Testing is send out and pinworm tape test not routinely done in-house per lab staff - Patient denies experiencing intense nighttime perianal itching - Recommend outpatient follow with PCP if symptoms/concerns persist  # Stage Ia LLL lung cancer - Follows with oncology. No biopsy performed - S/p SBRT - Per oncology, needs repeat CT scan in 09/2024 to check for recurrent disease - Outpatient follow up  # HFpEF - Seen by outpatient cardiology in 03/2024 by Dr. Florencio at which time patient was started on Jardiance 10, spironolactone 25, torsemide  20 twice daily, with discontinuation of potassium and Lasix  -   # HTN - Continue home diltiazem  and Toprol -XL  # Afib - Rate controlled - Continue home diltiazem , Toprol -XL and Eliquis   # COPD - Follows with outpatient pulm - Continue Breo ellipta  and atrovent  nasal spray - PRN Duo nebs  # MDD - Continue home buspar , ability, and effexor -xr  # T2DM - Home meds held - Continue SSI 0-9 AC and 0-5 HS  # GERD - Continue PPI  # OSA - On nocturnal O2  - Recommend outpatient PSG   # Morbid Obesity (BMI Body mass index is 44.56 kg/m. kg/m) - Obesity is clinically significant and complicates care  - Encourage continued discussion with PCP re: lifestyle modification including dietary changes, regular physical activity, and weight reduction strategies.    Scheduled Meds:  amoxicillin -clavulanate  1 tablet Oral Q12H   apixaban   5 mg Oral BID   ARIPiprazole   5 mg Oral Daily  busPIRone   5 mg Oral TID   diltiazem   120 mg Oral Daily   fluticasone  furoate-vilanterol  1 puff Inhalation Daily   insulin  aspart  0-5 Units Subcutaneous QHS   insulin  aspart  0-9 Units Subcutaneous TID WC   ipratropium  2 spray Each Nare QID   metoprolol   succinate  25 mg Oral Daily   pantoprazole   40 mg Oral Daily   rosuvastatin   40 mg Oral Daily   sodium chloride  flush  3 mL Intravenous Q12H   venlafaxine  XR  75 mg Oral Q breakfast   Continuous Infusions:   PRN Meds:.acetaminophen  **OR** acetaminophen , albuterol , hydrOXYzine , senna-docusate  Current Outpatient Medications  Medication Instructions   albuterol  (PROVENTIL ) (2.5 MG/3ML) 0.083% nebulizer solution SMARTSIG:1 Vial(s) 4 Times Daily   albuterol  (VENTOLIN  HFA) 108 (90 Base) MCG/ACT inhaler INHALE 2 PUFFS BY MOUTH EVERY 6 HOURS AS NEEDED FOR WHEEZING OR SHORTNESS OF BREATH   apixaban  (ELIQUIS ) 5 mg, Oral, 2 times daily   ARIPiprazole  (ABILIFY ) 5 mg, Oral, Daily   azithromycin  (ZITHROMAX  Z-PAK) 250 MG tablet Take 2 tabs (500mg  total) on Day 1. Take 1 tab (250mg ) daily for next 4 days.   busPIRone  (BUSPAR ) 5 mg, Oral, 3 times daily   cefdinir  (OMNICEF ) 300 mg, Oral, 2 times daily   cyanocobalamin  (VITAMIN B12) 1000 MCG/ML injection INJECT  1ML  INTRAMUSCULARLY ONCE EVERY 30 DAYS   diltiazem  (CARDIZEM  CD) 120 mg   furosemide  (LASIX ) 40 mg   hydrOXYzine  (VISTARIL ) 25 mg, Oral, Every 8 hours PRN   ipratropium (ATROVENT ) 0.06 % nasal spray 2 sprays, Each Nare, 4 times daily   Jardiance 10 mg, Daily   losartan  (COZAAR ) 50 mg, Oral, Daily   MAGnesium -Oxide 400 mg, Oral, Daily   metoprolol  succinate (TOPROL -XL) 25 mg, Oral, Daily, Take with or immediately following a meal.   Multiple Vitamin (MULTI-VITAMINS) TABS Take by mouth.   Nebulizers (PARI PRONEB MAX LC SPRINT) MISC    omeprazole  (PRILOSEC) 20 MG capsule TAKE 1 CAPSULE BY MOUTH ONCE DAILY BEFORE BREAKFAST   potassium chloride  SA (KLOR-CON  M20) 20 MEQ tablet 20 mEq, Oral, Daily   promethazine -dextromethorphan (PROMETHAZINE -DM) 6.25-15 MG/5ML syrup 5 mLs, Oral, 4 times daily PRN   rosuvastatin  (CRESTOR ) 40 mg, Daily   Rybelsus  3 mg, Oral, Daily before breakfast, 30 min before med or food. For 30 days, then dose will increase.    saccharomyces boulardii (FLORASTOR) 250 mg, Oral, 2 times daily   SPIRIVA  HANDIHALER 18 MCG CAPS PLACE ONE CAPSULE INTO INHALER AND INHALE ONCE DAILY   spironolactone (ALDACTONE) 25 mg, Daily   SYMBICORT  160-4.5 MCG/ACT inhaler 2 puffs, Inhalation, 2 times daily   torsemide  (DEMADEX ) 20 mg, 2 times daily   venlafaxine  XR (EFFEXOR -XR) 75 MG 24 hr capsule TAKE 3 CAPSULES BY MOUTH ONCE DAILY WITH BREAKFAST    DVT prophylaxis:  apixaban  (ELIQUIS ) tablet 5 mg   Code Status:   Code Status: Full Code  Family Communication: Patient included her daughter via phone during the visit  Disposition Plan: Discharge with home health PT - Follow Up Recommendations: Home health PT - PT equipment: None recommended by PT OT - Follow Up Recommendations: Home health OT (vs no OT follow up) -    Level of care: Telemetry  Consultants:  Pulmonology  Procedures:  None  Antimicrobials: IV Rocephin  and doxycycline  - switched to PO augmentin   Subjective: Patient reports feeling better this morning.  She is concerned about having fevers with temp of 100 F.  States she has not  responded well to doxycycline  and that it does not work for her and has allergies to the fluoroquinolones and Augmentin .  She also reports that she was told she can get a pinworm test in the ED but states that hasn't happened. Says she's seeing white worms in her stool. ***  Objective: Vitals:   04/15/24 1929 04/15/24 2351 04/16/24 0404 04/16/24 0458  BP: (!) 150/68 (!) 115/52 (!) 117/56   Pulse: 84 97 92   Resp: 18 18 18    Temp: 98.7 F (37.1 C) 98.9 F (37.2 C) 99.2 F (37.3 C)   TempSrc:      SpO2: 99% 96% 96%   Weight:    110.5 kg  Height:        Intake/Output Summary (Last 24 hours) at 04/16/2024 0739 Last data filed at 04/15/2024 1900 Gross per 24 hour  Intake 660.34 ml  Output --  Net 660.34 ml   Filed Weights   04/13/24 2027 04/15/24 0500 04/16/24 0458  Weight: 91.3 kg 91 kg 110.5 kg    Examination:  Gen:  NAD, A&Ox3 HEENT: NCAT, EOMI Neck: Supple, no JVD, no LAD CV: RRR, no murmurs Resp: normal WOB, mild rhonchi at bilateral bases, on 2L [place] Abd: Soft, NTND, no guarding, BS normoactive Ext: No LE edema Skin: Warm, dry Neuro: CN II-XII grossly intact, strength 5/5 b/l Psych: Calm, cooperative, appropriate affect    Data Reviewed: I have personally reviewed following labs and imaging studies  CBC: Recent Labs  Lab 04/10/24 1140 04/13/24 1601 04/14/24 0350 04/15/24 0532 04/16/24 0533  WBC 8.4 8.2 6.9 7.9 6.8  NEUTROABS 5,578  --   --   --   --   HGB 11.4* 11.5* 10.6* 10.8* 10.0*  HCT 35.4* 34.7* 33.0* 32.9* 30.1*  MCV 85.1 83.0 84.6 83.7 83.8  PLT 297 295 282 293 254   Basic Metabolic Panel: Recent Labs  Lab 04/10/24 1140 04/13/24 1601 04/13/24 2028 04/14/24 0350 04/15/24 0532  NA 140 135  --  138 138  K 3.0* 2.5*  --  3.1* 3.0*  CL 98 95*  --  102 98  CO2 30 28  --  26 27  GLUCOSE 117* 142*  --  119* 127*  BUN 11 10  --  10 10  CREATININE 0.99 1.08*  --  0.97 1.09*  CALCIUM  8.6 9.0  --  8.2* 8.1*  MG  --   --  2.0  --   --    GFR: Estimated Creatinine Clearance: 53.1 mL/min (A) (by C-G formula based on SCr of 1.09 mg/dL (H)). Liver Function Tests: No results for input(s): AST, ALT, ALKPHOS, BILITOT, PROT, ALBUMIN in the last 168 hours. No results for input(s): LIPASE, AMYLASE in the last 168 hours. No results for input(s): AMMONIA in the last 168 hours. Coagulation Profile: Recent Labs  Lab 04/13/24 1601  INR 1.0   Cardiac Enzymes: No results for input(s): CKTOTAL, CKMB, CKMBINDEX, TROPONINI in the last 168 hours. BNP (last 3 results) No results for input(s): PROBNP in the last 8760 hours. HbA1C: No results for input(s): HGBA1C in the last 72 hours. CBG: Recent Labs  Lab 04/14/24 1715 04/15/24 0936 04/15/24 1153 04/15/24 1543 04/15/24 2058  GLUCAP 155* 166* 187* 132* 224*   Lipid Profile: No results for input(s):  CHOL, HDL, LDLCALC, TRIG, CHOLHDL, LDLDIRECT in the last 72 hours. Thyroid  Function Tests: No results for input(s): TSH, T4TOTAL, FREET4, T3FREE, THYROIDAB in the last 72 hours. Anemia Panel: No results for input(s): VITAMINB12,  FOLATE, FERRITIN, TIBC, IRON , RETICCTPCT in the last 72 hours. Sepsis Labs: Recent Labs  Lab 04/14/24 0350  PROCALCITON 0.20    Recent Results (from the past 240 hours)  Resp panel by RT-PCR (RSV, Flu A&B, Covid) Anterior Nasal Swab     Status: None   Collection Time: 04/06/24 10:31 AM   Specimen: Anterior Nasal Swab  Result Value Ref Range Status   SARS Coronavirus 2 by RT PCR NEGATIVE NEGATIVE Final    Comment: (NOTE) SARS-CoV-2 target nucleic acids are NOT DETECTED.  The SARS-CoV-2 RNA is generally detectable in upper respiratory specimens during the acute phase of infection. The lowest concentration of SARS-CoV-2 viral copies this assay can detect is 138 copies/mL. A negative result does not preclude SARS-Cov-2 infection and should not be used as the sole basis for treatment or other patient management decisions. A negative result may occur with  improper specimen collection/handling, submission of specimen other than nasopharyngeal swab, presence of viral mutation(s) within the areas targeted by this assay, and inadequate number of viral copies(<138 copies/mL). A negative result must be combined with clinical observations, patient history, and epidemiological information. The expected result is Negative.  Fact Sheet for Patients:  bloggercourse.com  Fact Sheet for Healthcare Providers:  seriousbroker.it  This test is no t yet approved or cleared by the [place]  FDA and  has been authorized for detection and/or diagnosis of SARS-CoV-2 by FDA under an Emergency Use Authorization (EUA). This EUA will remain  in effect (meaning this test can be used) for the  duration of the COVID-19 declaration under Section 564(b)(1) of the Act, 21 U.S.C.section 360bbb-3(b)(1), unless the authorization is terminated  or revoked sooner.       Influenza A by PCR NEGATIVE NEGATIVE Final   Influenza B by PCR NEGATIVE NEGATIVE Final    Comment: (NOTE) The Xpert Xpress SARS-CoV-2/FLU/RSV plus assay is intended as an aid in the diagnosis of influenza from Nasopharyngeal swab specimens and should not be used as a sole basis for treatment. Nasal washings and aspirates are unacceptable for Xpert Xpress SARS-CoV-2/FLU/RSV testing.  Fact Sheet for Patients: bloggercourse.com  Fact Sheet for Healthcare Providers: seriousbroker.it  This test is not yet approved or cleared by the [place]  FDA and has been authorized for detection and/or diagnosis of SARS-CoV-2 by FDA under an Emergency Use Authorization (EUA). This EUA will remain in effect (meaning this test can be used) for the duration of the COVID-19 declaration under Section 564(b)(1) of the Act, 21 U.S.C. section 360bbb-3(b)(1), unless the authorization is terminated or revoked.     Resp Syncytial Virus by PCR NEGATIVE NEGATIVE Final    Comment: (NOTE) Fact Sheet for Patients: bloggercourse.com  Fact Sheet for Healthcare Providers: seriousbroker.it  This test is not yet approved or cleared by the [place]  FDA and has been authorized for detection and/or diagnosis of SARS-CoV-2 by FDA under an Emergency Use Authorization (EUA). This EUA will remain in effect (meaning this test can be used) for the duration of the COVID-19 declaration under Section 564(b)(1) of the Act, 21 U.S.C. section 360bbb-3(b)(1), unless the authorization is terminated or revoked.  Performed at [facility], [address] [place], [place] [address]   Resp panel by RT-PCR (RSV, Flu A&B, Covid) Anterior Nasal  Swab     Status: None   Collection Time: 04/13/24  8:28 PM   Specimen: Anterior Nasal Swab  Result Value Ref Range Status   SARS Coronavirus 2 by RT PCR NEGATIVE NEGATIVE Final  Comment: (NOTE) SARS-CoV-2 target nucleic acids are NOT DETECTED.  The SARS-CoV-2 RNA is generally detectable in upper respiratory specimens during the acute phase of infection. The lowest concentration of SARS-CoV-2 viral copies this assay can detect is 138 copies/mL. A negative result does not preclude SARS-Cov-2 infection and should not be used as the sole basis for treatment or other patient management decisions. A negative result may occur with  improper specimen collection/handling, submission of specimen other than nasopharyngeal swab, presence of viral mutation(s) within the areas targeted by this assay, and inadequate number of viral copies(<138 copies/mL). A negative result must be combined with clinical observations, patient history, and epidemiological information. The expected result is Negative.  Fact Sheet for Patients:  bloggercourse.com  Fact Sheet for Healthcare Providers:  seriousbroker.it  This test is no t yet approved or cleared by the [place]  FDA and  has been authorized for detection and/or diagnosis of SARS-CoV-2 by FDA under an Emergency Use Authorization (EUA). This EUA will remain  in effect (meaning this test can be used) for the duration of the COVID-19 declaration under Section 564(b)(1) of the Act, 21 U.S.C.section 360bbb-3(b)(1), unless the authorization is terminated  or revoked sooner.       Influenza A by PCR NEGATIVE NEGATIVE Final   Influenza B by PCR NEGATIVE NEGATIVE Final    Comment: (NOTE) The Xpert Xpress SARS-CoV-2/FLU/RSV plus assay is intended as an aid in the diagnosis of influenza from Nasopharyngeal swab specimens and should not be used as a sole basis for treatment. Nasal washings and aspirates  are unacceptable for Xpert Xpress SARS-CoV-2/FLU/RSV testing.  Fact Sheet for Patients: bloggercourse.com  Fact Sheet for Healthcare Providers: seriousbroker.it  This test is not yet approved or cleared by the [place]  FDA and has been authorized for detection and/or diagnosis of SARS-CoV-2 by FDA under an Emergency Use Authorization (EUA). This EUA will remain in effect (meaning this test can be used) for the duration of the COVID-19 declaration under Section 564(b)(1) of the Act, 21 U.S.C. section 360bbb-3(b)(1), unless the authorization is terminated or revoked.     Resp Syncytial Virus by PCR NEGATIVE NEGATIVE Final    Comment: (NOTE) Fact Sheet for Patients: bloggercourse.com  Fact Sheet for Healthcare Providers: seriousbroker.it  This test is not yet approved or cleared by the [place]  FDA and has been authorized for detection and/or diagnosis of SARS-CoV-2 by FDA under an Emergency Use Authorization (EUA). This EUA will remain in effect (meaning this test can be used) for the duration of the COVID-19 declaration under Section 564(b)(1) of the Act, 21 U.S.C. section 360bbb-3(b)(1), unless the authorization is terminated or revoked.  Performed at [facility], [address] [place], [place] [address]   Culture, blood (Routine X 2) w Reflex to ID Panel     Status: None (Preliminary result)   Collection Time: 04/13/24  8:28 PM   Specimen: BLOOD  Result Value Ref Range Status   Specimen Description BLOOD BLOOD RIGHT ARM  Final   Special Requests   Final    BOTTLES DRAWN AEROBIC AND ANAEROBIC Blood Culture results may not be optimal due to an inadequate volume of blood received in culture bottles   Culture   Final    NO GROWTH 2 DAYS Performed at [facility], [address], [place], [place] [address]    Report Status PENDING  Incomplete   Culture, blood (Routine X 2) w Reflex to ID Panel     Status: None (Preliminary result)  Collection Time: 04/13/24  8:30 PM   Specimen: BLOOD  Result Value Ref Range Status   Specimen Description BLOOD BLOOD LEFT ARM  Final   Special Requests   Final    BOTTLES DRAWN AEROBIC AND ANAEROBIC Blood Culture results may not be optimal due to an inadequate volume of blood received in culture bottles   Culture   Final    NO GROWTH 2 DAYS Performed at [facility], [address] [place], [place] [address]    Report Status PENDING  Incomplete  Respiratory (~20 pathogens) panel by PCR     Status: None   Collection Time: 04/14/24  4:34 PM   Specimen: Nasopharyngeal Swab; Respiratory  Result Value Ref Range Status   Adenovirus NOT DETECTED NOT DETECTED Final   Coronavirus 229E NOT DETECTED NOT DETECTED Final    Comment: (NOTE) The Coronavirus on the Respiratory Panel, DOES NOT test for the novel  Coronavirus (2019 nCoV)    Coronavirus HKU1 NOT DETECTED NOT DETECTED Final   Coronavirus NL63 NOT DETECTED NOT DETECTED Final   Coronavirus OC43 NOT DETECTED NOT DETECTED Final   Metapneumovirus NOT DETECTED NOT DETECTED Final   Rhinovirus / Enterovirus NOT DETECTED NOT DETECTED Final   Influenza A NOT DETECTED NOT DETECTED Final   Influenza B NOT DETECTED NOT DETECTED Final   Parainfluenza Virus 1 NOT DETECTED NOT DETECTED Final   Parainfluenza Virus 2 NOT DETECTED NOT DETECTED Final   Parainfluenza Virus 3 NOT DETECTED NOT DETECTED Final   Parainfluenza Virus 4 NOT DETECTED NOT DETECTED Final   Respiratory Syncytial Virus NOT DETECTED NOT DETECTED Final   Bordetella pertussis NOT DETECTED NOT DETECTED Final   Bordetella Parapertussis NOT DETECTED NOT DETECTED Final   Chlamydophila pneumoniae NOT DETECTED NOT DETECTED Final   Mycoplasma pneumoniae NOT DETECTED NOT DETECTED Final    Comment: Performed at [facility], [address] [place], [place] [address]     Radiology  Studies: No results found.   Scheduled Meds:  amoxicillin -clavulanate  1 tablet Oral Q12H   apixaban   5 mg Oral BID   ARIPiprazole   5 mg Oral Daily   busPIRone   5 mg Oral TID   diltiazem   120 mg Oral Daily   fluticasone  furoate-vilanterol  1 puff Inhalation Daily   insulin  aspart  0-5 Units Subcutaneous QHS   insulin  aspart  0-9 Units Subcutaneous TID WC   ipratropium  2 spray Each Nare QID   metoprolol  succinate  25 mg Oral Daily   pantoprazole   40 mg Oral Daily   rosuvastatin   40 mg Oral Daily   sodium chloride  flush  3 mL Intravenous Q12H   venlafaxine  XR  75 mg Oral Q breakfast   Continuous Infusions:     Unresulted Labs (From admission, onward)     Start     Ordered   04/16/24 0500  Basic metabolic panel with GFR  Tomorrow morning,   R        04/15/24 0512   04/14/24 0839  Legionella Pneumophila Serogp 1 Ur Ag  Once,   R        04/14/24 0838   04/14/24 0839  Fungitell Beta-D-Glucan  Once,   R        04/14/24 0838             LOS:  LOS: 3 days   Time Spent: 45 minutes  Rosibel Giacobbe Al-Sultani, MD [facility]  If 7PM-7AM, please contact night-coverage  04/16/2024, 7:39 AM      "

## 2024-04-16 NOTE — Addendum Note (Signed)
 Addended by: EDMAN MARSA PARAS on: 04/16/2024 11:55 AM   Modules accepted: Orders

## 2024-04-17 ENCOUNTER — Telehealth: Payer: Self-pay

## 2024-04-17 LAB — FUNGITELL BETA-D-GLUCAN: Fungitell Value:: 36.797 pg/mL

## 2024-04-17 LAB — LEGIONELLA PNEUMOPHILA SEROGP 1 UR AG: L. pneumophila Serogp 1 Ur Ag: NEGATIVE

## 2024-04-17 NOTE — Transitions of Care (Post Inpatient/ED Visit) (Signed)
 "  04/17/2024  Name: Elizabeth [name] MRN: [id number] DOB: Mar 13, 1951  Today's TOC FU Call Status: Today's TOC FU Call Status:: Successful TOC FU Call Completed TOC FU Call Complete Date: 04/17/24  Patient's Name and Date of Birth confirmed. Name, DOB  Transition Care Management Follow-up Telephone Call Date of Discharge: 04/16/24 Discharge Facility: [facility]) Type of Discharge: Inpatient Admission Primary Inpatient Discharge Diagnosis:: Community Acquired Pneumonia How have you been since you were released from the hospital?: Same Any questions or concerns?: No  Items Reviewed: Did you receive and understand the discharge instructions provided?: Yes Medications obtained,verified, and reconciled?: Yes (Medications Reviewed) Any new allergies since your discharge?: No Dietary orders reviewed?: NA Do you have support at home?: Yes People in Home [RPT]: child(ren), adult Name of Support/Comfort Primary Source: Elizabeth [name]  Medications Reviewed Today: Medications Reviewed Today     Reviewed by Moises Reusing, RN (Case Manager) on 04/17/24 at 1226  Med List Status: <None>   Medication Order Taking? Sig Documenting Provider Last Dose Status Informant  albuterol  (PROVENTIL ) (2.5 MG/3ML) 0.083% nebulizer solution [id number] No SMARTSIG:1 Vial(s) 4 Times Daily [provider] 04/13/2024 Morning Active Child  albuterol  (VENTOLIN  HFA) 108 (90 Base) MCG/ACT inhaler [id number] No INHALE 2 PUFFS BY MOUTH EVERY 6 HOURS AS NEEDED FOR WHEEZING OR SHORTNESS OF BREATH Karamalegos, [name] PARAS, DO Unknown Active Child  amoxicillin -clavulanate (AUGMENTIN ) 875-125 MG tablet [id number]  Take 1 tablet by mouth every 12 (twelve) hours for 7 days. Al-Sultani, Anmar, MD  Active   apixaban  (ELIQUIS ) 5 MG TABS tablet [id number] No Take 1 tablet (5 mg total) by mouth 2 (two) times daily.  Patient taking differently: Take 5 mg by mouth daily.   Elizabeth [name] HERO, PA-C  04/13/2024 Morning Active Child  ARIPiprazole  (ABILIFY ) 5 MG tablet [id number] No Take 1 tablet by mouth once daily Elizabeth [name] PARAS, DO 04/13/2024 Morning Active Child  busPIRone  (BUSPAR ) 5 MG tablet [id number] No Take 1 tablet (5 mg total) by mouth 3 (three) times daily. Elizabeth [name] HERO, PA-C 04/13/2024 Morning Active Child  cyanocobalamin  (VITAMIN B12) 1000 MCG/ML injection [id number] No INJECT  1ML  INTRAMUSCULARLY ONCE EVERY 30 DAYS Karamalegos, [name] PARAS, DO Past Month Active Child  diltiazem  (CARDIZEM  CD) 120 MG 24 hr capsule [id number] No Take 120 mg by mouth. [provider] 04/13/2024 Morning Active Child  hydrOXYzine  (VISTARIL ) 25 MG capsule [id number] No Take 1 capsule (25 mg total) by mouth every 8 (eight) hours as needed. Elizabeth [name] PARAS, DO Unknown Active Child  ipratropium (ATROVENT ) 0.06 % nasal spray [id number] No Place 2 sprays into both nostrils 4 (four) times daily. Elizabeth Ditch, NP 04/13/2024 Morning Active Child  JARDIANCE 10 MG TABS tablet [id number] No Take 10 mg by mouth daily. [provider] 04/13/2024 Morning Active Child  losartan  (COZAAR ) 50 MG tablet [id number] No Take 1 tablet by mouth once daily Elizabeth [name] PARAS, DO 04/13/2024 Morning Active Child  MAGNESIUM -OXIDE 400 (240 Mg) MG tablet [id number] No Take 1 tablet by mouth once daily Karamalegos, Alexander J, DO 04/13/2024 Morning Active Child  metoprolol  succinate (TOPROL -XL) 25 MG 24 hr tablet [id number] No Take 1 tablet (25 mg total) by mouth daily. Take with or immediately following a meal. Elizabeth [name] PARAS, DO 04/13/2024 Morning Active Child  Multiple Vitamin (MULTI-VITAMINS) TABS [id number] No Take by mouth. [provider] 04/13/2024 Morning Active Child  Nebulizers (PARI PRONEB MAX LC SPRINT) MISC [id number] No   Patient not taking: No sig reported   [provider] Not Taking Active Child  omeprazole  (PRILOSEC) 20 MG capsule [id number] No TAKE 1 CAPSULE BY  MOUTH ONCE DAILY BEFORE BREAKFAST Elizabeth [name] PARAS, DO 04/13/2024 Morning Active Child  promethazine -dextromethorphan (PROMETHAZINE -DM) 6.25-15 MG/5ML syrup [id number] No Take 5 mLs by mouth 4 (four) times daily as needed.  Patient not taking: Reported on 01/29/2024   Elizabeth [name] KATHEE [name] Not Taking Active Child  rosuvastatin  (CRESTOR ) 40 MG tablet [id number] No Take 40 mg by mouth daily. [provider] 04/13/2024 Active Child  RYBELSUS  3 MG TABS [id number] No Take 1 tablet (3 mg total) by mouth daily before breakfast. 30 min before med or food. For 30 days, then dose will increase. Elizabeth [name] PARAS, DO 04/13/2024 Morning Active Child  saccharomyces boulardii (FLORASTOR) 250 MG capsule [id number] No Take 1 capsule (250 mg total) by mouth 2 (two) times daily.  Patient not taking: Reported on 01/29/2024   Elizabeth [name] Elizabeth [name] Not Taking Active Child  SPIRIVA  HANDIHALER 18 MCG CAPS [id number] No PLACE ONE CAPSULE INTO INHALER AND INHALE ONCE DAILY Elizabeth [name] PARAS, DO 04/13/2024 Morning Active Child  spironolactone (ALDACTONE) 25 MG tablet [id number] No Take 25 mg by mouth daily. [provider] 04/13/2024 Morning Active Child  SYMBICORT  160-4.5 MCG/ACT inhaler [id number] No Inhale 2 puffs into the lungs 2 (two) times daily. Elizabeth [name] PARAS, DO 04/13/2024 Morning Active Child  Discontinued 09/17/22 1051   torsemide  (DEMADEX ) 20 MG tablet [id number] No Take 20 mg by mouth 2 (two) times daily. [provider] 04/13/2024 Morning Active Child  venlafaxine  XR (EFFEXOR -XR) 75 MG 24 hr capsule [id number] No TAKE 3 CAPSULES BY MOUTH ONCE DAILY WITH BREAKFAST Karamalegos, Alexander J, DO 04/13/2024 Morning Active Child            Home Care and Equipment/Supplies: Were Home Health Services Ordered?: Yes Name of Home Health Agency:: [facility]. The patient is declining services at this time Has Agency set up a time to come to your home?: No (The patient  declines) EMR reviewed for Home Health Orders: Orders present/patient has not received call (refer to CM for follow-up) Any new equipment or medical supplies ordered?: No  Functional Questionnaire: Do you need assistance with bathing/showering or dressing?: No Do you need assistance with meal preparation?: No Do you need assistance with eating?: No Do you have difficulty maintaining continence: No Do you need assistance with getting out of bed/getting out of a chair/moving?: No Do you have difficulty managing or taking your medications?: No  Follow up appointments reviewed: PCP Follow-up appointment confirmed?: Yes Date of PCP follow-up appointment?: 04/22/24 Follow-up Provider: Dr. Edman Specialist [facility] Follow-up appointment confirmed?: Yes Date of Specialist follow-up appointment?: 05/19/24 Follow-Up Specialty Provider:: Pulmonolgy Do you need transportation to your follow-up appointment?: No Do you understand care options if your condition(s) worsen?: Yes-patient verbalized understanding  SDOH Interventions Today    Flowsheet Row Most Recent Value  SDOH Interventions   Food Insecurity Interventions Intervention Not Indicated  Housing Interventions Intervention Not Indicated  Transportation Interventions Intervention Not Indicated  Utilities Interventions Intervention Not Indicated    Medford Balboa, BSN, RN [place]  VBCI - Population Health RN Care Manager [phone]  "

## 2024-04-18 LAB — CULTURE, BLOOD (ROUTINE X 2)
Culture: NO GROWTH
Culture: NO GROWTH

## 2024-04-18 NOTE — Discharge Summary (Signed)
 " Physician Discharge Summary   Patient: Elizabeth [name] MRN: [id number] DOB: 31-Dec-1950  Admit date:     04/13/2024  Discharge date: 04/16/2024  Discharge Physician: Duffy Al-Sultani   PCP: Edman Marsa PARAS, DO   Recommendations at discharge:   Follow up with PCP within 5 days. Repeat CBC, CMP, and CXR. Monitor K levels. Recommend PSG for OSA. Can be arranged with pulm Follow up with cardiology for CHF Follow up with EP for Afib Follow up with pulm for COPD Follow up with oncology for lung cancer  Discharge Diagnoses: Principal Problem:   Community acquired pneumonia Active Problems:   Benign essential hypertension   Major depressive disorder, recurrent, moderate (HCC)   Mixed hyperlipidemia   OSA on CPAP   Type 2 diabetes mellitus with other specified complication (HCC)   Persistent atrial fibrillation [facility])   Hospital Course: 74 year old female with PMHx of A-fib on Eliquis , COPD, on 2 L O2 at nighttime, HFpEF, factor XI deficiency, IDA, prior stroke, HTN, HLD, T2DM, LLL lung cancer s/p SBRT , MDD who presented to the ED on 04/13/2024 complaining of several weeks of generalized weakness, worsening cough, shortness of breath, and recurrent fevers despite outpatient oral antibiotics. Was seen at PCP's office on 1/9 for similar complaints, with CT scan then showing RLL showed tree-in-bud nodularity in the RLL concerning for infectious or inflammatory bronchiolitis or changes of aspiration, with no lobar pneumonia or pleural effusion noted, and similar small region of consolidation in the anterobasal LLL representing scarring. She received IM rocephin  1 g and started on Cefdinir  300 mg BID, azithromycin  for 5 days was prescribed but not started due to QTc prolongation.   # Community-acquired pneumonia - Presented with exertional dyspnea, fevers, and cough not improving with oral antibiotics. Recent CT chest (1/9) showed RLL tree-in-bud nodularity concerning for infectious or  inflammatory bronchiolitis or changes of aspiration without lobar pneumonia or pleural effusion.  - Procalcitonin 0.2 - RVP negative - Strep pnuemoniae negative - Fungitell beta-D glucan pending - Legionalla urine antigen pending - Currently afebrile, no leukocytosis. On room air during the day. - Plan was IV rocephin  and PO doxycycline . Patient and daughter insist PO doxycycline  doesn't work for the patient as she's received it a lot in the past. Discussed with PharmD okay to just cover with IV Rocephin . Pulm evaluated patient and switched patient to just PO doxycycline . Patient reports allergy to Augmentin  and fluoroquinolones limiting p.o. options. Augmentin  allergy listed as nausea/diarrhea. Discussed with patient again who stated she is open to trying it and prefers taking it with food.  - Prescribed PO Augmentin  for total 7 days on discharge - Follow up with PCP and repeat CXR   # Hypokalemia - resolved  # COPD - Follows with outpatient pulm - Resumed home inhalers on discharge - Has outpatient follow-up with Dr. Malka on 05/19/2024    #Chronic hypoxic respiratory failure - Uses 2L O2 at nighttime and PRN - Was on and off 2L while hospitalized maintaining appropriate SpO2 - Continue home 2L at nighttime and PRN - Follow up with pulm as above  # Concern for pinworm infection - Testing is send out and pinworm tape test not routinely done in-house per lab staff - Patient denies experiencing intense nighttime perianal itching - Recommend outpatient follow with PCP if symptoms/concerns persist   # Stage Ia LLL lung cancer - Follows with oncology. No biopsy performed - S/p SBRT - Per oncology, needs repeat CT scan in 09/2024 to check for recurrent disease -  Follow-up with oncology, Dr. Jacobo, as scheduled   # HFpEF - Seen by outpatient cardiology in 03/2024 by Dr. Florencio at which time patient was started on Jardiance 10, spironolactone 25, torsemide  20 twice daily, with  discontinuation of potassium and Lasix  - Continued home regimen of Jardiance, spironolactone and torsemide  at discharge - Has upcoming follow up with Dr. Florencio on 04/23/2024   # HTN - Continue home diltiazem  and Toprol -XL   # Afib - Rate controlled - Continue home diltiazem , Toprol -XL and Eliquis  - Has upcoming follow-up with EP, Dr. Ezzard, on 05/05/2024    # MDD - Continue home buspar , ability, and effexor -xr   # T2DM - Home meds resumed on discharge   # GERD - Continue PPI   # OSA - On nocturnal O2  - Recommend outpatient PSG    # Morbid Obesity (BMI Body mass index is 44.56 kg/m. kg/m) - Obesity is clinically significant and complicates care  - Encourage continued discussion with PCP re: lifestyle modification including dietary changes, regular physical activity, and weight reduction strategies.   On the day of discharge, the patient verbalized feeling ready to be discharged and requested to be sent home.  She was advised to follow-up with her PCP as noted above, which she stated that she will reach out to her PCP to arrange that immediately.  At the time of discharge, the patient was hemodynamically stable with improved symptoms, clinically appropriate for discharge, and in no acute distress. The discharge plan, follow-up instructions, return precautions, and medication changes were reviewed in detail. The patient verbalized understanding, was in agreement with the plan, and had all questions answered prior to leaving the hospital.         Consultants: Pulm Procedures performed: None  Disposition: Home Diet recommendation:   DISCHARGE MEDICATION: Allergies as of 04/16/2024       Reactions   Moxifloxacin Itching, Other (See Comments), Photosensitivity, Rash, Swelling   Causes swelling, redness, burning in eyes   Levofloxacin Other (See Comments)   Other reaction(s): Muscle Pain Other reaction(s): Muscle Pain Other reaction(s): Muscle Pain   Amoxicillin -pot  Clavulanate Nausea Only, Other (See Comments)   Other Reaction: DIARRHEA. Other reaction(s): Other (See Comments) Other Reaction: DIARRHEA. Other reaction(s): Other (See Comments) Other Reaction: DIARRHEA. Other Reaction: DIARRHEA.  Other reaction(s): Other (See Comments) Other Reaction: DIARRHEA.    Other Reaction: DIARRHEA. Other Reaction: DIARRHEA.  Other reaction(s): Other (See Comments) Other Reaction: DIARRHEA.        Medication List     STOP taking these medications    azithromycin  250 MG tablet Commonly known as: Zithromax  Z-Pak   cefdinir  300 MG capsule Commonly known as: OMNICEF    furosemide  40 MG tablet Commonly known as: LASIX    Klor-Con  M20 20 MEQ tablet Generic drug: potassium chloride  SA       TAKE these medications    albuterol  (2.5 MG/3ML) 0.083% nebulizer solution Commonly known as: PROVENTIL  SMARTSIG:1 Vial(s) 4 Times Daily   albuterol  108 (90 Base) MCG/ACT inhaler Commonly known as: VENTOLIN  HFA INHALE 2 PUFFS BY MOUTH EVERY 6 HOURS AS NEEDED FOR WHEEZING OR SHORTNESS OF BREATH   amoxicillin -clavulanate 875-125 MG tablet Commonly known as: AUGMENTIN  Take 1 tablet by mouth every 12 (twelve) hours for 7 days.   apixaban  5 MG Tabs tablet Commonly known as: ELIQUIS  Take 1 tablet (5 mg total) by mouth 2 (two) times daily. What changed: when to take this   ARIPiprazole  5 MG tablet Commonly known as: ABILIFY  Take 1 tablet  by mouth once daily   busPIRone  5 MG tablet Commonly known as: BUSPAR  Take 1 tablet (5 mg total) by mouth 3 (three) times daily.   cyanocobalamin  1000 MCG/ML injection Commonly known as: VITAMIN B12 INJECT  1ML  INTRAMUSCULARLY ONCE EVERY 30 DAYS   diltiazem  120 MG 24 hr capsule Commonly known as: CARDIZEM  CD Take 120 mg by mouth.   hydrOXYzine  25 MG capsule Commonly known as: VISTARIL  Take 1 capsule (25 mg total) by mouth every 8 (eight) hours as needed.   ipratropium 0.06 % nasal spray Commonly known as:  ATROVENT  Place 2 sprays into both nostrils 4 (four) times daily.   Jardiance 10 MG Tabs tablet Generic drug: empagliflozin Take 10 mg by mouth daily.   losartan  50 MG tablet Commonly known as: COZAAR  Take 1 tablet by mouth once daily   MAGnesium -Oxide 400 (240 Mg) MG tablet Generic drug: magnesium  oxide Take 1 tablet by mouth once daily   metoprolol  succinate 25 MG 24 hr tablet Commonly known as: TOPROL -XL Take 1 tablet (25 mg total) by mouth daily. Take with or immediately following a meal.   Multi-Vitamins Tabs Take by mouth.   omeprazole  20 MG capsule Commonly known as: PRILOSEC TAKE 1 CAPSULE BY MOUTH ONCE DAILY BEFORE BREAKFAST   Pari ProNeb Max LC Sprint Misc   promethazine -dextromethorphan 6.25-15 MG/5ML syrup Commonly known as: PROMETHAZINE -DM Take 5 mLs by mouth 4 (four) times daily as needed.   rosuvastatin  40 MG tablet Commonly known as: CRESTOR  Take 40 mg by mouth daily.   Rybelsus  3 MG Tabs Generic drug: Semaglutide  Take 1 tablet (3 mg total) by mouth daily before breakfast. 30 min before med or food. For 30 days, then dose will increase.   saccharomyces boulardii 250 MG capsule Commonly known as: FLORASTOR Take 1 capsule (250 mg total) by mouth 2 (two) times daily.   Spiriva  HandiHaler 18 MCG Caps Generic drug: Tiotropium Bromide  PLACE ONE CAPSULE INTO INHALER AND INHALE ONCE DAILY   spironolactone 25 MG tablet Commonly known as: ALDACTONE Take 25 mg by mouth daily.   Symbicort  160-4.5 MCG/ACT inhaler Generic drug: budesonide-formoterol Inhale 2 puffs into the lungs 2 (two) times daily.   torsemide  20 MG tablet Commonly known as: DEMADEX  Take 20 mg by mouth 2 (two) times daily.   venlafaxine  XR 75 MG 24 hr capsule Commonly known as: EFFEXOR -XR TAKE 3 CAPSULES BY MOUTH ONCE DAILY WITH BREAKFAST        Contact information for follow-up providers     Edman Marsa PARAS, DO. Go on 04/22/2024.   Specialty: Family Medicine Why: @  1:45 PM  hospital follow up - repeat CBC, CMP, and CXR. Discuss sleep apnea testing. Contact information: [address] [place] [address] [phone]         Florencio Cara BIRCH, MD. Go on 04/23/2024.   Specialties: Cardiology, Internal Medicine Why: @ 10:30AM Contact information: [address] [place] [address] [phone]              Contact information for after-discharge care     Home Medical Care     [facility]) .   Service: Home Health Services Contact information: [address] [place]  [address] [phone]                     Discharge Exam: [facility] Weights   04/13/24 2027 04/15/24 0500 04/16/24 0458  Weight: 91.3 kg 91 kg 110.5  kg   Blood pressure (!) 181/58, pulse 86, temperature 98.4 F (36.9 C), resp. rate 14, height 5' 2 (1.575 m), weight 110.5 kg, SpO2 97%.   Gen: NAD, A&Ox3 HEENT: NCAT, EOMI Neck: Supple, no JVD, no LAD CV: RRR, no murmurs Resp: normal WOB, mild rhonchi at bilateral bases, on 2L [place] Abd: Soft, NTND, no guarding, BS normoactive Ext: No LE edema Skin: Warm, dry Neuro: CN II-XII grossly intact, strength 5/5 b/l Psych: Calm, cooperative, appropriate affect  Condition at discharge: good  The results of significant diagnostics from this hospitalization (including imaging, microbiology, ancillary and laboratory) are listed below for reference.   Imaging Studies: DG Chest 2 View Result Date: 04/13/2024 CLINICAL DATA:  Shortness of breath EXAM: CHEST - 2 VIEW COMPARISON:  Chest x-ray 04/06/2024, CT 04/10/2024, 03/03/2024, 10/09/2023 FINDINGS: No new consolidation, pleural effusion or pneumothorax. Stable vague left midlung opacity. No pneumothorax IMPRESSION: Stable vague left midlung opacity, corresponding to mild airspace opacity on prior chest CT referenc chest CT 03/03/2024. No new or progressive airspace disease compared to the recent priors  Electronically Signed   By: Luke Bun M.D.   On: 04/13/2024 17:30   CT CHEST WO CONTRAST Result Date: 04/10/2024 EXAM: CT CHEST WITHOUT CONTRAST 04/10/2024 12:40:05 PM TECHNIQUE: CT of the chest was performed without the administration of intravenous contrast. Multiplanar reformatted images are provided for review. Automated exposure control, iterative reconstruction, and/or weight based adjustment of the mA/kV was utilized to reduce the radiation dose to as low as reasonably achievable. COMPARISON: 03/03/2024 CLINICAL HISTORY: Pneumonia, complication suspected, xray done; interval left lower lobe opacity follow-up, fever symptoms of pneumonia. FINDINGS: MEDIASTINUM: Calcified atherosclerosis in the LAD. Heart and pericardium are otherwise unremarkable. The central airways are clear. LYMPH NODES: No mediastinal, hilar or axillary lymphadenopathy. LUNGS AND PLEURA: Similar small region of consolidation in the anterobasal left lower lobe. , likely scarring Tree in bud nodularity in the right lower lobe. Unchanged 3 mm right upper lobe nodule (axial 38). 2 mm subpleural nodule in the superior segment of the right lower lobe (axial 43). 2 mm lateral nodule in the superior segment of the right lower lobe (axial 48). Minimal centrilobular emphysema. 3 mm nodule in the right lung apex (axial 21). 3 mm nodule in the subpleural left lower lobe (axial 111). No pleural effusion or pneumothorax. SOFT TISSUES/BONES: No acute abnormality of the bones or soft tissues. Left-sided rib fractures are again noted. Osteopenia. Multilevel degenerative disc disease of the spine. UPPER ABDOMEN: Cholecystolithiasis. No changes of acute cholecystitis. IMPRESSION: 1. Tree-in-bud nodularity in the right lower lobe, likely representing an infectious or inflammatory bronchiolitis or changes of aspiration. No lobar pneumonia or pleural effusion. 2. Similar small region of consolidation in the anterobasal left lower lobe, likely representing  scarring. 3. Scattered 2 to 3 mm nodules again noted throughout both lungs, unchanged. Attention on routine oncologic follow-up recommended. 4. Cholecystolithiasis. Electronically signed by: Rogelia Myers MD MD 04/10/2024 02:32 PM EST RP Workstation: [id number]   DG Chest 2 View Result Date: 04/06/2024 CLINICAL DATA:  Cough and shortness of breath. EXAM: CHEST - 2 VIEW COMPARISON:  01/21/2024 FINDINGS: Cardiopericardial silhouette is at upper limits of normal for size. Focal peripheral opacity in the left mid lung is similar to prior non underlies rib deformity/fracture. This region was better characterized on recent CT chest of 03/03/2024. Right lung clear. No evidence for pleural effusion. IMPRESSION: 1. No new acute cardiopulmonary findings. 2. Focal peripheral opacity in the left mid lung is similar to  prior and underlies rib deformity/fracture. Electronically Signed   By: Camellia Candle M.D.   On: 04/06/2024 08:44    Microbiology: Results for orders placed or performed during the hospital encounter of 04/13/24  Resp panel by RT-PCR (RSV, Flu A&B, Covid) Anterior Nasal Swab     Status: None   Collection Time: 04/13/24  8:28 PM   Specimen: Anterior Nasal Swab  Result Value Ref Range Status   SARS Coronavirus 2 by RT PCR NEGATIVE NEGATIVE Final    Comment: (NOTE) SARS-CoV-2 target nucleic acids are NOT DETECTED.  The SARS-CoV-2 RNA is generally detectable in upper respiratory specimens during the acute phase of infection. The lowest concentration of SARS-CoV-2 viral copies this assay can detect is 138 copies/mL. A negative result does not preclude SARS-Cov-2 infection and should not be used as the sole basis for treatment or other patient management decisions. A negative result may occur with  improper specimen collection/handling, submission of specimen other than nasopharyngeal swab, presence of viral mutation(s) within the areas targeted by this assay, and inadequate number of  viral copies(<138 copies/mL). A negative result must be combined with clinical observations, patient history, and epidemiological information. The expected result is Negative.  Fact Sheet for Patients:  bloggercourse.com  Fact Sheet for Healthcare Providers:  seriousbroker.it  This test is no t yet approved or cleared by the [place]  FDA and  has been authorized for detection and/or diagnosis of SARS-CoV-2 by FDA under an Emergency Use Authorization (EUA). This EUA will remain  in effect (meaning this test can be used) for the duration of the COVID-19 declaration under Section 564(b)(1) of the Act, 21 U.S.C.section 360bbb-3(b)(1), unless the authorization is terminated  or revoked sooner.       Influenza A by PCR NEGATIVE NEGATIVE Final   Influenza B by PCR NEGATIVE NEGATIVE Final    Comment: (NOTE) The Xpert Xpress SARS-CoV-2/FLU/RSV plus assay is intended as an aid in the diagnosis of influenza from Nasopharyngeal swab specimens and should not be used as a sole basis for treatment. Nasal washings and aspirates are unacceptable for Xpert Xpress SARS-CoV-2/FLU/RSV testing.  Fact Sheet for Patients: bloggercourse.com  Fact Sheet for Healthcare Providers: seriousbroker.it  This test is not yet approved or cleared by the [place]  FDA and has been authorized for detection and/or diagnosis of SARS-CoV-2 by FDA under an Emergency Use Authorization (EUA). This EUA will remain in effect (meaning this test can be used) for the duration of the COVID-19 declaration under Section 564(b)(1) of the Act, 21 U.S.C. section 360bbb-3(b)(1), unless the authorization is terminated or revoked.     Resp Syncytial Virus by PCR NEGATIVE NEGATIVE Final    Comment: (NOTE) Fact Sheet for Patients: bloggercourse.com  Fact Sheet for Healthcare  Providers: seriousbroker.it  This test is not yet approved or cleared by the [place]  FDA and has been authorized for detection and/or diagnosis of SARS-CoV-2 by FDA under an Emergency Use Authorization (EUA). This EUA will remain in effect (meaning this test can be used) for the duration of the COVID-19 declaration under Section 564(b)(1) of the Act, 21 U.S.C. section 360bbb-3(b)(1), unless the authorization is terminated or revoked.  Performed at [facility], [address] [place], [place] [address]   Culture, blood (Routine X 2) w Reflex to ID Panel     Status: None   Collection Time: 04/13/24  8:28 PM   Specimen: BLOOD  Result Value Ref Range Status   Specimen Description BLOOD BLOOD RIGHT ARM  Final  Special Requests   Final    BOTTLES DRAWN AEROBIC AND ANAEROBIC Blood Culture results may not be optimal due to an inadequate volume of blood received in culture bottles   Culture   Final    NO GROWTH 5 DAYS Performed at [facility], [address] [place], [place] [address]    Report Status 04/18/2024 FINAL  Final  Culture, blood (Routine X 2) w Reflex to ID Panel     Status: None   Collection Time: 04/13/24  8:30 PM   Specimen: BLOOD  Result Value Ref Range Status   Specimen Description BLOOD BLOOD LEFT ARM  Final   Special Requests   Final    BOTTLES DRAWN AEROBIC AND ANAEROBIC Blood Culture results may not be optimal due to an inadequate volume of blood received in culture bottles   Culture   Final    NO GROWTH 5 DAYS Performed at [facility], [address] [place], [place] [address]    Report Status 04/18/2024 FINAL  Final  Respiratory (~20 pathogens) panel by PCR     Status: None   Collection Time: 04/14/24  4:34 PM   Specimen: Nasopharyngeal Swab; Respiratory  Result Value Ref Range Status   Adenovirus NOT DETECTED NOT DETECTED Final   Coronavirus 229E NOT DETECTED NOT DETECTED Final    Comment:  (NOTE) The Coronavirus on the Respiratory Panel, DOES NOT test for the novel  Coronavirus (2019 nCoV)    Coronavirus HKU1 NOT DETECTED NOT DETECTED Final   Coronavirus NL63 NOT DETECTED NOT DETECTED Final   Coronavirus OC43 NOT DETECTED NOT DETECTED Final   Metapneumovirus NOT DETECTED NOT DETECTED Final   Rhinovirus / Enterovirus NOT DETECTED NOT DETECTED Final   Influenza A NOT DETECTED NOT DETECTED Final   Influenza B NOT DETECTED NOT DETECTED Final   Parainfluenza Virus 1 NOT DETECTED NOT DETECTED Final   Parainfluenza Virus 2 NOT DETECTED NOT DETECTED Final   Parainfluenza Virus 3 NOT DETECTED NOT DETECTED Final   Parainfluenza Virus 4 NOT DETECTED NOT DETECTED Final   Respiratory Syncytial Virus NOT DETECTED NOT DETECTED Final   Bordetella pertussis NOT DETECTED NOT DETECTED Final   Bordetella Parapertussis NOT DETECTED NOT DETECTED Final   Chlamydophila pneumoniae NOT DETECTED NOT DETECTED Final   Mycoplasma pneumoniae NOT DETECTED NOT DETECTED Final    Comment: Performed at [facility], [address], [place], [place] [address]    Labs: CBC: Recent Labs  Lab 04/13/24 1601 04/14/24 0350 04/15/24 0532 04/16/24 0533  WBC 8.2 6.9 7.9 6.8  HGB 11.5* 10.6* 10.8* 10.0*  HCT 34.7* 33.0* 32.9* 30.1*  MCV 83.0 84.6 83.7 83.8  PLT 295 282 293 254   Basic Metabolic Panel: Recent Labs  Lab 04/13/24 1601 04/13/24 2028 04/14/24 0350 04/15/24 0532 04/16/24 0533  NA 135  --  138 138 138  K 2.5*  --  3.1* 3.0* 3.7  CL 95*  --  102 98 102  CO2 28  --  26 27 27   GLUCOSE 142*  --  119* 127* 103*  BUN 10  --  10 10 12   CREATININE 1.08*  --  0.97 1.09* 0.98  CALCIUM  9.0  --  8.2* 8.1* 8.7*  MG  --  2.0  --   --   --    Liver Function Tests: No results for input(s): AST, ALT, ALKPHOS, BILITOT, PROT, ALBUMIN in the last 168 hours. CBG: Recent Labs  Lab 04/15/24 0936 04/15/24 1153 04/15/24 1543  04/15/24 2058 04/16/24 0927  GLUCAP 166* 187* 132* 224*  214*    Discharge time spent: Time Coordinating Discharge: I spent a total of 35 minutes engaged in face-to-face discussion with the patient and/or caregivers regarding the patients care, assessment, plan, and discharge disposition. Over 50% of this time was dedicated to counseling the patient on the risks and benefits of treatment options and the discharge plan, as well as coordinating post-discharge care.   Signed: Corrinne Benegas Al-Sultani, MD [facility] 04/18/2024         "

## 2024-04-20 ENCOUNTER — Telehealth: Payer: Self-pay

## 2024-04-20 NOTE — Telephone Encounter (Signed)
 Patient recently admitted for pneumonia.  Discharged on 04/17/2023 with hemoglobin at 10.0.  Patient's daughter wants to know if she needs to be followed sooner that scheduled 09/2023 or have labs checked.

## 2024-04-22 ENCOUNTER — Encounter: Payer: Self-pay | Admitting: Family Medicine

## 2024-04-22 ENCOUNTER — Ambulatory Visit: Admitting: Family Medicine

## 2024-04-22 ENCOUNTER — Ambulatory Visit
Admission: RE | Admit: 2024-04-22 | Discharge: 2024-04-22 | Disposition: A | Discharge status: Home / Self Care | Source: Ambulatory Visit | Attending: Family Medicine | Admitting: Family Medicine

## 2024-04-22 ENCOUNTER — Ambulatory Visit: Payer: Self-pay | Admitting: Family Medicine

## 2024-04-22 ENCOUNTER — Encounter: Payer: Self-pay | Admitting: Oncology

## 2024-04-22 VITALS — BP 142/54 | HR 107 | Ht 62.0 in | Wt 195.2 lb

## 2024-04-22 DIAGNOSIS — D509 Iron deficiency anemia, unspecified: Secondary | ICD-10-CM

## 2024-04-22 DIAGNOSIS — J189 Pneumonia, unspecified organism: Secondary | ICD-10-CM | POA: Diagnosis present

## 2024-04-22 DIAGNOSIS — E876 Hypokalemia: Secondary | ICD-10-CM

## 2024-04-22 MED ORDER — POTASSIUM CHLORIDE CRYS ER 20 MEQ PO TBCR: 20.0000 meq | EXTENDED_RELEASE_TABLET | Freq: Every day | ORAL | 3 refills | Status: AC

## 2024-04-22 MED ORDER — AMOXICILLIN-POT CLAVULANATE 875-125 MG PO TABS: 1.0000 | ORAL_TABLET | Freq: Two times a day (BID) | ORAL | 0 refills | Status: DC

## 2024-04-22 NOTE — Progress Notes (Unsigned)
 "  Subjective:    Patient ID: Elizabeth [name], female    DOB: 03-05-1951, 74 y.o.   MRN: [id number]  Elizabeth [name] is a 74 y.o. female presenting on 04/22/2024 for Hospitalization Follow-up   HPI  Discussed the use of AI scribe software for clinical note transcription with the patient, who gave verbal consent to proceed.  History of Present Illness   Elizabeth [name] is a 74 year old female with COPD and anemia who presents for follow-up after hospitalization for pneumonia.   HOSPITAL FOLLOW-UP VISIT  Hospital/Location: [facility] Date of Admission: 04/13/24 Date of Discharge: 04/16/24 Transitions of care telephone call: TOC completed 04/17/24 Christine Homsher  Reason for Admission: Dyspnea  - Hospital H&P and Discharge Summary have been reviewed - Patient presents today 6 days after recent hospitalization  Pneumonia Fever and infectious symptoms - Hospitalized for pneumonia from January 12th to January 15th, confirmed by CT scan - Continues to experience low-grade fevers in the afternoons, returning to normal by morning - Uncertain about the need for acetaminophen  (Tylenol ) for fever management - Currently taking Augmentin , which does not cause gastrointestinal upset if taken with food  Dyspnea and respiratory symptoms - History of COPD with reported lower lung volume loss ('shriveling' of lower lungs) - Experiences a sensation of smothering and dyspnea, especially with ambulation - Finds dyspnea episodes frightening  Anemia and fatigue - Hemoglobin decreased from 12 to 10 over the past few months, last recorded at 10 - Feels 'a little weak' - Concerned about delayed hematology follow-up, scheduled in three months  Muscle cramps and weakness - Previously on potassium supplements, discontinued after change in diuretic regimen - Currently taking torsemide  (potassium-lowering) and spironolactone (potassium-sparing) - Experiences significant weakness and severe muscle cramps,  especially in the legs   Functional status and precautions - Does not drive on highways, only short distances to the grocery store - Practices health precautions by wearing a mask and avoiding crowded places such as [employer]   Hypokalemia Last result improved to K 3.7. Prior 2.7 to 3.0 Off potassium supplement outpatient. She was given repletion during hospitalization Cardiology took her off K when changed Furosemide  to Torsemide  On Spironolactone 25mg  daily,  and ARB  Temp elevates to 99-100*F 4pm or later   - Today reports overall has done well after discharge. Symptoms of dyspnea has improved but not resolved  - New medications on discharge: Augmentin  - Changes to current meds on discharge: see list  I have reviewed the discharge medication list, and have reconciled the current and discharge medications today.  Current Medications[1]  ------------------------------------------------------------------------- Social History[2]  Review of Systems Per HPI unless specifically indicated above     Objective:    BP (!) 142/54 (BP Location: Left Arm, Patient Position: Sitting, Cuff Size: Large)   Pulse (!) 107   Ht 5' 2 (1.575 m)   Wt 195 lb 4 oz (88.6 kg)   SpO2 95%   BMI 35.71 kg/m   Wt Readings from Last 3 Encounters:  04/22/24 195 lb 4 oz (88.6 kg)  04/16/24 243 lb 9.7 oz (110.5 kg)  04/10/24 194 lb (88 kg)    Physical Exam Vitals and nursing note reviewed.  Constitutional:      General: She is not in acute distress.    Appearance: She is well-developed. She is not diaphoretic.     Comments: Well-appearing, comfortable, cooperative  HENT:     Head: Normocephalic and atraumatic.  Eyes:     General:  Right eye: No discharge.        Left eye: No discharge.     Conjunctiva/sclera: Conjunctivae normal.  Neck:     Thyroid : No thyromegaly.  Cardiovascular:     Rate and Rhythm: Normal rate. Rhythm irregular.     Heart sounds: Normal heart sounds. No murmur  heard. Pulmonary:     Effort: Pulmonary effort is normal. No respiratory distress.     Breath sounds: No wheezing or rales.     Comments: Mild reduced air movement including lower bases. No focal wheezing. Musculoskeletal:        General: Normal range of motion.     Cervical back: Normal range of motion and neck supple.  Lymphadenopathy:     Cervical: No cervical adenopathy.  Skin:    General: Skin is warm and dry.     Findings: No erythema or rash.  Neurological:     Mental Status: She is alert and oriented to person, place, and time.  Psychiatric:        Behavior: Behavior normal.     Comments: Well groomed, good eye contact, normal speech and thoughts    I have personally reviewed the radiology report from 04/22/24 on CXR.  Narrative & Impression  EXAM: 2 VIEW(S) XRAY OF THE CHEST 04/22/2024 03:11:25 PM   COMPARISON: 04/13/2024   CLINICAL HISTORY: pneumonia follow-up after hospital   FINDINGS:   LUNGS AND PLEURA: Stable patchy opacity in left mid lung. No pleural effusion. No pneumothorax.   HEART AND MEDIASTINUM: No acute abnormality of the cardiac and mediastinal silhouettes.   BONES AND SOFT TISSUES: Multilevel degenerative changes of thoracic spine.   IMPRESSION: 1. Stable patchy opacity in the left mid lung worrisome for infection .   Electronically signed by: Greig Pique MD 04/22/2024 03:15 PM EST RP Workstation: [id number]    PREVIOUS CT imaging result I have personally reviewed the radiology report from 04/10/24 on CT.  EXAM: CT CHEST WITHOUT CONTRAST 04/10/2024 12:40:05 PM   TECHNIQUE: CT of the chest was performed without the administration of intravenous contrast. Multiplanar reformatted images are provided for review. Automated exposure control, iterative reconstruction, and/or weight based adjustment of the mA/kV was utilized to reduce the radiation dose to as low as reasonably achievable.   COMPARISON: 03/03/2024   CLINICAL  HISTORY: Pneumonia, complication suspected, xray done; interval left lower lobe opacity follow-up, fever symptoms of pneumonia.   FINDINGS:   MEDIASTINUM: Calcified atherosclerosis in the LAD. Heart and pericardium are otherwise unremarkable. The central airways are clear.   LYMPH NODES: No mediastinal, hilar or axillary lymphadenopathy.   LUNGS AND PLEURA: Similar small region of consolidation in the anterobasal left lower lobe. , likely scarring Tree in bud nodularity in the right lower lobe. Unchanged 3 mm right upper lobe nodule (axial 38). 2 mm subpleural nodule in the superior segment of the right lower lobe (axial 43). 2 mm lateral nodule in the superior segment of the right lower lobe (axial 48). Minimal centrilobular emphysema. 3 mm nodule in the right lung apex (axial 21). 3 mm nodule in the subpleural left lower lobe (axial 111). No pleural effusion or pneumothorax.   SOFT TISSUES/BONES: No acute abnormality of the bones or soft tissues. Left-sided rib fractures are again noted. Osteopenia. Multilevel degenerative disc disease of the spine.   UPPER ABDOMEN: Cholecystolithiasis. No changes of acute cholecystitis.   IMPRESSION: 1. Tree-in-bud nodularity in the right lower lobe, likely representing an infectious or inflammatory bronchiolitis or changes of aspiration. No lobar  pneumonia or pleural effusion. 2. Similar small region of consolidation in the anterobasal left lower lobe, likely representing scarring. 3. Scattered 2 to 3 mm nodules again noted throughout both lungs, unchanged. Attention on routine oncologic follow-up recommended. 4. Cholecystolithiasis.   Electronically signed by: Rogelia Myers MD MD 04/10/2024 02:32 PM EST RP Workstation: [id number]  Results for orders placed or performed in visit on 04/22/24  Comprehensive metabolic panel with GFR   Collection Time: 04/22/24  2:45 PM  Result Value Ref Range   Glucose, Bld 125 65 - 139 mg/dL   BUN  14 7 - 25 mg/dL   Creat 8.97 (H) 9.39 - 1.00 mg/dL   eGFR 58 (L) > OR = 60 mL/min/1.66m2   BUN/Creatinine Ratio 14 6 - 22 (calc)   Sodium 139 135 - 146 mmol/L   Potassium 3.6 3.5 - 5.3 mmol/L   Chloride 99 98 - 110 mmol/L   CO2 32 20 - 32 mmol/L   Calcium  9.2 8.6 - 10.4 mg/dL   Total Protein 7.2 6.1 - 8.1 g/dL   Albumin 4.0 3.6 - 5.1 g/dL   Globulin 3.2 1.9 - 3.7 g/dL (calc)   AG Ratio 1.3 1.0 - 2.5 (calc)   Total Bilirubin 0.4 0.2 - 1.2 mg/dL   Alkaline phosphatase (APISO) 95 37 - 153 U/L   AST 38 (H) 10 - 35 U/L   ALT 19 6 - 29 U/L  CBC with Differential/Platelet   Collection Time: 04/22/24  2:45 PM  Result Value Ref Range   WBC 9.9 3.8 - 10.8 Thousand/uL   RBC 4.11 3.80 - 5.10 Million/uL   Hemoglobin 11.2 (L) 11.7 - 15.5 g/dL   HCT 65.6 (L) 64.0 - 53.9 %   MCV 83.5 81.4 - 101.7 fL   MCH 27.3 27.0 - 33.0 pg   MCHC 32.7 31.6 - 35.4 g/dL   RDW 86.0 88.9 - 84.9 %   Platelets 351 140 - 400 Thousand/uL   MPV 9.5 7.5 - 12.5 fL   Neutro Abs 6,267 1,500 - 7,800 cells/uL   Absolute Lymphocytes 2,831 850 - 3,900 cells/uL   Absolute Monocytes 782 200 - 950 cells/uL   Eosinophils Absolute 10 (L) 15 - 500 cells/uL   Basophils Absolute 10 0 - 200 cells/uL   Neutrophils Relative % 63.3 %   Total Lymphocyte 28.6 %   Monocytes Relative 7.9 %   Eosinophils Relative 0.1 %   Basophils Relative 0.1 %  Iron , TIBC and Ferritin Panel   Collection Time: 04/22/24  2:45 PM  Result Value Ref Range   Iron  41 (L) 45 - 160 mcg/dL   TIBC 757 (L) 749 - 549 mcg/dL (calc)   %SAT 17 16 - 45 % (calc)   Ferritin 242 16 - 288 ng/mL  Magnesium    Collection Time: 04/22/24  2:45 PM  Result Value Ref Range   Magnesium  2.0 1.5 - 2.5 mg/dL      Assessment & Plan:   Problem List Items Addressed This Visit     Community acquired pneumonia - Primary   Relevant Medications   amoxicillin -clavulanate (AUGMENTIN ) 875-125 MG tablet   Other Relevant Orders   Comprehensive metabolic panel with GFR  (Completed)   CBC with Differential/Platelet (Completed)   DG Chest 2 View (Completed)   Other Visit Diagnoses       Hypokalemia       Relevant Medications   potassium chloride  SA (KLOR-CON  M) 20 MEQ tablet   Other Relevant Orders   Comprehensive metabolic panel  with GFR (Completed)   Magnesium  (Completed)     Microcytic anemia       Relevant Orders   Comprehensive metabolic panel with GFR (Completed)   CBC with Differential/Platelet (Completed)   Iron , TIBC and Ferritin Panel (Completed)        Community acquired pneumonia of left lower lobe Recent hospitalization confirmed pneumonia. Multifocal pneumonia, treated w/ antibiotic course prior to hospitalization unsuccessful failed outpatient therapy then in hospital continued course. Improvement prior to discharge.  Dyspnea on exertion persists. Lungs show improved air movement, lower lobes underinflated due to COPD/emphysema. Low-grade fever likely immune response. - Ordered chest x-ray today STAT. See below on results - Extended Augmentin  for 7 more days. - Advised use of Tylenol  for low-grade fever if needed. - Follow w/ Pulm for future recurrent infections  Iron  deficiency anemia Hemoglobin decreased from 12 to 10. Hematologist advised follow-up in April unless hemoglobin worsens. Current hemoglobin is 10.0. - Ordered CBC and iron  panel today. - Will contact [facility] Dr Jacobo hematologist if hemoglobin and iron  levels are significantly low. And concerns for dyspnea present. - Next Hematology lab + apt was moved from June to April 2026. Will reach out if indicated for further concerns pending lab results.  Hypokalemia Potassium levels improved during hospitalization but discontinued upon discharge. Reports weakness and cramps likely due to low potassium. Cardiologist adjusted fluid medications, Lasix  + K stopped and switched to Torsemide , leading to discontinuation of potassium supplements. Potassium levels improved but require  monitoring. - Restarted potassium supplementation, 20 mEq daily. - Ordered potassium and magnesium  levels today. - Will discuss potassium supplementation with cardiologist at next visit.   Update 04/22/24 after visit called w/ CXR results  It shows some persistent patchy opacity in the left mid lung that is still consistent with residual pneumonia. This is not surprising as the lungs will often still show residual sign of infection on Chest X-ray for several weeks after it starts improving.   The 7 day extension on the antibiotic should hopefully resolve this. We can repeat chest x-ray again in 2-3 weeks if still having concerns.     Meds ordered this encounter  Medications   potassium chloride  SA (KLOR-CON  M) 20 MEQ tablet    Sig: Take 1 tablet (20 mEq total) by mouth daily.    Dispense:  90 tablet    Refill:  3   amoxicillin -clavulanate (AUGMENTIN ) 875-125 MG tablet    Sig: Take 1 tablet by mouth 2 (two) times daily.    Dispense:  14 tablet    Refill:  0    Extension of antibiotics 7 more days    Follow up plan: Return in about 3 months (around 07/21/2024) for 3 month follow-up COPD / Anemia / Updates.   Marsa Officer, DO [facility] 04/22/2024, 2:19 PM     [1]  Current Outpatient Medications:    albuterol  (PROVENTIL ) (2.5 MG/3ML) 0.083% nebulizer solution, SMARTSIG:1 Vial(s) 4 Times Daily, Disp: , Rfl:    albuterol  (VENTOLIN  HFA) 108 (90 Base) MCG/ACT inhaler, INHALE 2 PUFFS BY MOUTH EVERY 6 HOURS AS NEEDED FOR WHEEZING OR SHORTNESS OF BREATH, Disp: 54 g, Rfl: 0   amoxicillin -clavulanate (AUGMENTIN ) 875-125 MG tablet, Take 1 tablet by mouth 2 (two) times daily., Disp: 14 tablet, Rfl: 0   apixaban  (ELIQUIS ) 5 MG TABS tablet, Take 1 tablet (5 mg total) by mouth 2 (two) times daily. (Patient taking differently: Take 5 mg by mouth daily.), Disp: 60 tablet, Rfl:  0   ARIPiprazole  (ABILIFY ) 5 MG tablet, Take 1 tablet by mouth once  daily, Disp: 90 tablet, Rfl: 0   cyanocobalamin  (VITAMIN B12) 1000 MCG/ML injection, INJECT  1ML  INTRAMUSCULARLY ONCE EVERY 30 DAYS, Disp: 3 mL, Rfl: 0   diltiazem  (CARDIZEM  CD) 120 MG 24 hr capsule, Take 120 mg by mouth., Disp: , Rfl:    ipratropium (ATROVENT ) 0.06 % nasal spray, Place 2 sprays into both nostrils 4 (four) times daily., Disp: 15 mL, Rfl: 12   JARDIANCE 10 MG TABS tablet, Take 10 mg by mouth daily., Disp: , Rfl:    losartan  (COZAAR ) 50 MG tablet, Take 1 tablet by mouth once daily, Disp: 90 tablet, Rfl: 0   MAGNESIUM -OXIDE 400 (240 Mg) MG tablet, Take 1 tablet by mouth once daily, Disp: 90 tablet, Rfl: 0   metoprolol  succinate (TOPROL -XL) 25 MG 24 hr tablet, Take 1 tablet (25 mg total) by mouth daily. Take with or immediately following a meal., Disp: 90 tablet, Rfl: 1   Multiple Vitamin (MULTI-VITAMINS) TABS, Take by mouth., Disp: , Rfl:    Nebulizers (PARI PRONEB MAX LC SPRINT) MISC, , Disp: , Rfl:    omeprazole  (PRILOSEC) 20 MG capsule, TAKE 1 CAPSULE BY MOUTH ONCE DAILY BEFORE BREAKFAST, Disp: 90 capsule, Rfl: 2   potassium chloride  SA (KLOR-CON  M) 20 MEQ tablet, Take 1 tablet (20 mEq total) by mouth daily., Disp: 90 tablet, Rfl: 3   rosuvastatin  (CRESTOR ) 40 MG tablet, Take 40 mg by mouth daily., Disp: , Rfl:    RYBELSUS  3 MG TABS, Take 1 tablet (3 mg total) by mouth daily before breakfast. 30 min before med or food. For 30 days, then dose will increase., Disp: 30 tablet, Rfl: 0   SPIRIVA  HANDIHALER 18 MCG CAPS, PLACE ONE CAPSULE INTO INHALER AND INHALE ONCE DAILY, Disp: 90 capsule, Rfl: 0   spironolactone (ALDACTONE) 25 MG tablet, Take 25 mg by mouth daily., Disp: , Rfl:    SYMBICORT  160-4.5 MCG/ACT inhaler, Inhale 2 puffs into the lungs 2 (two) times daily., Disp: 1 each, Rfl: 12   torsemide  (DEMADEX ) 20 MG tablet, Take 20 mg by mouth 2 (two) times daily., Disp: , Rfl:    venlafaxine  XR (EFFEXOR -XR) 75 MG 24 hr capsule, TAKE 3 CAPSULES BY MOUTH ONCE DAILY WITH BREAKFAST, Disp:  270 capsule, Rfl: 0   busPIRone  (BUSPAR ) 5 MG tablet, Take 1 tablet (5 mg total) by mouth 3 (three) times daily. (Patient not taking: Reported on 04/22/2024), Disp: 90 tablet, Rfl: 0   hydrOXYzine  (VISTARIL ) 25 MG capsule, Take 1 capsule (25 mg total) by mouth every 8 (eight) hours as needed. (Patient not taking: Reported on 04/22/2024), Disp: 90 capsule, Rfl: 0   promethazine -dextromethorphan (PROMETHAZINE -DM) 6.25-15 MG/5ML syrup, Take 5 mLs by mouth 4 (four) times daily as needed. (Patient not taking: Reported on 04/22/2024), Disp: 118 mL, Rfl: 0   saccharomyces boulardii (FLORASTOR) 250 MG capsule, Take 1 capsule (250 mg total) by mouth 2 (two) times daily. (Patient not taking: Reported on 04/22/2024), Disp: 30 capsule, Rfl: 0 [2]  Social History Tobacco Use   Smoking status: Former    Current packs/day: 0.00    Average packs/day: 0.5 packs/day for 51.9 years (25.9 ttl pk-yrs)    Types: Cigarettes    Start date: 12/02/1971    Quit date: 10/18/2023    Years since quitting: 0.5   Smokeless tobacco: Never   Tobacco comments:    Quit smoking 10/18/2023        Started smoking  at 74 years old.    Smoked 1.5PPD at her heaviest  Vaping Use   Vaping status: Some Days  Substance Use Topics   Alcohol use: No   Drug use: No   "

## 2024-04-22 NOTE — Patient Instructions (Addendum)
 Thank you for coming to the office today.  Restart Potassium 20meq daily new rx sent  Extend Augmentin  antibiotic x 7 days, new rx sent.  Chest X-ray today  Labs today  We will check the Potassium Magnesium  and Iron   If the Hgb and Iron  is low, I will contact Dr Jacobo for you. And see what he says.  Please schedule a Follow-up Appointment to: Return in about 3 months (around 07/21/2024) for 3 month follow-up COPD / Anemia / Updates.  If you have any other questions or concerns, please feel free to call the office or send a message through MyChart. You may also schedule an earlier appointment if necessary.  Additionally, you may be receiving a survey about your experience at our office within a few days to 1 week by e-mail or mail. We value your feedback.  Marsa Officer, DO [facility], [place]

## 2024-04-23 ENCOUNTER — Encounter: Payer: Self-pay | Admitting: Family Medicine

## 2024-04-23 DIAGNOSIS — J189 Pneumonia, unspecified organism: Secondary | ICD-10-CM

## 2024-04-23 LAB — CBC WITH DIFFERENTIAL/PLATELET
Absolute Lymphocytes: 2831 {cells}/uL (ref 850–3900)
Absolute Monocytes: 782 {cells}/uL (ref 200–950)
Basophils Absolute: 10 {cells}/uL (ref 0–200)
Basophils Relative: 0.1 %
Eosinophils Absolute: 10 {cells}/uL — ABNORMAL LOW (ref 15–500)
Eosinophils Relative: 0.1 %
HCT: 34.3 % — ABNORMAL LOW (ref 35.9–46.0)
Hemoglobin: 11.2 g/dL — ABNORMAL LOW (ref 11.7–15.5)
MCH: 27.3 pg (ref 27.0–33.0)
MCHC: 32.7 g/dL (ref 31.6–35.4)
MCV: 83.5 fL (ref 81.4–101.7)
MPV: 9.5 fL (ref 7.5–12.5)
Monocytes Relative: 7.9 %
Neutro Abs: 6267 {cells}/uL (ref 1500–7800)
Neutrophils Relative %: 63.3 %
Platelets: 351 Thousand/uL (ref 140–400)
RBC: 4.11 Million/uL (ref 3.80–5.10)
RDW: 13.9 % (ref 11.0–15.0)
Total Lymphocyte: 28.6 %
WBC: 9.9 Thousand/uL (ref 3.8–10.8)

## 2024-04-23 LAB — COMPREHENSIVE METABOLIC PANEL WITH GFR
AG Ratio: 1.3 (calc) (ref 1.0–2.5)
ALT: 19 U/L (ref 6–29)
AST: 38 U/L — ABNORMAL HIGH (ref 10–35)
Albumin: 4 g/dL (ref 3.6–5.1)
Alkaline phosphatase (APISO): 95 U/L (ref 37–153)
BUN/Creatinine Ratio: 14 (calc) (ref 6–22)
BUN: 14 mg/dL (ref 7–25)
CO2: 32 mmol/L (ref 20–32)
Calcium: 9.2 mg/dL (ref 8.6–10.4)
Chloride: 99 mmol/L (ref 98–110)
Creat: 1.02 mg/dL — ABNORMAL HIGH (ref 0.60–1.00)
Globulin: 3.2 g/dL (ref 1.9–3.7)
Glucose, Bld: 125 mg/dL (ref 65–139)
Potassium: 3.6 mmol/L (ref 3.5–5.3)
Sodium: 139 mmol/L (ref 135–146)
Total Bilirubin: 0.4 mg/dL (ref 0.2–1.2)
Total Protein: 7.2 g/dL (ref 6.1–8.1)
eGFR: 58 mL/min/1.73m2 — ABNORMAL LOW

## 2024-04-23 LAB — IRON,TIBC AND FERRITIN PANEL
%SAT: 17 % (ref 16–45)
Ferritin: 242 ng/mL (ref 16–288)
Iron: 41 ug/dL — ABNORMAL LOW (ref 45–160)
TIBC: 242 ug/dL — ABNORMAL LOW (ref 250–450)

## 2024-04-23 LAB — MAGNESIUM: Magnesium: 2 mg/dL (ref 1.5–2.5)

## 2024-04-23 MED ORDER — AMOXICILLIN-POT CLAVULANATE 400-57 MG/5ML PO SUSR: 875.0000 mg | Freq: Two times a day (BID) | ORAL | 0 refills | Status: AC

## 2024-04-27 ENCOUNTER — Ambulatory Visit: Payer: Self-pay | Admitting: Pulmonary Disease

## 2024-04-27 NOTE — Telephone Encounter (Signed)
 FYI Only or Action Required?: Action required by provider: request for appointment. Pt's daughter states that per hospital  - pt should be on O2 all the time. O2 is 93 now  Patient is followed in Pulmonology for emphysema , last seen on 02/10/2024 by Malka Domino, MD.  Called Nurse Triage reporting Shortness of Breath.Patient still has the symptoms, extremely tired, chest congestion, shortness of breath, wheezing, fever 100 in the afternoon.  Symptoms began several weeks ago.  Interventions attempted: Other: hospitalized and saw pcp.  Symptoms are: gradually worsening.  Triage Disposition: See HCP Within 4 Hours (Or PCP Triage)  Patient/caregiver understands and will follow disposition?: Yes                        E2C2 Pulmonary Triage - Initial Assessment Questions Chief Complaint (e.g., cough, sob, wheezing, fever, chills, sweat or additional symptoms) *Go to specific symptom protocol after initial questions. 2 weeks  How long have symptoms been present? Before hospitalized  Have you tested for COVID or Flu? Note: If not, ask patient if a home test can be taken. If so, instruct patient to call back for positive results. No  MEDICINES:   Have you used any OTC meds to help with symptoms? Yes If yes, ask What medications? yes  Have you used your inhalers/maintenance medication? Yes If yes, What medications? All of them  If inhaler, ask How many puffs and how often? Note: Review instructions on medication in the chart. Symbicort , albuterol    OXYGEN: Do you wear supplemental oxygen? No If yes, How many liters are you supposed to use?   Do you monitor your oxygen levels? Yes If yes, What is your reading (oxygen level) today? 94  What is your usual oxygen saturation reading?  (Note: Pulmonary O2 sats should be 90% or greater) 96                  Reason for Triage: Patient's child Clotilda [phone]  states patient needs to be seen sooner than 05/19/24 with Dr. Malka, was release from the [facility] hospital 2 weeks ago. Patient still has the symptoms, extremely tired, chest congestion, shortness of breath, wheezing, fever 100 in the afternoon. Patient denies dizziness, nor pain. Patient did a walking test in the hospital and was adivsed to be on oxygen full time not just night. Please advise.  Reason for Disposition  [1] Longstanding difficulty breathing (e.g., CHF, COPD, emphysema) AND [2] WORSE than normal  Protocols used: Breathing Difficulty-A-AH

## 2024-04-27 NOTE — Telephone Encounter (Signed)
 On call : spoke with daughter , has ov on 04/29/24 for post hospital follow up . She is doing better but not back to baseline yet since hospital stay.  Advised to keep ov as planned  Please contact office for sooner follow up if symptoms do not improve or worsen or seek emergency care

## 2024-04-29 ENCOUNTER — Encounter: Payer: Self-pay | Admitting: Pulmonary Disease

## 2024-04-29 ENCOUNTER — Ambulatory Visit: Admitting: Pulmonary Disease

## 2024-04-29 VITALS — BP 120/80 | HR 90 | Temp 98.5°F | Ht 62.0 in | Wt 194.0 lb

## 2024-04-29 DIAGNOSIS — J4489 Other specified chronic obstructive pulmonary disease: Secondary | ICD-10-CM | POA: Diagnosis not present

## 2024-04-29 DIAGNOSIS — J4550 Severe persistent asthma, uncomplicated: Secondary | ICD-10-CM

## 2024-04-29 DIAGNOSIS — Z87891 Personal history of nicotine dependence: Secondary | ICD-10-CM | POA: Diagnosis not present

## 2024-04-29 DIAGNOSIS — J439 Emphysema, unspecified: Secondary | ICD-10-CM

## 2024-04-29 DIAGNOSIS — G4733 Obstructive sleep apnea (adult) (pediatric): Secondary | ICD-10-CM | POA: Diagnosis not present

## 2024-04-29 NOTE — Progress Notes (Unsigned)
 "  Synopsis: Referred in by Edman Marsa PARAS, DO   Subjective:   PATIENT ID: Elizabeth [name] GENDER: female DOB: 12/09/1950, MRN: [id number]  Chief Complaint  Patient presents with   Hospitalization Follow-up    ED on 04/13/2024 for Pneumonia. SOB. Wheezing. Cough with green/yellow sputum. No fever in the last few days. Symbicort - BID, helps with her breathing. Spiriva - daily. Albuterol  Neb- TID. Albuterol  inhaler-  2-3 times daily     HPI Discussed the use of AI scribe software for clinical note transcription with the patient, who gave verbal consent to proceed.  History of Present Illness   Elizabeth [name] is a 74 year old female with COPD and sleep apnea who presents with worsening breathing difficulties. She is accompanied by her daughter, who is her primary caregiver.  She experiences significant dyspnea, unable to walk to the front door without becoming breathless. She uses oxygen at night and a CPAP machine for her sleep apnea. Despite performing well on a walking test in [facility], she feels she needs continuous oxygen. She is frustrated with traveling to [facility] for care and wishes to switch to a closer facility to ease the burden on her daughter, who drives her.  She has a history of COPD and is currently on Symbicort , Spiriva , and albuterol  as needed. She quit smoking in July after a respiratory arrest incident, having smoked a pack to a pack and a half daily since age 5. She experienced respiratory arrest on July 18th and was hospitalized for five days. Since then, she has not smoked. She mentions memory problems following the hospitalization, which she attributes to the respiratory issues.  She has a history of lung cancer, treated with radiation therapy. Her recent CT scan was noted to be good, and she is scheduled to see Dr. Jacobo in December for follow-up. She recalls a previous breathing test in 2021 where she almost passed out, leading to a  cessation of further testing at that time.  She is uncertain about the specifics of her vaccinations received in April.   OV 04/29/2024 - Ms. Coyne is here to follow up on her respiratory status. She was recently admitted to [facility] earlier this month with CAP and COPD/Asthma exacerbation. She is still completing Augmentin  but feels overall that she s improving. CT chest 04/10/2024 showed right lower lobe infiltrate that were new. But redemonstrated her known left lower lobe consolidation likely representing scarring from prior radiation. She is still symptomatic from an obstructive pulmonary disease stand point. We discussed that this is majorly her COPD but also might have underlying asthma component with her history of significant allergies, strong family history of asthma. I started her on Ohtuvayre  last appointment but she hasn't started it yet and is willing to start it next week. We also discussed the role of biologics in her condition, and she might find some benefit specially with her strong asthma component. She is agreeable and we will be starting Tezpire. I will see her back in 3 months.        Family History  Problem Relation Age of Onset   Ovarian cancer Mother 57       deceased 15   Breast cancer Mother 29   Stroke Father    Lung cancer Paternal Aunt    Lung cancer Cousin      Social History   Socioeconomic History   Marital status: Divorced    Spouse name: Not on file   Number of children: 1  Years of education: Not on file   Highest education level: Associate degree: occupational, scientist, product/process development, or vocational program  Occupational History   Occupation: Retired  Tobacco Use   Smoking status: Former    Current packs/day: 0.00    Average packs/day: 0.5 packs/day for 51.9 years (25.9 ttl pk-yrs)    Types: Cigarettes    Start date: 12/02/1971    Quit date: 10/18/2023    Years since quitting: 0.5   Smokeless tobacco: Never   Tobacco comments:    Quit smoking 10/18/2023         Started smoking at 74 years old.    Smoked 1.5PPD at her heaviest  Vaping Use   Vaping status: Some Days  Substance and Sexual Activity   Alcohol use: No   Drug use: No   Sexual activity: Not Currently  Other Topics Concern   Not on file  Social History Narrative   Not on file   Social Drivers of Health   Tobacco Use: Medium Risk (04/29/2024)   Patient History    Smoking Tobacco Use: Former    Smokeless Tobacco Use: Never    Passive Exposure: Not on file  Financial Resource Strain: Low Risk (10/11/2023)   Overall Financial Resource Strain (CARDIA)    Difficulty of Paying Living Expenses: Not hard at all  Food Insecurity: No Food Insecurity (04/17/2024)   Epic    Worried About Programme Researcher, Broadcasting/film/video in the Last Year: Never true    Ran Out of Food in the Last Year: Never true  Transportation Needs: No Transportation Needs (04/17/2024)   Epic    Lack of Transportation (Medical): No    Lack of Transportation (Non-Medical): No  Physical Activity: Inactive (10/11/2023)   Exercise Vital Sign    Days of Exercise per Week: 0 days    Minutes of Exercise per Session: 0 min  Stress: No Stress Concern Present (10/11/2023)   [employer] of Occupational Health - Occupational Stress Questionnaire    Feeling of Stress: Not at all  Social Connections: Socially Isolated (12/06/2023)   Social Connection and Isolation Panel    Frequency of Communication with Friends and Family: More than three times a week    Frequency of Social Gatherings with Friends and Family: More than three times a week    Attends Religious Services: Never    Database Administrator or Organizations: No    Attends Banker Meetings: Never    Marital Status: Divorced  Catering Manager Violence: Not At Risk (04/17/2024)   Epic    Fear of Current or Ex-Partner: No    Emotionally Abused: No    Physically Abused: No    Sexually Abused: No  Depression (PHQ2-9): Low Risk (04/10/2024)   Depression (PHQ2-9)     PHQ-2 Score: 3  Alcohol Screen: Low Risk (10/11/2023)   Alcohol Screen    Last Alcohol Screening Score (AUDIT): 0  Housing: Unknown (04/17/2024)   Epic    Unable to Pay for Housing in the Last Year: No    Number of Times Moved in the Last Year: Not on file    Homeless in the Last Year: No  Utilities: Not At Risk (04/17/2024)   Epic    Threatened with loss of utilities: No  Health Literacy: Adequate Health Literacy (12/06/2023)   B1300 Health Literacy    Frequency of need for help with medical instructions: Never        Objective:   There were no vitals filed for  this visit.    on RA BMI Readings from Last 3 Encounters:  04/22/24 35.71 kg/m  04/16/24 44.56 kg/m  04/10/24 35.48 kg/m   Wt Readings from Last 3 Encounters:  04/22/24 195 lb 4 oz (88.6 kg)  04/16/24 243 lb 9.7 oz (110.5 kg)  04/10/24 194 lb (88 kg)    Physical Exam GEN: NAD HEENT: Supple Neck, Reactive Pupils, EOMI  CVS: Normal S1, Normal S2, RRR, No murmurs or ES appreciated  Lungs: Poor air movement.  Abdomen: Soft, non tender, non distended, + BS  Extremities: Warm and well perfused, No edema  Skin: No suspicious lesions appreciated  Psych: Normal Affect  Ancillary Information   CBC    Component Value Date/Time   WBC 9.9 04/22/2024 1445   RBC 4.11 04/22/2024 1445   HGB 11.2 (L) 04/22/2024 1445   HGB 13.2 05/06/2014 1028   HCT 34.3 (L) 04/22/2024 1445   HCT 40.0 05/06/2014 1028   PLT 351 04/22/2024 1445   PLT 331 05/06/2014 1028   MCV 83.5 04/22/2024 1445   MCV 84 05/06/2014 1028   MCH 27.3 04/22/2024 1445   MCHC 32.7 04/22/2024 1445   RDW 13.9 04/22/2024 1445   RDW 13.4 05/06/2014 1028   LYMPHSABS 2.3 04/06/2024 0951   MONOABS 0.9 04/06/2024 0951   EOSABS 10 (L) 04/22/2024 1445   BASOSABS 10 04/22/2024 1445    Labs and imaging were reviewed.      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 Current therapy includes Symbicort , Spiriva , and albuterol . Recent respiratory failure prompted smoking cessation. Pulmonary function suggests early stage 4 or late stage 3. Focus on quality of life improvement. Recent admission in 04/2024 with COPD/Asthma exacerbation secondary to CAP - Ordered repeat pulmonary function test. - Initiated Ohtuvayre  nebulizer twice daily. - Initiate Tezepelumab - C/w Symbicort  and Spiriva  as prescribed.  - Referred to pulmonary rehabilitation. - Monitored oxygen saturation during walking.  Scheduled follow-up in 3 months.  #Obstructive sleep apnea Managed with CPAP and nocturnal oxygen therapy.  #History of lung cancer, status post radiation Lung cancer treated with radiation. Diagnosed in 2021 based on a hypermetabolic left lower lobe nodule. Not biopsy proven. Went directly for SBRT. C/b post left lower lobe radiation scarring which we are seeing on her CT chest.   - Follow-up CT chest in June 2026  #History of tobacco use Smoking since age 71, quit in July  Cessation expected to slow COPD progression.    RTC 3 months  I personally spent a total of 40 minutes in the care of the patient today including preparing to see the patient, getting/reviewing separately obtained history, performing a medically appropriate exam/evaluation, counseling and educating, placing orders, documenting clinical information in the EHR, independently interpreting results, and communicating results.   Darrin Barn, MD  Pulmonary Critical Care 04/30/2024 11:46 AM   "

## 2024-05-01 ENCOUNTER — Encounter: Payer: Self-pay | Admitting: Family Medicine

## 2024-05-01 DIAGNOSIS — J189 Pneumonia, unspecified organism: Secondary | ICD-10-CM

## 2024-05-01 MED ORDER — AMOXICILLIN-POT CLAVULANATE 400-57 MG/5ML PO SUSR: 875.0000 mg | Freq: Two times a day (BID) | ORAL | 0 refills | Status: AC

## 2024-05-04 ENCOUNTER — Telehealth: Payer: Self-pay

## 2024-05-04 ENCOUNTER — Other Ambulatory Visit (HOSPITAL_COMMUNITY): Payer: Self-pay

## 2024-05-04 NOTE — Telephone Encounter (Signed)
 Submitted a Prior Authorization request to [employer] for TEZSPIRE via CoverMyMeds. Authorization has been APPROVED from 04/02/24 to 04/01/25. Approval letter sent to scan center.   Test Claim revealed that a 28 day supply has a copay of $100. Patient can fill through [facility]: [phone]   Authorization#: [id number]   Will call pt to see if they are ok with copay

## 2024-05-19 ENCOUNTER — Encounter

## 2024-05-19 ENCOUNTER — Ambulatory Visit: Admitting: Pulmonary Disease

## 2024-05-22 ENCOUNTER — Inpatient Hospital Stay

## 2024-05-22 ENCOUNTER — Inpatient Hospital Stay: Admitting: Oncology

## 2024-07-20 ENCOUNTER — Inpatient Hospital Stay

## 2024-07-20 ENCOUNTER — Inpatient Hospital Stay: Admitting: Oncology

## 2024-08-05 ENCOUNTER — Ambulatory Visit: Admitting: Pulmonary Disease

## 2024-09-08 ENCOUNTER — Ambulatory Visit

## 2024-09-15 ENCOUNTER — Inpatient Hospital Stay

## 2024-09-15 ENCOUNTER — Inpatient Hospital Stay: Admitting: Oncology

## 2024-10-23 ENCOUNTER — Ambulatory Visit

## 2024-10-28 ENCOUNTER — Ambulatory Visit
